# Patient Record
Sex: Female | Born: 1975
Health system: Southern US, Community
[De-identification: ages and names within clinical notes are randomized; demographics above are authoritative.]

## PROBLEM LIST (undated history)

## (undated) ENCOUNTER — Ambulatory Visit (HOSPITAL_COMMUNITY): Disposition: A | Discharge status: Other Acute Inpatient Hospital | Payer: Medicaid Other

## (undated) ENCOUNTER — Inpatient Hospital Stay (HOSPITAL_COMMUNITY): Payer: BC Managed Care – PPO

## (undated) DIAGNOSIS — M25519 Pain in unspecified shoulder: Secondary | ICD-10-CM

## (undated) DIAGNOSIS — R259 Unspecified abnormal involuntary movements: Secondary | ICD-10-CM

## (undated) DIAGNOSIS — E119 Type 2 diabetes mellitus without complications: Secondary | ICD-10-CM

## (undated) DIAGNOSIS — M2550 Pain in unspecified joint: Secondary | ICD-10-CM

## (undated) DIAGNOSIS — E785 Hyperlipidemia, unspecified: Secondary | ICD-10-CM

## (undated) DIAGNOSIS — M25569 Pain in unspecified knee: Secondary | ICD-10-CM

## (undated) DIAGNOSIS — G894 Chronic pain syndrome: Secondary | ICD-10-CM

## (undated) DIAGNOSIS — M25579 Pain in unspecified ankle and joints of unspecified foot: Secondary | ICD-10-CM

## (undated) DIAGNOSIS — K5289 Other specified noninfective gastroenteritis and colitis: Secondary | ICD-10-CM

## (undated) DIAGNOSIS — IMO0001 Reserved for inherently not codable concepts without codable children: Secondary | ICD-10-CM

## (undated) DIAGNOSIS — IMO0002 Reserved for concepts with insufficient information to code with codable children: Secondary | ICD-10-CM

## (undated) DIAGNOSIS — R5383 Other fatigue: Secondary | ICD-10-CM

## (undated) DIAGNOSIS — R000 Tachycardia, unspecified: Secondary | ICD-10-CM

## (undated) DIAGNOSIS — G43009 Migraine without aura, not intractable, without status migrainosus: Secondary | ICD-10-CM

## (undated) DIAGNOSIS — M545 Low back pain: Secondary | ICD-10-CM

## (undated) DIAGNOSIS — J309 Allergic rhinitis, unspecified: Secondary | ICD-10-CM

## (undated) DIAGNOSIS — K219 Gastro-esophageal reflux disease without esophagitis: Secondary | ICD-10-CM

## (undated) DIAGNOSIS — R0602 Shortness of breath: Secondary | ICD-10-CM

## (undated) DIAGNOSIS — R51 Headache: Secondary | ICD-10-CM

## (undated) DIAGNOSIS — H9209 Otalgia, unspecified ear: Secondary | ICD-10-CM

## (undated) DIAGNOSIS — M25469 Effusion, unspecified knee: Secondary | ICD-10-CM

## (undated) DIAGNOSIS — D509 Iron deficiency anemia, unspecified: Secondary | ICD-10-CM

## (undated) DIAGNOSIS — F909 Attention-deficit hyperactivity disorder, unspecified type: Secondary | ICD-10-CM

## (undated) DIAGNOSIS — M5412 Radiculopathy, cervical region: Secondary | ICD-10-CM

## (undated) DIAGNOSIS — M542 Cervicalgia: Secondary | ICD-10-CM

## (undated) DIAGNOSIS — R5381 Other malaise: Secondary | ICD-10-CM

## (undated) DIAGNOSIS — R030 Elevated blood-pressure reading, without diagnosis of hypertension: Secondary | ICD-10-CM

## (undated) DIAGNOSIS — R609 Edema, unspecified: Secondary | ICD-10-CM

## (undated) DIAGNOSIS — J392 Other diseases of pharynx: Secondary | ICD-10-CM

## (undated) DIAGNOSIS — J019 Acute sinusitis, unspecified: Secondary | ICD-10-CM

## (undated) DIAGNOSIS — E041 Nontoxic single thyroid nodule: Secondary | ICD-10-CM

## (undated) DIAGNOSIS — T783XXA Angioneurotic edema, initial encounter: Secondary | ICD-10-CM

## (undated) DIAGNOSIS — F319 Bipolar disorder, unspecified: Secondary | ICD-10-CM

## (undated) DIAGNOSIS — R42 Dizziness and giddiness: Secondary | ICD-10-CM

## (undated) HISTORY — DX: Reserved for inherently not codable concepts without codable children: IMO0001

## (undated) HISTORY — DX: Shortness of breath: R06.02

## (undated) HISTORY — DX: Low back pain: M54.5

## (undated) HISTORY — DX: Angioneurotic edema, initial encounter: T78.3XXA

## (undated) HISTORY — PX: OTHER SURGICAL HISTORY: SHX169

## (undated) HISTORY — DX: Other diseases of pharynx: J39.2

## (undated) HISTORY — DX: Other fatigue: R53.83

## (undated) HISTORY — DX: Bipolar disorder, unspecified: F31.9

## (undated) HISTORY — DX: Other specified noninfective gastroenteritis and colitis: K52.89

## (undated) HISTORY — DX: Migraine without aura, not intractable, without status migrainosus: G43.009

## (undated) HISTORY — DX: Cervicalgia: M54.2

## (undated) HISTORY — DX: Pain in unspecified knee: M25.569

## (undated) HISTORY — DX: Otalgia, unspecified ear: H92.09

## (undated) HISTORY — DX: Unspecified abnormal involuntary movements: R25.9

## (undated) HISTORY — DX: Pain in unspecified ankle and joints of unspecified foot: M25.579

## (undated) HISTORY — DX: Acute sinusitis, unspecified: J01.90

## (undated) HISTORY — DX: Allergic rhinitis, unspecified: J30.9

## (undated) HISTORY — DX: Gastro-esophageal reflux disease without esophagitis: K21.9

## (undated) HISTORY — DX: Chronic pain syndrome: G89.4

## (undated) HISTORY — DX: Other malaise: R53.81

## (undated) HISTORY — DX: Elevated blood-pressure reading, without diagnosis of hypertension: R03.0

## (undated) HISTORY — PX: VAGINAL HYSTERECTOMY: SUR661

## (undated) HISTORY — DX: Iron deficiency anemia, unspecified: D50.9

## (undated) HISTORY — DX: Tachycardia, unspecified: R00.0

## (undated) HISTORY — DX: Headache: R51

## (undated) HISTORY — DX: Reserved for concepts with insufficient information to code with codable children: IMO0002

## (undated) HISTORY — DX: Pain in unspecified shoulder: M25.519

## (undated) HISTORY — DX: Pain in unspecified joint: M25.50

## (undated) HISTORY — DX: Edema, unspecified: R60.9

## (undated) HISTORY — DX: Radiculopathy, cervical region: M54.12

## (undated) HISTORY — DX: Nontoxic single thyroid nodule: E04.1

## (undated) HISTORY — DX: Effusion, unspecified knee: M25.469

## (undated) HISTORY — DX: Type 2 diabetes mellitus without complications: E11.9

## (undated) HISTORY — DX: Hyperlipidemia, unspecified: E78.5

---

## 1998-01-07 ENCOUNTER — Inpatient Hospital Stay (HOSPITAL_COMMUNITY): Admission: AD | Admit: 1998-01-07 | Discharge: 1998-01-07 | Payer: Self-pay | Admitting: Obstetrics and Gynecology

## 1998-01-10 ENCOUNTER — Inpatient Hospital Stay (HOSPITAL_COMMUNITY): Admission: AD | Admit: 1998-01-10 | Discharge: 1998-01-10 | Payer: Self-pay | Admitting: Obstetrics & Gynecology

## 1998-02-15 ENCOUNTER — Inpatient Hospital Stay (HOSPITAL_COMMUNITY): Admission: AD | Admit: 1998-02-15 | Discharge: 1998-02-15 | Payer: Self-pay | Admitting: Obstetrics & Gynecology

## 1998-02-20 ENCOUNTER — Inpatient Hospital Stay (HOSPITAL_COMMUNITY): Admission: AD | Admit: 1998-02-20 | Discharge: 1998-02-23 | Payer: Self-pay | Admitting: Obstetrics & Gynecology

## 1998-02-24 ENCOUNTER — Inpatient Hospital Stay (HOSPITAL_COMMUNITY): Admission: AD | Admit: 1998-02-24 | Discharge: 1998-02-24 | Payer: Self-pay | Admitting: Obstetrics and Gynecology

## 1998-03-13 ENCOUNTER — Inpatient Hospital Stay (HOSPITAL_COMMUNITY): Admission: AD | Admit: 1998-03-13 | Discharge: 1998-03-13 | Payer: Self-pay | Admitting: Obstetrics and Gynecology

## 1998-03-15 ENCOUNTER — Inpatient Hospital Stay (HOSPITAL_COMMUNITY): Admission: AD | Admit: 1998-03-15 | Discharge: 1998-03-21 | Payer: Self-pay | Admitting: Obstetrics & Gynecology

## 1998-03-24 ENCOUNTER — Other Ambulatory Visit: Admission: RE | Admit: 1998-03-24 | Discharge: 1998-03-24 | Payer: Self-pay | Admitting: Obstetrics & Gynecology

## 1998-05-15 ENCOUNTER — Inpatient Hospital Stay (HOSPITAL_COMMUNITY): Admission: AD | Admit: 1998-05-15 | Discharge: 1998-05-15 | Payer: Self-pay | Admitting: *Deleted

## 1998-06-02 ENCOUNTER — Inpatient Hospital Stay (HOSPITAL_COMMUNITY): Admission: AD | Admit: 1998-06-02 | Discharge: 1998-06-02 | Payer: Self-pay | Admitting: Obstetrics

## 1998-07-16 ENCOUNTER — Emergency Department (HOSPITAL_COMMUNITY): Admission: EM | Admit: 1998-07-16 | Discharge: 1998-07-16 | Payer: Self-pay | Admitting: Emergency Medicine

## 1998-10-12 ENCOUNTER — Inpatient Hospital Stay (HOSPITAL_COMMUNITY): Admission: AD | Admit: 1998-10-12 | Discharge: 1998-10-12 | Payer: Self-pay | Admitting: Obstetrics and Gynecology

## 1998-10-29 ENCOUNTER — Other Ambulatory Visit: Admission: RE | Admit: 1998-10-29 | Discharge: 1998-10-29 | Payer: Self-pay | Admitting: *Deleted

## 1999-11-26 ENCOUNTER — Emergency Department (HOSPITAL_COMMUNITY): Admission: EM | Admit: 1999-11-26 | Discharge: 1999-11-26 | Payer: Self-pay | Admitting: Emergency Medicine

## 1999-12-04 ENCOUNTER — Inpatient Hospital Stay (HOSPITAL_COMMUNITY): Admission: AD | Admit: 1999-12-04 | Discharge: 1999-12-04 | Payer: Self-pay | Admitting: Obstetrics

## 2000-02-21 ENCOUNTER — Emergency Department (HOSPITAL_COMMUNITY): Admission: EM | Admit: 2000-02-21 | Discharge: 2000-02-21 | Payer: Self-pay | Admitting: Emergency Medicine

## 2000-02-27 ENCOUNTER — Other Ambulatory Visit: Admission: RE | Admit: 2000-02-27 | Discharge: 2000-02-27 | Payer: Self-pay | Admitting: Obstetrics

## 2000-05-11 ENCOUNTER — Encounter (INDEPENDENT_AMBULATORY_CARE_PROVIDER_SITE_OTHER): Payer: Self-pay | Admitting: Specialist

## 2000-05-11 ENCOUNTER — Encounter: Payer: Self-pay | Admitting: Orthopedic Surgery

## 2000-05-11 ENCOUNTER — Observation Stay (HOSPITAL_COMMUNITY): Admission: RE | Admit: 2000-05-11 | Discharge: 2000-05-12 | Payer: Self-pay | Admitting: Orthopedic Surgery

## 2000-06-05 ENCOUNTER — Emergency Department (HOSPITAL_COMMUNITY): Admission: EM | Admit: 2000-06-05 | Discharge: 2000-06-05 | Payer: Self-pay | Admitting: Emergency Medicine

## 2001-02-27 ENCOUNTER — Emergency Department (HOSPITAL_COMMUNITY): Admission: EM | Admit: 2001-02-27 | Discharge: 2001-02-27 | Payer: Self-pay | Admitting: Emergency Medicine

## 2001-03-22 ENCOUNTER — Other Ambulatory Visit: Admission: RE | Admit: 2001-03-22 | Discharge: 2001-03-22 | Payer: Self-pay | Admitting: Obstetrics and Gynecology

## 2001-04-23 ENCOUNTER — Encounter (INDEPENDENT_AMBULATORY_CARE_PROVIDER_SITE_OTHER): Payer: Self-pay | Admitting: Specialist

## 2001-04-23 ENCOUNTER — Other Ambulatory Visit: Admission: RE | Admit: 2001-04-23 | Discharge: 2001-04-23 | Payer: Self-pay | Admitting: Obstetrics and Gynecology

## 2001-05-19 ENCOUNTER — Emergency Department (HOSPITAL_COMMUNITY): Admission: EM | Admit: 2001-05-19 | Discharge: 2001-05-19 | Payer: Self-pay | Admitting: Emergency Medicine

## 2001-08-22 ENCOUNTER — Encounter: Payer: Self-pay | Admitting: Obstetrics and Gynecology

## 2001-08-22 ENCOUNTER — Ambulatory Visit (HOSPITAL_COMMUNITY): Admission: RE | Admit: 2001-08-22 | Discharge: 2001-08-22 | Payer: Self-pay | Admitting: Obstetrics and Gynecology

## 2001-10-21 ENCOUNTER — Inpatient Hospital Stay (HOSPITAL_COMMUNITY): Admission: AD | Admit: 2001-10-21 | Discharge: 2001-10-21 | Payer: Self-pay | Admitting: Obstetrics and Gynecology

## 2001-10-24 ENCOUNTER — Other Ambulatory Visit: Admission: RE | Admit: 2001-10-24 | Discharge: 2001-10-24 | Payer: Self-pay | Admitting: Obstetrics and Gynecology

## 2001-11-06 ENCOUNTER — Inpatient Hospital Stay (HOSPITAL_COMMUNITY): Admission: AD | Admit: 2001-11-06 | Discharge: 2001-11-06 | Payer: Self-pay | Admitting: Obstetrics and Gynecology

## 2001-11-21 ENCOUNTER — Encounter (HOSPITAL_COMMUNITY): Admission: RE | Admit: 2001-11-21 | Discharge: 2001-12-03 | Payer: Self-pay | Admitting: Obstetrics and Gynecology

## 2001-11-25 ENCOUNTER — Encounter (INDEPENDENT_AMBULATORY_CARE_PROVIDER_SITE_OTHER): Payer: Self-pay | Admitting: Specialist

## 2001-11-25 ENCOUNTER — Inpatient Hospital Stay (HOSPITAL_COMMUNITY): Admission: AD | Admit: 2001-11-25 | Discharge: 2001-12-02 | Payer: Self-pay | Admitting: Obstetrics and Gynecology

## 2001-11-26 ENCOUNTER — Encounter: Payer: Self-pay | Admitting: Obstetrics and Gynecology

## 2001-12-03 ENCOUNTER — Encounter: Admission: RE | Admit: 2001-12-03 | Discharge: 2002-01-02 | Payer: Self-pay | Admitting: Obstetrics and Gynecology

## 2002-01-14 ENCOUNTER — Other Ambulatory Visit: Admission: RE | Admit: 2002-01-14 | Discharge: 2002-01-14 | Payer: Self-pay | Admitting: Obstetrics and Gynecology

## 2002-05-01 ENCOUNTER — Emergency Department (HOSPITAL_COMMUNITY): Admission: EM | Admit: 2002-05-01 | Discharge: 2002-05-01 | Payer: Self-pay | Admitting: Emergency Medicine

## 2002-07-17 ENCOUNTER — Inpatient Hospital Stay (HOSPITAL_COMMUNITY): Admission: AD | Admit: 2002-07-17 | Discharge: 2002-07-17 | Payer: Self-pay | Admitting: *Deleted

## 2002-07-17 ENCOUNTER — Encounter: Payer: Self-pay | Admitting: Obstetrics and Gynecology

## 2002-09-26 ENCOUNTER — Emergency Department (HOSPITAL_COMMUNITY): Admission: EM | Admit: 2002-09-26 | Discharge: 2002-09-26 | Payer: Self-pay | Admitting: Emergency Medicine

## 2002-10-20 ENCOUNTER — Encounter: Payer: Self-pay | Admitting: Obstetrics and Gynecology

## 2002-10-20 ENCOUNTER — Ambulatory Visit (HOSPITAL_COMMUNITY): Admission: RE | Admit: 2002-10-20 | Discharge: 2002-10-20 | Payer: Self-pay | Admitting: Obstetrics and Gynecology

## 2003-02-13 ENCOUNTER — Inpatient Hospital Stay (HOSPITAL_COMMUNITY): Admission: AD | Admit: 2003-02-13 | Discharge: 2003-02-13 | Payer: Self-pay | Admitting: Obstetrics and Gynecology

## 2003-02-17 ENCOUNTER — Inpatient Hospital Stay (HOSPITAL_COMMUNITY): Admission: AD | Admit: 2003-02-17 | Discharge: 2003-02-17 | Payer: Self-pay | Admitting: Obstetrics and Gynecology

## 2003-02-23 ENCOUNTER — Inpatient Hospital Stay (HOSPITAL_COMMUNITY): Admission: AD | Admit: 2003-02-23 | Discharge: 2003-02-23 | Payer: Self-pay | Admitting: Obstetrics and Gynecology

## 2003-03-04 ENCOUNTER — Inpatient Hospital Stay (HOSPITAL_COMMUNITY): Admission: AD | Admit: 2003-03-04 | Discharge: 2003-03-07 | Payer: Self-pay | Admitting: Obstetrics and Gynecology

## 2003-06-26 ENCOUNTER — Other Ambulatory Visit: Admission: RE | Admit: 2003-06-26 | Discharge: 2003-06-26 | Payer: Self-pay | Admitting: Obstetrics and Gynecology

## 2003-09-04 ENCOUNTER — Emergency Department (HOSPITAL_COMMUNITY): Admission: EM | Admit: 2003-09-04 | Discharge: 2003-09-04 | Payer: Self-pay | Admitting: Emergency Medicine

## 2003-10-28 ENCOUNTER — Encounter: Admission: RE | Admit: 2003-10-28 | Discharge: 2004-01-26 | Payer: Self-pay | Admitting: Internal Medicine

## 2003-11-19 ENCOUNTER — Emergency Department (HOSPITAL_COMMUNITY): Admission: EM | Admit: 2003-11-19 | Discharge: 2003-11-19 | Payer: Self-pay | Admitting: Emergency Medicine

## 2004-01-22 ENCOUNTER — Encounter: Admission: RE | Admit: 2004-01-22 | Discharge: 2004-04-21 | Payer: Self-pay | Admitting: Internal Medicine

## 2004-02-12 ENCOUNTER — Emergency Department (HOSPITAL_COMMUNITY): Admission: EM | Admit: 2004-02-12 | Discharge: 2004-02-12 | Payer: Self-pay | Admitting: Emergency Medicine

## 2004-03-17 ENCOUNTER — Observation Stay (HOSPITAL_COMMUNITY): Admission: RE | Admit: 2004-03-17 | Discharge: 2004-03-18 | Payer: Self-pay | Admitting: Obstetrics and Gynecology

## 2004-03-17 ENCOUNTER — Encounter (INDEPENDENT_AMBULATORY_CARE_PROVIDER_SITE_OTHER): Payer: Self-pay | Admitting: *Deleted

## 2004-06-17 ENCOUNTER — Encounter: Admission: RE | Admit: 2004-06-17 | Discharge: 2004-09-15 | Payer: Self-pay | Admitting: Orthopedic Surgery

## 2004-06-28 ENCOUNTER — Emergency Department (HOSPITAL_COMMUNITY): Admission: EM | Admit: 2004-06-28 | Discharge: 2004-06-28 | Payer: Self-pay | Admitting: Emergency Medicine

## 2004-08-19 ENCOUNTER — Encounter: Admission: RE | Admit: 2004-08-19 | Discharge: 2004-08-19 | Payer: Self-pay | Admitting: Rheumatology

## 2005-05-13 ENCOUNTER — Inpatient Hospital Stay (HOSPITAL_COMMUNITY): Admission: AD | Admit: 2005-05-13 | Discharge: 2005-05-14 | Payer: Self-pay | Admitting: Obstetrics and Gynecology

## 2005-05-29 ENCOUNTER — Ambulatory Visit (HOSPITAL_COMMUNITY): Payer: Self-pay | Admitting: Psychiatry

## 2005-06-14 ENCOUNTER — Ambulatory Visit (HOSPITAL_COMMUNITY): Payer: Self-pay | Admitting: Psychiatry

## 2005-07-24 ENCOUNTER — Emergency Department (HOSPITAL_COMMUNITY): Admission: EM | Admit: 2005-07-24 | Discharge: 2005-07-24 | Payer: Self-pay | Admitting: Emergency Medicine

## 2005-08-07 ENCOUNTER — Ambulatory Visit: Payer: Self-pay | Admitting: Internal Medicine

## 2005-08-14 ENCOUNTER — Ambulatory Visit (HOSPITAL_COMMUNITY): Payer: Self-pay | Admitting: Psychiatry

## 2005-09-01 ENCOUNTER — Ambulatory Visit: Payer: Self-pay

## 2005-09-02 ENCOUNTER — Emergency Department (HOSPITAL_COMMUNITY): Admission: EM | Admit: 2005-09-02 | Discharge: 2005-09-02 | Payer: Self-pay | Admitting: Emergency Medicine

## 2005-09-08 ENCOUNTER — Ambulatory Visit: Payer: Self-pay | Admitting: Internal Medicine

## 2005-09-18 ENCOUNTER — Ambulatory Visit: Payer: Self-pay | Admitting: Internal Medicine

## 2005-09-29 ENCOUNTER — Ambulatory Visit (HOSPITAL_COMMUNITY): Payer: Self-pay | Admitting: Psychiatry

## 2005-10-30 ENCOUNTER — Ambulatory Visit (HOSPITAL_COMMUNITY): Payer: Self-pay | Admitting: Psychiatry

## 2005-12-13 ENCOUNTER — Ambulatory Visit: Payer: Self-pay | Admitting: Internal Medicine

## 2005-12-18 ENCOUNTER — Ambulatory Visit (HOSPITAL_COMMUNITY): Payer: Self-pay | Admitting: Psychiatry

## 2005-12-21 ENCOUNTER — Ambulatory Visit: Payer: Self-pay | Admitting: Pulmonary Disease

## 2005-12-25 ENCOUNTER — Ambulatory Visit: Payer: Self-pay | Admitting: Internal Medicine

## 2006-01-04 ENCOUNTER — Ambulatory Visit: Payer: Self-pay | Admitting: Pulmonary Disease

## 2006-01-10 ENCOUNTER — Ambulatory Visit (HOSPITAL_COMMUNITY): Payer: Self-pay | Admitting: Psychiatry

## 2006-01-17 ENCOUNTER — Ambulatory Visit (HOSPITAL_COMMUNITY): Payer: Self-pay | Admitting: Licensed Clinical Social Worker

## 2006-01-18 ENCOUNTER — Ambulatory Visit (HOSPITAL_BASED_OUTPATIENT_CLINIC_OR_DEPARTMENT_OTHER): Admission: RE | Admit: 2006-01-18 | Discharge: 2006-01-18 | Payer: Self-pay | Admitting: Pulmonary Disease

## 2006-01-23 ENCOUNTER — Ambulatory Visit: Payer: Self-pay | Admitting: Internal Medicine

## 2006-01-24 ENCOUNTER — Ambulatory Visit: Payer: Self-pay | Admitting: Internal Medicine

## 2006-01-30 ENCOUNTER — Ambulatory Visit: Payer: Self-pay | Admitting: Pulmonary Disease

## 2006-02-05 ENCOUNTER — Ambulatory Visit: Payer: Self-pay | Admitting: *Deleted

## 2006-02-05 ENCOUNTER — Ambulatory Visit: Payer: Self-pay | Admitting: Pulmonary Disease

## 2006-02-16 ENCOUNTER — Ambulatory Visit (HOSPITAL_COMMUNITY): Payer: Self-pay | Admitting: Psychiatry

## 2006-03-30 ENCOUNTER — Ambulatory Visit (HOSPITAL_COMMUNITY): Payer: Self-pay | Admitting: Psychiatry

## 2006-11-28 ENCOUNTER — Ambulatory Visit: Payer: Self-pay | Admitting: Internal Medicine

## 2006-12-03 ENCOUNTER — Encounter: Admission: RE | Admit: 2006-12-03 | Discharge: 2006-12-03 | Payer: Self-pay | Admitting: Internal Medicine

## 2006-12-07 ENCOUNTER — Ambulatory Visit: Payer: Self-pay | Admitting: Internal Medicine

## 2006-12-07 ENCOUNTER — Ambulatory Visit (HOSPITAL_COMMUNITY): Payer: Self-pay | Admitting: Psychiatry

## 2006-12-09 ENCOUNTER — Encounter: Admission: RE | Admit: 2006-12-09 | Discharge: 2006-12-09 | Payer: Self-pay | Admitting: Obstetrics & Gynecology

## 2006-12-14 ENCOUNTER — Ambulatory Visit: Payer: Self-pay | Admitting: Internal Medicine

## 2006-12-17 ENCOUNTER — Encounter: Payer: Self-pay | Admitting: Internal Medicine

## 2006-12-17 LAB — CONVERTED CEMR LAB
ALT: 22 units/L (ref 0–40)
AST: 23 units/L (ref 0–37)
Albumin: 3.8 g/dL (ref 3.5–5.2)
Alkaline Phosphatase: 79 units/L (ref 39–117)
Anti Nuclear Antibody(ANA): NEGATIVE
BUN: 10 mg/dL (ref 6–23)
Basophils Absolute: 0 10*3/uL (ref 0.0–0.1)
Basophils Relative: 0.8 % (ref 0.0–1.0)
Bilirubin, Direct: 0.1 mg/dL (ref 0.0–0.3)
CO2: 23 meq/L (ref 19–32)
Calcium: 9 mg/dL (ref 8.4–10.5)
Chloride: 109 meq/L (ref 96–112)
Cholesterol: 241 mg/dL (ref 0–200)
Creatinine, Ser: 0.6 mg/dL (ref 0.4–1.2)
Direct LDL: 181.5 mg/dL
Eosinophils Absolute: 0.1 10*3/uL (ref 0.0–0.6)
Eosinophils Relative: 1.6 % (ref 0.0–5.0)
Folate: 9.1 ng/mL
GFR calc Af Amer: 149 mL/min
GFR calc non Af Amer: 123 mL/min
Glucose, Bld: 169 mg/dL — ABNORMAL HIGH (ref 70–99)
HCT: 37 % (ref 36.0–46.0)
HDL: 45.1 mg/dL (ref >39.0)
Hemoglobin: 12 g/dL (ref 12.0–15.0)
Hgb A1c MFr Bld: 8.6 % — ABNORMAL HIGH (ref 4.6–6.0)
Lymphocytes Relative: 33.8 % (ref 12.0–46.0)
MCHC: 32.6 g/dL (ref 30.0–36.0)
MCV: 83.2 fL (ref 78.0–100.0)
Monocytes Absolute: 0.4 10*3/uL (ref 0.2–0.7)
Monocytes Relative: 8.7 % (ref 3.0–11.0)
Neutro Abs: 2.8 10*3/uL (ref 1.4–7.7)
Neutrophils Relative %: 55.1 % (ref 43.0–77.0)
Platelets: 266 10*3/uL (ref 150–400)
Potassium: 3.9 meq/L (ref 3.5–5.1)
RBC: 4.44 M/uL (ref 3.87–5.11)
RDW: 13.3 % (ref 11.5–14.6)
Rhuematoid fact SerPl-aCnc: 34.3 intl units/mL — ABNORMAL HIGH (ref 0.0–20.0)
Sed Rate: 18 mm/hr (ref 0–25)
Sodium: 140 meq/L (ref 135–145)
TSH: 1.43 microintl units/mL (ref 0.35–5.50)
Total Bilirubin: 0.8 mg/dL (ref 0.3–1.2)
Total CHOL/HDL Ratio: 5.3
Total Protein: 7.4 g/dL (ref 6.0–8.3)
Triglycerides: 120 mg/dL (ref 0–149)
VLDL: 24 mg/dL (ref 0–40)
Vitamin B-12: 477 pg/mL (ref 211–911)
WBC: 5 10*3/uL (ref 4.5–10.5)

## 2006-12-21 ENCOUNTER — Ambulatory Visit: Payer: Self-pay | Admitting: Internal Medicine

## 2006-12-23 ENCOUNTER — Emergency Department (HOSPITAL_COMMUNITY): Admission: EM | Admit: 2006-12-23 | Discharge: 2006-12-23 | Payer: Self-pay | Admitting: Family Medicine

## 2007-01-04 ENCOUNTER — Ambulatory Visit: Payer: Self-pay | Admitting: Internal Medicine

## 2007-01-11 ENCOUNTER — Ambulatory Visit (HOSPITAL_COMMUNITY): Payer: Self-pay | Admitting: Psychiatry

## 2007-02-04 ENCOUNTER — Ambulatory Visit: Payer: Self-pay | Admitting: Internal Medicine

## 2007-02-15 ENCOUNTER — Encounter: Admission: RE | Admit: 2007-02-15 | Discharge: 2007-02-15 | Payer: Self-pay | Admitting: Internal Medicine

## 2007-02-15 ENCOUNTER — Ambulatory Visit: Payer: Self-pay | Admitting: Internal Medicine

## 2007-02-21 HISTORY — PX: OTHER SURGICAL HISTORY: SHX169

## 2007-02-25 ENCOUNTER — Ambulatory Visit: Payer: Self-pay | Admitting: Internal Medicine

## 2007-02-28 ENCOUNTER — Encounter (INDEPENDENT_AMBULATORY_CARE_PROVIDER_SITE_OTHER): Payer: Self-pay | Admitting: Specialist

## 2007-02-28 ENCOUNTER — Other Ambulatory Visit: Admission: RE | Admit: 2007-02-28 | Discharge: 2007-02-28 | Payer: Self-pay | Admitting: Interventional Radiology

## 2007-02-28 ENCOUNTER — Encounter: Admission: RE | Admit: 2007-02-28 | Discharge: 2007-02-28 | Payer: Self-pay | Admitting: Internal Medicine

## 2007-03-04 ENCOUNTER — Emergency Department (HOSPITAL_COMMUNITY): Admission: EM | Admit: 2007-03-04 | Discharge: 2007-03-04 | Payer: Self-pay | Admitting: Emergency Medicine

## 2007-04-23 ENCOUNTER — Encounter
Admission: RE | Admit: 2007-04-23 | Discharge: 2007-07-22 | Payer: Self-pay | Admitting: Physical Medicine & Rehabilitation

## 2007-04-24 ENCOUNTER — Ambulatory Visit (HOSPITAL_COMMUNITY): Payer: Self-pay | Admitting: Psychiatry

## 2007-05-16 ENCOUNTER — Ambulatory Visit: Payer: Self-pay | Admitting: Endocrinology

## 2007-06-03 ENCOUNTER — Ambulatory Visit (HOSPITAL_COMMUNITY): Admission: RE | Admit: 2007-06-03 | Discharge: 2007-06-03 | Payer: Self-pay | Admitting: Endocrinology

## 2007-06-05 ENCOUNTER — Encounter: Payer: Self-pay | Admitting: *Deleted

## 2007-06-05 DIAGNOSIS — E119 Type 2 diabetes mellitus without complications: Secondary | ICD-10-CM

## 2007-06-05 HISTORY — DX: Type 2 diabetes mellitus without complications: E11.9

## 2007-06-18 ENCOUNTER — Ambulatory Visit: Payer: Self-pay | Admitting: Physical Medicine & Rehabilitation

## 2007-07-19 ENCOUNTER — Ambulatory Visit: Payer: Self-pay | Admitting: Internal Medicine

## 2007-07-24 ENCOUNTER — Emergency Department (HOSPITAL_COMMUNITY): Admission: EM | Admit: 2007-07-24 | Discharge: 2007-07-24 | Payer: Self-pay | Admitting: Emergency Medicine

## 2007-07-26 ENCOUNTER — Encounter: Admission: RE | Admit: 2007-07-26 | Discharge: 2007-07-26 | Payer: Self-pay | Admitting: Internal Medicine

## 2007-07-26 ENCOUNTER — Emergency Department (HOSPITAL_COMMUNITY): Admission: EM | Admit: 2007-07-26 | Discharge: 2007-07-26 | Payer: Self-pay | Admitting: Family Medicine

## 2007-08-15 ENCOUNTER — Encounter: Payer: Self-pay | Admitting: Internal Medicine

## 2007-08-16 ENCOUNTER — Encounter: Payer: Self-pay | Admitting: Internal Medicine

## 2007-09-13 ENCOUNTER — Ambulatory Visit: Payer: Self-pay | Admitting: Internal Medicine

## 2007-09-13 DIAGNOSIS — E785 Hyperlipidemia, unspecified: Secondary | ICD-10-CM

## 2007-09-13 DIAGNOSIS — F319 Bipolar disorder, unspecified: Secondary | ICD-10-CM

## 2007-09-13 DIAGNOSIS — R51 Headache: Secondary | ICD-10-CM | POA: Insufficient documentation

## 2007-09-13 DIAGNOSIS — E1169 Type 2 diabetes mellitus with other specified complication: Secondary | ICD-10-CM | POA: Insufficient documentation

## 2007-09-13 DIAGNOSIS — R519 Headache, unspecified: Secondary | ICD-10-CM | POA: Insufficient documentation

## 2007-09-13 HISTORY — DX: Headache: R51

## 2007-09-13 HISTORY — DX: Bipolar disorder, unspecified: F31.9

## 2007-09-13 HISTORY — DX: Hyperlipidemia, unspecified: E78.5

## 2007-09-23 ENCOUNTER — Ambulatory Visit: Payer: Self-pay | Admitting: Internal Medicine

## 2007-09-24 ENCOUNTER — Ambulatory Visit: Payer: Self-pay | Admitting: Physical Medicine & Rehabilitation

## 2007-09-24 ENCOUNTER — Encounter
Admission: RE | Admit: 2007-09-24 | Discharge: 2007-09-24 | Payer: Self-pay | Admitting: Physical Medicine & Rehabilitation

## 2007-09-25 ENCOUNTER — Telehealth: Payer: Self-pay | Admitting: Internal Medicine

## 2007-10-08 ENCOUNTER — Emergency Department (HOSPITAL_COMMUNITY): Admission: EM | Admit: 2007-10-08 | Discharge: 2007-10-08 | Payer: Self-pay | Admitting: Emergency Medicine

## 2007-10-09 ENCOUNTER — Telehealth: Payer: Self-pay | Admitting: Internal Medicine

## 2007-10-15 ENCOUNTER — Ambulatory Visit: Payer: Self-pay | Admitting: Internal Medicine

## 2007-10-15 DIAGNOSIS — M545 Low back pain, unspecified: Secondary | ICD-10-CM | POA: Insufficient documentation

## 2007-10-15 DIAGNOSIS — M542 Cervicalgia: Secondary | ICD-10-CM

## 2007-10-15 DIAGNOSIS — M79609 Pain in unspecified limb: Secondary | ICD-10-CM | POA: Insufficient documentation

## 2007-10-15 HISTORY — DX: Cervicalgia: M54.2

## 2007-10-15 HISTORY — DX: Low back pain, unspecified: M54.50

## 2007-11-22 ENCOUNTER — Encounter: Payer: Self-pay | Admitting: Internal Medicine

## 2007-11-24 ENCOUNTER — Encounter: Admission: RE | Admit: 2007-11-24 | Discharge: 2007-11-24 | Payer: Self-pay | Admitting: Orthopedic Surgery

## 2007-12-05 ENCOUNTER — Ambulatory Visit: Payer: Self-pay | Admitting: Internal Medicine

## 2007-12-05 DIAGNOSIS — R0602 Shortness of breath: Secondary | ICD-10-CM

## 2007-12-05 DIAGNOSIS — R000 Tachycardia, unspecified: Secondary | ICD-10-CM | POA: Insufficient documentation

## 2007-12-05 DIAGNOSIS — H9209 Otalgia, unspecified ear: Secondary | ICD-10-CM | POA: Insufficient documentation

## 2007-12-05 DIAGNOSIS — R5383 Other fatigue: Secondary | ICD-10-CM | POA: Insufficient documentation

## 2007-12-05 DIAGNOSIS — R5381 Other malaise: Secondary | ICD-10-CM

## 2007-12-05 HISTORY — DX: Tachycardia, unspecified: R00.0

## 2007-12-05 HISTORY — DX: Other fatigue: R53.83

## 2007-12-05 HISTORY — DX: Otalgia, unspecified ear: H92.09

## 2007-12-05 HISTORY — DX: Shortness of breath: R06.02

## 2007-12-05 HISTORY — DX: Other malaise: R53.81

## 2007-12-06 ENCOUNTER — Emergency Department (HOSPITAL_COMMUNITY): Admission: EM | Admit: 2007-12-06 | Discharge: 2007-12-06 | Payer: Self-pay | Admitting: Family Medicine

## 2007-12-11 ENCOUNTER — Emergency Department (HOSPITAL_COMMUNITY): Admission: EM | Admit: 2007-12-11 | Discharge: 2007-12-11 | Payer: Self-pay | Admitting: Emergency Medicine

## 2007-12-12 ENCOUNTER — Encounter: Payer: Self-pay | Admitting: Internal Medicine

## 2007-12-18 ENCOUNTER — Ambulatory Visit: Payer: Self-pay | Admitting: Cardiology

## 2007-12-18 ENCOUNTER — Encounter: Payer: Self-pay | Admitting: Internal Medicine

## 2007-12-18 ENCOUNTER — Ambulatory Visit: Payer: Self-pay

## 2008-01-02 ENCOUNTER — Ambulatory Visit: Payer: Self-pay | Admitting: Internal Medicine

## 2008-01-02 DIAGNOSIS — R259 Unspecified abnormal involuntary movements: Secondary | ICD-10-CM

## 2008-01-02 HISTORY — DX: Unspecified abnormal involuntary movements: R25.9

## 2008-01-03 ENCOUNTER — Ambulatory Visit: Payer: Self-pay | Admitting: Physical Medicine & Rehabilitation

## 2008-01-03 ENCOUNTER — Encounter
Admission: RE | Admit: 2008-01-03 | Discharge: 2008-01-06 | Payer: Self-pay | Admitting: Physical Medicine & Rehabilitation

## 2008-01-09 ENCOUNTER — Encounter: Admission: RE | Admit: 2008-01-09 | Discharge: 2008-01-09 | Payer: Self-pay | Admitting: Orthopedic Surgery

## 2008-01-12 ENCOUNTER — Encounter: Admission: RE | Admit: 2008-01-12 | Discharge: 2008-01-12 | Payer: Self-pay | Admitting: Orthopedic Surgery

## 2008-01-13 ENCOUNTER — Encounter: Payer: Self-pay | Admitting: Pulmonary Disease

## 2008-01-16 ENCOUNTER — Encounter: Admission: RE | Admit: 2008-01-16 | Discharge: 2008-01-16 | Payer: Self-pay | Admitting: Internal Medicine

## 2008-01-17 ENCOUNTER — Encounter: Payer: Self-pay | Admitting: Internal Medicine

## 2008-01-30 ENCOUNTER — Encounter: Payer: Self-pay | Admitting: Internal Medicine

## 2008-02-03 ENCOUNTER — Ambulatory Visit (HOSPITAL_COMMUNITY): Payer: Self-pay | Admitting: Psychiatry

## 2008-02-18 ENCOUNTER — Ambulatory Visit: Payer: Self-pay | Admitting: Cardiology

## 2008-02-27 ENCOUNTER — Encounter: Payer: Self-pay | Admitting: Internal Medicine

## 2008-03-12 ENCOUNTER — Encounter: Admission: RE | Admit: 2008-03-12 | Discharge: 2008-03-13 | Payer: Self-pay | Admitting: Orthopedic Surgery

## 2008-05-09 ENCOUNTER — Emergency Department (HOSPITAL_COMMUNITY): Admission: EM | Admit: 2008-05-09 | Discharge: 2008-05-09 | Payer: Self-pay | Admitting: Emergency Medicine

## 2008-06-11 ENCOUNTER — Emergency Department (HOSPITAL_COMMUNITY): Admission: EM | Admit: 2008-06-11 | Discharge: 2008-06-11 | Payer: Self-pay | Admitting: Emergency Medicine

## 2008-06-19 ENCOUNTER — Encounter
Admission: RE | Admit: 2008-06-19 | Discharge: 2008-06-19 | Payer: Self-pay | Admitting: Physical Medicine & Rehabilitation

## 2008-06-26 ENCOUNTER — Ambulatory Visit: Payer: Self-pay | Admitting: Internal Medicine

## 2008-06-26 DIAGNOSIS — IMO0001 Reserved for inherently not codable concepts without codable children: Secondary | ICD-10-CM

## 2008-06-26 DIAGNOSIS — K219 Gastro-esophageal reflux disease without esophagitis: Secondary | ICD-10-CM | POA: Insufficient documentation

## 2008-06-26 DIAGNOSIS — G43009 Migraine without aura, not intractable, without status migrainosus: Secondary | ICD-10-CM

## 2008-06-26 DIAGNOSIS — IMO0002 Reserved for concepts with insufficient information to code with codable children: Secondary | ICD-10-CM | POA: Insufficient documentation

## 2008-06-26 DIAGNOSIS — D509 Iron deficiency anemia, unspecified: Secondary | ICD-10-CM

## 2008-06-26 DIAGNOSIS — J309 Allergic rhinitis, unspecified: Secondary | ICD-10-CM

## 2008-06-26 DIAGNOSIS — M5412 Radiculopathy, cervical region: Secondary | ICD-10-CM | POA: Insufficient documentation

## 2008-06-26 DIAGNOSIS — M25579 Pain in unspecified ankle and joints of unspecified foot: Secondary | ICD-10-CM

## 2008-06-26 DIAGNOSIS — M797 Fibromyalgia: Secondary | ICD-10-CM

## 2008-06-26 HISTORY — DX: Reserved for inherently not codable concepts without codable children: IMO0001

## 2008-06-26 HISTORY — DX: Iron deficiency anemia, unspecified: D50.9

## 2008-06-26 HISTORY — DX: Pain in unspecified ankle and joints of unspecified foot: M25.579

## 2008-06-26 HISTORY — DX: Fibromyalgia: M79.7

## 2008-06-26 HISTORY — DX: Radiculopathy, cervical region: M54.12

## 2008-06-26 HISTORY — DX: Reserved for concepts with insufficient information to code with codable children: IMO0002

## 2008-06-26 HISTORY — DX: Migraine without aura, not intractable, without status migrainosus: G43.009

## 2008-06-26 HISTORY — DX: Gastro-esophageal reflux disease without esophagitis: K21.9

## 2008-06-26 HISTORY — DX: Allergic rhinitis, unspecified: J30.9

## 2008-07-01 ENCOUNTER — Emergency Department (HOSPITAL_COMMUNITY): Admission: EM | Admit: 2008-07-01 | Discharge: 2008-07-01 | Payer: Self-pay | Admitting: Emergency Medicine

## 2008-07-03 ENCOUNTER — Ambulatory Visit: Payer: Self-pay | Admitting: Internal Medicine

## 2008-07-03 DIAGNOSIS — J392 Other diseases of pharynx: Secondary | ICD-10-CM

## 2008-07-03 DIAGNOSIS — M25519 Pain in unspecified shoulder: Secondary | ICD-10-CM

## 2008-07-03 HISTORY — DX: Other diseases of pharynx: J39.2

## 2008-07-03 HISTORY — DX: Pain in unspecified shoulder: M25.519

## 2008-07-05 ENCOUNTER — Encounter: Admission: RE | Admit: 2008-07-05 | Discharge: 2008-07-05 | Payer: Self-pay | Admitting: Internal Medicine

## 2008-11-20 ENCOUNTER — Ambulatory Visit: Payer: Self-pay | Admitting: Endocrinology

## 2008-11-20 DIAGNOSIS — E041 Nontoxic single thyroid nodule: Secondary | ICD-10-CM

## 2008-11-20 HISTORY — DX: Nontoxic single thyroid nodule: E04.1

## 2008-11-20 LAB — CONVERTED CEMR LAB: TSH: 1.41 microintl units/mL (ref 0.35–5.50)

## 2008-11-23 ENCOUNTER — Telehealth (INDEPENDENT_AMBULATORY_CARE_PROVIDER_SITE_OTHER): Payer: Self-pay | Admitting: *Deleted

## 2008-11-23 DIAGNOSIS — R1310 Dysphagia, unspecified: Secondary | ICD-10-CM | POA: Insufficient documentation

## 2008-11-25 ENCOUNTER — Encounter: Admission: RE | Admit: 2008-11-25 | Discharge: 2008-11-25 | Payer: Self-pay | Admitting: Endocrinology

## 2008-12-01 ENCOUNTER — Ambulatory Visit (HOSPITAL_COMMUNITY): Admission: RE | Admit: 2008-12-01 | Discharge: 2008-12-01 | Payer: Self-pay | Admitting: Endocrinology

## 2008-12-09 ENCOUNTER — Encounter: Payer: Self-pay | Admitting: Internal Medicine

## 2008-12-15 ENCOUNTER — Ambulatory Visit: Payer: Self-pay | Admitting: Internal Medicine

## 2008-12-15 DIAGNOSIS — K5289 Other specified noninfective gastroenteritis and colitis: Secondary | ICD-10-CM | POA: Insufficient documentation

## 2008-12-15 HISTORY — DX: Other specified noninfective gastroenteritis and colitis: K52.89

## 2008-12-16 ENCOUNTER — Telehealth: Payer: Self-pay | Admitting: Internal Medicine

## 2008-12-16 ENCOUNTER — Telehealth (INDEPENDENT_AMBULATORY_CARE_PROVIDER_SITE_OTHER): Payer: Self-pay | Admitting: *Deleted

## 2009-01-06 ENCOUNTER — Ambulatory Visit: Payer: Self-pay | Admitting: Internal Medicine

## 2009-01-06 ENCOUNTER — Ambulatory Visit: Payer: Self-pay | Admitting: Cardiology

## 2009-01-06 DIAGNOSIS — R609 Edema, unspecified: Secondary | ICD-10-CM | POA: Insufficient documentation

## 2009-01-06 HISTORY — DX: Edema, unspecified: R60.9

## 2009-01-08 LAB — CONVERTED CEMR LAB: Hgb A1c MFr Bld: 10 % — ABNORMAL HIGH (ref 4.6–6.5)

## 2009-04-01 ENCOUNTER — Emergency Department (HOSPITAL_COMMUNITY): Admission: EM | Admit: 2009-04-01 | Discharge: 2009-04-01 | Payer: Self-pay | Admitting: Emergency Medicine

## 2009-04-14 ENCOUNTER — Emergency Department (HOSPITAL_COMMUNITY): Admission: EM | Admit: 2009-04-14 | Discharge: 2009-04-14 | Payer: Self-pay | Admitting: Emergency Medicine

## 2009-04-14 ENCOUNTER — Telehealth (INDEPENDENT_AMBULATORY_CARE_PROVIDER_SITE_OTHER): Payer: Self-pay | Admitting: *Deleted

## 2009-04-15 ENCOUNTER — Encounter: Admission: RE | Admit: 2009-04-15 | Discharge: 2009-04-15 | Payer: Self-pay | Admitting: Orthopedic Surgery

## 2009-05-10 ENCOUNTER — Ambulatory Visit: Payer: Self-pay | Admitting: Internal Medicine

## 2009-05-10 DIAGNOSIS — T783XXA Angioneurotic edema, initial encounter: Secondary | ICD-10-CM

## 2009-05-10 HISTORY — DX: Angioneurotic edema, initial encounter: T78.3XXA

## 2009-07-29 ENCOUNTER — Ambulatory Visit: Payer: Self-pay | Admitting: Internal Medicine

## 2009-07-29 DIAGNOSIS — M25469 Effusion, unspecified knee: Secondary | ICD-10-CM | POA: Insufficient documentation

## 2009-07-29 DIAGNOSIS — R030 Elevated blood-pressure reading, without diagnosis of hypertension: Secondary | ICD-10-CM | POA: Insufficient documentation

## 2009-07-29 DIAGNOSIS — J019 Acute sinusitis, unspecified: Secondary | ICD-10-CM | POA: Insufficient documentation

## 2009-07-29 HISTORY — DX: Acute sinusitis, unspecified: J01.90

## 2009-07-29 HISTORY — DX: Elevated blood-pressure reading, without diagnosis of hypertension: R03.0

## 2009-07-29 HISTORY — DX: Effusion, unspecified knee: M25.469

## 2009-07-30 ENCOUNTER — Encounter: Payer: Self-pay | Admitting: Internal Medicine

## 2009-11-22 ENCOUNTER — Ambulatory Visit: Payer: Self-pay | Admitting: Internal Medicine

## 2009-11-22 DIAGNOSIS — M549 Dorsalgia, unspecified: Secondary | ICD-10-CM | POA: Insufficient documentation

## 2010-02-05 ENCOUNTER — Inpatient Hospital Stay (HOSPITAL_COMMUNITY): Admission: AD | Admit: 2010-02-05 | Discharge: 2010-02-05 | Payer: Self-pay | Admitting: Obstetrics and Gynecology

## 2010-05-24 ENCOUNTER — Ambulatory Visit: Payer: Self-pay | Admitting: Internal Medicine

## 2010-07-06 ENCOUNTER — Ambulatory Visit: Payer: Self-pay | Admitting: Internal Medicine

## 2010-08-01 ENCOUNTER — Encounter: Payer: Self-pay | Admitting: Internal Medicine

## 2010-08-29 ENCOUNTER — Inpatient Hospital Stay (HOSPITAL_COMMUNITY): Admission: AD | Admit: 2010-08-29 | Discharge: 2010-08-29 | Payer: Self-pay | Admitting: Obstetrics & Gynecology

## 2010-11-13 ENCOUNTER — Encounter: Payer: Self-pay | Admitting: Endocrinology

## 2010-11-14 ENCOUNTER — Ambulatory Visit: Admit: 2010-11-14 | Payer: Self-pay | Admitting: Internal Medicine

## 2010-11-20 LAB — CONVERTED CEMR LAB
ALT: 36 units/L — ABNORMAL HIGH (ref 0–35)
ALT: 41 units/L — ABNORMAL HIGH (ref 0–35)
AST: 22 units/L (ref 0–37)
AST: 27 units/L (ref 0–37)
Albumin: 4.1 g/dL (ref 3.5–5.2)
Albumin: 4.1 g/dL (ref 3.5–5.2)
Alkaline Phosphatase: 100 units/L (ref 39–117)
Alkaline Phosphatase: 95 units/L (ref 39–117)
BUN: 11 mg/dL (ref 6–23)
BUN: 8 mg/dL (ref 6–23)
Basophils Absolute: 0 10*3/uL (ref 0.0–0.1)
Basophils Absolute: 0 10*3/uL (ref 0.0–0.1)
Basophils Relative: 0.4 % (ref 0.0–3.0)
Basophils Relative: 0.5 % (ref 0.0–1.0)
Bilirubin Urine: NEGATIVE
Bilirubin, Direct: 0.1 mg/dL (ref 0.0–0.3)
Bilirubin, Direct: 0.3 mg/dL (ref 0.0–0.3)
CO2: 26 meq/L (ref 19–32)
CO2: 31 meq/L (ref 19–32)
Calcium: 9.3 mg/dL (ref 8.4–10.5)
Calcium: 9.9 mg/dL (ref 8.4–10.5)
Chloride: 102 meq/L (ref 96–112)
Chloride: 103 meq/L (ref 96–112)
Cholesterol: 264 mg/dL — ABNORMAL HIGH (ref 0–200)
Creatinine, Ser: 0.5 mg/dL (ref 0.4–1.2)
Creatinine, Ser: 0.7 mg/dL (ref 0.4–1.2)
Direct LDL: 185.2 mg/dL
Eosinophils Absolute: 0.1 10*3/uL (ref 0.0–0.7)
Eosinophils Absolute: 0.2 10*3/uL (ref 0.0–0.6)
Eosinophils Relative: 2.4 % (ref 0.0–5.0)
Eosinophils Relative: 3.1 % (ref 0.0–5.0)
GFR calc Af Amer: 124 mL/min
GFR calc non Af Amer: 102 mL/min
GFR calc non Af Amer: 180.94 mL/min (ref >60)
Glucose, Bld: 185 mg/dL — ABNORMAL HIGH (ref 70–99)
Glucose, Bld: 203 mg/dL — ABNORMAL HIGH (ref 70–99)
HCT: 38 % (ref 36.0–46.0)
HCT: 42.4 % (ref 36.0–46.0)
HDL: 53.9 mg/dL (ref >39.00)
Hemoglobin, Urine: NEGATIVE
Hemoglobin: 12.8 g/dL (ref 12.0–15.0)
Hemoglobin: 13.6 g/dL (ref 12.0–15.0)
Ketones, ur: NEGATIVE mg/dL
Lymphocytes Relative: 25.4 % (ref 12.0–46.0)
Lymphocytes Relative: 29.6 % (ref 12.0–46.0)
Lymphs Abs: 1.8 10*3/uL (ref 0.7–4.0)
MCHC: 32.2 g/dL (ref 30.0–36.0)
MCHC: 33.7 g/dL (ref 30.0–36.0)
MCV: 84.5 fL (ref 78.0–100.0)
MCV: 86.2 fL (ref 78.0–100.0)
Monocytes Absolute: 0.4 10*3/uL (ref 0.2–0.7)
Monocytes Absolute: 0.6 10*3/uL (ref 0.1–1.0)
Monocytes Relative: 10.3 % (ref 3.0–12.0)
Monocytes Relative: 5.3 % (ref 3.0–11.0)
Neutro Abs: 3.6 10*3/uL (ref 1.4–7.7)
Neutro Abs: 5 10*3/uL (ref 1.4–7.7)
Neutrophils Relative %: 57.3 % (ref 43.0–77.0)
Neutrophils Relative %: 65.7 % (ref 43.0–77.0)
Nitrite: NEGATIVE
Platelets: 292 10*3/uL (ref 150.0–400.0)
Platelets: 324 10*3/uL (ref 150–400)
Potassium: 3.7 meq/L (ref 3.5–5.1)
Potassium: 4.4 meq/L (ref 3.5–5.1)
RBC: 4.5 M/uL (ref 3.87–5.11)
RBC: 4.92 M/uL (ref 3.87–5.11)
RDW: 12.6 % (ref 11.5–14.6)
RDW: 12.8 % (ref 11.5–14.6)
Sodium: 136 meq/L (ref 135–145)
Sodium: 140 meq/L (ref 135–145)
Specific Gravity, Urine: 1.025 (ref 1.000–1.030)
TSH: 0.58 microintl units/mL (ref 0.35–5.50)
TSH: 1.04 microintl units/mL (ref 0.35–5.50)
Total Bilirubin: 0.8 mg/dL (ref 0.3–1.2)
Total Bilirubin: 1 mg/dL (ref 0.3–1.2)
Total CHOL/HDL Ratio: 5
Total Protein, Urine: 30 mg/dL
Total Protein: 7.9 g/dL (ref 6.0–8.3)
Total Protein: 8 g/dL (ref 6.0–8.3)
Triglycerides: 150 mg/dL — ABNORMAL HIGH (ref 0.0–149.0)
Urine Glucose: NEGATIVE mg/dL
Urobilinogen, UA: 0.2 (ref 0.0–1.0)
VLDL: 30 mg/dL (ref 0.0–40.0)
WBC: 6.1 10*3/uL (ref 4.5–10.5)
WBC: 7.5 10*3/uL (ref 4.5–10.5)
pH: 6.5 (ref 5.0–8.0)

## 2010-11-21 ENCOUNTER — Ambulatory Visit
Admission: RE | Admit: 2010-11-21 | Discharge: 2010-11-21 | Payer: Self-pay | Source: Home / Self Care | Attending: Internal Medicine | Admitting: Internal Medicine

## 2010-11-21 DIAGNOSIS — M25569 Pain in unspecified knee: Secondary | ICD-10-CM | POA: Insufficient documentation

## 2010-11-21 DIAGNOSIS — M2550 Pain in unspecified joint: Secondary | ICD-10-CM

## 2010-11-21 HISTORY — DX: Pain in unspecified joint: M25.50

## 2010-11-21 HISTORY — DX: Pain in unspecified knee: M25.569

## 2010-11-22 NOTE — Consult Note (Signed)
Summary: [facility]  [facility]   Imported By: Maryln Gottron 03/03/2008 15:37:58  _____________________________________________________________________  External Attachment:    Type:   Image     Comment:   External Document

## 2010-11-22 NOTE — Assessment & Plan Note (Signed)
Summary: DR Sammuel Cooper PT/NO SLOT-SINUS PROBLEM--STC   Vital Signs:  Patient profile:   35 year old female Height:      65 inches (165.10 cm) Weight:      230.0 pounds (104.55 kg) O2 Sat:      98 % on Room air Temp:     97.9 degrees F (36.61 degrees C) oral Pulse rate:   92 / minute BP sitting:   124 / 92  (left arm) Cuff size:   large  Vitals Entered By: Orlan Leavens RMA (July 06, 2010 1:18 PM)  O2 Flow:  Room air CC: SINUS PROBLEM, URI symptoms Is Patient Diabetic? No Pain Assessment Patient in pain? no        Primary Care Provider:  Corwin Levins MD  CC:  SINUS PROBLEM and URI symptoms.  History of Present Illness:  URI Symptoms      This is a 35 year old woman who presents with URI symptoms.  The symptoms began 2 months ago.  The severity is described as moderate.  The patient reports nasal congestion, purulent nasal discharge, and dry cough, but denies sore throat, earache, and sick contacts.  Associated symptoms include low-grade fever (<100.5 degrees).  The patient denies stiff neck, dyspnea, wheezing, rash, vomiting, diarrhea, and use of an antipyretic.  The patient also reports itchy throat and headache.  The patient denies itchy watery eyes, sneezing, seasonal symptoms, response to antihistamine, muscle aches, and severe fatigue.  Risk factors for Strep sinusitis include poor response to decongestant.  The patient denies the following risk factors for Strep sinusitis: unilateral facial pain, unilateral nasal discharge, and tender adenopathy.    Current Medications (verified): 1)  Glucotrol Xl 10 Mg Xr24h-Tab (Glipizide) .Marland Kitchen.. 1 By Mouth Two Times A Day 2)  Cetirizine Hcl 10 Mg Tabs (Cetirizine Hcl) .Marland Kitchen.. 1 By Mouth Once Daily 3)  Cymbalta 60 Mg Cpep (Duloxetine Hcl) .Marland Kitchen.. 1 By Mouth Once Daily 4)  Vyvanse 70 Mg Caps (Lisdexamfetamine Dimesylate) .Marland Kitchen.. 1 By Mouth Once Daily 5)  Hydrochlorothiazide 25 Mg Tabs (Hydrochlorothiazide) .... 1/2 By Mouth Once Daily As Needed  Swelling 6)  Simvastatin 80 Mg Tabs (Simvastatin) .Marland Kitchen.. 1 By Mouth Once Daily 7)  Actoplus Met 15-500 Mg Tabs (Pioglitazone Hcl-Metformin Hcl) .... 2 By Mouth in The Am 8)  Meloxicam 15 Mg Tabs (Meloxicam) .Marland Kitchen.. 1po Once Daily As Needed 9)  Accu-Chek Aviva  Strp (Glucose Blood) .... Use Asd Two Times A Day 10)  Lancets  Misc (Lancets) .... Use Asd Two Times A Day 11)  Flexeril 5 Mg Tabs (Cyclobenzaprine Hcl) .Marland Kitchen.. 1 By Mouth Three Times A Day As Needed For Muscle Spasm 12)  Prednisone (Pak) 5 Mg Tabs (Prednisone) .... Take As Directed  Allergies (verified): 1)  ! Phenergan 2)  ! * Mri Contrast 3)  ! Sulfa  Past History:  Past Medical History: Diabetes mellitus, type II Hyperlipidemia Bipolar D/O Chronic low back pain/lumbar DDD cervical DDD Thyroid nodule Fibromyalgia migraine Allergic rhinitis chronic headaches   hx of pneumonia 1999 Anemia-iron deficiency GERD  Review of Systems  The patient denies chest pain, syncope, and peripheral edema.    Physical Exam  General:  alert and overweight-appearing.  , mild ill  Eyes:  vision grossly intact, pupils equal, and pupils round.   Ears:  bilat tm's mild red/hazy, sinus tender bilat Mouth:  pharyngeal erythema and fair dentition.   Lungs:  normal respiratory effort and normal breath sounds.   Heart:  normal rate and regular  rhythm.     Impression & Recommendations:  Problem # 1:  SINUSITIS- ACUTE-NOS (ICD-461.9)  use 10d augmentin and daily nasal steroid for inflammation control given recurrent symptoms  The following medications were removed from the medication list:    Cephalexin 500 Mg Caps (Cephalexin) .Marland Kitchen... 1 by mouth three times a day Her updated medication list for this problem includes:    Augmentin 875-125 Mg Tabs (Amoxicillin-pot clavulanate) .Marland Kitchen... 1 by mouth two times a day x 10d    Veramyst 27.5 Mcg/spray Susp (Fluticasone furoate) .Marland Kitchen... 1 spray each nostril  every morning  Instructed on treatment. Call if  symptoms persist or worsen.   Orders: Prescription Created Electronically [phone])  Complete Medication List: 1)  Glucotrol Xl 10 Mg Xr24h-tab (Glipizide) .Marland Kitchen.. 1 by mouth two times a day 2)  Cetirizine Hcl 10 Mg Tabs (Cetirizine hcl) .Marland Kitchen.. 1 by mouth once daily 3)  Cymbalta 60 Mg Cpep (Duloxetine hcl) .Marland Kitchen.. 1 by mouth once daily 4)  Vyvanse 70 Mg Caps (Lisdexamfetamine dimesylate) .Marland Kitchen.. 1 by mouth once daily 5)  Hydrochlorothiazide 25 Mg Tabs (Hydrochlorothiazide) .... 1/2 by mouth once daily as needed swelling 6)  Simvastatin 80 Mg Tabs (Simvastatin) .Marland Kitchen.. 1 by mouth once daily 7)  Actoplus Met 15-500 Mg Tabs (Pioglitazone hcl-metformin hcl) .... 2 by mouth in the am 8)  Meloxicam 15 Mg Tabs (Meloxicam) .Marland Kitchen.. 1po once daily as needed 9)  Accu-chek Aviva Strp (Glucose blood) .... Use asd two times a day 10)  Lancets Misc (Lancets) .... Use asd two times a day 11)  Flexeril 5 Mg Tabs (Cyclobenzaprine hcl) .Marland Kitchen.. 1 by mouth three times a day as needed for muscle spasm 12)  Prednisone (pak) 5 Mg Tabs (Prednisone) .... Take as directed 13)  Augmentin 875-125 Mg Tabs (Amoxicillin-pot clavulanate) .Marland Kitchen.. 1 by mouth two times a day x 10d 14)  Veramyst 27.5 Mcg/spray Susp (Fluticasone furoate) .Marland Kitchen.. 1 spray each nostril  every morning  Patient Instructions: 1)  it was good to see you today. 2)  antibiotics and steroid nasal spray for your sinus symptoms as discussed - your prescriptions have been electronically submitted to your pharmacy. Please take as directed. Contact our office if you believe you're having problems with the medication(s).  3)  Get plenty of rest, drink lots of clear liquids, and use Tylenol or Ibuprofen for fever and comfort. Return in 7-10 days if you're not better:sooner if you're feeling worse. Prescriptions: VERAMYST 27.5 MCG/SPRAY SUSP (FLUTICASONE FUROATE) 1 spray each nostril  every morning  #1 x 2   Entered and Authorized by:   Newt Lukes MD   Signed by:   Newt Lukes MD on 07/06/2010   Method used:   Electronically to        [facility] [address] (retail)       [phone]       [place], [place]  [address]       Ph: [phone]       Fax: [phone]   RxID:   [id number] AUGMENTIN 875-125 MG TABS (AMOXICILLIN-POT CLAVULANATE) 1 by mouth two times a day x 10d  #20 x 0   Entered and Authorized by:   Newt Lukes MD   Signed by:   Newt Lukes MD on 07/06/2010   Method used:   Electronically to        [facility] [address] (retail)       [phone]       [place], [place]  [address]       Ph: [phone]       Fax: [phone]   RxID:   [id number]

## 2010-11-22 NOTE — Progress Notes (Signed)
Summary: ear pain CT results  Medications Added CEFTIN 500 MG TABS (CEFUROXIME AXETIL) 1 bid       21  22  23  24  25   Phone Note Call from Patient Call back at [facility] Phone [phone]   Caller: Patient Summary of Call: Pt c/o ear pain. No pressure but pain to touch. Has had recent sinus congestion & drainage that you are aware of. She says that she feels like she has had a fever w/chills. Her drainage has become yellow-green. She is also requesting CT results Initial call taken by: Lamar Sprinkles,  September 25, 2007 4:55 PM  Follow-up for Phone Call        call pt - no definite sinusitis but mucosal thickening.  I would rec abx for 10 days.  Call in Ceftin 500mg  by mouth two times a day #20.  No refills. Follow-up by: Dondra Spry DO,  September 25, 2007 6:12 PM  Additional Follow-up for Phone Call Additional follow up Details #1::        Pt informed  Additional Follow-up by: Lamar Sprinkles,  September 25, 2007 6:17 PM    New/Updated Medications: CEFTIN 500 MG TABS (CEFUROXIME AXETIL) 1 bid   Prescriptions: CEFTIN 500 MG TABS (CEFUROXIME AXETIL) 1 bid  #20 x 0   Entered by:   Lamar Sprinkles   Authorized by:   Dondra Spry DO   Signed by:   Lamar Sprinkles on 09/25/2007   Method used:   Electronically sent to ...       [facility] [address]       [address]       [place], [place]  [address]       Ph: [phone]       Fax: [phone]   RxID:   [id number]
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
## 2010-11-22 NOTE — Consult Note (Signed)
[facility]   Imported By: Esmeralda Links D'jimraou 02/12/2008 12:55:12  _____________________________________________________________________  External Attachment:    Type:   Image     Comment:   External Document

## 2010-11-22 NOTE — Progress Notes (Signed)
Summary: possible sinus infection  Phone Note Call from Patient Call back at Home Phone [phone]   Caller: [phone] Call For: Corwin Levins MD Summary of Call: per Daphene Calamity call c/o headache in face area pressure around nose face cheek could this be a sinus infection should she be seen again? Initial call taken by: Shelbie Proctor,  December 16, 2008 9:24 AM  Follow-up for Phone Call        ok for zpack x 1- done escript Follow-up by: Corwin Levins MD,  December 16, 2008 9:44 AM  Additional Follow-up for Phone Call Additional follow up Details #1::        called pt to inform pt made aware Additional Follow-up by: Shelbie Proctor,  December 16, 2008 10:39 AM    New/Updated Medications: AZITHROMYCIN 250 MG TABS (AZITHROMYCIN) 2po qd for 1 day, then 1po qd for 4days, then stop   Prescriptions: AZITHROMYCIN 250 MG TABS (AZITHROMYCIN) 2po qd for 1 day, then 1po qd for 4days, then stop  #6 x 1   Entered and Authorized by:   Corwin Levins MD   Signed by:   Corwin Levins MD on 12/16/2008   Method used:   Electronically to        [facility] [address] (retail)       [phone]       [place], [place]  [address]       Ph: [phone]       Fax: [phone]   RxID:   [id number]

## 2010-11-22 NOTE — Assessment & Plan Note (Signed)
Summary: WEAKNESS ARND THE HEART AREA/PER PT/$50/PN   Vital Signs:  Patient profile:   35 year old female Height:      66 inches Weight:      238.38 pounds BMI:     38.61 O2 Sat:      97 % Temp:     96.5 degrees F oral Pulse rate:   120 / minute BP sitting:   136 / 72  (right arm) Cuff size:   large  Vitals Entered By: Windell Norfolk (January 06, 2009 1:33 PM) CC: wekaness feeling around heart and SOB   CC:  wekaness feeling around heart and SOB.  History of Present Illness: here with what pt states is a recurrent problem for her - this is hte first episode this year for her; has been little bit more energy today but 4 days ago had onset illness (missed school) with severe fatigue (like she just got done running a race), with exhaustion, sob, doe , hard to speak and walk even short distance even 15 ft at level ground; no CP, does not think she had wheezing or chest tightness, but so tired she has to lay down; has been feeling warm but no fever on the thermometer; yesterday did have some lightheaded as well after she bent forward to look at something; no palpitations; no cough, no falls or loss of conscoiusness; ; also denies orthopnea, pnd or worsening LE edema; whole episode assoc iwth anxiety although not sure if primary or secondary to the symptoms; hard to get to sleep when this happens; not really under more stress lately; no significant depressive symptoms; has been gaining some wt - not sure how much (chart she has actually lost 3 lbs overall since one year ago); has ongoing chronic pain to the left leg and ankle with some burning yesterday but overall not too much worse than usual; also had an episode of muscle spasm to the left lower back that is gone today; has seen 2 cardiologist for this in the past , the last being dr hochrein with [facility] - neg exam overall per pt; had EKG, echo, and stress test, heart monitor all neg per pt; has hx of preeclampsia with an episode of heavin ess to the  chest carrying her newborn after baby born 1999  Problems Prior to Update: 1)  Peripheral Edema  (ICD-782.3) 2)  Dyspnea  (ICD-786.05) 3)  Preventive Health Care  (ICD-V70.0) 4)  Gastroenteritis, Acute  (ICD-558.9) 5)  Dysphagia Unspecified  (ICD-787.20) 6)  Thyroid Nodule, Right  (ICD-241.0) 7)  Shoulder Pain, Bilateral  (ICD-719.41) 8)  Ankle Pain, Left  (ICD-719.47) 9)  Other Disease of Pharynx or Nasopharynx  (ICD-478.29) 10)  Gerd  (ICD-530.81) 11)  Anemia-iron Deficiency  (ICD-280.9) 12)  Allergic Rhinitis  (ICD-477.9) 13)  Common Migraine  (ICD-346.10) 14)  Ankle Pain, Left  (ICD-719.47) 15)  Lumbar Radiculopathy, Left  (ICD-724.4) 16)  Cervical Radiculopathy, Left  (ICD-723.4) 17)  Fibromyalgia  (ICD-729.1) 18)  Tremor  (ICD-781.0) 19)  Ear Pain, Right  (ICD-388.70) 20)  Tachycardia  (ICD-785.0) 21)  Fatigue  (ICD-780.79) 22)  Dyspnea  (ICD-786.05) 23)  Back Pain, Lumbar  (ICD-724.2) 24)  Neck Pain  (ICD-723.1) 25)  Leg Pain, Left  (ICD-729.5) 26)  Hyperlipidemia  (ICD-272.4) 27)  Bipolar Disorder Unspecified  (ICD-296.80) 28)  Headache  (ICD-784.0) 29)  Diabetes Mellitus, Type II  (ICD-250.00)  Medications Prior to Update: 1)  Topamax 100 Mg  Tabs (Topiramate) .... Take One Tab By Mouth  Mouth Two Times A Day 2)  Glucotrol Xl 10 Mg Xr24h-Tab (Glipizide) .Marland Kitchen.. 1 By Mouth Two Times A Day 3)  Cetirizine Hcl 10 Mg Tabs (Cetirizine Hcl) .Marland Kitchen.. 1 By Mouth Once Daily 4)  Cymbalta 60 Mg Cpep (Duloxetine Hcl) .Marland Kitchen.. 1 By Mouth Once Daily 5)  Vyvanse 50 Mg Caps (Lisdexamfetamine Dimesylate) .Marland Kitchen.. 1 By Mouth Daily 6)  Azithromycin 250 Mg Tabs (Azithromycin) .... 2po Qd For 1 Day, Then 1po Qd For 4days, Then Stop  Current Medications (verified): 1)  Glucotrol Xl 10 Mg Xr24h-Tab (Glipizide) .Marland Kitchen.. 1 By Mouth Two Times A Day 2)  Cetirizine Hcl 10 Mg Tabs (Cetirizine Hcl) .Marland Kitchen.. 1 By Mouth Once Daily 3)  Cymbalta 60 Mg Cpep (Duloxetine Hcl) .Marland Kitchen.. 1 By Mouth Once Daily 4)  Vyvanse 50 Mg  Caps (Lisdexamfetamine Dimesylate) .Marland Kitchen.. 1 By Mouth Daily 5)  Hydrochlorothiazide 25 Mg Tabs (Hydrochlorothiazide) .... 1/2 By Mouth Once Daily As Needed Swelling  Allergies (verified): 1)  ! Phenergan 2)  ! 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    Back Surgery - lumbar 2001    s/p c-section x 3     (06/26/2008)  Social History:    Married with 3 children    Never Smoked    Alcohol use-no    homemaker     (06/26/2008)  Risk Factors:    Alcohol Use: N/A    >5 drinks/d w/in last 3 months: N/A    Caffeine Use: N/A    Diet: N/A    Exercise: N/A  Risk Factors:    Smoking Status: never (10/15/2007)    Packs/Day: N/A    Cigars/wk: N/A    Pipe Use/wk: N/A    Cans of tobacco/wk: N/A    Passive Smoke Exposure: N/A  Review of Systems  The patient denies anorexia, fever, weight loss, weight gain, vision loss, decreased hearing, hoarseness, chest pain, syncope, peripheral edema, prolonged cough, headaches, hemoptysis, abdominal pain, melena, hematochezia, severe indigestion/heartburn, hematuria, incontinence, muscle weakness, suspicious skin lesions, transient blindness, difficulty walking, unusual weight change, abnormal bleeding, enlarged lymph nodes, and angioedema.         all otherwise negative   Physical Exam  General:  alert and overweight-appearing.   Head:  Normocephalic and atraumatic without obvious abnormalities. No apparent alopecia or balding. Eyes:  No corneal or conjunctival inflammation noted. EOMI. Perrla. Ears:  External ear exam  shows no significant lesions or deformities.  Otoscopic examination reveals clear canals, tympanic membranes are intact bilaterally without bulging, retraction, inflammation or discharge. Hearing is grossly normal bilaterally. Nose:  External nasal examination shows no deformity or inflammation. Nasal mucosa are pink and moist without lesions or exudates. Mouth:  Oral mucosa and oropharynx without lesions or exudates.  Teeth in good repair. Neck:  supple and no masses.   Lungs:  normal respiratory effort and normal breath sounds.   Heart:  normal rate and regular rhythm.  Abdomen:  soft, non-tender, and normal bowel sounds.   Msk:  normal ROM, no joint tenderness, and no joint swelling.   Extremities:  no edema, no ulcers  Neurologic:  cranial nerves II-XII intact and strength normal in all extremities.     Impression & Recommendations:  Problem # 1:  Preventive Health Care (ICD-V70.0)  Overall doing well, up to date, counseled on routine health concerns for screening and prevention, immunizations up to date or declined, labs ordered  Orders: TLB-BMP (Basic Metabolic Panel-BMET) (80048-METABOL) TLB-CBC Platelet - w/Differential (85025-CBCD) TLB-Hepatic/Liver Function Pnl (80076-HEPATIC) TLB-Lipid Panel (80061-LIPID) TLB-TSH (Thyroid Stimulating Hormone) (84443-TSH) TLB-Udip ONLY (81003-UDIP)  Problem # 2:  DYSPNEA (ICD-786.05)  ? anxiety vs other - will check ct angio chest and labs above, if neg would not pursue further w/u at this point, d/w pt - educated, reassured  Orders: Radiology Referral (Radiology)  Problem # 3:  PERIPHERAL EDEMA (ICD-782.3)  for hctz 25 1/2 once daily as needed   Her updated medication list for this problem includes:    Hydrochlorothiazide 25 Mg Tabs (Hydrochlorothiazide) .Marland Kitchen... 1/2 by mouth once daily as needed swelling  Complete Medication List: 1)  Glucotrol Xl 10 Mg Xr24h-tab (Glipizide) .Marland Kitchen.. 1 by mouth two times a day 2)  Cetirizine Hcl 10 Mg  Tabs (Cetirizine hcl) .Marland Kitchen.. 1 by mouth once daily 3)  Cymbalta 60 Mg Cpep (Duloxetine hcl) .Marland Kitchen.. 1 by mouth once daily 4)  Vyvanse 50 Mg Caps (Lisdexamfetamine dimesylate) .Marland Kitchen.. 1 by mouth daily 5)  Hydrochlorothiazide 25 Mg Tabs (Hydrochlorothiazide) .... 1/2 by mouth once daily as needed swelling  Patient Instructions: 1)  Please go to the Lab in the basement for your blood tests today 2)  Continue all medications that you may have been taking previously  3)  your EKG was normal 4)  You will be contacted about the referral(s) to: CT scan of the chest 5)  Please schedule a follow-up appointment in 1 year or sooner if needed Prescriptions: HYDROCHLOROTHIAZIDE 25 MG TABS (HYDROCHLOROTHIAZIDE) 1/2 by mouth once daily as needed swelling  #100 x 3   Entered and Authorized by:   Corwin Levins MD   Signed by:   Corwin Levins MD on 01/06/2009   Method used:   Print then Give to Patient   RxID:   [id number]

## 2010-11-22 NOTE — Miscellaneous (Signed)
Summary: Orders Update   Clinical Lists Changes  Orders: Added new Test order of T-Foot Left Min 3 Views [phone]) - Signed

## 2010-11-22 NOTE — Assessment & Plan Note (Signed)
Summary: REQ F/U APPT/THYROID NODULE/$50/CD   Vital Signs:  Patient Profile:   35 Years Old Female Height:     66 inches Weight:      242.6 pounds O2 Sat:      97 % O2 treatment:    Room Air Temp:     97.8 degrees F oral Pulse rate:   97 / minute BP sitting:   100 / 76  (left arm) Cuff size:   large  Pt. in pain?   no  Vitals Entered By: Orlan Leavens (November 20, 2008 1:40 PM)                  Chief Complaint:  FOLLOW-UP.  History of Present Illness: was last seen here 2008 for a right thyroid nodule.  she does not notice the nodule, but she has slight pain at the anterior neck    Prior Medications Reviewed Using: Patient Recall  Prior Medication List:  TOPAMAX 100 MG  TABS (TOPIRAMATE) TAke one tab by mouth mouth two times a day GLIMEPIRIDE 4 MG TABS (GLIMEPIRIDE) 1 by mouth two times a day INDERAL LA 80 MG  CP24 (PROPRANOLOL HCL) one by mouth qd MECLIZINE HCL 25 MG  TABS (MECLIZINE HCL) Take 1 tablet by mouth two times a day as needed VALIUM 2 MG  TABS (DIAZEPAM) one by mouth two times a day prn CETIRIZINE HCL 10 MG TABS (CETIRIZINE HCL) 1 by mouth once daily CYMBALTA 60 MG CPEP (DULOXETINE HCL) 1 by mouth once daily   Updated Prior Medication List: TOPAMAX 100 MG  TABS (TOPIRAMATE) TAke one tab by mouth mouth two times a day GLIMEPIRIDE 4 MG TABS (GLIMEPIRIDE) 1 by mouth two times a day INDERAL LA 80 MG  CP24 (PROPRANOLOL HCL) one by mouth qd MECLIZINE HCL 25 MG  TABS (MECLIZINE HCL) Take 1 tablet by mouth two times a day as needed VALIUM 2 MG  TABS (DIAZEPAM) one by mouth two times a day prn CETIRIZINE HCL 10 MG TABS (CETIRIZINE HCL) 1 by mouth once daily CYMBALTA 60 MG CPEP (DULOXETINE HCL) 1 by mouth once daily  Current Allergies (reviewed today): ! PHENERGAN ! * MRI CONTRAST  Past Medical History:    Reviewed history from 06/26/2008 and no changes required:       Diabetes mellitus, type II       Hyperlipidemia       Bipolar D/O       Chronic low back  pain/lumbar DDD       cervical DDD       Thyroid nodule       Fibromyalgia       migraine       Allergic rhinitis       chronic headaches       hx of pneumonia 1999       Anemia-iron deficiency       GERD   Family History:    Grandfather with emphysema    Grandmother with coronary artery disease.    mother with thyroid disease, allergies/asthma, DM    father with HTN, DM, elevated cholesterol    aunt with breast cancer and pancreatic cancer    uncle with lung and prostate cancer    mother has uncertain type of thyroid condition.    Review of Systems       still has slight difficulty swallowing (was noted in 2008)   Physical Exam  General:     obese.   Neck:  i cannot appreciate the nodule Additional Exam:     outside test results are reviewed:  02/15/07: thyroid US:   Solitary approximately 1 cm solid nodule in the medial aspect of the  lower pole  02/28/07: thyroid nodule cytology: THYROID, RIGHT, NEEDLE ASPIRATION, THIN PREP, SMEARS AND CELL   BLOCK: FINDINGS CONSISTENT WITH NON-NEOPLASTIC GOITER  12/05/07: FastTSH                   0.58 uIU/mL      today: FastTSH                   1.41 uIU/mL                Impression & Recommendations:  Problem # 1:  THYROID NODULE, RIGHT (ICD-241.0) very unlikely to be malignant   Patient Instructions: 1)  [name] 2)  if no significant change, only needs annual exam of thyroid and tsh 3)  ret here prn
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
## 2010-11-22 NOTE — Assessment & Plan Note (Signed)
Summary: FU ON BS AND BP/ $50 /NWS   Vital Signs:  Patient Profile:   35 Years Old Female Height:     66 inches Weight:      239 pounds Temp:     97.3 degrees F oral Pulse rate:   96 / minute BP sitting:   142 / 100  (right arm) Cuff size:   large  Vitals Entered By: Windell Norfolk (December 15, 2008 3:57 PM)                 Chief Complaint:  bp and blood sugar high over the weekend.  History of Present Illness: here with acute onset x 4 days headache, n/v (no blood), crampy abd pains and one day of watery diarrhea, fever but no shaking chills; at the same time has had some mild increased BS and BP with cbg's in the 160; denies polys or low sugars, good compliance with meds, overall wt stable     Updated Prior Medication List: TOPAMAX 100 MG  TABS (TOPIRAMATE) TAke one tab by mouth mouth two times a day GLUCOTROL XL 10 MG XR24H-TAB (GLIPIZIDE) 1 by mouth two times a day CETIRIZINE HCL 10 MG TABS (CETIRIZINE HCL) 1 by mouth once daily CYMBALTA 60 MG CPEP (DULOXETINE HCL) 1 by mouth once daily VYVANSE 50 MG CAPS (LISDEXAMFETAMINE DIMESYLATE) 1 by mouth daily  Current Allergies (reviewed today): ! PHENERGAN ! 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       Back Surgery - lumbar 2001       s/p c-section x 3   Social History:    Reviewed history from 06/26/2008 and no changes required:       Married with 3 children        Never Smoked       Alcohol use-no       homemaker    Review of Systems       all otherwise negative except it seems even before she became ill that the glimeparide just does not seem to be effective at reducing blood sugars whether she takes it or not   Physical Exam  General:     alert and overweight-appearing., mild ill  Head:     Normocephalic and atraumatic without obvious abnormalities. No apparent alopecia or balding. Eyes:     No corneal or conjunctival inflammation noted. EOMI. Perrla. Ears:     bilat tm's red, sinus nontender Nose:     nasal dischargemucosal pallor and mucosal erythema.   Mouth:     pharyngeal erythema and fair dentition.  Neck:     supple and no masses.   Lungs:     normal respiratory effort and normal breath sounds.   Heart:     normal rate and regular rhythm.   Abdomen:     soft. , mild diffuse tender, soft, normal bowel sounds, no distention, and no masses.   Extremities:     no edema, no ulcers     Impression & Recommendations:  Problem # 1:  GASTROENTERITIS, ACUTE (ICD-558.9)  The following medications were removed from the medication list:    Meclizine Hcl 25 Mg Tabs (Meclizine hcl) .Marland Kitchen... Take 1 tablet by mouth two times a day as needed d/w pt, likely viral, tx with phenergan as needed , drink more fluids, followup any worsening s/s, does not appear to be dehydrated at this pt  Problem # 2:  DIABETES MELLITUS, TYPE II (ICD-250.00)  Her updated medication list for this problem includes:    Glucotrol Xl 10 Mg Xr24h-tab (Glipizide) .Marland Kitchen... 1 by mouth two times a day ok to change from glimeparide  - if not working, may need to address further  Complete Medication List: 1)  Topamax 100 Mg Tabs (Topiramate) .... Take one tab by mouth mouth two times a day 2)  Glucotrol Xl 10 Mg Xr24h-tab (Glipizide) .Marland Kitchen.. 1 by mouth two times a day 3)  Cetirizine Hcl 10 Mg Tabs (Cetirizine hcl) .Marland Kitchen.. 1 by mouth once daily 4)  Cymbalta 60 Mg Cpep  (Duloxetine hcl) .Marland Kitchen.. 1 by mouth once daily 5)  Vyvanse 50 Mg Caps (Lisdexamfetamine dimesylate) .Marland Kitchen.. 1 by mouth daily   Patient Instructions: 1)  Continue all medications that you may have been taking previously , except stop the glimeparide  2)  start the glucotrol as prescribed instead 3)  drink more fluids as you can 4)  Please schedule a follow-up appointment as needed.   Prescriptions: GLUCOTROL XL 10 MG XR24H-TAB (GLIPIZIDE) 1 by mouth two times a day  #60 x 11   Entered and Authorized by:   Corwin Levins MD   Signed by:   Corwin Levins MD on 12/15/2008   Method used:   Print then Give to Patient   RxID:   [id number]

## 2010-11-22 NOTE — Assessment & Plan Note (Signed)
Summary: problems w/tongue/$50/cd   Vital Signs:  Patient Profile:   35 Years Old Female Height:     66 inches Weight:      239.4 pounds Temp:     97.0 degrees F oral Pulse rate:   93 / minute BP sitting:   130 / 294  (left arm) Cuff size:   regular  Vitals Entered By: Payton Spark CMA (July 03, 2008 10:30 AM)                 Chief Complaint:  problems with tongue.  History of Present Illness: here with frequent nasal allergy symptoms with clear congestions, and also wtih recurrant gagging it seems like something is inthe back of he throat, or the tongue "keeps rolling back," and she is frustrated, no GERD symptoms or cough; also woith ongoing left ankle sweling and pain clearly not on the right - was suggested she might have gout, but the swelling seems to be only to the left ant and lat area; walks with cane, plans to see podiatry regarding feet pain and possible orthotocis; also not very active - now with bilat shoulders with inability to abduct > 90 degrees except wqith severe pain, no trauma or bilat UE weakness, no neck pains    Updated Prior Medication List: TOPAMAX 100 MG  TABS (TOPIRAMATE) TAke one tab by mouth mouth two times a day GLIMEPIRIDE 4 MG TABS (GLIMEPIRIDE) 1 by mouth two times a day INDERAL LA 80 MG  CP24 (PROPRANOLOL HCL) one by mouth qd MECLIZINE HCL 25 MG  TABS (MECLIZINE HCL) Take 1 tablet by mouth two times a day as needed VALIUM 2 MG  TABS (DIAZEPAM) one by mouth two times a day prn CETIRIZINE HCL 10 MG TABS (CETIRIZINE HCL) 1 by mouth once daily CYMBALTA 60 MG CPEP (DULOXETINE HCL) 1 by mouth once daily  Current Allergies (reviewed today): ! PHENERGAN ! 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       Back Surgery - lumbar 2001       s/p c-section x 3   Social History:    Reviewed history from 06/26/2008 and no changes required:       Married with 3 children       Never Smoked       Alcohol use-no       homemaker    Review of Systems       all otherwise negative    Physical Exam  General:     alert and overweight-appearing.   Head:     Normocephalic and  atraumatic without obvious abnormalities. No apparent alopecia or balding. Eyes:     No corneal or conjunctival inflammation noted. EOMI. Perrla. Ears:     External ear exam shows no significant lesions or deformities.  Otoscopic examination reveals clear canals, tympanic membranes are intact bilaterally without bulging, retraction, inflammation or discharge. Hearing is grossly normal bilaterally. Nose:     nasal dischargemucosal pallor and mucosal erythema.   Mouth:     pharyngeal erythema and fair dentition.   Neck:     No deformities, masses, or tenderness noted. Lungs:     Normal respiratory effort, chest expands symmetrically. Lungs are clear to auscultation, no crackles or wheezes. Heart:     Normal rate and regular rhythm. S1 and S2 normal without gallop, murmur, click, rub or other extra sounds. Msk:     bilat shoulders NT but has marked reduced ROM to abduct 90 degress only bilat.; left ankle with tender and swelling to left anterolat aspect with some pain to dorsiflexion as well Extremities:     no edema, no ulcers     Impression & Recommendations:  Problem # 1:  ALLERGIC RHINITIS (ICD-477.9) add the zyrtec as needed  Her updated medication list for this problem includes:     Cetirizine Hcl 10 Mg Tabs (Cetirizine hcl) .Marland Kitchen... 1 by mouth once daily   Problem # 2:  OTHER DISEASE OF PHARYNX OR NASOPHARYNX (ICD-478.29) with freq gagging - I suspect related to above, consider ent eval  Problem # 3:  ANKLE PAIN, LEFT (ICD-719.47) ? tenosynovitis - to f/u with rheum with her next appt in a few wks per pt  Problem # 4:  SHOULDER PAIN, BILATERAL (ICD-719.41)  The following medications were removed from the medication list:    Endocet 10-650 Mg Tabs (Oxycodone-acetaminophen) ..... One by mouth every  6 hours as needed    Nabumetone 750 Mg Tabs (Nabumetone) .Marland Kitchen... Take 1 tablet by mouth two times a day  with food suspect frozen shoulder - she plans to f/.u with ortho at her next appt in a few wks  Complete Medication List: 1)  Topamax 100 Mg Tabs (Topiramate) .... Take one tab by mouth mouth two times a day 2)  Glimepiride 4 Mg Tabs (Glimepiride) .Marland Kitchen.. 1 by mouth two times a day 3)  Inderal La 80 Mg Cp24 (Propranolol hcl) .... One by mouth qd 4)  Meclizine Hcl 25 Mg Tabs (Meclizine hcl) .... Take 1 tablet by mouth two times a day as needed 5)  Valium 2 Mg Tabs (Diazepam) .... One by mouth two times a day prn 6)  Cetirizine Hcl 10 Mg Tabs (Cetirizine hcl) .Marland Kitchen.. 1 by mouth once daily 7)  Cymbalta 60 Mg Cpep (Duloxetine hcl) .Marland Kitchen.. 1 by mouth once daily   Patient Instructions: 1)  Please take all new medications as prescribed 2)  Continue all medications that you may have been taking previously 3)  Please schedule a follow-up appointment as needed.   Prescriptions: CETIRIZINE HCL 10 MG TABS (CETIRIZINE HCL) 1 by mouth once daily  #30 x 11   Entered and Authorized by:   Corwin Levins MD   Signed by:   Corwin Levins MD on 07/03/2008   Method used:   Print then Give to Patient   RxID:   [id number]  ]

## 2010-11-22 NOTE — Letter (Signed)
Summary: [facility]  [facility]   Imported By: Lennie Odor 08/08/2010 15:47:06  _____________________________________________________________________  External Attachment:    Type:   Image     Comment:   External Document

## 2010-11-22 NOTE — Letter (Signed)
[facility]   Imported By: Esmeralda Links D'jimraou 09/02/2007 15:57:02  _____________________________________________________________________  External Attachment:    Type:   Image     Comment:   External Document

## 2010-11-22 NOTE — Assessment & Plan Note (Signed)
Summary: SINUS INF?/CD   Vital Signs:  Patient profile:   35 year old female Height:      65 inches Weight:      238 pounds BMI:     39.75 O2 Sat:      98 % on Room air Temp:     97 degrees F oral Pulse rate:   85 / minute BP sitting:   120 / 84  (left arm) Cuff size:   large  Vitals Entered ByZella Ball Ewing (November 22, 2009 3:34 PM)  O2 Flow:  Room air  CC: sinus pressure/RE   CC:  sinus pressure/RE.  History of Present Illness: here for wellness, incidently with 2 days mild to mod facial pain, pressure, fever and greenish d/c, with slight ST, but Pt denies CP, sob, doe, wheezing, orthopnea, pnd, worsening LE edema, palps, dizziness or syncope Pt denies new neuro symptoms such as headache, facial or extremity weakness but has had left lower back pain , mild ot mod with some radiation to the left thigh but not below the knee, and no LE weakness or numbness and no bowel or bladder change, no falls., worse to get up from chair, started after bending and twisting to lift..  Pt denies polydipsia, polyuria, or low sugar symptoms such as shakiness improved with eating.  Overall good compliance with meds, trying to follow low chol, DM diet, wt stable, little excercise however   Problems Prior to Update: 1)  Back Pain  (ICD-724.5) 2)  Sinusitis- Acute-nos  (ICD-461.9) 3)  Joint Effusion, Right Knee  (ICD-719.06) 4)  Cervicalgia  (ICD-723.1) 5)  Elevated Bp Reading Without Dx Hypertension  (ICD-796.2) 6)  Sinusitis- Acute-nos  (ICD-461.9) 7)  Foot Pain, Left  (ICD-729.5) 8)  Angioedema  (ICD-995.1) 9)  Peripheral Edema  (ICD-782.3) 10)  Dyspnea  (ICD-786.05) 11)  Preventive Health Care  (ICD-V70.0) 12)  Gastroenteritis, Acute  (ICD-558.9) 13)  Dysphagia Unspecified  (ICD-787.20) 14)  Thyroid Nodule, Right  (ICD-241.0) 15)  Shoulder Pain, Bilateral  (ICD-719.41) 16)  Ankle Pain, Left  (ICD-719.47) 17)  Other Disease of Pharynx or Nasopharynx  (ICD-478.29) 18)  Gerd   (ICD-530.81) 19)  Anemia-iron Deficiency  (ICD-280.9) 20)  Allergic Rhinitis  (ICD-477.9) 21)  Common Migraine  (ICD-346.10) 22)  Ankle Pain, Left  (ICD-719.47) 23)  Lumbar Radiculopathy, Left  (ICD-724.4) 24)  Cervical Radiculopathy, Left  (ICD-723.4) 25)  Fibromyalgia  (ICD-729.1) 26)  Tremor  (ICD-781.0) 27)  Ear Pain, Right  (ICD-388.70) 28)  Tachycardia  (ICD-785.0) 29)  Fatigue  (ICD-780.79) 30)  Dyspnea  (ICD-786.05) 31)  Back Pain, Lumbar  (ICD-724.2) 32)  Neck Pain  (ICD-723.1) 33)  Leg Pain, Left  (ICD-729.5) 34)  Hyperlipidemia  (ICD-272.4) 35)  Bipolar Disorder Unspecified  (ICD-296.80) 36)  Headache  (ICD-784.0) 37)  Diabetes Mellitus, Type II  (ICD-250.00)  Medications Prior to Update: 1)  Glucotrol Xl 10 Mg Xr24h-Tab (Glipizide) .Marland Kitchen.. 1 By Mouth Two Times A Day 2)  Cetirizine Hcl 10 Mg Tabs (Cetirizine Hcl) .Marland Kitchen.. 1 By Mouth Once Daily 3)  Cymbalta 60 Mg Cpep (Duloxetine Hcl) .Marland Kitchen.. 1 By Mouth Once Daily 4)  Vyvanse 70 Mg Caps (Lisdexamfetamine Dimesylate) .Marland Kitchen.. 1 By Mouth Once Daily 5)  Hydrochlorothiazide 25 Mg Tabs (Hydrochlorothiazide) .... 1/2 By Mouth Once Daily As Needed Swelling 6)  Azithromycin 250 Mg Tabs (Azithromycin) .... 2po Qd For 1 Day, Then 1po Qd For 4days, Then Stop 7)  Simvastatin 80 Mg Tabs (Simvastatin) .Marland Kitchen.. 1 By Mouth Once Daily 8)  Actoplus Met 15-500 Mg Tabs (Pioglitazone Hcl-Metformin Hcl) .... 2 By Mouth in The Am 9)  Meloxicam 15 Mg Tabs (Meloxicam) .Marland Kitchen.. 1po Once Daily As Needed  Current Medications (verified): 1)  Glucotrol Xl 10 Mg Xr24h-Tab (Glipizide) .Marland Kitchen.. 1 By Mouth Two Times A Day 2)  Cetirizine Hcl 10 Mg Tabs (Cetirizine Hcl) .Marland Kitchen.. 1 By Mouth Once Daily 3)  Cymbalta 60 Mg Cpep (Duloxetine Hcl) .Marland Kitchen.. 1 By Mouth Once Daily 4)  Vyvanse 70 Mg Caps (Lisdexamfetamine Dimesylate) .Marland Kitchen.. 1 By Mouth Once Daily 5)  Hydrochlorothiazide 25 Mg Tabs (Hydrochlorothiazide) .... 1/2 By Mouth Once Daily As Needed Swelling 6)  Cephalexin 500 Mg Caps  (Cephalexin) .Marland Kitchen.. 1 By Mouth Three Times A Day 7)  Simvastatin 80 Mg Tabs (Simvastatin) .Marland Kitchen.. 1 By Mouth Once Daily 8)  Actoplus Met 15-500 Mg Tabs (Pioglitazone Hcl-Metformin Hcl) .... 2 By Mouth in The Am 9)  Meloxicam 15 Mg Tabs (Meloxicam) .Marland Kitchen.. 1po Once Daily As Needed 10)  Accu-Chek Aviva  Strp (Glucose Blood) .... Use Asd Two Times A Day 11)  Lancets  Misc (Lancets) .... Use Asd Two Times A Day 12)  Flexeril 5 Mg Tabs (Cyclobenzaprine Hcl) .Marland Kitchen.. 1 By Mouth Three Times A Day As Needed For Muscle Spasm  Allergies (verified): 1)  ! Phenergan 2)  ! * Mri Contrast 3)  ! 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 Back Surgery - lumbar 2001 s/p c-section x 3  Family History: Last updated: 11/20/2008 Grandfather with emphysema Grandmother with coronary artery disease. mother with thyroid disease, allergies/asthma, DM father with HTN, DM, elevated cholesterol aunt with breast cancer and pancreatic cancer uncle with lung and prostate cancer mother has uncertain type of thyroid condition.  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         all otherwise negative per pt -   Physical Exam  General:  alert and overweight-appearing.  , mild ill  Head:  normocephalic and atraumatic.   Eyes:  vision grossly intact, pupils equal, and pupils round.   Ears:  bilat tm's mild red, sinus tender bilat Nose:  nasal dischargemucosal pallor and mucosal erythema.   Mouth:  pharyngeal erythema and fair dentition.   Neck:  supple and no masses.   Lungs:  normal respiratory effort and normal breath sounds.   Heart:  normal rate and regular rhythm.   Abdomen:  soft, non-tender, and normal bowel sounds.   Msk:  no joint tenderness and no joint swelling.  , spine nontender; but there is some left lower paravertebral tender and spasm Extremities:  no edema, no erythema  Neurologic:  cranial nerves II-XII intact, strength normal in all extremities, and sensation intact to light touch.     Impression & Recommendations:  Problem #  1:  Preventive Health Care (ICD-V70.0)  Overall doing well, age appropriate education and counseling updated and referral for appropriate preventive services done unless declined, immunizations up to date or declined, diet counseling done if overweight, urged to quit smoking if smokes , most recent labs reviewed and current ordered if appropriate, ecg reviewed or declined (interpretation per ECG scanned in the EMR if done); information regarding Medicare Prevention requirements given if appropriate   Orders: TLB-BMP (Basic Metabolic Panel-BMET) (80048-METABOL) TLB-CBC Platelet - w/Differential (85025-CBCD) TLB-Hepatic/Liver Function Pnl (80076-HEPATIC) TLB-Lipid Panel (80061-LIPID) TLB-TSH (Thyroid Stimulating Hormone) (84443-TSH) TLB-Udip ONLY (81003-UDIP)  Problem # 2:  DIABETES MELLITUS, TYPE II (ICD-250.00)  Her updated medication list for this  problem includes:    Glucotrol Xl 10 Mg Xr24h-tab (Glipizide) .Marland Kitchen... 1 by mouth two times a day    Actoplus Met 15-500 Mg Tabs (Pioglitazone hcl-metformin hcl) .Marland Kitchen... 2 by mouth in the am  Orders: TLB-A1C / Hgb A1C (Glycohemoglobin) (83036-A1C) TLB-Microalbumin/Creat Ratio, Urine (82043-MALB) stable overall by hx and exam, ok to continue meds/tx as is , Pt to cont DM diet, excercise, wt loss efforts; to check labs today   Problem # 3:  SINUSITIS- ACUTE-NOS (ICD-461.9)  Her updated medication list for this problem includes:    Cephalexin 500 Mg Caps (Cephalexin) .Marland Kitchen... 1 by mouth three times a day treat as above, f/u any worsening signs or symptoms   Problem # 4:  BACK PAIN (ICD-724.5)  Her updated medication list for this problem includes:    Meloxicam 15 Mg Tabs (Meloxicam) .Marland Kitchen... 1po once daily as needed    Flexeril 5 Mg Tabs (Cyclobenzaprine hcl) .Marland Kitchen... 1 by mouth three times a day as needed for muscle spasm left lower lumbar paravertebral area  - c/w strain - treat as above, f/u any worsening signs or symptoms   Complete Medication List: 1)  Glucotrol Xl 10 Mg Xr24h-tab (Glipizide) .Marland Kitchen.. 1 by mouth two times a day 2)  Cetirizine Hcl 10 Mg Tabs (Cetirizine hcl) .Marland Kitchen.. 1 by mouth once daily 3)  Cymbalta 60 Mg Cpep (Duloxetine hcl) .Marland Kitchen.. 1 by mouth once daily 4)  Vyvanse 70 Mg Caps (Lisdexamfetamine dimesylate) .Marland Kitchen.. 1 by mouth once daily 5)  Hydrochlorothiazide 25 Mg Tabs (Hydrochlorothiazide) .... 1/2 by mouth once daily as needed swelling 6)  Cephalexin 500 Mg Caps (Cephalexin) .Marland Kitchen.. 1 by mouth three times a day 7)  Simvastatin 80 Mg Tabs (Simvastatin) .Marland Kitchen.. 1 by mouth once daily 8)  Actoplus Met 15-500 Mg Tabs (Pioglitazone hcl-metformin hcl) .... 2 by mouth in the am 9)  Meloxicam 15 Mg Tabs (Meloxicam) .Marland Kitchen.. 1po once daily as needed 10)  Accu-chek Aviva Strp (Glucose blood) .... Use asd two times a day 11)  Lancets Misc (Lancets) .... Use asd two times a day 12)  Flexeril 5 Mg Tabs  (Cyclobenzaprine hcl) .Marland Kitchen.. 1 by mouth three times a day as needed for muscle spasm  Other Orders: Tdap => 30yrs IM (16109) Pneumococcal Vaccine (60454) Admin 1st Vaccine (09811) Admin of Any Addtl Vaccine (91478)  Patient Instructions: 1)  you had the tetanus shot and pneumonia shots today 2)  you are given the new glucometer and strips today 3)  Please take all new medications as prescribed - the antibiotic, and muscle relaxer 4)  You can also use Mucinex OTC or it's generic for congestion  5)  Please go to the Lab in the basement for your blood and/or urine tests today  6)  Please schedule a follow-up appointment in 6 months with:  7)  BMP prior to visit, ICD-9: 250.02 8)  Lipid Panel prior to visit, ICD-9: 9)  HbgA1C prior to visit, ICD-9: Prescriptions: FLEXERIL 5 MG TABS (CYCLOBENZAPRINE HCL) 1 by mouth three times a day as needed for muscle spasm  #90 x 1   Entered and Authorized by:   Corwin Levins MD   Signed by:   Corwin Levins MD on 11/22/2009   Method used:   Print then Give to Patient   RxID:   [id number] LANCETS  MISC (LANCETS) use asd two times a day  #200 x 11   Entered and Authorized by:   Corwin Levins MD   Signed by:   Corwin Levins MD on 11/22/2009   Method used:   Print then Give to Patient   RxID:   [id number] ACCU-CHEK AVIVA  STRP (GLUCOSE BLOOD) use asd two times a day  #200 x 11   Entered and Authorized by:   Corwin Levins MD   Signed by:   Corwin Levins MD on 11/22/2009   Method used:   Print then Give to Patient   RxID:   [id number] CEPHALEXIN 500 MG CAPS (CEPHALEXIN) 1 by mouth three times a day  #30 x 0   Entered and Authorized by:   Corwin Levins MD   Signed by:   Corwin Levins MD on 11/22/2009   Method used:   Print then Give to Patient   RxID:   [id number]    Immunizations Administered:  Tetanus Vaccine:    Vaccine Type: Tdap    Site: right deltoid    Mfr: GlaxoSmithKline    Dose: 0.5 ml    Route: IM    Given by: Robin  Ewing    Exp. Date: 12/18/2011    Lot #: [id number]    VIS given: 09/10/07 version given November 22, 2009.  Pneumonia Vaccine:    Vaccine Type: Pneumovax    Site: left deltoid    Mfr: Merck    Dose: 0.5 ml    Route: IM    Given by: Robin Ewing    Exp. Date: 11/2010    Lot #: [id number]    VIS given: 05/20/96 version given November 22, 2009.

## 2010-11-22 NOTE — Consult Note (Signed)
Summary: [facility]  [facility]   Imported By: Esmeralda Links D'jimraou 12/25/2007 13:11:11  _____________________________________________________________________  External Attachment:    Type:   Image     Comment:   External Document

## 2010-11-22 NOTE — Progress Notes (Signed)
Summary: barium swallow test  Phone Note Call from Patient Call back at Home Phone [phone]   Caller: [phone] Call For: dr ellsion Summary of Call: per Shirley [name] call want Dr ellsion to order another barium swallow  test  having problems with her throat  still. Initial call taken by: Shelbie Proctor,  November 23, 2008 11:03 AM  Follow-up for Phone Call        ordered Follow-up by: Minus Breeding MD,  November 23, 2008 12:56 PM  Additional Follow-up for Phone Call Additional follow up Details #1::        called pt to inform pt made aware left msg on machine  Additional Follow-up by: Shelbie Proctor,  November 23, 2008 1:22 PM  New Problems: DYSPHAGIA UNSPECIFIED (ICD-787.20)   New Problems: DYSPHAGIA UNSPECIFIED (ICD-787.20)

## 2010-11-22 NOTE — Assessment & Plan Note (Signed)
Summary: cold,congestion/#/cd   Vital Signs:  Patient profile:   35 year old female Height:      66 inches Weight:      234 pounds BMI:     37.91 O2 Sat:      98 % Temp:     96.9 degrees F oral Pulse rate:   88 / minute BP sitting:   168 / 110  (left arm) Cuff size:   regular CC: Cold symptoms/ neck pain   CC:  Cold symptoms/ neck pain.  History of Present Illness: here with sinus pain and pressure with chest congestion with fever and prod cough greenish sputum mild to mod for 3 days;  Pt denies CP, sob, doe, wheezing, orthopnea, pnd, worsening LE edema, palps, dizziness or syncope   Has appt with ortho tomorrow, but also c/o multiple ortho compalints, including left foot pain to the end of the foot with persistnet pain and sweling near the 3-5 toes after strikin the foot on a wall that did not move;  also c/o 4 wks bilat neck, shoulder and arm pain moderate to severe with radiation at different times to the left and right hands assoc with movement of the head and neck to left, right, flexion and extension.;  also with right knee effusion and pain moderate for 2 wks with a click and severe more anterior sharp pains;  also mentions her BP is usually < 140/90 at home and possibly pain today causes incrsaed BP   Problems Prior to Update: 1)  Foot Pain, Left  (ICD-729.5) 2)  Angioedema  (ICD-995.1) 3)  Peripheral Edema  (ICD-782.3) 4)  Dyspnea  (ICD-786.05) 5)  Preventive Health Care  (ICD-V70.0) 6)  Gastroenteritis, Acute  (ICD-558.9) 7)  Dysphagia Unspecified  (ICD-787.20) 8)  Thyroid Nodule, Right  (ICD-241.0) 9)  Shoulder Pain, Bilateral  (ICD-719.41) 10)  Ankle Pain, Left  (ICD-719.47) 11)  Other Disease of Pharynx or Nasopharynx  (ICD-478.29) 12)  Gerd  (ICD-530.81) 13)  Anemia-iron Deficiency  (ICD-280.9) 14)  Allergic Rhinitis  (ICD-477.9) 15)  Common Migraine  (ICD-346.10) 16)  Ankle Pain, Left  (ICD-719.47) 17)  Lumbar Radiculopathy, Left  (ICD-724.4) 18)  Cervical  Radiculopathy, Left  (ICD-723.4) 19)  Fibromyalgia  (ICD-729.1) 20)  Tremor  (ICD-781.0) 21)  Ear Pain, Right  (ICD-388.70) 22)  Tachycardia  (ICD-785.0) 23)  Fatigue  (ICD-780.79) 24)  Dyspnea  (ICD-786.05) 25)  Back Pain, Lumbar  (ICD-724.2) 26)  Neck Pain  (ICD-723.1) 27)  Leg Pain, Left  (ICD-729.5) 28)  Hyperlipidemia  (ICD-272.4) 29)  Bipolar Disorder Unspecified  (ICD-296.80) 30)  Headache  (ICD-784.0) 31)  Diabetes Mellitus, Type II  (ICD-250.00)  Medications Prior to Update: 1)  Glucotrol Xl 10 Mg Xr24h-Tab (Glipizide) .Marland Kitchen.. 1 By Mouth Two Times A Day 2)  Cetirizine Hcl 10 Mg Tabs (Cetirizine Hcl) .Marland Kitchen.. 1 By Mouth Once Daily 3)  Cymbalta 60 Mg Cpep (Duloxetine Hcl) .Marland Kitchen.. 1 By Mouth Once Daily 4)  Vyvanse 70 Mg Caps (Lisdexamfetamine Dimesylate) .Marland Kitchen.. 1 By Mouth Once Daily 5)  Hydrochlorothiazide 25 Mg Tabs (Hydrochlorothiazide) .... 1/2 By Mouth Once Daily As Needed Swelling 6)  Ciprofloxacin Hcl 500 Mg Tabs (Ciprofloxacin Hcl) .Marland Kitchen.. 1 By Mouth Two Times A Day 7)  Simvastatin 80 Mg Tabs (Simvastatin) .Marland Kitchen.. 1 By Mouth Once Daily 8)  Actoplus Met 15-500 Mg Tabs (Pioglitazone Hcl-Metformin Hcl) .... 2 By Mouth in The Am 9)  Meloxicam 15 Mg Tabs (Meloxicam) .Marland Kitchen.. 1po Once Daily As Needed 10)  Prednisone 10 Mg  Tabs (Prednisone) .... 3po Qd For 3days, Then 2po Qd For 3days, Then 1po Qd For 3days, Then Stop 11)  Triamcinolone Acetonide 0.5 % Crea (Triamcinolone Acetonide) .... Use Asd Two Times A Day As Needed  Current Medications (verified): 1)  Glucotrol Xl 10 Mg Xr24h-Tab (Glipizide) .Marland Kitchen.. 1 By Mouth Two Times A Day 2)  Cetirizine Hcl 10 Mg Tabs (Cetirizine Hcl) .Marland Kitchen.. 1 By Mouth Once Daily 3)  Cymbalta 60 Mg Cpep (Duloxetine Hcl) .Marland Kitchen.. 1 By Mouth Once Daily 4)  Vyvanse 70 Mg Caps (Lisdexamfetamine Dimesylate) .Marland Kitchen.. 1 By Mouth Once Daily 5)  Hydrochlorothiazide 25 Mg Tabs (Hydrochlorothiazide) .... 1/2 By Mouth Once Daily As Needed Swelling 6)  Azithromycin 250 Mg Tabs (Azithromycin) ....  2po Qd For 1 Day, Then 1po Qd For 4days, Then Stop 7)  Simvastatin 80 Mg Tabs (Simvastatin) .Marland Kitchen.. 1 By Mouth Once Daily 8)  Actoplus Met 15-500 Mg Tabs (Pioglitazone Hcl-Metformin Hcl) .... 2 By Mouth in The Am 9)  Meloxicam 15 Mg Tabs (Meloxicam) .Marland Kitchen.. 1po Once Daily As Needed  Allergies (verified): 1)  ! Phenergan 2)  ! * Mri Contrast 3)  ! 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   Head:  normocephalic and atraumatic.   Eyes:  vision grossly intact, pupils equal, and pupils round.   Ears:  bilat tm's red, sinus tender bilat Nose:  no external deformity and no nasal discharge.   Mouth:  pharyngeal erythema and fair dentition.   Neck:  supple and no masses.   Lungs:  normal respiratory effort and normal breath sounds.   Heart:  normal rate and regular rhythm.   Msk:  tender bilat paracervical and trapezoid area, right knee with small effusion , FROM and NT;  left foot with trace swelling to 3-5 MTP areas with dorsal tenderness and bruising Extremities:  no edema, no erythema  Neurologic:  cranial nerves II-XII intact, strength normal in all extremities, and sensation intact to light touch.     Impression & Recommendations:  Problem # 1:  SINUSITIS-  ACUTE-NOS (ICD-461.9)  Orders: T-Toe(s) (73660TC)  Her updated medication list for this problem includes:    Azithromycin 250 Mg Tabs (Azithromycin) .Marland Kitchen... 2po qd for 1 day, then 1po qd for 4days, then stop treat as above, f/u any worsening signs or symptoms   Problem # 2:  ELEVATED BP READING WITHOUT DX HYPERTENSION (ICD-796.2)  Her updated medication list for this problem includes:    Hydrochlorothiazide 25 Mg Tabs (Hydrochlorothiazide) .Marland Kitchen... 1/2 by mouth once daily as needed swelling mild elev today, likely situational, ok to follow, continue same treatment   Problem # 3:  CERVICALGIA (ICD-723.1)  Her updated medication list for this problem includes:    Meloxicam 15 Mg Tabs (Meloxicam) .Marland Kitchen... 1po once daily as needed suspect DJD/DDD with possible radiculitis - to f/u ortho tomorrow  Problem # 4:  FOOT PAIN, LEFT (ICD-729.5)  Orders: T-Toe(s) (73660TC) traumatic, check films to r/o fx  Problem # 5:  JOINT EFFUSION, RIGHT KNEE (ICD-719.06) small, ? meniscal dz - also to f/u with ortho as planned tomorrow  Complete Medication List: 1)  Glucotrol Xl 10 Mg Xr24h-tab (Glipizide) .Marland Kitchen.. 1 by mouth two times a day 2)  Cetirizine Hcl 10 Mg Tabs (Cetirizine hcl) .Marland Kitchen.. 1 by mouth once daily 3)  Cymbalta 60 Mg Cpep (Duloxetine hcl) .Marland Kitchen.. 1 by mouth once daily 4)  Vyvanse 70 Mg Caps (Lisdexamfetamine dimesylate) .Marland Kitchen.. 1 by mouth once daily 5)  Hydrochlorothiazide 25 Mg Tabs (Hydrochlorothiazide) .... 1/2 by mouth once daily as needed swelling 6)  Azithromycin 250 Mg Tabs (Azithromycin) .... 2po qd for 1 day, then 1po qd for 4days, then stop 7)  Simvastatin 80 Mg Tabs (Simvastatin) .Marland Kitchen.. 1 by mouth once daily 8)  Actoplus Met 15-500 Mg Tabs (Pioglitazone hcl-metformin hcl) .... 2 by mouth in the am 9)  Meloxicam 15 Mg Tabs (Meloxicam) .Marland Kitchen.. 1po once daily as needed  Patient Instructions: 1)  Please take all new medications as prescribed 2)  Continue all previous medications as before this  visit  3)  Please go to Radiology in the basement level for your X-Ray today  4)  Please schedule a follow-up appointment as recommended at your last visit Prescriptions: AZITHROMYCIN 250 MG TABS (AZITHROMYCIN) 2po qd for 1 day, then 1po qd for 4days, then stop  #6 x 1   Entered and Authorized by:   Corwin Levins MD   Signed by:   Corwin Levins MD on 07/29/2009   Method used:   Print then Give to Patient   RxID:   [id number]
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
## 2010-11-22 NOTE — Progress Notes (Signed)
  Phone Note Call from Patient Call back at [phone]   Caller: Patient Summary of Call: seen yesterday with increased BP and likely respiratory virus Now having head and facial pressure Feels she may have another sinus infection that she is prone to No fever No SOB  P: I asked her to call the office after 8:30AM if she hadn't heard from Dr Jonny Ruiz to discuss whether further Rx is indicated Initial call taken by: Cindee Salt MD,  December 16, 2008 6:46 AM

## 2010-11-29 ENCOUNTER — Encounter: Payer: Self-pay | Admitting: Internal Medicine

## 2010-11-30 NOTE — Assessment & Plan Note (Signed)
Summary: left knee pain/cd   Vital Signs:  Patient profile:   35 year old female Height:      65 inches Weight:      227.38 pounds BMI:     37.97 O2 Sat:      98 % on Room air Temp:     98.8 degrees F oral Pulse rate:   109 / minute BP sitting:   100 / 60  (left arm) Cuff size:   large  Vitals Entered By: Zella Ball Ewing CMA (AAMA) (November 21, 2010 3:02 PM)  O2 Flow:  Room air CC: Left Knee Pain/RE   Primary Care Provider:  Corwin Levins MD  CC:  Left Knee Pain/RE.  History of Present Illness: here to f/u - c/o pain to the left knee, mow moderate , for approx 2 mo,  worse to walk up or down even a slight incline;  pain located usualy antiorly., sharp,  iwth radiation to the patellar tendon area, also some tenderness medially as well; pain and weakness have cuased almost to give away a few times, no falls,. husbnad thinks the knee is puffy and swollen off and on;  No fever,  night sweats, loss of appetite or other constitutional symptoms , in fact has lost some wt and doesnot feel this is a factor.  Walks for excercise, more often in the past few months such as kids to the [place] and around the [place];  sitting down seems to make it better; and seems to have to flex/extend the knee several times as the kneecap seems to get stuck;  denies catch or click but has "popped" several times.  Pt denies CP, worsening sob, doe, wheezing, orthopnea, pnd, worsening LE edema, palps, dizziness or syncope  Pt denies new neuro symptoms such as headache, facial or extremity weakness  Pt denies polydipsia, polyuria, or low sugar symptoms such as shakiness improved with eating.  Overall good compliance with meds, trying to follow low chol, DM diet, wt stable, little excercise however  CBG's in the lower 100's Does have also multijoint pain o/w as well without effusion for several months increased pain and asks fo Rheum referral  Preventive Screening-Counseling & Management      Drug Use:  no.     Problems Prior to Update: 1)  Knee Pain, Left  (ICD-719.46) 2)  Polyarthritis  (ICD-719.49) 3)  Back Pain  (ICD-724.5) 4)  Sinusitis- Acute-nos  (ICD-461.9) 5)  Joint Effusion, Right Knee  (ICD-719.06) 6)  Cervicalgia  (ICD-723.1) 7)  Elevated Bp Reading Without Dx Hypertension  (ICD-796.2) 8)  Sinusitis- Acute-nos  (ICD-461.9) 9)  Foot Pain, Left  (ICD-729.5) 10)  Angioedema  (ICD-995.1) 11)  Peripheral Edema  (ICD-782.3) 12)  Dyspnea  (ICD-786.05) 13)  Preventive Health Care  (ICD-V70.0) 14)  Gastroenteritis, Acute  (ICD-558.9) 15)  Dysphagia Unspecified  (ICD-787.20) 16)  Thyroid Nodule, Right  (ICD-241.0) 17)  Shoulder Pain, Bilateral  (ICD-719.41) 18)  Ankle Pain, Left  (ICD-719.47) 19)  Other Disease of Pharynx or Nasopharynx  (ICD-478.29) 20)  Gerd  (ICD-530.81) 21)  Anemia-iron Deficiency  (ICD-280.9) 22)  Allergic Rhinitis  (ICD-477.9) 23)  Common Migraine  (ICD-346.10) 24)  Ankle Pain, Left  (ICD-719.47) 25)  Lumbar Radiculopathy, Left  (ICD-724.4) 26)  Cervical Radiculopathy, Left  (ICD-723.4) 27)  Fibromyalgia  (ICD-729.1) 28)  Tremor  (ICD-781.0) 29)  Ear Pain, Right  (ICD-388.70) 30)  Tachycardia  (ICD-785.0) 31)  Fatigue  (ICD-780.79) 32)  Dyspnea  (ICD-786.05) 33)  Back Pain, Lumbar  (  ICD-724.2) 34)  Neck Pain  (ICD-723.1) 35)  Leg Pain, Left  (ICD-729.5) 36)  Hyperlipidemia  (ICD-272.4) 37)  Bipolar Disorder Unspecified  (ICD-296.80) 38)  Headache  (ICD-784.0) 39)  Diabetes Mellitus, Type II  (ICD-250.00)  Medications Prior to Update: 1)  Glucotrol Xl 10 Mg Xr24h-Tab (Glipizide) .Marland Kitchen.. 1 By Mouth Two Times A Day 2)  Cetirizine Hcl 10 Mg Tabs (Cetirizine Hcl) .Marland Kitchen.. 1 By Mouth Once Daily 3)  Cymbalta 60 Mg Cpep (Duloxetine Hcl) .Marland Kitchen.. 1 By Mouth Once Daily 4)  Vyvanse 70 Mg Caps (Lisdexamfetamine Dimesylate) .Marland Kitchen.. 1 By Mouth Once Daily 5)  Hydrochlorothiazide 25 Mg Tabs (Hydrochlorothiazide) .... 1/2 By Mouth Once Daily As Needed Swelling 6)  Simvastatin 80  Mg Tabs (Simvastatin) .Marland Kitchen.. 1 By Mouth Once Daily 7)  Actoplus Met 15-500 Mg Tabs (Pioglitazone Hcl-Metformin Hcl) .... 2 By Mouth in The Am 8)  Meloxicam 15 Mg Tabs (Meloxicam) .Marland Kitchen.. 1po Once Daily As Needed 9)  Accu-Chek Aviva  Strp (Glucose Blood) .... Use Asd Two Times A Day 10)  Lancets  Misc (Lancets) .... Use Asd Two Times A Day 11)  Flexeril 5 Mg Tabs (Cyclobenzaprine Hcl) .Marland Kitchen.. 1 By Mouth Three Times A Day As Needed For Muscle Spasm 12)  Prednisone (Pak) 5 Mg Tabs (Prednisone) .... Take As Directed 13)  Veramyst 27.5 Mcg/spray Susp (Fluticasone Furoate) .Marland Kitchen.. 1 Spray Each Nostril  Every Morning  Current Medications (verified): 1)  Glucotrol Xl 10 Mg Xr24h-Tab (Glipizide) .Marland Kitchen.. 1 By Mouth Two Times A Day 2)  Cetirizine Hcl 10 Mg Tabs (Cetirizine Hcl) .Marland Kitchen.. 1 By Mouth Once Daily 3)  Cymbalta 60 Mg Cpep (Duloxetine Hcl) .Marland Kitchen.. 1 By Mouth Once Daily 4)  Vyvanse 70 Mg Caps (Lisdexamfetamine Dimesylate) .Marland Kitchen.. 1 By Mouth Once Daily 5)  Hydrochlorothiazide 25 Mg Tabs (Hydrochlorothiazide) .... 1/2 By Mouth Once Daily As Needed Swelling 6)  Simvastatin 80 Mg Tabs (Simvastatin) .Marland Kitchen.. 1 By Mouth Once Daily 7)  Actoplus Met 15-500 Mg Tabs (Pioglitazone Hcl-Metformin Hcl) .... 2 By Mouth in The Am 8)  Meloxicam 15 Mg Tabs (Meloxicam) .Marland Kitchen.. 1po Once Daily As Needed 9)  Accu-Chek Aviva  Strp (Glucose Blood) .... Use Asd Two Times A Day 10)  Lancets  Misc (Lancets) .... Use Asd Two Times A Day 11)  Flexeril 5 Mg Tabs (Cyclobenzaprine Hcl) .Marland Kitchen.. 1 By Mouth Three Times A Day As Needed For Muscle Spasm 12)  Prednisone (Pak) 5 Mg Tabs (Prednisone) .... Take As Directed 13)  Veramyst 27.5 Mcg/spray Susp (Fluticasone Furoate) .Marland Kitchen.. 1 Spray Each Nostril  Every Morning  Allergies (verified): 1)  ! Phenergan 2)  ! * Mri Contrast 3)  ! 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 Back Surgery - lumbar 2001 s/p c-section x 3  Social History: Last updated: 11/21/2010 Married with 3 children Never Smoked Alcohol use-no homemaker Drug use-no  Risk Factors: Smoking Status: never (10/15/2007)  Social History: Married with 3 children Never Smoked Alcohol use-no homemaker Drug use-no Drug Use:  no  Review of Systems       all otherwise negative per pt -    Physical Exam  General:  alert and overweight-appearing.  Head:  normocephalic and atraumatic.   Eyes:  vision grossly intact, pupils equal, and pupils round.   Ears:  R ear normal and L ear normal.   Nose:  no external deformity and no nasal discharge.   Mouth:  no gingival abnormalities and pharynx pink and moist.   Neck:  supple and no masses.   Lungs:  normal respiratory effort and normal breath sounds.   Heart:  normal rate and regular rhythm.   Msk:  no joint tenderness and no joint swelling but left knee does have mild tenderness over the medial joint line   Extremities:  no edema, no erythema  Neurologic:  strength normal in all extremities and gait normal.     Impression & Recommendations:  Problem # 1:  KNEE PAIN, LEFT (ICD-719.46)  Her updated medication list for this problem includes:    Meloxicam 15 Mg Tabs (Meloxicam) .Marland Kitchen... 1po once daily as needed    Flexeril 5 Mg Tabs (Cyclobenzaprine hcl) .Marland Kitchen... 1 by mouth three times a day as needed for muscle spasm  Orders: Orthopedic Surgeon Referral (Ortho Surgeon) treat as above, f/u any worsening signs or symptoms , to refer ortho for further eval and tx, cant r/o meniscus tear  Problem # 2:  POLYARTHRITIS (ICD-719.49) ? etiology - for referral to  rheum per pt request Orders: Rheumatology Referral (Rheumatology)  Problem # 3:  DIABETES MELLITUS, TYPE II (ICD-250.00)  Her updated medication list for this problem includes:    Glucotrol Xl 10 Mg Xr24h-tab (Glipizide) .Marland Kitchen... 1 by mouth two times a day    Actoplus Met 15-500 Mg Tabs (Pioglitazone hcl-metformin hcl) .Marland Kitchen... 2 by mouth in the am  Orders: TLB-A1C / Hgb A1C (Glycohemoglobin) (83036-A1C) TLB-BMP (Basic Metabolic Panel-BMET) (80048-METABOL) TLB-Lipid Panel (80061-LIPID)  Labs Reviewed: Creat: 0.5 (01/06/2009)    Reviewed HgBA1c results: 10.0 (01/07/2009)  8.6 (12/17/2006) stable overall by hx and exam, ok to continue meds/tx as is   Problem # 4:  HYPERLIPIDEMIA (ICD-272.4)  Her updated medication list for this problem includes:    Simvastatin 80 Mg Tabs (Simvastatin) .Marland Kitchen... 1 by mouth once daily  Labs Reviewed: SGOT: 22 (01/06/2009)   SGPT: 41 (01/06/2009)   HDL:53.90 (01/06/2009), 45.1 (12/17/2006)  LDL:DEL (12/17/2006)  Chol:264 (01/06/2009), 241 (12/17/2006)  Trig:150.0 (01/06/2009), 120 (12/17/2006) stable overall by hx and exam, ok to continue meds/tx as is   Complete Medication List: 1)  Glucotrol Xl 10 Mg Xr24h-tab (Glipizide) .Marland Kitchen.. 1 by mouth two times a day 2)  Cetirizine Hcl 10 Mg Tabs (Cetirizine hcl) .Marland Kitchen.. 1 by mouth once daily 3)  Cymbalta 60 Mg Cpep (Duloxetine hcl) .Marland Kitchen.. 1 by mouth once daily 4)  Vyvanse 70 Mg Caps (Lisdexamfetamine dimesylate) .Marland Kitchen.. 1 by mouth once daily 5)  Hydrochlorothiazide 25 Mg Tabs (Hydrochlorothiazide) .... 1/2 by mouth once daily as needed swelling 6)  Simvastatin 80 Mg Tabs (Simvastatin) .Marland Kitchen.. 1 by mouth once daily 7)  Actoplus Met 15-500 Mg Tabs (Pioglitazone hcl-metformin hcl) .... 2 by mouth in the am 8)  Meloxicam 15 Mg Tabs (Meloxicam) .Marland Kitchen.. 1po once daily as needed 9)  Accu-chek Aviva Strp (Glucose blood) .... Use asd two times a day 10)  Lancets Misc (Lancets) .... Use asd two times a day 11)  Flexeril 5 Mg Tabs  (Cyclobenzaprine hcl) .Marland Kitchen.. 1 by mouth three times a day as needed for muscle spasm 12)  Prednisone (pak) 5 Mg Tabs (Prednisone) .... Take as directed 13)  Veramyst 27.5 Mcg/spray Susp (Fluticasone furoate) .Marland Kitchen.. 1 spray each nostril  every morning  Patient Instructions: 1)  You will be contacted about the referral(s) to: rheumatology - Dr Don Broach, and dr Darrelyn Hillock 2)  Please go to the Lab in the basement for your blood and/or urine tests today 3)  Please call the number on the [facility] Card for results of your testing  4)  Please schedule a follow-up appointment in 6 months for CPX with labs and : 5)  HbgA1C prior to visit, ICD-9: 250.02 6)  Urine Microalbumin prior to visit, ICD-9:   Orders Added: 1)  TLB-A1C / Hgb A1C (Glycohemoglobin) [83036-A1C] 2)  TLB-BMP (Basic Metabolic Panel-BMET) [80048-METABOL] 3)  TLB-Lipid Panel [80061-LIPID] 4)  Orthopedic Surgeon Referral [Ortho Surgeon] 5)  Rheumatology Referral [Rheumatology] 6)  Est. Patient Level IV [16109]

## 2010-12-14 NOTE — Letter (Signed)
Summary: [facility]   Imported By: Sherian Rein 12/08/2010 09:58:22  _____________________________________________________________________  External Attachment:    Type:   Image     Comment:   External Document

## 2010-12-29 NOTE — Consult Note (Signed)
Summary: Alben Deeds MD/[facility]  Alben Deeds MD/[facility]   Imported By: Lester Blue Ball 12/20/2010 10:40:10  _____________________________________________________________________  External Attachment:    Type:   Image     Comment:   External Document

## 2011-01-10 ENCOUNTER — Emergency Department (HOSPITAL_COMMUNITY)
Admission: EM | Admit: 2011-01-10 | Discharge: 2011-01-10 | Disposition: A | Discharge status: Home / Self Care | Payer: 59 | Attending: Emergency Medicine | Admitting: Emergency Medicine

## 2011-01-10 DIAGNOSIS — M436 Torticollis: Secondary | ICD-10-CM | POA: Insufficient documentation

## 2011-01-10 DIAGNOSIS — M5430 Sciatica, unspecified side: Secondary | ICD-10-CM | POA: Insufficient documentation

## 2011-01-10 DIAGNOSIS — M25569 Pain in unspecified knee: Secondary | ICD-10-CM | POA: Insufficient documentation

## 2011-01-10 DIAGNOSIS — M542 Cervicalgia: Secondary | ICD-10-CM | POA: Insufficient documentation

## 2011-01-10 LAB — WET PREP, GENITAL
Clue Cells Wet Prep HPF POC: NONE SEEN
Trich, Wet Prep: NONE SEEN

## 2011-01-30 LAB — CBC
HCT: 40.1 % (ref 36.0–46.0)
Hemoglobin: 13.3 g/dL (ref 12.0–15.0)
MCHC: 33.3 g/dL (ref 30.0–36.0)
MCV: 85.5 fL (ref 78.0–100.0)
Platelets: 308 10*3/uL (ref 150–400)
RBC: 4.69 MIL/uL (ref 3.87–5.11)
RDW: 14.1 % (ref 11.5–15.5)
WBC: 9.7 10*3/uL (ref 4.0–10.5)

## 2011-01-30 LAB — GLUCOSE, CAPILLARY: Glucose-Capillary: 215 mg/dL — ABNORMAL HIGH (ref 70–99)

## 2011-01-30 LAB — URINALYSIS, ROUTINE W REFLEX MICROSCOPIC
Bilirubin Urine: NEGATIVE
Glucose, UA: NEGATIVE mg/dL
Hgb urine dipstick: NEGATIVE
Ketones, ur: NEGATIVE mg/dL
Nitrite: NEGATIVE
Protein, ur: 30 mg/dL — AB
Specific Gravity, Urine: 1.027 (ref 1.005–1.030)
Urobilinogen, UA: 0.2 mg/dL (ref 0.0–1.0)
pH: 6 (ref 5.0–8.0)

## 2011-01-30 LAB — DIFFERENTIAL
Basophils Absolute: 0 10*3/uL (ref 0.0–0.1)
Basophils Relative: 0 % (ref 0–1)
Eosinophils Absolute: 0.1 10*3/uL (ref 0.0–0.7)
Eosinophils Relative: 1 % (ref 0–5)
Lymphocytes Relative: 27 % (ref 12–46)
Lymphs Abs: 2.7 10*3/uL (ref 0.7–4.0)
Monocytes Absolute: 0.9 10*3/uL (ref 0.1–1.0)
Monocytes Relative: 9 % (ref 3–12)
Neutro Abs: 6 10*3/uL (ref 1.7–7.7)
Neutrophils Relative %: 62 % (ref 43–77)

## 2011-01-30 LAB — RAPID URINE DRUG SCREEN, HOSP PERFORMED
Amphetamines: POSITIVE — AB
Barbiturates: NOT DETECTED
Benzodiazepines: POSITIVE — AB
Cocaine: NOT DETECTED
Opiates: NOT DETECTED
Tetrahydrocannabinol: NOT DETECTED

## 2011-01-30 LAB — URINE MICROSCOPIC-ADD ON

## 2011-01-30 LAB — PREGNANCY, URINE: Preg Test, Ur: NEGATIVE

## 2011-01-30 LAB — ETHANOL: Alcohol, Ethyl (B): <5 mg/dL (ref 0–10)

## 2011-03-07 NOTE — Consult Note (Signed)
[facility]                          ENDOCRINOLOGY CONSULTATION   Shirley, [name]                    MRN:          [id number]  DATE:05/16/2007                            DOB:          10/21/1976    REFERRING PHYSICIAN:  Barbette Hair. Artist Pais, DO   REASON FOR REFERRAL:  Thyroid nodule.   HISTORY OF THE PRESENT ILLNESS:  This is a 35 year old woman who has had  several months of pain at the left lateral neck with some associated  slight dysphagia; and, her dysphagia is with solids only.  She was noted  on an MRI to have a questionable nodule.  The question of lymphoma was  also raised, but CTs of the chest and abdomen appear to have excluded  this.   PAST MEDICAL HISTORY:  1. Insomnia.  2. Type 2 diabetes.  3. Bipolar affective disorder.   SOCIAL HISTORY:  The patient is married.  She has three children.  She  does not work outside the home.   FAMILY HISTORY:  The patient's mother had an uncertain type of thyroid  condition.   REVIEW OF SYSTEMS:  The patient does have chronic weight gain.  She  denies shortness of breath.   PHYSICAL EXAMINATION:  VITAL SIGNS:  Blood pressure is 129/87.  Heart  rate is 88.  Temperature is 99.1.  The weight is 241 pounds.  GENERAL APPEARANCE:  In general the patient is obese and in no distress.  SKIN:  The skin is not diaphoretic.  No rash.  HEENT:  Eyes; no proptosis.  No periorbital swelling.  NECK:  The neck is supple.  It is not tender.  There is no swelling.  There is no erythema.  The thyroid is minimally enlarged and I cannot  appreciate any discrete nodules.  CHEST:  The clear to auscultation.  No respiratory distress.  HEART:  Cardiovascular - trace bilateral pretibial edema.  Regular rate  and rhythm.  No murmur.  EXTREMITIES:  Pedal pulses are intact.  LYMPHATICS:  Nodes - there is no lymphadenopathy at the neck nor at the  supraclaviculare area.  NEUROLOGIC EXAMINATION:  Neurologically the  patient is alert and  oriented.  There is no apraxia noted at present. There is no tremor.   LABORATORY STUDIES:  Biopsy of the thyroid nodule of Feb 28, 2007 showed  a non-neoplastic goiter.  TSH on December 18, 2006 was 1.43.  Thyroid  ultrasound on February 15, 2007 showed a right lower pole nodule, 10 x 6 x  9 mm.   IMPRESSION:  1. Thyroid nodule, the tendency of which is usually hereditary; benign      on biopsy.  2. Neck pain not thyroid-related.  3. Chronic weight gain, not thyroid related.  4. Dysphagia of uncertain etiology.   PLAN:  1. We discussed the natural history of nodular disease.  2. I told the patient she should return in a year or sooner if she      feels there is any enlargement of the thyroid.  3. We also discussed the fact that with nodular thyroid disease the  development of hyperthyroidism is a much more common natural      history than is malignancy.  4. I ordered a barium swallow at her request.     Sean A. Everardo All, MD  Electronically Signed    SAE/MedQ  DD: 05/17/2007  DT: 05/19/2007  Job #: [id number]   cc:   Barbette Hair. Artist Pais, DO  Lunette Stands, M.D.

## 2011-03-07 NOTE — Procedures (Signed)
[facility]                              EXERCISE TREADMILL   Shirley [name], Shirley [name]                    MRN:          [id number]  DATE:02/18/2008                            DOB:          07/24/1976    PRIMARY CARE PHYSICIAN:  Barbette Hair. Artist Pais, DO   PROCEDURES:  Exercise treadmill test.   INDICATION:  Evaluate  patient with dyspnea on exertion.   PROCEDURE NOTE:  The patient exercised using standard Bruce protocol.  She was only able to exercise for 6 minutes which completed stage III.  The test was terminated because of dyspnea and fatigue.  She did achieve  a target heart rate.  Peak was 179 which was 95% of predicted.  She  achieved 7.2 METS.  She had appropriate blood pressure response with a  peak of 136/53.  She had no palpitations.  There was no chest pain.  She  had no ischemic ST-T wave changes.  She had a normal heart rate  recovery.   CONCLUSION:  Negative adequate exercise treadmill test with a poor  exercise tolerance.  There are no high-risk features for obstructive  coronary disease.  Of note, I did review her echocardiogram with her  which demonstrates no significant abnormalities.  We did record her  oxygen saturation while walking on the treadmill, and, despite her  dyspnea, she had saturation in the 97-98% range.  I did review the event  monitor she wore, and there were no significant palpitations.  She did  not have a BMP drawn.   PLAN:  No further cardiovascular testing is planned.  The patient will  continue with an exercise regimen.  We discussed this based on the  results of this test.  She will continue with weight loss.  Should she  have progressive dyspnea despite success with this, I would need to  reevaluate probably with a cardiopulmonary stress test.     Rollene Rotunda, MD, [facility]  Electronically Signed    JH/MedQ  DD: 02/18/2008  DT: 02/18/2008  Job #: [id number]   cc:   Barbette Hair. Artist Pais, DO

## 2011-03-07 NOTE — Assessment & Plan Note (Signed)
[facility]                            CARDIOLOGY OFFICE NOTE   Shirley [name], Shirley [name]                    MRN:          [id number]  DATE:12/18/2007                            DOB:          [birth year]    PRIMARY CARE PHYSICIAN:  Barbette Hair. Artist Pais, DO   REASON FOR REFERRAL:  Evaluate patient with dyspnea and palpitations.   HISTORY OF PRESENT ILLNESS:  The patient is a 35 year old African-  American female.  She has had a cardiac evaluation in the past to  include an echocardiogram in 2006 that demonstrated some mildly  increased left ventricular wall thickness but no significant  abnormalities otherwise.   Since December, she has had increasing dyspnea with exertion.  This has  been when she is walking a short distance on level ground.  Sometimes  she can do more without being dyspneic.  She says her weight does  fluctuate may be 5 pounds in a day even on her scale wearing the same  clothing and weighing herself at the same time a day.  She avoids all  salt.  She does not describe PND or orthopnea.  She does not describe  edema or abdominal swelling.  She does not describe substernal chest  pressure, neck or arm discomfort.  She does get occasional sharp  midsternal pain.  This is sporadic.  She does feel her heart racing.  This happens with exertion and at rest.  She will be sitting there and  it will go fast without being irregular.  This may last for 5-10  minutes.  She feels anxious with this.  She tries to calm down and it  may go away.  She has had some lightheadedness with any activity  recently.  She describes some diffuse muscle aches.   PAST MEDICAL HISTORY:  1. Hypertension with pregnancy.  2. Hyperlipidemia.  3. Migraines.  4. Diabetes mellitus since 2008.   PAST SURGICAL HISTORY:  1. Back surgery.  2. C-section x3.  3. D&C.  4. Hysterectomy.   ALLERGIES:  None.   MEDICATIONS:  1. Cyclobenzaprine.  2. Cefuroxime 500 mg b.i.d.  3. Propranolol 80 mg daily.  4. Metformin.  5. Vicodin.  6. NSAID.  7. Lyrica 50 mg b.i.d.  8. Topamax 100 mg b.i.d.  9. Meclizine 25 mg daily.  10.Nabumetone.  11.Fish oil.   SOCIAL HISTORY:  The patient is a homemaker.  She is married.  She has  three children, ages 56, 76 and 4.  She does not smoke cigarettes.  She  does not drink alcohol.   FAMILY HISTORY:  Noncontributory for early coronary artery disease.   REVIEW OF SYSTEMS:  As stated in the HPI and positive for headaches,  dyspnea, palpitations, diffuse muscle pains, fluctuating weights.  Negative for other systems.   PHYSICAL EXAMINATION:  GENERAL:  The patient is in no distress.  VITAL SIGNS:  Blood pressure 129/89, heart rate 92 and regular, weight  243 pounds, body mass index 38.  HEENT:  Eyes are unremarkable.  Pupils equal, round, reactive to light.  Fundi not visualized, oral mucosa unremarkable.  NECK:  No jugular venous distention at 45 degrees, carotid upstroke  brisk and symmetrical.  No bruits, thyromegaly.  LYMPHATICS:  No cervical, axillary, inguinal adenopathy.  LUNGS:  Clear to auscultation bilaterally.  BACK:  No costovertebral.  CHEST:  Unremarkable.  HEART:  PMI not displaced or sustained.  S1-S2 within normal limits.  No  S3, no S4, no clicks, rubs or murmurs.  ABDOMEN:  Morbidly obese, positive bowel sounds.  Normal in frequency  and pitch, no bruits, rebound, guarding.  No midline pulsatile mass.  No  hepatomegaly, no splenomegaly.  SKIN:  No rashes, no nodules.  EXTREMITIES:  2+ pulses throughout, no edema, no cyanosis, no clubbing.  NEUROLOGIC:  Oriented to person, place and time.  Cranial nerves II-XII  grossly intact, motor grossly intact.   ELECTROCARDIOGRAM:  Sinus rhythm, rate 90, axis within normal limits,  intervals within normal limits, no acute ST-T wave changes.   ASSESSMENT/PLAN:  1. Dyspnea.  The patient's predominant complaint seems to be dyspnea.      She did on an echo done  today appear to have some diastolic      dysfunction but normal systolic function.  I will be getting the      final results of this.  I am going to check a BNP level.  This      could be related to deconditioning.  I am going to put her on a      treadmill and see if I can reproduce these symptoms and measure an      oxygen saturation.  She might need cardiopulmonary stress testing.      I do not strongly suspect obstructive coronary disease.  Again, she      says she watches salt and will continue to counsel her about this      and volume management.  2. Obesity.  I think this is a significant issue and may be      contributing greatly to the patient's symptoms.  However, will have      to figure this out by excluding other etiologies.  Regardless, she      needs weight loss, and I will counsel her about this, give her some      specific instructions for exercise and diet.  3. Palpitations.  She will have a 2-week event monitor.  Will look      back at Dr. Olegario Messier records to make sure she had TSH and      electrolytes.  4. Follow up.  I will see her at the time of her treadmill and then      again after her event monitor.     Rollene Rotunda, MD, [facility]  Electronically Signed    JH/MedQ  DD: 12/18/2007  DT: 12/19/2007  Job #: [id number]   cc:   Barbette Hair. Artist Pais, DO
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
## 2011-03-10 NOTE — Discharge Summary (Signed)
[facility]  Patient:    Shirley [name], Shirley [name] Visit Number: [id number] MRN: [id number]          Service Type: OBS Location: 910A 9137 01 Attending Physician:  Malon Kindle Dictated by:   Malachi Pro. Ambrose Mantle, M.D. Admit Date:  11/25/2001 Discharge Date: 12/02/2001                             Discharge Summary  HISTORY OF PRESENT ILLNESS:   A 35 year old, black, married female para 1-0-0-1, gravida 2, [facility] January 14, 2002 by dates with January 06, 2002 by ultrasound admitted with worsening preeclampsia.  Blood group and type O positive, negative antibody, sickle cell negative, RPR nonreactive, rubella immune, hepatitis B surface antigen negative, HIV negative, GC and Chlamydia negative, triple screen normal, 1-hour glucola 120.  The vaginal ultrasound on June 14, 2001 showed a crown-rump length of 3.7 cm, 10 weeks 4 days, [facility] January 06, 2002.  There was a posterior myoma that was 4.4 x 4 cm.  The patient developed preeclampsia and on the day of admission had 3+ proteinuria.  PAST MEDICAL HISTORY:         Her past medical history is outlined in her history and physical exam.  HOSPITAL COURSE:              The patient was admitted and placed at bed rest. A 24-hour urine collection revealed 6048 mg of protein in 24 hours.  On the fourth hospital day, the patient complained of nausea, emesis x 2, recurrent headache and in spite of the fact that her labs were not terribly abnormal we felt with the severe preeclampsia based on the high level of protein and the elevated blood pressure and other symptoms we should proceed with cesarean section for the complete breech presentation.  The patient underwent a low transverse cervical C-section by Dr. Ambrose Mantle with Dr. Senaida Ores assisting under general anesthesia because the patient refused a spinal with delivery of a female infant 4 pounds 2 ounces, Apgars of 8 at one and 8 at five minutes.  DIAGNOSES:                     1. Severe preeclampsia.                               2. Breech presentation.                               3. Growth restricted fetus.                               4. Fibroids.  POSTOPERATIVE COURSE:         The patient did well.  Her blood pressures did not return to normal but returned to an acceptable range.  PIH labs remained relatively normal and on the fourth postop day her staples were removed, strips were applied and she was discharged.  Initial hemoglobin was 11.3, hematocrit 35.1, platelet count 270,000, white count 7700.  Followup hematocrit is 32.7, 30.8, and 26.8.  PIH labs were normal except just before delivery she had an LDH of 266.  Uric acids were always abnormal at 8, 7.3, 7.2, and 7.3.  A 24-hour urine as noted was 6048 mg, creatinine  cleareance was 191 mL/minute, RPR nonreactive.  FINAL DIAGNOSES:              1. Intrauterine pregnancy at 34 plus weeks.                               2. Severe preeclampsia.                               3. Complete breech presentation.                               4. Growth restricted fetus.                               5. Leiomyomata.  OPERATION:                    Low transverse cervical C-section.  FINAL CONDITION:              Improved.  DISCHARGE INSTRUCTIONS:       Instructions include our regular discharge instruction booklet, Percocet 5/325 (20 tablets) one or two every three to four hours as needed for pain and the patient is asked to return to the office in three to four days for followup examination. Dictated by:   Malachi Pro. Ambrose Mantle, M.D. Attending Physician:  Malon Kindle DD:  12/02/01 TD:  12/02/01 Job: [id number]

## 2011-03-10 NOTE — H&P (Signed)
[facility]  Patient:    Shirley [name], Shirley [name] Visit Number: [id number] MRN: [id number]          Service Type: OBS Location: 9400 [id number] 01 Attending Physician:  Malon Kindle Dictated by:   Malachi Pro. Ambrose Mantle, M.D. Admit Date:  11/25/2001                           History and Physical  HISTORY OF PRESENT ILLNESS:   A 35 year old black married female para 1-0-0-1 gravida 2; last period April 09, 2001; [facility] January 14, 2002 by dates but January 06, 2002 by ultrasound; admitted with possible worsening preeclampsia.  Blood group and type were O positive with a negative antibody.  Sickle cell negative.  RPR nonreactive.  Rubella immune.  Hepatitis B surface antigen negative.  HIV negative.  GC and chlamydia negative.  Triple screen normal. One-hour Glucola 120.  Vaginal ultrasound on June 14, 2001 showed crown-rump length 3.7 cm, 10 weeks four days, [facility] January 06, 2002.  A 4 x 4 cm posterior myoma was noted.  Repeat ultrasound on August 22, 2001 showed an average gestational age of [redacted] weeks 0 days, [facility] January 09, 2002.  On November 06, 2001 the patients blood pressure was 150/100; labs were normal.  On November 13, 2001, 24-hour urine protein was 300+ mg in 24 hours.  The blood pressure remained elevated and on the day of admission the patient reported headache, earache, and pain in her right lower wisdom tooth.  She was noted to have 3+ proteinuria in the office.  She came to maternity admission; urine protein was 30 mg%.  All other labs were normal except uric acid was 8.0.  She was admitted to follow the blood pressure and monitor her urinary protein.  PAST MEDICAL HISTORY:  ALLERGIES:                    IV BENADRYL - "out of control."  ILLNESSES:                    Preeclampsia with the first pregnancy.  OPERATIONS:                   In 1999, a D&C for retained placenta.  In 2001, repair of a herniated disk.  FAMILY HISTORY:               Father  with high blood pressure, heart disease, and diabetes.  Mother with diabetes.  Daughter has sickle cell trait.  HABITS:                       Alcohol, tobacco, and drugs:  None.  OBSTETRICAL HISTORY:          In May 1999 the patient delivered a 5 pound 12 ounce female infant vaginally.  She had preeclampsia and she did have a retained placenta that required a D&C while out of town, and required 1 unit of blood transfusion at the time of the [facility].  PHYSICAL EXAMINATION ON ADMISSION:  VITAL SIGNS:                  Blood pressure 150/100, temperature 99, pulse 80, respirations 16.  HEENT:                        Revealed swollen facies.  There seemed to be possibly some inflammation around  the right lower wisdom tooth.  HEART:                        Normal size and sounds, no murmurs.  LUNGS:                        Clear to P&A.  BREASTS:                      Not examined.  ABDOMEN:                      Soft.  Fundal height 34 cm.  Fetal heart tones normal.  Nonstress test was reactive.  PELVIC:                       The cervix was a fingertip, long, presenting part was high.  EXTREMITIES:                  Deep tendon reflexes were trace to 1+.  LABORATORY DATA:              PIH labs were normal except for the 31 mg% protein and the uric acid of 8.0.  ADMITTING IMPRESSIONS:        1. Intrauterine pregnancy at 34 weeks.                               2. Preeclampsia.  PLAN:                         The patient was placed on bedrest and was begun on a 24-hour urine and an ultrasound was ordered for the next day.  On November 26, 2001 her blood pressures were much better at 137-150/82-100, she felt fine, her 24-hour urine was in progress.  On November 27, 2001 the patient complained of a headache off and on.  She was uncomfortable in her right ear and face.  She was afebrile.  Her blood pressures were 140-160/90-100.  The abdomen was gravid and nontender.  The 24-hour urine was in  progress.  AFI on the ultrasound was 12.2.  Estimated fetal weight was approximately 1800 g which was less than the 10th percentile for 36 weeks.  There was an anterior myoma.  Umbilical artery Doppler was abnormal but there was no absent or reversed flow.  Breech presentation.  The 24-hour urine returned with 6+ grams of protein.  Her uric acid was down to 7.3, blood pressures remained essentially unchanged.  Dr. Senaida Ores placed the patient on magnesium sulfate for seizure prophylaxis.  On November 28, 2001 the patient complained of nausea, she vomited twice, she had recurrent headache, and because of the very high protein and growth restriction, in spite of the fact that her liver function tests were normal, we felt that it was best to proceed with cesarean section.  The patient is prepared for C section. Dictated by:   Malachi Pro. Ambrose Mantle, M.D. Attending Physician:  Malon Kindle DD:  11/28/01 TD:  11/28/01 Job: [id number]

## 2011-03-10 NOTE — H&P (Signed)
NAME:  Shirley [name], Shirley [name]                       ACCOUNT NO.:  [id number]   MEDICAL RECORD NO.:  [id number]                   PATIENT TYPE:  AMB   LOCATION:  DAY                                  FACILITY:  [facility]   PHYSICIAN:  Zenaida Niece, M.D.             DATE OF BIRTH:  [birth year]   DATE OF ADMISSION:  03/17/2004  DATE OF DISCHARGE:                                HISTORY & PHYSICAL   CHIEF COMPLAINT:  Persistent abnormal uterine bleeding.   HISTORY OF PRESENT ILLNESS:  This is a 35 year old black female, para 2-1-0-  3, who was seen for an annual exam in September of 2004.  At that time, she  was having regular menses that were fairly heavy and wanted an IUD for both  contraception and to decrease her periods.  On September 04, 2003, she had a  Mirena IUD placed without complications.  However, she continued to have  persistent irregular bleeding and intermittent cramping.  This progressed to  the point where on December 25, 2003, she requested removal of the IUD and this  was performed without complications.  She was then started on Yasmin for  contraception.  She continued to have irregular bleeding.  On Feb 23, 2004,  she had a pelvic ultrasound performed which reveals a slightly enlarged  uterus with 2 intramural leiomyomas measuring up to 3.7 x 2.8 x 2.1 cm.  Ovaries were normal.  With saline infusion, it was difficult to get fluid to  stay in the cavity but the visualization I could get revealed a normal  endometrial cavity.  Due to the fibroids and her persistent bleeding, we  discussed all medical and surgical options and the patient wishes to proceed  with definitive surgical therapy with hysterectomy.   PAST OBSTETRICAL HISTORY:  1. In 1999, vaginal delivery at 39 weeks complicated by preeclampsia.  2. In 2003, low transverse cesarean section at 34 weeks complicated by     preeclampsia.   GYNECOLOGICAL HISTORY:  History of low-grade SIL Pap smear with normal  colposcopy  in 2002 and her most recent Pap smear in September was ASCUS with  negative high-risk HPV.   PAST MEDICAL HISTORY:  Intermittent elevated blood pressure and intermittent  proteinuria.   PAST SURGICAL HISTORY:  1. Cesarean section x1.  2. Back surgery for a bad disk.  3. D&C.   ALLERGIES:  None.   CURRENT MEDICATIONS:  None.   SOCIAL HISTORY:  The patient is married and denies alcohol, tobacco or drug  use.   FAMILY HISTORY:  No GYN or colon cancer.   PHYSICAL EXAM:  GENERAL:  This is a well-developed black female in no acute  distress.  NECK:  Neck is supple without lymphadenopathy.  LUNGS:  Lungs are clear to auscultation.  HEART:  Regular rate and rhythm without murmur.  ABDOMEN:  Abdomen is soft, nontender and nondistended, without palpable  masses.  EXTREMITIES:  Extremities have  no edema, are nontender.  PELVIC:  External genitalia are normal.  Speculum exam reveals a normal  cervix.  On bimanual exam, her uterus is slightly enlarged and mid-planer  and nontender and she has no adnexal masses.   ASSESSMENT:  Persistent abnormal uterine bleeding, despite an intrauterine  device and medical management with an oral contraceptive pill.  All options  have been discussed with the patient and she wishes to proceed with  definitive surgical therapy.  She understands that this will result in  permanent sterility and is comfortable with this.  The risks of surgery  including bleeding, infection and damage to her surrounding organs have been  discussed.   PLAN:  Plan is to admit the patient for a vaginal hysterectomy and  cystoscopy.  There is the chance that we will have to do this by abdominal  hysterectomy but hopefully can do it vaginally.                                               Zenaida Niece, M.D.    TDM/MEDQ  D:  03/16/2004  T:  03/16/2004  Job:  [id number]

## 2011-03-10 NOTE — Op Note (Signed)
NAME:  Shirley [name], Shirley [name]                       ACCOUNT NO.:  [id number]   MEDICAL RECORD NO.:  [id number]                   PATIENT TYPE:  OBV   LOCATION:  2440                                 FACILITY:  [facility]   PHYSICIAN:  Zenaida Niece, M.D.             DATE OF BIRTH:  10/27/1975   DATE OF PROCEDURE:  DATE OF DISCHARGE:                                 OPERATIVE REPORT   PREOPERATIVE DIAGNOSIS:  Abnormal uterine bleeding and uterine myomas.   POSTOPERATIVE DIAGNOSIS:  Abnormal uterine bleeding and uterine myomas.   PROCEDURE:  Transvaginal hysterectomy and cystoscopy.   SURGEON:  Zenaida Niece, M.D.   ASSISTANT:  Huel Cote, M.D.   ANESTHESIA:  General endotracheal tube.   ESTIMATED BLOOD LOSS:  300 mL.   FINDINGS:  Slightly enlarged, irregular uterus with normal tubes and  ovaries.  Normal bladder with cystoscopy with patent ureters.   PROCEDURE IN DETAIL:  The patient was taken to the operating room and placed  in the dorsal supine position.  General anesthesia was induced, and she was  placed in mobile stirrups.  The perineum and vagina were prepped and draped  in the usual sterile fashion and the bladder drained with a red rubber  catheter.  A weighted speculum was inserted into the vagina and Deaver  retractors used anteriorly and laterally.  The cervix was grasped with  Christella Hartigan tenaculums, and the cervicovaginal mucosa was infiltrated with a  dilute solution of Pitressin.  The cervicovaginal mucosa was then incised  circumferentially with electrocautery.  Sharp dissection was then used to  push the bladder off anteriorly.  The anterior peritoneum was easily  identified and entered sharply and a bladder used to retract the bladder  anteriorly.  Bowels were able to be seen through this incision.  The  posterior cul-de-sac was then grasped and entered sharply and a banana  speculum placed into the posterior cul-de-sac.  The uterosacral ligaments  were then  clamped, transected, ligated, and tagged for later use on each  side.  The uterine arteries, cardinal ligaments, and lower broad ligaments  were clamped, transected, and ligated on each side with #1 chromic.  The  uterus was then delivered through the incision, and utero-ovarian pedicles  were clamped and transected on each side, and the uterus was removed.  These  pedicles were then doubly ligated with #1 chromic and tagged for inspection.  Bleeding from each side was initially controlled with electrocautery.  The  uterosacral ligaments were then plicated in the midline with 2-0 silk.  There then occurred to be some persistent bleeding, which was difficult to  identify.  Several sutures were placed in the area where the uterosacral  ligaments were plicated in the midline because this appeared to be where the  bleeding was coming from.  However, this did not stop the bleeding.  The  silk suture was then cut, and these previously placed sutures were cut  for  adequate visualization.  Bleeding was then seen to be coming from the  patient's right side just below the utero-ovarian pedicle.  This was  controlled with a figure-of-eight suture of #1 chromic.  All pedicles then  appeared to be hemostatic.  The previously tagged uterosacral pedicles were  then tied in the midline.  The vagina was then closed with running locking 2-  0 Vicryl with adequate hemostasis.   The patient was given indigo carmine IV, and a 70-degree cystoscope was  inserted.  The bladder was normal with no obvious injury.  Both ureteral  orifices were identified, and indigo carmine was seen to come from each  orifice without difficulty.  The cystoscope was removed, and a Foley  catheter was placed.  The patient was taken down from stirrups.  She was  extubated in the operating room, tolerated the procedure well, and was taken  to the recovery room in stable condition.  Counts were correct x2.  She  received Ancef 1 g  preoperatively, and she had __________ all throughout the  procedure.                                               Zenaida Niece, M.D.    TDM/MEDQ  D:  03/17/2004  T:  03/17/2004  Job:  [id number]

## 2011-03-10 NOTE — Discharge Summary (Signed)
NAME:  Shirley [name], Shirley [name]                       ACCOUNT NO.:  [id number]   MEDICAL RECORD NO.:  [id number]                   PATIENT TYPE:  INP   LOCATION:  9305                                 FACILITY:  [facility]   PHYSICIAN:  Zenaida Niece, M.D.             DATE OF BIRTH:  03/10/1976   DATE OF ADMISSION:  03/04/2003  DATE OF DISCHARGE:  03/07/2003                                 DISCHARGE SUMMARY   ADMISSION DIAGNOSES:  1. Intrauterine pregnancy at 37 weeks.  2. Pregnancy-induced hypertension.  3. Previous cesarean section.   DISCHARGE DIAGNOSES:  1. Intrauterine pregnancy at 37 weeks.  2. Pregnancy-induced hypertension.  3. Previous cesarean section.   PROCEDURE:  On May 13th she had a VBAC.   HISTORY AND PHYSICAL:  This is a 35 year old black female gravida 3, para 1-  1-0-2 with an EGA of 37+ weeks with a due date of May 26th by a 5-week  ultrasound who presents to Maternity Admission with complaint of increasing  edema, pelvic pressure, and elevated blood pressure at home.  Prenatal care  has been complicated by PIH with the blood pressures in 120s to 130s over  90s with reactive nonstress test 2 times a week and periodic normal labs  with a stable elevated uric acid.  On evaluation on the day of admission,  she had new proteinuria on a dipstick and uric acid was further elevated to  7.9 with other labs being normal.  She had no significant PIH symptoms.  She  does have a history of preeclampsia with her last pregnancy with a C-section  performed at 34 weeks for breech.  The patient desires a VBAC.   PRENATAL LABORATORY DATA:  Blood type is O positive with a negative antibody  screen, sickle screen is negative, RPR nonreactive, rubella immune,  hepatitis B surface antigen negative, gonorrhea and Chlamydia negative,  group B strep is negative.   PAST OBSTRETICAL HISTORY:  In 1999 vaginal delivery at 39 weeks  preeclampsia.  In 2003, a low transverse cesarean section at  34 weeks for  preeclampsia.   GYNECOLOGICAL HISTORY:  Low-grade SIL Pap smear with normal colposcopy in  2002.   PAST SURGICAL HISTORY:  Cesarean section, D&C, and repair of a herniated  disk.   PHYSICAL EXAMINATION:  VITAL SIGNS:  She is afebrile with blood pressure in  130s to 150s over 80s to 100.  ABDOMEN:  Gravid and nontender.  Estimated fetal weight 6-1/2 to 7 pounds.  Cervix is 2-3 cm, 50% effaced, minus 3 station and posterior.   HOSPITAL COURSE:  The patient was admitted by Dr. Senaida Ores and started on  Pitocin.  Blood pressures remained stable 130s over 90s with good urine  output.  She progressed in active labor and received an epidural.  She then  progressed to complete, pushed well, and on the morning of May 13th had a  vaginal delivery of a vigorous female  infant with Apgars of 8 and 9 and  weighed 6 pounds 10 ounces over an intact perineum.  Nuchal cord x1 was  reduced.  She has a small periurethral laceration which was repaired with 3-  0 Vicryl.  Placenta spontaneous and intact.  Estimated blood loss 300 mL.  Blood pressures remained stable after delivery and she remained afebrile.  Predelivery hemoglobin 10.9, postdelivery 9.  She bottle fed her baby  without complications and on the morning of postpartum day #2 she was felt  to be stable enough for discharge home.   DISCHARGE INSTRUCTIONS:  Regular diet, pelvic rest, to follow up in six  weeks.   MEDICATIONS:  Demerol 50 mg, dispense #20, one to two p.o. q.4-6 h. p.r.n.  pain as well as over-the-counter Motrin as needed.  She is also given our  discharge pamphlet.                                               Zenaida Niece, M.D.    TDM/MEDQ  D:  03/07/2003  T:  03/07/2003  Job:  [id number]

## 2011-03-10 NOTE — Op Note (Signed)
[facility]  Patient:    Shirley [name], Shirley [name] Visit Number: [id number] MRN: [id number]          Service Type: OBS Location: 9400 [id number] Attending Physician:  Malon Kindle Dictated by:   Malachi Pro. Ambrose Mantle, M.D. Proc. Date: 11/28/01 Admit Date:  11/25/2001                             Operative Report  PREOPERATIVE DIAGNOSES:       1. Intrauterine pregnancy at 34+ weeks.                               2. Severe preeclampsia.                               3. Breech presentation.                               4. Growth restricted fetus.                               5. Fibroids.  POSTOPERATIVE DIAGNOSES:      1. Intrauterine pregnancy at 34+ weeks.                               2. Severe preeclampsia.                               3. Breech presentation.                               4. Growth restricted fetus.                               5. Fibroids.  OPERATION:                    Low transverse cervical C section.  SURGEON:                      Malachi Pro. Ambrose Mantle, M.D.  ASSISTANT:                    Alvino Chapel, M.D.  ANESTHESIA:                   General; the patient refused a spinal anesthetic.  DESCRIPTION OF PROCEDURE:     The patient was taken to the operating room and was placed on the operating room table where the fetal heart rate was confirmed as normal.  The abdomen was prepped with Betadine solution.  The urethra was prepped and a Foley catheter was inserted to straight drain.  The abdomen was then draped as a sterile field and Dr. Harvest Forest and the nurse anesthetist induced general anesthesia.  After general anesthesia was induced, a transverse incision was made and carried in layers through the skin, subcutaneous tissue, and fascia.  The fascia was divided transversely and then separated from the rectus muscle superiorly and inferiorly.  The rectus muscle was  already split in the midline.  The peritoneum was opened  vertically.  The bladder was retracted away and incision was made into the lower uterine segment peritoneum and the bladder was again retracted away.  An incision was then made into the lower uterine segment after I felt that the uterine segment had been developed enough to make a transverse incision.  I extended the incision transversely with the bandage scissors, going outward and upward.  I then found both feet presenting at the incisional opening, delivery both feet, rotated the breech from the posterior to the anterior position, then brought each arm down separately and brought the head out with my finger in the babys mouth.  The nose and pharynx were suctioned with the bulb, the cord was clamped, and the infant was given to the neonatologist who was in attendance. She assigned Apgars to the 4 pound 2 ounce female infant of 8 and 8 at one and five minutes.  A segment of cord blood was obtained and capped in case a pH was necessary.  The remainder of the cord was then used to draw routine cord blood studies.  The placenta was removed.  There were some fragments left in the uterus.  I tried my best with ring forceps and Kellys to remove all the fragments of debris from the inside of the uterus.  The uterus was otherwise normal except for a probably 6 cm leiomyoma that was intramural in the area of the left cornu.  The cervix was dilated with a ring forceps.  Both tubes and ovaries appeared normal.  I closed the uterine incision with two running sutures of 0 Vicryl - locking the first layer, nonlocking on the second layer. I obtained hemostasis, liberally irrigated the pelvis, found that there was no significant bleeding, and closed the abdominal wall using interrupted sutures of 0 Vicryl in the rectus muscle, two running sutures of 0 Vicryl in the rectus fascia, a running 3-0 Vicryl in the subcutaneous tissue, and staples on the skin.  The patient seemed to tolerate the procedure well.   Blood loss was thought to be about 1000 cc, sponge and needle counts were correct, and she was returned to recovery in satisfactory condition. Dictated by:   Malachi Pro. Ambrose Mantle, M.D. Attending Physician:  Malon Kindle DD:  11/28/01 TD:  11/28/01 Job: [id number]

## 2011-03-10 NOTE — Procedures (Signed)
NAME:  Shirley [name], BERCH             ACCOUNT NO.:  [id number]   MEDICAL RECORD NO.:  [id number]          PATIENT TYPE:  OUT   LOCATION:  SLEEP CENTER                 FACILITY:  [facility]   PHYSICIAN:  Coralyn Helling, M.D.      DATE OF BIRTH:  [birth year]   DATE OF STUDY:  01/18/2006                              NOCTURNAL POLYSOMNOGRAM   REFERRING PHYSICIAN:  Coralyn Helling, M.D.   INDICATIONS FOR PROCEDURE:  This is an individual who had complaints of  excessive day time sleepiness, restless sleep and snoring, was referred to  the sleep lab for evaluation of sleep disordered breathing.  Her Epworth  sleep score is 9.   MEDICATIONS:  Abilify, Lunesta, Trazodone, Neurontin and Topamax.   Sleep architecture:  Total recorded time was 437 minutes.  Total sleep time  was 417 minutes, sleep efficiency was 95%.  The patient was observed in all  stages of sleep.  However, there was a relative increase in the percentage  of slow wave sleep at 35% and a reduction in the percent of REM sleep at 5%.  Sleep latency was 11 minutes.  REM latency was 81 minutes.  The patient was  observed in both the supine and nonsupine position.   Respiratory data:  The apnea hypopnea index was 0.3.  The respiratory events  were exclusive obstructive in nature.  Mild to moderate snoring was noted by  the technician.   Oxygen data:  The oxygen saturation nadir was 90%.  The patient spent the  entire test time with an oxygen saturation greater than 90%.   Cardiac data:  EKG showed normal sinus rhythm.   Movement of parasomnia:  The periodic limb movement index was 7.5.   IMPRESSION:  This study does not show evidence for sleep disordered  breathing.  The patient should be counseled with regard to techniques to  improve her sleep hygiene.  Additionally, some of her complaints may be  related to side effects of the several medications that she is on.  If after  intervening with these matters, the patient is still  symptomatic, then  consideration could be given to having the patient undergo a multiple sleep  latency test to further evaluate the degree of her complaints of day time  somnolence.     Coralyn Helling, M.D.  Diplomat, Biomedical engineer of Sleep Medicine  Electronically Signed    VS/MEDQ  D:  02/05/2006 18:58:36  T:  02/06/2006 12:42:36  Job:  [id number]

## 2011-03-21 ENCOUNTER — Telehealth: Payer: Self-pay

## 2011-03-21 DIAGNOSIS — G894 Chronic pain syndrome: Secondary | ICD-10-CM | POA: Insufficient documentation

## 2011-03-21 HISTORY — DX: Chronic pain syndrome: G89.4

## 2011-03-21 NOTE — Telephone Encounter (Signed)
Done per emr 

## 2011-03-21 NOTE — Telephone Encounter (Signed)
Pt called requesting referral to [facility] Dr Randal Buba. Pt has been a pt of this Dr

## 2011-03-22 NOTE — Telephone Encounter (Signed)
Pt advised, will expect a call from [facility] with appt info 

## 2011-04-24 ENCOUNTER — Telehealth: Payer: Self-pay

## 2011-04-24 NOTE — Telephone Encounter (Signed)
Ok - to sharon to update allergy to tdap

## 2011-04-24 NOTE — Telephone Encounter (Signed)
Pt called stating she received a TDAP vaccination at another provider's office and woke up the next morning with a rash, hives, itching, swelling and nervousness. Problem is now completely resolved but pt wanted MD to be aware and update chart accordingly.

## 2011-04-24 NOTE — Telephone Encounter (Signed)
Allergy updated.

## 2011-05-22 ENCOUNTER — Other Ambulatory Visit: Payer: Self-pay | Admitting: Internal Medicine

## 2011-05-22 ENCOUNTER — Other Ambulatory Visit: Payer: Self-pay

## 2011-05-22 DIAGNOSIS — IMO0001 Reserved for inherently not codable concepts without codable children: Secondary | ICD-10-CM

## 2011-05-22 DIAGNOSIS — Z0000 Encounter for general adult medical examination without abnormal findings: Secondary | ICD-10-CM

## 2011-05-23 DIAGNOSIS — M79609 Pain in unspecified limb: Secondary | ICD-10-CM

## 2011-05-23 DIAGNOSIS — R1310 Dysphagia, unspecified: Secondary | ICD-10-CM

## 2011-05-26 ENCOUNTER — Other Ambulatory Visit (INDEPENDENT_AMBULATORY_CARE_PROVIDER_SITE_OTHER): Payer: 59

## 2011-05-26 ENCOUNTER — Other Ambulatory Visit (INDEPENDENT_AMBULATORY_CARE_PROVIDER_SITE_OTHER): Payer: 59 | Admitting: Internal Medicine

## 2011-05-26 DIAGNOSIS — Z0000 Encounter for general adult medical examination without abnormal findings: Secondary | ICD-10-CM

## 2011-05-26 DIAGNOSIS — IMO0001 Reserved for inherently not codable concepts without codable children: Secondary | ICD-10-CM

## 2011-05-26 LAB — LDL CHOLESTEROL, DIRECT: Direct LDL: 165.1 mg/dL

## 2011-05-26 LAB — HEMOGLOBIN A1C: Hgb A1c MFr Bld: 12.2 % — ABNORMAL HIGH (ref 4.6–6.5)

## 2011-05-26 LAB — CBC WITH DIFFERENTIAL/PLATELET
Basophils Absolute: 0 10*3/uL (ref 0.0–0.1)
Basophils Relative: 0.4 % (ref 0.0–3.0)
Eosinophils Absolute: 0.1 10*3/uL (ref 0.0–0.7)
Eosinophils Relative: 1.6 % (ref 0.0–5.0)
HCT: 41.5 % (ref 36.0–46.0)
Hemoglobin: 13.5 g/dL (ref 12.0–15.0)
Lymphocytes Relative: 28.4 % (ref 12.0–46.0)
Lymphs Abs: 2 10*3/uL (ref 0.7–4.0)
MCHC: 32.6 g/dL (ref 30.0–36.0)
MCV: 85.3 fl (ref 78.0–100.0)
Monocytes Absolute: 0.5 10*3/uL (ref 0.1–1.0)
Monocytes Relative: 7.4 % (ref 3.0–12.0)
Neutro Abs: 4.5 10*3/uL (ref 1.4–7.7)
Neutrophils Relative %: 62.2 % (ref 43.0–77.0)
Platelets: 320 10*3/uL (ref 150.0–400.0)
RBC: 4.87 Mil/uL (ref 3.87–5.11)
RDW: 13.9 % (ref 11.5–14.6)
WBC: 7.2 10*3/uL (ref 4.5–10.5)

## 2011-05-26 LAB — BASIC METABOLIC PANEL
BUN: 9 mg/dL (ref 6–23)
CO2: 26 mEq/L (ref 19–32)
Calcium: 9.4 mg/dL (ref 8.4–10.5)
Chloride: 102 mEq/L (ref 96–112)
Creatinine, Ser: 0.7 mg/dL (ref 0.4–1.2)
GFR: 129.58 mL/min (ref >60.00)
Glucose, Bld: 228 mg/dL — ABNORMAL HIGH (ref 70–99)
Potassium: 4.4 mEq/L (ref 3.5–5.1)
Sodium: 136 mEq/L (ref 135–145)

## 2011-05-26 LAB — URINALYSIS, ROUTINE W REFLEX MICROSCOPIC
Bilirubin Urine: NEGATIVE
Hgb urine dipstick: NEGATIVE
Ketones, ur: NEGATIVE
Leukocytes, UA: NEGATIVE
Nitrite: NEGATIVE
Specific Gravity, Urine: >=1.03 (ref 1.000–1.030)
Total Protein, Urine: 30
Urine Glucose: 100
Urobilinogen, UA: 0.2 (ref 0.0–1.0)
pH: 5.5 (ref 5.0–8.0)

## 2011-05-26 LAB — LIPID PANEL
Cholesterol: 244 mg/dL — ABNORMAL HIGH (ref 0–200)
HDL: 52.4 mg/dL (ref >39.00)
Total CHOL/HDL Ratio: 5
Triglycerides: 114 mg/dL (ref 0.0–149.0)
VLDL: 22.8 mg/dL (ref 0.0–40.0)

## 2011-05-26 LAB — HEPATIC FUNCTION PANEL
ALT: 20 U/L (ref 0–35)
AST: 23 U/L (ref 0–37)
Albumin: 4 g/dL (ref 3.5–5.2)
Alkaline Phosphatase: 85 U/L (ref 39–117)
Bilirubin, Direct: 0.1 mg/dL (ref 0.0–0.3)
Total Bilirubin: 0.8 mg/dL (ref 0.3–1.2)
Total Protein: 7.9 g/dL (ref 6.0–8.3)

## 2011-05-26 LAB — TSH: TSH: 1.06 u[IU]/mL (ref 0.35–5.50)

## 2011-05-26 LAB — MICROALBUMIN / CREATININE URINE RATIO
Creatinine,U: 355.8 mg/dL
Microalb Creat Ratio: 4.4 mg/g (ref 0.0–30.0)
Microalb, Ur: 15.8 mg/dL — ABNORMAL HIGH (ref 0.0–1.9)

## 2011-05-28 ENCOUNTER — Encounter: Payer: Self-pay | Admitting: Internal Medicine

## 2011-05-28 DIAGNOSIS — Z0000 Encounter for general adult medical examination without abnormal findings: Secondary | ICD-10-CM | POA: Insufficient documentation

## 2011-05-29 ENCOUNTER — Ambulatory Visit (INDEPENDENT_AMBULATORY_CARE_PROVIDER_SITE_OTHER): Payer: 59 | Admitting: Internal Medicine

## 2011-05-29 ENCOUNTER — Encounter: Payer: Self-pay | Admitting: Internal Medicine

## 2011-05-29 ENCOUNTER — Telehealth: Payer: Self-pay

## 2011-05-29 VITALS — BP 112/82 | HR 93 | Temp 98.0°F | Ht 65.0 in | Wt 227.2 lb

## 2011-05-29 DIAGNOSIS — Z79899 Other long term (current) drug therapy: Secondary | ICD-10-CM | POA: Insufficient documentation

## 2011-05-29 DIAGNOSIS — E119 Type 2 diabetes mellitus without complications: Secondary | ICD-10-CM

## 2011-05-29 DIAGNOSIS — Z0000 Encounter for general adult medical examination without abnormal findings: Secondary | ICD-10-CM

## 2011-05-29 HISTORY — DX: Other long term (current) drug therapy: Z79.899

## 2011-05-29 MED ORDER — SIMVASTATIN 80 MG PO TABS: 80.0000 mg | ORAL_TABLET | Freq: Every day | ORAL | Status: DC

## 2011-05-29 MED ORDER — CYCLOBENZAPRINE HCL 5 MG PO TABS: 5.0000 mg | ORAL_TABLET | Freq: Three times a day (TID) | ORAL | Status: DC | PRN

## 2011-05-29 MED ORDER — HYDROCHLOROTHIAZIDE 25 MG PO TABS: 25.0000 mg | ORAL_TABLET | Freq: Every day | ORAL | Status: DC

## 2011-05-29 MED ORDER — PIOGLITAZONE HCL-METFORMIN HCL 15-500 MG PO TABS: ORAL_TABLET | ORAL | Status: DC

## 2011-05-29 MED ORDER — GLUCOSE BLOOD VI STRP: ORAL_STRIP | Status: DC

## 2011-05-29 MED ORDER — FLUTICASONE FUROATE 27.5 MCG/SPRAY NA SUSP: 1.0000 | Freq: Every day | NASAL | Status: DC

## 2011-05-29 MED ORDER — GLIPIZIDE ER 10 MG PO TB24: 10.0000 mg | ORAL_TABLET | Freq: Two times a day (BID) | ORAL | Status: DC

## 2011-05-29 MED ORDER — LANCETS MISC: Status: DC

## 2011-05-29 NOTE — Assessment & Plan Note (Signed)
Uncontrolled, to restart meds, f/u with labs 6 wks and with next visit

## 2011-05-29 NOTE — Progress Notes (Signed)
Subjective:    Patient ID: Shirley [name], female    DOB: [birth year], 35 y.o.   MRN: [id number]  HPI  Here for wellness and f/u;  Overall doing ok;  Pt denies CP, worsening SOB, DOE, wheezing, orthopnea, PND, worsening LE edema, palpitations, dizziness or syncope.  Pt denies neurological change such as new Headache, facial or extremity weakness.  Pt denies polydipsia, polyuria, or low sugar symptoms. Pt states overall good compliance with treatment and medications, good tolerability, and trying to follow lower cholesterol diet.  Pt denies worsening depressive symptoms, suicidal ideation or panic. No fever, wt loss, night sweats, loss of appetite, or other constitutional symptoms.  Pt states good ability with ADL's, low fall risk, home safety reviewed and adequate, no significant changes in hearing or vision, and occasionally active with exercise.  Does admit to some dietary nocompliacne just in the past month, and expects her lipids and a1c to be elevated.  Did have what sounds like BPV for several days last wk, now resolved.  Peak wt at home 251, now 227 here, some of that more recently.  Ran out of the actosplusmet but had some few pills of metformin. Still see pain management as well.  Has not seen psychiatry recently and has run out of cymbalta and vyvanse Past Medical History  Diagnosis Date  . ALLERGIC RHINITIS 06/26/2008  . ANEMIA-IRON DEFICIENCY 06/26/2008  . 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  . ANKLE PAIN, LEFT 06/26/2008  . BACK PAIN, LUMBAR 10/15/2007  . BIPOLAR DISORDER UNSPECIFIED 09/13/2007  . CERVICAL RADICULOPATHY, LEFT 06/26/2008  . Cervicalgia 10/15/2007  . Chronic pain syndrome 03/21/2011  . COMMON MIGRAINE 06/26/2008  . DIABETES MELLITUS, TYPE II 06/05/2007  . DYSPNEA 12/05/2007  . EAR PAIN, RIGHT 12/05/2007  . ELEVATED BP READING WITHOUT DX HYPERTENSION 07/29/2009  . FATIGUE 12/05/2007  . FIBROMYALGIA 06/26/2008  . GASTROENTERITIS, ACUTE 12/15/2008  . GERD 06/26/2008  . Headache 09/13/2007  .  HYPERLIPIDEMIA 09/13/2007  . JOINT EFFUSION, RIGHT KNEE 07/29/2009  . KNEE PAIN, LEFT 11/21/2010  . LUMBAR RADICULOPATHY, LEFT 06/26/2008  . OTHER DISEASE OF PHARYNX OR NASOPHARYNX 07/03/2008  . PERIPHERAL EDEMA 01/06/2009  . POLYARTHRITIS 11/21/2010  . SHOULDER PAIN, BILATERAL 07/03/2008  . SINUSITIS- ACUTE-NOS 07/29/2009  . TACHYCARDIA 12/05/2007  . THYROID NODULE, RIGHT 11/20/2008  . TREMOR 01/02/2008   Past Surgical History  Procedure Date  . Abdominal hysterectomy     fibroids  . 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 Back surgury     lumbar 2001  . Cesarean section     x 3    reports that she has never smoked. She does not have any smokeless tobacco history on file. She reports that she does not drink alcohol or use illicit drugs. family history includes Asthma in her mother; Cancer in her others; Coronary artery disease in her other; Diabetes in her father and mother; Emphysema in her other; Hyperlipidemia in her father; Hypertension in her father; and Thyroid disease in her mother. Allergies  Allergen Reactions  . Promethazine Hcl   . Sulfonamide Derivatives     REACTION: rash  . Tdap (Adacel) Hives, Itching, Swelling, Anxiety and Rash   Current Outpatient Prescriptions on File Prior to Visit  Medication Sig Dispense Refill  . cyclobenzaprine (FLEXERIL) 5 MG tablet Take 5 mg by mouth 3 (three) times daily as needed. For muscle spasm       . DULoxetine (CYMBALTA) 60 MG capsule Take 60 mg by  mouth daily.        . fluticasone (VERAMYST) 27.5 MCG/SPRAY nasal spray Place 1 spray into the nose daily.        Marland Kitchen glipiZIDE (GLUCOTROL XL) 10 MG 24 hr tablet Take 10 mg by mouth 2 (two) times daily.        Marland Kitchen glucose blood (ACCU-CHEK AVIVA) test strip 1 each by Other route 2 (two) times daily. Use as instructed       . hydrochlorothiazide 25 MG tablet 1/2 by mouth once daily as needed for swelling       . Lancets MISC 2 (two) times daily.        .  cetirizine (ZYRTEC) 10 MG tablet Take 10 mg by mouth daily.        Marland Kitchen lisdexamfetamine (VYVANSE) 70 MG capsule Take 70 mg by mouth daily.        . meloxicam (MOBIC) 15 MG tablet Take 15 mg by mouth daily as needed.        . pioglitazone-metformin (ACTOPLUS MET) 15-500 MG per tablet Take 2 by mouth in the morning       . predniSONE, Pak, (STERAPRED) 5 MG TABS Take as directed       . simvastatin (ZOCOR) 80 MG tablet Take 80 mg by mouth daily.         Review of Systems Review of Systems  Constitutional: Negative for diaphoresis, activity change, appetite change and unexpected weight change.  HENT: Negative for hearing loss, ear pain, facial swelling, mouth sores and neck stiffness.   Eyes: Negative for pain, redness and visual disturbance.  Respiratory: Negative for shortness of breath and wheezing.   Cardiovascular: Negative for chest pain and palpitations.  Gastrointestinal: Negative for diarrhea, blood in stool, abdominal distention and rectal pain.  Genitourinary: Negative for hematuria, flank pain and decreased urine volume.  Musculoskeletal: Negative for myalgias and joint swelling.  Skin: Negative for color change and wound.  Neurological: Negative for syncope and numbness.  Hematological: Negative for adenopathy.  Psychiatric/Behavioral: Negative for hallucinations, self-injury, decreased concentration and agitation.      Objective:   Physical Exam BP 112/82  Pulse 93  Temp(Src) 98 F (36.7 C) (Oral)  Ht 5\' 5"  (1.651 m)  Wt 227 lb 4 oz (103.08 kg)  BMI 37.82 kg/m2  SpO2 96% Physical Exam  VS noted Constitutional: Pt is oriented to person, place, and time. Appears well-developed and well-nourished.  HENT:  Head: Normocephalic and atraumatic.  Right Ear: External ear normal.  Left Ear: External ear normal.  Nose: Nose normal.  Mouth/Throat: Oropharynx is clear and moist.  Eyes: Conjunctivae and EOM are normal. Pupils are equal, round, and reactive to light.  Neck: Normal  range of motion. Neck supple. No JVD present. No tracheal deviation present.  Cardiovascular: Normal rate, regular rhythm, normal heart sounds and intact distal pulses.   Pulmonary/Chest: Effort normal and breath sounds normal.  Abdominal: Soft. Bowel sounds are normal. There is no tenderness.  Musculoskeletal: Normal range of motion. Exhibits no edema.  Lymphadenopathy:  Has no cervical adenopathy.  Neurological: Pt is alert and oriented to person, place, and time. Pt has normal reflexes. No cranial nerve deficit.  Skin: Skin is warm and dry. No rash noted.  Psychiatric:  Has  normal mood and affect. Behavior is normal. 1+ nervous        Assessment & Plan:

## 2011-05-29 NOTE — Telephone Encounter (Signed)
Pharmacy called requesting clarification on HCTZ directions, 1/2 or 1 tab daily? Per Dr Jonny Ruiz 1 po qd. New Rx sent to pharmacy.

## 2011-05-29 NOTE — Assessment & Plan Note (Signed)
Overall doing well, age appropriate education and counseling updated, referrals for preventative services and immunizations addressed, dietary and smoking counseling addressed, most recent labs and ECG reviewed.  I have personally reviewed and have noted: 1) the patient's medical and social history 2) The pt's use of alcohol, tobacco, and illicit drugs 3) The patient's current medications and supplements 4) Functional ability including ADL's, fall risk, home safety risk, hearing and visual impairment 5) Diet and physical activities 6) Evidence for depression or mood disorder 7) The patient's height, weight, and BMI have been recorded in the chart I have made referrals, and provided counseling and education based on review of the above To see GYN as well

## 2011-05-29 NOTE — Patient Instructions (Addendum)
Please remember to followup with your GYN for the yearly pap smear and/or mammogram Please re-start all medications Please return for LAB only in 6 wks;  A1c, lipids, gluc and liver tests (just go to lab) Please call the phone number [phone] (the [employer]Tree System) for results of testing in 2-3 days;  When calling, simply dial the number, and when prompted enter the MRN number above (the Medical Record Number) and the # key, then the message should start. You are given the refills today Please keep your appointments with your specialists as you have planned - pain management, and psychiatry Please return in 6 mo with Lab testing done 3-5 days before

## 2011-11-02 ENCOUNTER — Telehealth: Payer: Self-pay

## 2011-11-02 DIAGNOSIS — R51 Headache: Secondary | ICD-10-CM

## 2011-11-02 NOTE — Telephone Encounter (Signed)
Pt called requesting referral to [facility].

## 2011-11-02 NOTE — Telephone Encounter (Signed)
Done per emr 

## 2011-12-04 ENCOUNTER — Ambulatory Visit: Payer: 59 | Admitting: Internal Medicine

## 2012-01-27 ENCOUNTER — Emergency Department (HOSPITAL_COMMUNITY)
Admission: EM | Admit: 2012-01-27 | Discharge: 2012-01-27 | Disposition: A | Discharge status: Home / Self Care | Payer: 59 | Source: Home / Self Care | Attending: Emergency Medicine | Admitting: Emergency Medicine

## 2012-01-27 ENCOUNTER — Encounter (HOSPITAL_COMMUNITY): Payer: Self-pay | Admitting: *Deleted

## 2012-01-27 DIAGNOSIS — T300 Burn of unspecified body region, unspecified degree: Secondary | ICD-10-CM

## 2012-01-27 MED ORDER — IBUPROFEN 800 MG PO TABS: 800.0000 mg | ORAL_TABLET | Freq: Three times a day (TID) | ORAL | Status: AC
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
## 2012-01-27 NOTE — Discharge Instructions (Signed)
As  discussed use Motrin for the next 24 for 48 hours for pain management. If you've get to develop any blister formations poorly breaking them. At this point in time your burn is superficial and looks and it seems like you're epidermis and not being compromise subtle discomfort should get much better in the next 12 hours. Increase in hygiene by washing her hands frequently during the course of the next 2 days with antibacterial soap
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
## 2012-02-09 ENCOUNTER — Emergency Department (INDEPENDENT_AMBULATORY_CARE_PROVIDER_SITE_OTHER)
Admission: EM | Admit: 2012-02-09 | Discharge: 2012-02-09 | Disposition: A | Discharge status: Home / Self Care | Payer: 59 | Source: Home / Self Care | Attending: Family Medicine | Admitting: Family Medicine

## 2012-02-09 ENCOUNTER — Encounter (HOSPITAL_COMMUNITY): Payer: Self-pay

## 2012-02-09 DIAGNOSIS — T23129A Burn of first degree of unspecified single finger (nail) except thumb, initial encounter: Secondary | ICD-10-CM

## 2012-02-09 NOTE — Discharge Instructions (Signed)
See your doctor if further problems. °
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
## 2012-04-21 ENCOUNTER — Emergency Department (HOSPITAL_COMMUNITY): Payer: 59

## 2012-04-21 ENCOUNTER — Encounter (HOSPITAL_COMMUNITY): Payer: Self-pay | Admitting: *Deleted

## 2012-04-21 ENCOUNTER — Emergency Department (HOSPITAL_COMMUNITY)
Admission: EM | Admit: 2012-04-21 | Discharge: 2012-04-21 | Disposition: A | Discharge status: Home / Self Care | Payer: 59 | Attending: Emergency Medicine | Admitting: Emergency Medicine

## 2012-04-21 DIAGNOSIS — Z90710 Acquired absence of both cervix and uterus: Secondary | ICD-10-CM | POA: Insufficient documentation

## 2012-04-21 DIAGNOSIS — E785 Hyperlipidemia, unspecified: Secondary | ICD-10-CM | POA: Insufficient documentation

## 2012-04-21 DIAGNOSIS — Z8042 Family history of malignant neoplasm of prostate: Secondary | ICD-10-CM | POA: Insufficient documentation

## 2012-04-21 DIAGNOSIS — Z801 Family history of malignant neoplasm of trachea, bronchus and lung: Secondary | ICD-10-CM | POA: Insufficient documentation

## 2012-04-21 DIAGNOSIS — E119 Type 2 diabetes mellitus without complications: Secondary | ICD-10-CM | POA: Insufficient documentation

## 2012-04-21 DIAGNOSIS — R5381 Other malaise: Secondary | ICD-10-CM | POA: Insufficient documentation

## 2012-04-21 DIAGNOSIS — R202 Paresthesia of skin: Secondary | ICD-10-CM

## 2012-04-21 DIAGNOSIS — Z8249 Family history of ischemic heart disease and other diseases of the circulatory system: Secondary | ICD-10-CM | POA: Insufficient documentation

## 2012-04-21 DIAGNOSIS — Z800 Family history of malignant neoplasm of digestive organs: Secondary | ICD-10-CM | POA: Insufficient documentation

## 2012-04-21 DIAGNOSIS — R5383 Other fatigue: Secondary | ICD-10-CM | POA: Insufficient documentation

## 2012-04-21 DIAGNOSIS — R209 Unspecified disturbances of skin sensation: Secondary | ICD-10-CM | POA: Insufficient documentation

## 2012-04-21 DIAGNOSIS — G8929 Other chronic pain: Secondary | ICD-10-CM | POA: Insufficient documentation

## 2012-04-21 DIAGNOSIS — R609 Edema, unspecified: Secondary | ICD-10-CM | POA: Insufficient documentation

## 2012-04-21 DIAGNOSIS — Z888 Allergy status to other drugs, medicaments and biological substances status: Secondary | ICD-10-CM | POA: Insufficient documentation

## 2012-04-21 DIAGNOSIS — R51 Headache: Secondary | ICD-10-CM | POA: Insufficient documentation

## 2012-04-21 DIAGNOSIS — K219 Gastro-esophageal reflux disease without esophagitis: Secondary | ICD-10-CM | POA: Insufficient documentation

## 2012-04-21 DIAGNOSIS — Z825 Family history of asthma and other chronic lower respiratory diseases: Secondary | ICD-10-CM | POA: Insufficient documentation

## 2012-04-21 DIAGNOSIS — Z833 Family history of diabetes mellitus: Secondary | ICD-10-CM | POA: Insufficient documentation

## 2012-04-21 DIAGNOSIS — M5412 Radiculopathy, cervical region: Secondary | ICD-10-CM | POA: Insufficient documentation

## 2012-04-21 DIAGNOSIS — F319 Bipolar disorder, unspecified: Secondary | ICD-10-CM | POA: Insufficient documentation

## 2012-04-21 DIAGNOSIS — Z882 Allergy status to sulfonamides status: Secondary | ICD-10-CM | POA: Insufficient documentation

## 2012-04-21 DIAGNOSIS — IMO0002 Reserved for concepts with insufficient information to code with codable children: Secondary | ICD-10-CM | POA: Insufficient documentation

## 2012-04-21 DIAGNOSIS — Z803 Family history of malignant neoplasm of breast: Secondary | ICD-10-CM | POA: Insufficient documentation

## 2012-04-21 DIAGNOSIS — I10 Essential (primary) hypertension: Secondary | ICD-10-CM | POA: Insufficient documentation

## 2012-04-21 DIAGNOSIS — IMO0001 Reserved for inherently not codable concepts without codable children: Secondary | ICD-10-CM | POA: Insufficient documentation
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
## 2012-04-21 NOTE — Discharge Instructions (Signed)
You have been evaluate for numbness to her legs and your neck pain.  Xray of your neck spine is unremarkable.  Please follow up with your doctor for further care.  Return if your symptoms worsen.   RICE: Routine Care for Injuries The routine care of many injuries includes Rest, Ice, Compression, and Elevation (RICE). HOME CARE INSTRUCTIONS  Rest is needed to allow your body to heal. Routine activities can usually be resumed when comfortable. Injured tendons and bones can take up to 6 weeks to heal. Tendons are the cord-like structures that attach muscle to bone.   Ice following an injury helps keep the swelling down and reduces pain.   Put ice in a plastic bag.   Place a towel between your skin and the bag.   Leave the ice on for 15 to 20 minutes, 3 to 4 times a day. Do this while awake, for the first 24 to 48 hours. After that, continue as directed by your caregiver.   Compression helps keep swelling down. It also gives support and helps with discomfort. If an elastic bandage has been applied, it should be removed and reapplied every 3 to 4 hours. It should not be applied tightly, but firmly enough to keep swelling down. Watch fingers or toes for swelling, bluish discoloration, coldness, numbness, or excessive pain. If any of these problems occur, remove the bandage and reapply loosely. Contact your caregiver if these problems continue.   Elevation helps reduce swelling and decreases pain. With extremities, such as the arms, hands, legs, and feet, the injured area should be placed near or above the level of the heart, if possible.  SEEK IMMEDIATE MEDICAL CARE IF:  You have persistent pain and swelling.   You develop redness, numbness, or unexpected weakness.   Your symptoms are getting worse rather than improving after several days.  These symptoms may indicate that further evaluation or further X-rays are needed. Sometimes, X-rays may not show a small broken bone (fracture) until 1 week  or 10 days later. Make a follow-up appointment with your caregiver. Ask when your X-ray results will be ready. Make sure you get your X-ray results. Document Released: 01/21/2001 Document Revised: 09/28/2011 Document Reviewed: 03/10/2011 [facility] ([facility] Patient Information 2012 [place], [place].
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
## 2012-04-23 NOTE — ED Provider Notes (Signed)
Medical screening examination/treatment/procedure(s) were performed by non-physician practitioner and as supervising physician I was immediately available for consultation/collaboration.  Toy Baker, MD 04/23/12 [phone]

## 2012-07-15 ENCOUNTER — Other Ambulatory Visit: Payer: Self-pay | Admitting: Physician Assistant

## 2012-07-15 DIAGNOSIS — M25561 Pain in right knee: Secondary | ICD-10-CM

## 2012-07-16 ENCOUNTER — Ambulatory Visit
Admission: RE | Admit: 2012-07-16 | Discharge: 2012-07-16 | Disposition: A | Discharge status: Home / Self Care | Payer: 59 | Source: Ambulatory Visit | Attending: Physician Assistant | Admitting: Physician Assistant

## 2012-07-16 DIAGNOSIS — M25561 Pain in right knee: Secondary | ICD-10-CM

## 2012-10-21 ENCOUNTER — Other Ambulatory Visit: Payer: Self-pay | Admitting: Physical Medicine and Rehabilitation

## 2012-10-21 ENCOUNTER — Ambulatory Visit
Admission: RE | Admit: 2012-10-21 | Discharge: 2012-10-21 | Disposition: A | Discharge status: Home / Self Care | Payer: 59 | Source: Ambulatory Visit | Attending: Physical Medicine and Rehabilitation | Admitting: Physical Medicine and Rehabilitation

## 2012-10-21 DIAGNOSIS — R52 Pain, unspecified: Secondary | ICD-10-CM

## 2012-12-09 ENCOUNTER — Other Ambulatory Visit: Payer: Self-pay | Admitting: Physician Assistant

## 2012-12-09 ENCOUNTER — Ambulatory Visit
Admission: RE | Admit: 2012-12-09 | Discharge: 2012-12-09 | Disposition: A | Discharge status: Home / Self Care | Payer: 59 | Source: Ambulatory Visit | Attending: Physician Assistant | Admitting: Physician Assistant

## 2012-12-09 DIAGNOSIS — R52 Pain, unspecified: Secondary | ICD-10-CM

## 2013-01-10 ENCOUNTER — Other Ambulatory Visit: Payer: 59

## 2013-01-10 ENCOUNTER — Other Ambulatory Visit: Payer: Self-pay | Admitting: Pediatrics

## 2013-01-10 DIAGNOSIS — M25552 Pain in left hip: Secondary | ICD-10-CM

## 2013-01-10 DIAGNOSIS — M25551 Pain in right hip: Secondary | ICD-10-CM

## 2013-05-21 ENCOUNTER — Encounter (HOSPITAL_BASED_OUTPATIENT_CLINIC_OR_DEPARTMENT_OTHER): Payer: Self-pay | Admitting: Family Medicine

## 2013-05-21 ENCOUNTER — Emergency Department (HOSPITAL_BASED_OUTPATIENT_CLINIC_OR_DEPARTMENT_OTHER): Payer: 59

## 2013-05-21 ENCOUNTER — Emergency Department (HOSPITAL_BASED_OUTPATIENT_CLINIC_OR_DEPARTMENT_OTHER)
Admission: EM | Admit: 2013-05-21 | Discharge: 2013-05-21 | Disposition: A | Discharge status: Home / Self Care | Payer: 59 | Attending: Emergency Medicine | Admitting: Emergency Medicine

## 2013-05-21 DIAGNOSIS — Z8709 Personal history of other diseases of the respiratory system: Secondary | ICD-10-CM | POA: Insufficient documentation

## 2013-05-21 DIAGNOSIS — E119 Type 2 diabetes mellitus without complications: Secondary | ICD-10-CM | POA: Insufficient documentation

## 2013-05-21 DIAGNOSIS — Z862 Personal history of diseases of the blood and blood-forming organs and certain disorders involving the immune mechanism: Secondary | ICD-10-CM | POA: Insufficient documentation

## 2013-05-21 DIAGNOSIS — Z8739 Personal history of other diseases of the musculoskeletal system and connective tissue: Secondary | ICD-10-CM | POA: Insufficient documentation

## 2013-05-21 DIAGNOSIS — G894 Chronic pain syndrome: Secondary | ICD-10-CM | POA: Insufficient documentation

## 2013-05-21 DIAGNOSIS — R0602 Shortness of breath: Secondary | ICD-10-CM | POA: Insufficient documentation

## 2013-05-21 DIAGNOSIS — Z8719 Personal history of other diseases of the digestive system: Secondary | ICD-10-CM | POA: Insufficient documentation

## 2013-05-21 DIAGNOSIS — Z79899 Other long term (current) drug therapy: Secondary | ICD-10-CM | POA: Insufficient documentation

## 2013-05-21 DIAGNOSIS — R079 Chest pain, unspecified: Secondary | ICD-10-CM

## 2013-05-21 DIAGNOSIS — Z8679 Personal history of other diseases of the circulatory system: Secondary | ICD-10-CM | POA: Insufficient documentation

## 2013-05-21 DIAGNOSIS — Z8639 Personal history of other endocrine, nutritional and metabolic disease: Secondary | ICD-10-CM | POA: Insufficient documentation

## 2013-05-21 LAB — COMPREHENSIVE METABOLIC PANEL
ALT: 15 U/L (ref 0–35)
AST: 15 U/L (ref 0–37)
Albumin: 3.9 g/dL (ref 3.5–5.2)
Alkaline Phosphatase: 88 U/L (ref 39–117)
BUN: 13 mg/dL (ref 6–23)
CO2: 24 mEq/L (ref 19–32)
Calcium: 9.8 mg/dL (ref 8.4–10.5)
Chloride: 101 mEq/L (ref 96–112)
Creatinine, Ser: 0.7 mg/dL (ref 0.50–1.10)
GFR calc Af Amer: >90 mL/min (ref >90)
GFR calc non Af Amer: >90 mL/min (ref >90)
Glucose, Bld: 162 mg/dL — ABNORMAL HIGH (ref 70–99)
Potassium: 3.7 mEq/L (ref 3.5–5.1)
Sodium: 137 mEq/L (ref 135–145)
Total Bilirubin: 0.3 mg/dL (ref 0.3–1.2)
Total Protein: 7.6 g/dL (ref 6.0–8.3)

## 2013-05-21 LAB — D-DIMER, QUANTITATIVE: D-Dimer, Quant: 0.42 ug/mL-FEU (ref 0.00–0.48)

## 2013-05-21 LAB — CBC WITH DIFFERENTIAL/PLATELET
Basophils Absolute: 0 10*3/uL (ref 0.0–0.1)
Basophils Relative: 0 % (ref 0–1)
Eosinophils Absolute: 0.1 10*3/uL (ref 0.0–0.7)
Eosinophils Relative: 1 % (ref 0–5)
HCT: 38.1 % (ref 36.0–46.0)
Hemoglobin: 12.8 g/dL (ref 12.0–15.0)
Lymphocytes Relative: 31 % (ref 12–46)
Lymphs Abs: 2.1 10*3/uL (ref 0.7–4.0)
MCH: 27.9 pg (ref 26.0–34.0)
MCHC: 33.6 g/dL (ref 30.0–36.0)
MCV: 83.2 fL (ref 78.0–100.0)
Monocytes Absolute: 0.6 10*3/uL (ref 0.1–1.0)
Monocytes Relative: 9 % (ref 3–12)
Neutro Abs: 4 10*3/uL (ref 1.7–7.7)
Neutrophils Relative %: 59 % (ref 43–77)
Platelets: 325 10*3/uL (ref 150–400)
RBC: 4.58 MIL/uL (ref 3.87–5.11)
RDW: 14.3 % (ref 11.5–15.5)
WBC: 6.9 10*3/uL (ref 4.0–10.5)

## 2013-05-21 LAB — TROPONIN I
Troponin I: <0.3 ng/mL (ref <0.30)
Troponin I: <0.3 ng/mL (ref <0.30)

## 2013-05-21 MED ORDER — HYDROCODONE-ACETAMINOPHEN 5-325 MG PO TABS
2.0000 | ORAL_TABLET | Freq: Once | ORAL | Status: AC
  Administered 2013-05-21: 2 via ORAL
  Filled 2013-05-21: qty 2

## 2013-05-21 MED ORDER — SODIUM CHLORIDE 0.9 % IV SOLN
Freq: Once | INTRAVENOUS | Status: AC
  Administered 2013-05-21: 19:00:00 via INTRAVENOUS
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
## 2013-05-21 NOTE — ED Provider Notes (Signed)
CSN: [id number]     Arrival date & time 05/21/13  1724 History     First MD Initiated Contact with Patient 05/21/13 1745     Chief Complaint  Patient presents with  . Chest Pain   (Consider location/radiation/quality/duration/timing/severity/associated sxs/prior Treatment) Patient is a 37 y.o. female presenting with chest pain. The history is provided by the patient. No language interpreter was used.  Chest Pain Pain location:  R chest Pain quality: aching and pressure   Pain radiates to:  Does not radiate Pain radiates to the back: no   Pain severity:  Mild Onset quality:  Gradual Timing:  Constant Progression:  Worsening Chronicity:  New Relieved by:  Nothing Worsened by:  Nothing tried Associated symptoms: shortness of breath   Associated symptoms: no fever    Pt reports chest pain began last night during an argument.  Pt reports pain resolved but returned today.   Past Medical History  Diagnosis Date  . ALLERGIC RHINITIS 06/26/2008  . ANEMIA-IRON DEFICIENCY 06/26/2008  . 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  . ANKLE PAIN, LEFT 06/26/2008  . BACK PAIN, LUMBAR 10/15/2007  . BIPOLAR DISORDER UNSPECIFIED 09/13/2007  . CERVICAL RADICULOPATHY, LEFT 06/26/2008  . Cervicalgia 10/15/2007  . Chronic pain syndrome 03/21/2011  . COMMON MIGRAINE 06/26/2008  . DIABETES MELLITUS, TYPE II 06/05/2007  . DYSPNEA 12/05/2007  . EAR PAIN, RIGHT 12/05/2007  . ELEVATED BP READING WITHOUT DX HYPERTENSION 07/29/2009  . FATIGUE 12/05/2007  . FIBROMYALGIA 06/26/2008  . GASTROENTERITIS, ACUTE 12/15/2008  . GERD 06/26/2008  . Headache(784.0) 09/13/2007  . HYPERLIPIDEMIA 09/13/2007  . JOINT EFFUSION, RIGHT KNEE 07/29/2009  . KNEE PAIN, LEFT 11/21/2010  . LUMBAR RADICULOPATHY, LEFT 06/26/2008  . OTHER DISEASE OF PHARYNX OR NASOPHARYNX 07/03/2008  . PERIPHERAL EDEMA 01/06/2009  . POLYARTHRITIS 11/21/2010  . SHOULDER PAIN, BILATERAL 07/03/2008  . SINUSITIS- ACUTE-NOS 07/29/2009  . TACHYCARDIA 12/05/2007  . THYROID NODULE, RIGHT  11/20/2008  . TREMOR 01/02/2008   Past Surgical History  Procedure Laterality Date  . Abdominal hysterectomy      fibroids  . Thyroid fine needle aspiration  May 2008    showed non neoplastic goiter  . Back surgury      lumbar 2001  . Cesarean section      x 3   Family History  Problem Relation Age of Onset  . Thyroid disease Mother   . Diabetes Mother   . Asthma Mother   . Hypertension Father   . Hyperlipidemia Father   . Diabetes Father   . Emphysema Other   . Coronary artery disease Other   . Cancer Other     pancreatic and breast cancer  . Cancer Other     lung and prostate cancer   History  Substance Use Topics  . Smoking status: Never Smoker   . Smokeless tobacco: Not on file  . Alcohol Use: No   OB History   Grav Para Term Preterm Abortions TAB SAB Ect Mult Living                 Review of Systems  Constitutional: Negative for fever.  Respiratory: Positive for shortness of breath.   Cardiovascular: Positive for chest pain.  All other systems reviewed and are negative.    Allergies  Contrast media; Promethazine hcl; Sulfonamide derivatives; and Tdap  Home Medications   Current Outpatient Rx  Name  Route  Sig  Dispense  Refill  . amphetamine-dextroamphetamine (ADDERALL) 20 MG tablet   Oral   Take 20 mg by  mouth 4 (four) times daily - after meals and at bedtime.         . DULoxetine (CYMBALTA) 60 MG capsule   Oral   Take 60 mg by mouth daily.           Marland Kitchen glucose blood (ACCU-CHEK AVIVA) test strip      Use as instructed   200 each   11   . Lancets MISC      Use as directed twice daily   200 each   11    BP 126/75  Pulse 103  Temp(Src) 97.8 F (36.6 C) (Oral)  Resp 18  Ht 5\' 5"  (1.651 m)  Wt 194 lb (87.998 kg)  BMI 32.28 kg/m2  SpO2 100% Physical Exam  Nursing note and vitals reviewed. Constitutional: She appears well-developed.  Cardiovascular: Normal rate and normal heart sounds.   Pulmonary/Chest: Effort normal.   Abdominal: Soft.  Musculoskeletal: Normal range of motion.  Neurological: She is alert.  Skin: Skin is warm.  Psychiatric: She has a normal mood and affect.    ED Course   Procedures (including critical care time)  Labs Reviewed  COMPREHENSIVE METABOLIC PANEL - Abnormal; Notable for the following:    Glucose, Bld 162 (*)    All other components within normal limits  CBC WITH DIFFERENTIAL  TROPONIN I  D-DIMER, QUANTITATIVE   Dg Chest 2 View  05/21/2013   *RADIOLOGY REPORT*  Clinical Data: Chest pain right-sided for 1 day with shortness of breath.  CHEST - 2 VIEW  Comparison: 04/14/2009  Findings: Lungs are adequately inflated without focal consolidation or effusion.  The cardiomediastinal silhouette and remainder of the exam is unchanged.  IMPRESSION: No acute cardiopulmonary disease.   Original Report Authenticated By: Elberta Fortis, M.D.   1. Nonspecific chest pain     Date: 05/21/2013  Rate: 103  Rhythm: sinus tachycardia  QRS Axis: normal  Intervals: normal  ST/T Wave abnormalities: normal  Conduction Disutrbances:none  Narrative Interpretation:   Old EKG Reviewed: unchanged  MDM  See your Physician for recheck in 2-3 days.   Return if any problems.   Lonia Skinner [place], PA-C 05/21/13 2156

## 2013-05-22 NOTE — ED Provider Notes (Signed)
Medical screening examination/treatment/procedure(s) were performed by non-physician practitioner and as supervising physician I was immediately available for consultation/collaboration.   Gwyneth Sprout, MD 05/22/13 [phone]

## 2013-07-09 ENCOUNTER — Encounter: Payer: Self-pay | Admitting: Pulmonary Disease

## 2013-09-26 ENCOUNTER — Encounter (HOSPITAL_COMMUNITY): Payer: Self-pay | Admitting: *Deleted

## 2013-09-26 ENCOUNTER — Inpatient Hospital Stay (HOSPITAL_COMMUNITY)
Admission: AD | Admit: 2013-09-26 | Discharge: 2013-09-26 | Disposition: A | Discharge status: Home / Self Care | Payer: 59 | Source: Ambulatory Visit | Attending: Family Medicine | Admitting: Family Medicine

## 2013-09-26 DIAGNOSIS — N898 Other specified noninflammatory disorders of vagina: Secondary | ICD-10-CM | POA: Insufficient documentation

## 2013-09-26 DIAGNOSIS — B373 Candidiasis of vulva and vagina: Secondary | ICD-10-CM

## 2013-09-26 DIAGNOSIS — B3731 Acute candidiasis of vulva and vagina: Secondary | ICD-10-CM | POA: Insufficient documentation

## 2013-09-26 LAB — URINALYSIS, ROUTINE W REFLEX MICROSCOPIC
Bilirubin Urine: NEGATIVE
Glucose, UA: >1000 mg/dL — AB
Hgb urine dipstick: NEGATIVE
Ketones, ur: NEGATIVE mg/dL
Leukocytes, UA: NEGATIVE
Nitrite: NEGATIVE
Protein, ur: NEGATIVE mg/dL
Specific Gravity, Urine: 1.02 (ref 1.005–1.030)
Urobilinogen, UA: 0.2 mg/dL (ref 0.0–1.0)
pH: 6 (ref 5.0–8.0)

## 2013-09-26 LAB — URINE MICROSCOPIC-ADD ON

## 2013-09-26 LAB — WET PREP, GENITAL
Clue Cells Wet Prep HPF POC: NONE SEEN
Trich, Wet Prep: NONE SEEN
Yeast Wet Prep HPF POC: NONE SEEN

## 2013-09-26 MED ORDER — FLUCONAZOLE 150 MG PO TABS
150.0000 mg | ORAL_TABLET | Freq: Every day | ORAL | Status: DC
  Administered 2013-09-26: 150 mg via ORAL
  Filled 2013-09-26: qty 1

## 2013-09-26 MED ORDER — FLUCONAZOLE 150 MG PO TABS: ORAL_TABLET | ORAL | Status: DC

## 2013-09-26 NOTE — Discharge Instructions (Signed)
Candidal Vulvovaginitis  Candidal vulvovaginitis is an infection of the vagina and vulva. The vulva is the skin around the opening of the vagina. This may cause itching and discomfort in and around the vagina.   HOME CARE  · Only take medicine as told by your doctor.  · Do not have sex (intercourse) until the infection is healed or as told by your doctor.  · Practice safe sex.  · Tell your sex partner about your infection.  · Do not douche or use tampons.  · Wear cotton underwear. Do not wear tight pants or panty hose.  · Eat yogurt. This may help treat and prevent yeast infections.  GET HELP RIGHT AWAY IF:   · You have a fever.  · Your problems get worse during treatment or do not get better in 3 days.  · You have discomfort, irritation, or itching in your vagina or vulva area.  · You have pain after sex.  · You start to get belly (abdominal) pain.  MAKE SURE YOU:  · Understand these instructions.  · Will watch your condition.  · Will get help right away if you are not doing well or get worse.  Document Released: 01/05/2009 Document Revised: 01/01/2012 Document Reviewed: 01/05/2009  [employer]® Patient Information ©2014 [employer].

## 2013-09-26 NOTE — MAU Provider Note (Signed)
Chart reviewed and agree with management and plan.  

## 2013-09-26 NOTE — MAU Note (Signed)
Patient states she has had a vaginal irritation and itch for over one month. Has self treated with OTC medication that will work for a while then comes back. Last night started to have a thick cottage cheese like discharge with vaginal tingling. No pain.

## 2013-09-26 NOTE — MAU Provider Note (Signed)
History     CSN: [id number]  Arrival date and time: 09/26/13 1026   First Provider Initiated Contact with Patient 09/26/13 1140      Chief Complaint  Patient presents with  . Vaginal Discharge   HPI [name] is a 37 y.o. [phone] who presents to MAU today with complaint of vaginal discharge x 1 month. The patient states that she treated herself with OTC Monistat and symptoms improved somewhat, but have now returned. She states that she is having a thick, white, clumpy discharge, vaginal itching and irritation and also noted vaginal odor today. She denies vaginal bleeding, abdominal pain or fever. Patient states that she was a patient of [facility] but now has an outstanding bill with them and cannot return until that has been paid.   OB History   Grav Para Term Preterm Abortions TAB SAB Ect Mult Living   3 3 2 1      3       Past Medical History  Diagnosis Date  . ALLERGIC RHINITIS 06/26/2008  . ANEMIA-IRON DEFICIENCY 06/26/2008  . 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  . ANKLE PAIN, LEFT 06/26/2008  . BACK PAIN, LUMBAR 10/15/2007  . BIPOLAR DISORDER UNSPECIFIED 09/13/2007  . CERVICAL RADICULOPATHY, LEFT 06/26/2008  . Cervicalgia 10/15/2007  . Chronic pain syndrome 03/21/2011  . COMMON MIGRAINE 06/26/2008  . DIABETES MELLITUS, TYPE II 06/05/2007  . DYSPNEA 12/05/2007  . EAR PAIN, RIGHT 12/05/2007  . ELEVATED BP READING WITHOUT DX HYPERTENSION 07/29/2009  . FATIGUE 12/05/2007  . FIBROMYALGIA 06/26/2008  . GASTROENTERITIS, ACUTE 12/15/2008  . GERD 06/26/2008  . Headache(784.0) 09/13/2007  . HYPERLIPIDEMIA 09/13/2007  . JOINT EFFUSION, RIGHT KNEE 07/29/2009  . KNEE PAIN, LEFT 11/21/2010  . LUMBAR RADICULOPATHY, LEFT 06/26/2008  . OTHER DISEASE OF PHARYNX OR NASOPHARYNX 07/03/2008  . PERIPHERAL EDEMA 01/06/2009  . POLYARTHRITIS 11/21/2010  . SHOULDER PAIN, BILATERAL 07/03/2008  . SINUSITIS- ACUTE-NOS 07/29/2009  . TACHYCARDIA 12/05/2007  . THYROID NODULE, RIGHT 11/20/2008  . TREMOR 01/02/2008     Past Surgical History  Procedure Laterality Date  . Abdominal hysterectomy      fibroids  . Thyroid fine needle aspiration  May 2008    showed non neoplastic goiter  . Back surgury      lumbar 2001  . Cesarean section      x 3    Family History  Problem Relation Age of Onset  . Thyroid disease Mother   . Diabetes Mother   . Asthma Mother   . Hypertension Father   . Hyperlipidemia Father   . Diabetes Father   . Emphysema Other   . Coronary artery disease Other   . Cancer Other     pancreatic and breast cancer  . Cancer Other     lung and prostate cancer    History  Substance Use Topics  . Smoking status: Never Smoker   . Smokeless tobacco: Not on file  . Alcohol Use: No    Allergies:  Allergies  Allergen Reactions  . Sulfa Antibiotics Anaphylaxis  . Contrast Media [Iodinated Diagnostic Agents] Itching    Mri, severe heaving ~ can take benadryl without reaction  . Promethazine Hcl     Iv dose with benadryl causes severe aggrivation  . Tdap [Diphth-Acell Pertussis-Tetanus] Hives, Itching, Swelling, Anxiety and Rash    No prescriptions prior to admission    Review of Systems  Constitutional: Negative for fever.  Gastrointestinal: Negative for abdominal pain.  Genitourinary: Negative for dysuria, urgency and frequency.       +  vaginal discharge Neg - vaginal bleeding   Physical Exam   Blood pressure 110/72, pulse 112, temperature 98 F (36.7 C), temperature source Oral, resp. rate 16, height 5\' 4"  (1.626 m), weight 200 lb 6.4 oz (90.901 kg), SpO2 100.00%.  Physical Exam  Constitutional: She is oriented to person, place, and time. She appears well-developed and well-nourished. No distress.  HENT:  Head: Normocephalic and atraumatic.  Cardiovascular: Normal rate.   Respiratory: Effort normal.  Genitourinary: Uterus is not enlarged and not tender. Cervix exhibits no motion tenderness, no discharge and no friability. Right adnexum displays no mass and  no tenderness. Left adnexum displays no mass and no tenderness. No bleeding around the vagina. Vaginal discharge (moderate amount of thick, white discharge noted) found.  Neurological: She is alert and oriented to person, place, and time.  Skin: Skin is warm and dry. No erythema.  Psychiatric: She has a normal mood and affect.   Results for orders placed during the hospital encounter of 09/26/13 (from the past 24 hour(s))  URINALYSIS, ROUTINE W REFLEX MICROSCOPIC     Status: Abnormal   Collection Time    09/26/13 11:11 AM      Result Value Range   Color, Urine YELLOW  YELLOW   APPearance CLEAR  CLEAR   Specific Gravity, Urine 1.020  1.005 - 1.030   pH 6.0  5.0 - 8.0   Glucose, UA >1000 (*) NEGATIVE mg/dL   Hgb urine dipstick NEGATIVE  NEGATIVE   Bilirubin Urine NEGATIVE  NEGATIVE   Ketones, ur NEGATIVE  NEGATIVE mg/dL   Protein, ur NEGATIVE  NEGATIVE mg/dL   Urobilinogen, UA 0.2  0.0 - 1.0 mg/dL   Nitrite NEGATIVE  NEGATIVE   Leukocytes, UA NEGATIVE  NEGATIVE  URINE MICROSCOPIC-ADD ON     Status: Abnormal   Collection Time    09/26/13 11:11 AM      Result Value Range   Squamous Epithelial / LPF FEW (*) RARE   WBC, UA 0-2  <3 WBC/hpf  WET PREP, GENITAL     Status: Abnormal   Collection Time    09/26/13 11:55 AM      Result Value Range   Yeast Wet Prep HPF POC NONE SEEN  NONE SEEN   Trich, Wet Prep NONE SEEN  NONE SEEN   Clue Cells Wet Prep HPF POC NONE SEEN  NONE SEEN   WBC, Wet Prep HPF POC FEW (*) NONE SEEN     MAU Course  Procedures None  MDM Wet prep, GC/Chlamydia today  Assessment and Plan  A: Yeast vulvovaginitis, clinical  P: Discharge home Patient given 150 mg Diflucan in MAU today Rx for Diflucan 150 mg sent to pharmacy to be taken in 3 days Patient advised to follow-up with provider of choice PRN Patient may return to MAU as needed or if her condition were to change or worsen  Freddi Starr, PA-C  09/26/2013, 2:38 PM

## 2013-09-27 LAB — GC/CHLAMYDIA PROBE AMP
CT Probe RNA: NEGATIVE
GC Probe RNA: NEGATIVE

## 2013-10-25 ENCOUNTER — Encounter (HOSPITAL_BASED_OUTPATIENT_CLINIC_OR_DEPARTMENT_OTHER): Payer: Self-pay | Admitting: Emergency Medicine

## 2013-10-25 ENCOUNTER — Emergency Department (HOSPITAL_BASED_OUTPATIENT_CLINIC_OR_DEPARTMENT_OTHER)
Admission: EM | Admit: 2013-10-25 | Discharge: 2013-10-25 | Discharge status: Against Medical Advice | Payer: 59 | Attending: Emergency Medicine | Admitting: Emergency Medicine

## 2013-10-25 DIAGNOSIS — S46909A Unspecified injury of unspecified muscle, fascia and tendon at shoulder and upper arm level, unspecified arm, initial encounter: Secondary | ICD-10-CM | POA: Insufficient documentation

## 2013-10-25 DIAGNOSIS — S4980XA Other specified injuries of shoulder and upper arm, unspecified arm, initial encounter: Secondary | ICD-10-CM | POA: Insufficient documentation

## 2013-10-25 DIAGNOSIS — Y939 Activity, unspecified: Secondary | ICD-10-CM | POA: Insufficient documentation

## 2013-10-25 DIAGNOSIS — E119 Type 2 diabetes mellitus without complications: Secondary | ICD-10-CM | POA: Insufficient documentation

## 2013-10-25 DIAGNOSIS — W010XXA Fall on same level from slipping, tripping and stumbling without subsequent striking against object, initial encounter: Secondary | ICD-10-CM | POA: Insufficient documentation

## 2013-10-25 DIAGNOSIS — IMO0002 Reserved for concepts with insufficient information to code with codable children: Secondary | ICD-10-CM | POA: Insufficient documentation

## 2013-10-25 DIAGNOSIS — Y92009 Unspecified place in unspecified non-institutional (private) residence as the place of occurrence of the external cause: Secondary | ICD-10-CM | POA: Insufficient documentation

## 2013-10-25 NOTE — ED Notes (Signed)
Pt c/o slipping and falling on carpeted steps inside her home last night. Pt denies loc, c/o pain all over but specifically left shoulder and right low back. Pt ambulatory and drove self to ED.  

## 2013-10-25 NOTE — ED Notes (Signed)
Pt told registration that she would come back later.

## 2013-10-26 ENCOUNTER — Encounter (HOSPITAL_BASED_OUTPATIENT_CLINIC_OR_DEPARTMENT_OTHER): Payer: Self-pay | Admitting: Emergency Medicine

## 2013-10-26 ENCOUNTER — Emergency Department (HOSPITAL_BASED_OUTPATIENT_CLINIC_OR_DEPARTMENT_OTHER): Payer: BC Managed Care – PPO

## 2013-10-26 ENCOUNTER — Emergency Department (HOSPITAL_BASED_OUTPATIENT_CLINIC_OR_DEPARTMENT_OTHER)
Admission: EM | Admit: 2013-10-26 | Discharge: 2013-10-26 | Disposition: A | Discharge status: Home / Self Care | Payer: BC Managed Care – PPO | Attending: Emergency Medicine | Admitting: Emergency Medicine

## 2013-10-26 DIAGNOSIS — S4980XA Other specified injuries of shoulder and upper arm, unspecified arm, initial encounter: Secondary | ICD-10-CM | POA: Insufficient documentation

## 2013-10-26 DIAGNOSIS — Z79899 Other long term (current) drug therapy: Secondary | ICD-10-CM | POA: Insufficient documentation

## 2013-10-26 DIAGNOSIS — S46909A Unspecified injury of unspecified muscle, fascia and tendon at shoulder and upper arm level, unspecified arm, initial encounter: Secondary | ICD-10-CM | POA: Insufficient documentation

## 2013-10-26 DIAGNOSIS — Z8669 Personal history of other diseases of the nervous system and sense organs: Secondary | ICD-10-CM | POA: Insufficient documentation

## 2013-10-26 DIAGNOSIS — W19XXXA Unspecified fall, initial encounter: Secondary | ICD-10-CM

## 2013-10-26 DIAGNOSIS — S79929A Unspecified injury of unspecified thigh, initial encounter: Secondary | ICD-10-CM | POA: Insufficient documentation

## 2013-10-26 DIAGNOSIS — E119 Type 2 diabetes mellitus without complications: Secondary | ICD-10-CM | POA: Insufficient documentation

## 2013-10-26 DIAGNOSIS — Y92009 Unspecified place in unspecified non-institutional (private) residence as the place of occurrence of the external cause: Secondary | ICD-10-CM | POA: Insufficient documentation

## 2013-10-26 DIAGNOSIS — S79919A Unspecified injury of unspecified hip, initial encounter: Secondary | ICD-10-CM | POA: Insufficient documentation

## 2013-10-26 DIAGNOSIS — Z862 Personal history of diseases of the blood and blood-forming organs and certain disorders involving the immune mechanism: Secondary | ICD-10-CM | POA: Insufficient documentation

## 2013-10-26 DIAGNOSIS — F909 Attention-deficit hyperactivity disorder, unspecified type: Secondary | ICD-10-CM | POA: Insufficient documentation

## 2013-10-26 DIAGNOSIS — Z8709 Personal history of other diseases of the respiratory system: Secondary | ICD-10-CM | POA: Insufficient documentation

## 2013-10-26 DIAGNOSIS — Z8659 Personal history of other mental and behavioral disorders: Secondary | ICD-10-CM | POA: Insufficient documentation

## 2013-10-26 DIAGNOSIS — W010XXA Fall on same level from slipping, tripping and stumbling without subsequent striking against object, initial encounter: Secondary | ICD-10-CM | POA: Insufficient documentation

## 2013-10-26 DIAGNOSIS — Y9389 Activity, other specified: Secondary | ICD-10-CM | POA: Insufficient documentation

## 2013-10-26 DIAGNOSIS — Z8719 Personal history of other diseases of the digestive system: Secondary | ICD-10-CM | POA: Insufficient documentation

## 2013-10-26 HISTORY — DX: Attention-deficit hyperactivity disorder, unspecified type: F90.9

## 2013-10-26 MED ORDER — TRAMADOL HCL 50 MG PO TABS: 50.0000 mg | ORAL_TABLET | Freq: Four times a day (QID) | ORAL | Status: DC | PRN

## 2013-10-26 NOTE — ED Notes (Signed)
Pt c/o slipping and falling on carpeted steps inside her home last night. Pt denies loc, c/o pain all over but specifically left shoulder and right low back. Pt ambulatory and drove self to ED.

## 2013-10-26 NOTE — ED Notes (Signed)
Ice pack given to patient prior to discharge.

## 2013-10-26 NOTE — ED Provider Notes (Signed)
CSN: [id number]     Arrival date & time 10/26/13  1221 History  This chart was scribed for Carmin Muskrat, MD by Ludger Nutting, ED Scribe. This patient was seen in room MH11/MH11 and the patient's care was started 3:21 PM.    Chief Complaint  Patient presents with  . Shoulder Pain    The history is provided by the patient. No language interpreter was used.    HPI Comments: Shirley [name] is a 38 y.o. female who presents to the Emergency Department complaining of constant left shoulder and left hip pain that began 2 days ago after a fall. She reports slipping and falling down on carpeted stairs in her home. She states the impact was to the left side of her body. She denies head injury or LOC. Pt states the left shoulder pain is worse with holding heavy objects and turning the steering wheel. She states the left hip pain is worse with laying on that side. She has taken OTC pain medication with minimal relief. She denies difficulty ambulating, loss of bowel or bladder function, neck pain, chest pain, confusion, disorientation, nausea, vomiting.   Past Medical History  Diagnosis Date  . ALLERGIC RHINITIS 06/26/2008  . ANEMIA-IRON DEFICIENCY 06/26/2008  . 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  . ANKLE PAIN, LEFT 06/26/2008  . BACK PAIN, LUMBAR 10/15/2007  . BIPOLAR DISORDER UNSPECIFIED 09/13/2007  . CERVICAL RADICULOPATHY, LEFT 06/26/2008  . Cervicalgia 10/15/2007  . Chronic pain syndrome 03/21/2011  . COMMON MIGRAINE 06/26/2008  . DIABETES MELLITUS, TYPE II 06/05/2007  . DYSPNEA 12/05/2007  . EAR PAIN, RIGHT 12/05/2007  . ELEVATED BP READING WITHOUT DX HYPERTENSION 07/29/2009  . FATIGUE 12/05/2007  . FIBROMYALGIA 06/26/2008  . GASTROENTERITIS, ACUTE 12/15/2008  . GERD 06/26/2008  . Headache(784.0) 09/13/2007  . HYPERLIPIDEMIA 09/13/2007  . JOINT EFFUSION, RIGHT KNEE 07/29/2009  . KNEE PAIN, LEFT 11/21/2010  . LUMBAR RADICULOPATHY, LEFT 06/26/2008  . OTHER DISEASE OF PHARYNX OR NASOPHARYNX 07/03/2008  . PERIPHERAL EDEMA  01/06/2009  . POLYARTHRITIS 11/21/2010  . SHOULDER PAIN, BILATERAL 07/03/2008  . SINUSITIS- ACUTE-NOS 07/29/2009  . TACHYCARDIA 12/05/2007  . THYROID NODULE, RIGHT 11/20/2008  . TREMOR 01/02/2008  . ADHD (attention deficit hyperactivity disorder)    Past Surgical History  Procedure Laterality Date  . Abdominal hysterectomy      fibroids  . Thyroid fine needle aspiration  May 2008    showed non neoplastic goiter  . Back surgury      lumbar 2001  . Cesarean section      x 3   Family History  Problem Relation Age of Onset  . Thyroid disease Mother   . Diabetes Mother   . Asthma Mother   . Hypertension Father   . Hyperlipidemia Father   . Diabetes Father   . Emphysema Other   . Coronary artery disease Other   . Cancer Other     pancreatic and breast cancer  . Cancer Other     lung and prostate cancer   History  Substance Use Topics  . Smoking status: Never Smoker   . Smokeless tobacco: Not on file  . Alcohol Use: No   OB History   Grav Para Term Preterm Abortions TAB SAB Ect Mult Living   3 3 2 1      3      Review of Systems  Constitutional:       Per HPI, otherwise negative  HENT:       Per HPI, otherwise negative  Respiratory:  Per HPI, otherwise negative  Cardiovascular: Negative for chest pain.       Per HPI, otherwise negative  Gastrointestinal: Negative for nausea and vomiting.  Endocrine:       Negative aside from HPI  Genitourinary:       Neg aside from HPI   Musculoskeletal: Positive for arthralgias ( left shoulder and left hip pain). Negative for gait problem and neck pain.       Per HPI, otherwise negative  Skin: Negative.   Neurological: Negative for syncope.  Psychiatric/Behavioral: Negative for confusion.    Allergies  Sulfa antibiotics; Contrast media; Promethazine hcl; and Tdap  Home Medications   Current Outpatient Rx  Name  Route  Sig  Dispense  Refill  . glucose blood (ACCU-CHEK AVIVA) test strip      Use as instructed   200  each   11   . Lancets MISC      Use as directed twice daily   200 each   11   . lisdexamfetamine (VYVANSE) 70 MG capsule   Oral   Take 70 mg by mouth daily.          BP 122/85  Pulse 94  Temp(Src) 98.1 F (36.7 C) (Oral)  Resp 16  Ht 5\' 5"  (1.651 m)  Wt 200 lb (90.719 kg)  BMI 33.28 kg/m2  SpO2 98% Physical Exam  Nursing note and vitals reviewed. Constitutional: She is oriented to person, place, and time. She appears well-developed and well-nourished. No distress.  HENT:  Head: Normocephalic and atraumatic.  Eyes: Conjunctivae and EOM are normal.  Neck: Normal range of motion.  No neck tenderness  Cardiovascular: Normal rate, regular rhythm and normal heart sounds.   Pulmonary/Chest: Effort normal and breath sounds normal. No stridor. No respiratory distress.  Abdominal: She exhibits no distension.  Musculoskeletal: Normal range of motion. She exhibits tenderness. She exhibits no edema.  Left hand: Good grip strength. No snuffbox tenderness. Tenderness to the distal radius area.  Left shoulder: No gross deformity but there is bony tenderness to left shoulder. Left scapular tenderness noted. ROM normal.   Tenderness to palpation of the lumbar spine.   Left hip is tender to palpation. Right SI joint is tender to palpation.   Neurological: She is alert and oriented to person, place, and time. No cranial nerve deficit.  Skin: Skin is warm and dry.  Psychiatric: She has a normal mood and affect.    ED Course  Procedures (including critical care time)  DIAGNOSTIC STUDIES: Oxygen Saturation is 98% on RA, normal by my interpretation.    COORDINATION OF CARE: 3:26 PM Discussed treatment plan with pt at bedside and pt agreed to plan.   Labs Review Labs Reviewed - No data to display Imaging Review Dg Shoulder Left  10/26/2013   CLINICAL DATA:  Fall and left shoulder injury.  EXAM: LEFT SHOULDER - 2+ VIEW  COMPARISON:  None.  FINDINGS: Left shoulder is located without  acute fracture. Left AC joint is grossly intact. Visualized left ribs are intact. No evidence for a large left pneumothorax.  IMPRESSION: No acute bone abnormality to the left shoulder.   Electronically Signed   By: Markus Daft M.D.   On: 10/26/2013 12:59    EKG Interpretation   None      5:00 PM Patient improved, aware of all results. MDM  No diagnosis found.  I personally performed the services described in this documentation, which was scribed in my presence. The recorded information has  been reviewed and is accurate.   Patient presents after a fall with pain in multiple areas.  Patient is neurovascularly intact, has no x-ray evidence of fracture, and is ambulatory and in no distress.  Patient was discharged with analgesics, home regimen for pain control, primary care followup as needed    Carmin Muskrat, MD 10/26/13 1700

## 2013-10-26 NOTE — Discharge Instructions (Signed)
As discussed, it is normal to feel pain for several days following a fall.  Please use ibuprofen, 600 mg, 3 times daily for the next 3 days. Please be provided tramadol in addition to the ibuprofen for pain management. Ice packs, use up to 4 times daily will be beneficial as well.  Return for concerning changes in your condition.

## 2013-11-07 ENCOUNTER — Ambulatory Visit (INDEPENDENT_AMBULATORY_CARE_PROVIDER_SITE_OTHER): Payer: BC Managed Care – PPO | Admitting: Internal Medicine

## 2013-11-07 ENCOUNTER — Other Ambulatory Visit (INDEPENDENT_AMBULATORY_CARE_PROVIDER_SITE_OTHER): Payer: BC Managed Care – PPO

## 2013-11-07 ENCOUNTER — Ambulatory Visit (INDEPENDENT_AMBULATORY_CARE_PROVIDER_SITE_OTHER)
Admission: RE | Admit: 2013-11-07 | Discharge: 2013-11-07 | Disposition: A | Discharge status: Home / Self Care | Payer: BC Managed Care – PPO | Source: Ambulatory Visit | Attending: Internal Medicine | Admitting: Internal Medicine

## 2013-11-07 ENCOUNTER — Encounter: Payer: Self-pay | Admitting: Internal Medicine

## 2013-11-07 VITALS — BP 122/86 | HR 95 | Temp 98.2°F | Ht 65.0 in | Wt 198.5 lb

## 2013-11-07 DIAGNOSIS — IMO0001 Reserved for inherently not codable concepts without codable children: Secondary | ICD-10-CM

## 2013-11-07 DIAGNOSIS — R109 Unspecified abdominal pain: Secondary | ICD-10-CM

## 2013-11-07 DIAGNOSIS — M25512 Pain in left shoulder: Secondary | ICD-10-CM

## 2013-11-07 DIAGNOSIS — M25519 Pain in unspecified shoulder: Secondary | ICD-10-CM

## 2013-11-07 DIAGNOSIS — M7542 Impingement syndrome of left shoulder: Secondary | ICD-10-CM | POA: Insufficient documentation

## 2013-11-07 HISTORY — DX: Impingement syndrome of left shoulder: M75.42

## 2013-11-07 LAB — URINALYSIS, ROUTINE W REFLEX MICROSCOPIC
Bilirubin Urine: NEGATIVE
Hgb urine dipstick: NEGATIVE
Ketones, ur: NEGATIVE
Leukocytes, UA: NEGATIVE
Nitrite: NEGATIVE
RBC / HPF: NONE SEEN (ref 0–?)
Specific Gravity, Urine: 1.015 (ref 1.000–1.030)
Total Protein, Urine: NEGATIVE
Urine Glucose: >=1000 — AB
Urobilinogen, UA: 0.2 (ref 0.0–1.0)
WBC, UA: NONE SEEN (ref 0–?)
pH: 5.5 (ref 5.0–8.0)

## 2013-11-07 MED ORDER — NAPROXEN 500 MG PO TABS: 500.0000 mg | ORAL_TABLET | Freq: Two times a day (BID) | ORAL | Status: DC

## 2013-11-07 NOTE — Progress Notes (Signed)
Pre-visit discussion using our clinic review tool. No additional management support is needed unless otherwise documented below in the visit note.  

## 2013-11-07 NOTE — Patient Instructions (Signed)
Please take all new medication as prescribed - the antiinflammatory  Please go to the LAB in the Basement (turn left off the elevator) for the tests to be done today - just the urine test today  Please go to the XRAY Department in the Basement (go straight as you get off the elevator) for the x-ray testing  You will be contacted by phone if any changes need to be made immediately.  Otherwise, you will receive a letter about your results with an explanation, but please check with MyChart first.  Please call if not improved in 3-5 days (or worse after you go back to work) for an appt with Dr Tamala Julian for the left shoulder

## 2013-11-07 NOTE — Progress Notes (Signed)
Subjective:    Patient ID: Shirley [name], female    DOB: 01/10/1976, 38 y.o.   MRN: [id number]  HPI  Here after being seen for recent fall to left side primarily pt states today to left shoulder and left flank; mult films neg for acute in ER, still c/o left flank pain, no hematuria and Denies urinary symptoms such as dysuria, frequency, urgency, flank pain, hematuria or n/v, fever, chills.  Pt denies chest pain, increased sob or doe, wheezing, orthopnea, PND, increased LE swelling, palpitations, dizziness or syncope. Pt denies new neurological symptoms such as new headache, or facial or extremity weakness or numbness   Pt denies polydipsia, polyuria. Past Medical History  Diagnosis Date  . ALLERGIC RHINITIS 06/26/2008  . ANEMIA-IRON DEFICIENCY 06/26/2008  . 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  . ANKLE PAIN, LEFT 06/26/2008  . BACK PAIN, LUMBAR 10/15/2007  . BIPOLAR DISORDER UNSPECIFIED 09/13/2007  . CERVICAL RADICULOPATHY, LEFT 06/26/2008  . Cervicalgia 10/15/2007  . Chronic pain syndrome 03/21/2011  . COMMON MIGRAINE 06/26/2008  . DIABETES MELLITUS, TYPE II 06/05/2007  . DYSPNEA 12/05/2007  . EAR PAIN, RIGHT 12/05/2007  . ELEVATED BP READING WITHOUT DX HYPERTENSION 07/29/2009  . FATIGUE 12/05/2007  . FIBROMYALGIA 06/26/2008  . GASTROENTERITIS, ACUTE 12/15/2008  . GERD 06/26/2008  . Headache(784.0) 09/13/2007  . HYPERLIPIDEMIA 09/13/2007  . JOINT EFFUSION, RIGHT KNEE 07/29/2009  . KNEE PAIN, LEFT 11/21/2010  . LUMBAR RADICULOPATHY, LEFT 06/26/2008  . OTHER DISEASE OF PHARYNX OR NASOPHARYNX 07/03/2008  . PERIPHERAL EDEMA 01/06/2009  . POLYARTHRITIS 11/21/2010  . SHOULDER PAIN, BILATERAL 07/03/2008  . SINUSITIS- ACUTE-NOS 07/29/2009  . TACHYCARDIA 12/05/2007  . THYROID NODULE, RIGHT 11/20/2008  . TREMOR 01/02/2008  . ADHD (attention deficit hyperactivity disorder)    Past Surgical History  Procedure Laterality Date  . Abdominal hysterectomy      fibroids  . Thyroid fine needle aspiration  May 2008    showed non  neoplastic goiter  . Back surgury      lumbar 2001  . Cesarean section      x 3    reports that she has never smoked. She does not have any smokeless tobacco history on file. She reports that she does not drink alcohol or use illicit drugs. family history includes Asthma in her mother; Cancer in her other and other; Coronary artery disease in her other; Diabetes in her father and mother; Emphysema in her other; Hyperlipidemia in her father; Hypertension in her father; Thyroid disease in her mother. Allergies  Allergen Reactions  . Sulfa Antibiotics Anaphylaxis  . Contrast Media [Iodinated Diagnostic Agents] Itching    Mri, severe heaving ~ can take benadryl without reaction  . Promethazine Hcl     Iv dose with benadryl causes severe aggrivation  . Tdap [Diphth-Acell Pertussis-Tetanus] Hives, Itching, Swelling, Anxiety and Rash   Current Outpatient Prescriptions on File Prior to Visit  Medication Sig Dispense Refill  . glucose blood (ACCU-CHEK AVIVA) test strip Use as instructed  200 each  11  . Lancets MISC Use as directed twice daily  200 each  11  . lisdexamfetamine (VYVANSE) 70 MG capsule Take 70 mg by mouth daily.      . traMADol (ULTRAM) 50 MG tablet Take 1 tablet (50 mg total) by mouth every 6 (six) hours as needed.  15 tablet  0   No current facility-administered medications on file prior to visit.   Review of Systems  Constitutional: Negative for unexpected weight change, or unusual diaphoresis  HENT: Negative for  tinnitus.   Eyes: Negative for photophobia and visual disturbance.  Respiratory: Negative for choking and stridor.   Gastrointestinal: Negative for vomiting and blood in stool.  Genitourinary: Negative for hematuria and decreased urine volume.  Musculoskeletal: Negative for acute joint swelling Skin: Negative for color change and wound.  Neurological: Negative for tremors and numbness other than noted  Psychiatric/Behavioral: Negative for decreased concentration  or  hyperactivity.       Objective:   Physical Exam BP 122/86  Pulse 95  Temp(Src) 98.2 F (36.8 C) (Oral)  Ht 5\' 5"  (1.651 m)  Wt 198 lb 8 oz (90.039 kg)  BMI 33.03 kg/m2  SpO2 98% VS noted,  Constitutional: Pt appears well-developed and well-nourished.  HENT: Head: NCAT.  Right Ear: External ear normal.  Left Ear: External ear normal.  Eyes: Conjunctivae and EOM are normal. Pupils are equal, round, and reactive to light.  Neck: Normal range of motion. Neck supple.  Cardiovascular: Normal rate and regular rhythm.   Pulmonary/Chest: Effort normal and breath sounds normal.  Abd:  Soft, NT, non-distended, + BS, has mild left flank tender Neurological: Pt is alert. Not confused , motor 5/5 Skin: Skin is warm. No erythema.  Left shoulder NT, FROMk no swelling No worsening trigger point tenderness Psychiatric: Pt behavior is normal. Thought content normal.     Assessment & Plan:

## 2013-11-08 NOTE — Assessment & Plan Note (Signed)
Ow stable,  to f/u any worsening symptoms or concerns ?

## 2013-11-08 NOTE — Assessment & Plan Note (Signed)
I suspect MSk, but to check UA, rib films to ro other

## 2013-11-08 NOTE — Assessment & Plan Note (Signed)
Neg recent film, for pain control, to see Dr Smith/sport med for persistent pain or decreasing ROM

## 2013-11-15 ENCOUNTER — Encounter (HOSPITAL_BASED_OUTPATIENT_CLINIC_OR_DEPARTMENT_OTHER): Payer: Self-pay | Admitting: Emergency Medicine

## 2013-11-15 ENCOUNTER — Emergency Department (HOSPITAL_BASED_OUTPATIENT_CLINIC_OR_DEPARTMENT_OTHER): Payer: BC Managed Care – PPO

## 2013-11-15 ENCOUNTER — Emergency Department (HOSPITAL_BASED_OUTPATIENT_CLINIC_OR_DEPARTMENT_OTHER)
Admission: EM | Admit: 2013-11-15 | Discharge: 2013-11-15 | Disposition: A | Discharge status: Home / Self Care | Payer: BC Managed Care – PPO | Attending: Emergency Medicine | Admitting: Emergency Medicine

## 2013-11-15 DIAGNOSIS — Z79899 Other long term (current) drug therapy: Secondary | ICD-10-CM | POA: Insufficient documentation

## 2013-11-15 DIAGNOSIS — W19XXXA Unspecified fall, initial encounter: Secondary | ICD-10-CM

## 2013-11-15 DIAGNOSIS — G894 Chronic pain syndrome: Secondary | ICD-10-CM | POA: Insufficient documentation

## 2013-11-15 DIAGNOSIS — R51 Headache: Secondary | ICD-10-CM

## 2013-11-15 DIAGNOSIS — Z8719 Personal history of other diseases of the digestive system: Secondary | ICD-10-CM | POA: Insufficient documentation

## 2013-11-15 DIAGNOSIS — S0990XA Unspecified injury of head, initial encounter: Secondary | ICD-10-CM | POA: Insufficient documentation

## 2013-11-15 DIAGNOSIS — E119 Type 2 diabetes mellitus without complications: Secondary | ICD-10-CM | POA: Insufficient documentation

## 2013-11-15 DIAGNOSIS — S199XXA Unspecified injury of neck, initial encounter: Secondary | ICD-10-CM

## 2013-11-15 DIAGNOSIS — Y929 Unspecified place or not applicable: Secondary | ICD-10-CM | POA: Insufficient documentation

## 2013-11-15 DIAGNOSIS — M542 Cervicalgia: Secondary | ICD-10-CM

## 2013-11-15 DIAGNOSIS — S0993XA Unspecified injury of face, initial encounter: Secondary | ICD-10-CM | POA: Insufficient documentation

## 2013-11-15 DIAGNOSIS — IMO0001 Reserved for inherently not codable concepts without codable children: Secondary | ICD-10-CM | POA: Insufficient documentation

## 2013-11-15 DIAGNOSIS — Z862 Personal history of diseases of the blood and blood-forming organs and certain disorders involving the immune mechanism: Secondary | ICD-10-CM | POA: Insufficient documentation

## 2013-11-15 DIAGNOSIS — F909 Attention-deficit hyperactivity disorder, unspecified type: Secondary | ICD-10-CM | POA: Insufficient documentation

## 2013-11-15 DIAGNOSIS — W010XXA Fall on same level from slipping, tripping and stumbling without subsequent striking against object, initial encounter: Secondary | ICD-10-CM | POA: Insufficient documentation

## 2013-11-15 DIAGNOSIS — Z8709 Personal history of other diseases of the respiratory system: Secondary | ICD-10-CM | POA: Insufficient documentation

## 2013-11-15 DIAGNOSIS — Z8679 Personal history of other diseases of the circulatory system: Secondary | ICD-10-CM | POA: Insufficient documentation

## 2013-11-15 DIAGNOSIS — R519 Headache, unspecified: Secondary | ICD-10-CM

## 2013-11-15 DIAGNOSIS — W108XXA Fall (on) (from) other stairs and steps, initial encounter: Secondary | ICD-10-CM | POA: Insufficient documentation

## 2013-11-15 DIAGNOSIS — Y939 Activity, unspecified: Secondary | ICD-10-CM | POA: Insufficient documentation

## 2013-11-15 DIAGNOSIS — R110 Nausea: Secondary | ICD-10-CM | POA: Insufficient documentation

## 2013-11-15 DIAGNOSIS — IMO0002 Reserved for concepts with insufficient information to code with codable children: Secondary | ICD-10-CM | POA: Insufficient documentation

## 2013-11-15 MED ORDER — NAPROXEN 250 MG PO TABS
500.0000 mg | ORAL_TABLET | Freq: Once | ORAL | Status: AC
  Administered 2013-11-15: 500 mg via ORAL
  Filled 2013-11-15: qty 2

## 2013-11-15 MED ORDER — NAPROXEN 500 MG PO TABS: 500.0000 mg | ORAL_TABLET | Freq: Two times a day (BID) | ORAL | Status: DC

## 2013-11-15 NOTE — ED Notes (Signed)
Report continued intermittent nausea, headaches from a fall on January 2nd, 2015.  This episode started this am.  Denies blurred vision, balance problems at this time.

## 2013-11-15 NOTE — ED Provider Notes (Signed)
CSN: [id number]     Arrival date & time 11/15/13  1746 History  This chart was scribed for Mervin Kung, MD by Eugenia Mcalpine, ED Scribe. This patient was seen in room MH10/MH10 and the patient's care was started at 7:01 PM.   Chief Complaint  Patient presents with  . Headache   (Consider location/radiation/quality/duration/timing/severity/associated sxs/prior Treatment) Patient is a 38 y.o. female presenting with headaches. The history is provided by the patient. No language interpreter was used.  Headache Associated symptoms: back pain, nausea and neck pain   Associated symptoms: no abdominal pain, no cough, no diarrhea, no dizziness, no fever, no sore throat and no vomiting    HPI Comments: Shirley [name] is a 38 y.o. female with a hx of migraines who presents to the Emergency Department complaining of intermittent nausea, and 6/10 sharp headaches after a fall on January 2nd. She tripped and fell down a flight of stairs hurting her left side; she denies LOC during the fall. Pt states that her right sided HA started around 4 am this morning that she thought was a sinus infection that radiates down the right side of her neck.this pain does not remind her of the migraines that she used to have; the migraines were temporal.This episode began this morning. She denies vision changes, or trouble with her balance. She denies nasuea, vomitting, fever, or effect to her nilateral arms  Cathlean Cower, MD  Past Medical History  Diagnosis Date  . ALLERGIC RHINITIS 06/26/2008  . ANEMIA-IRON DEFICIENCY 06/26/2008  . 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  . ANKLE PAIN, LEFT 06/26/2008  . BACK PAIN, LUMBAR 10/15/2007  . BIPOLAR DISORDER UNSPECIFIED 09/13/2007  . CERVICAL RADICULOPATHY, LEFT 06/26/2008  . Cervicalgia 10/15/2007  . Chronic pain syndrome 03/21/2011  . COMMON MIGRAINE 06/26/2008  . DIABETES MELLITUS, TYPE II 06/05/2007  . DYSPNEA 12/05/2007  . EAR PAIN, RIGHT 12/05/2007  . ELEVATED BP READING WITHOUT DX  HYPERTENSION 07/29/2009  . FATIGUE 12/05/2007  . FIBROMYALGIA 06/26/2008  . GASTROENTERITIS, ACUTE 12/15/2008  . GERD 06/26/2008  . Headache(784.0) 09/13/2007  . HYPERLIPIDEMIA 09/13/2007  . JOINT EFFUSION, RIGHT KNEE 07/29/2009  . KNEE PAIN, LEFT 11/21/2010  . LUMBAR RADICULOPATHY, LEFT 06/26/2008  . OTHER DISEASE OF PHARYNX OR NASOPHARYNX 07/03/2008  . PERIPHERAL EDEMA 01/06/2009  . POLYARTHRITIS 11/21/2010  . SHOULDER PAIN, BILATERAL 07/03/2008  . SINUSITIS- ACUTE-NOS 07/29/2009  . TACHYCARDIA 12/05/2007  . THYROID NODULE, RIGHT 11/20/2008  . TREMOR 01/02/2008  . ADHD (attention deficit hyperactivity disorder)    Past Surgical History  Procedure Laterality Date  . Abdominal hysterectomy      fibroids  . Thyroid fine needle aspiration  May 2008    showed non neoplastic goiter  . Back surgury      lumbar 2001  . Cesarean section      x 3   Family History  Problem Relation Age of Onset  . Thyroid disease Mother   . Diabetes Mother   . Asthma Mother   . Hypertension Father   . Hyperlipidemia Father   . Diabetes Father   . Emphysema Other   . Coronary artery disease Other   . Cancer Other     pancreatic and breast cancer  . Cancer Other     lung and prostate cancer   History  Substance Use Topics  . Smoking status: Never Smoker   . Smokeless tobacco: Not on file  . Alcohol Use: No   OB History   Grav Para Term Preterm Abortions TAB SAB  Ect Mult Living   3 3 2 1      3      Review of Systems  Constitutional: Negative for fever and chills.  HENT: Negative for rhinorrhea and sore throat.   Eyes: Negative for visual disturbance.  Respiratory: Negative for cough and shortness of breath.   Cardiovascular: Negative for chest pain and leg swelling.  Gastrointestinal: Positive for nausea. Negative for vomiting, abdominal pain and diarrhea.  Genitourinary: Negative for dysuria.  Musculoskeletal: Positive for back pain and neck pain.  Skin: Negative for rash.  Neurological: Positive  for headaches. Negative for dizziness.  Psychiatric/Behavioral: Negative for confusion.    Allergies  Sulfa antibiotics; Contrast media; Promethazine hcl; and Tdap  Home Medications   Current Outpatient Rx  Name  Route  Sig  Dispense  Refill  . glucose blood (ACCU-CHEK AVIVA) test strip      Use as instructed   200 each   11   . Lancets MISC      Use as directed twice daily   200 each   11   . lisdexamfetamine (VYVANSE) 70 MG capsule   Oral   Take 70 mg by mouth daily.         . naproxen (NAPROSYN) 500 MG tablet   Oral   Take 1 tablet (500 mg total) by mouth 2 (two) times daily with a meal. As needed for pain   60 tablet   2   . naproxen (NAPROSYN) 500 MG tablet   Oral   Take 1 tablet (500 mg total) by mouth 2 (two) times daily.   14 tablet   0   . traMADol (ULTRAM) 50 MG tablet   Oral   Take 1 tablet (50 mg total) by mouth every 6 (six) hours as needed.   15 tablet   0    BP 124/79  Pulse 111  Temp(Src) 98.2 F (36.8 C) (Oral)  Resp 18  Ht 5\' 5"  (1.651 m)  Wt 198 lb (89.812 kg)  BMI 32.95 kg/m2  SpO2 100% Physical Exam  Nursing note and vitals reviewed. Constitutional: She is oriented to person, place, and time. She appears well-developed and well-nourished. No distress.  HENT:  Head: Normocephalic and atraumatic.  Eyes: EOM are normal. No scleral icterus.  Neck: Neck supple. No tracheal deviation present.  Cardiovascular: Normal rate and regular rhythm.   Pulses:      Radial pulses are 2+ on the left side.  Cap. Refill <3's  Pulmonary/Chest: Effort normal and breath sounds normal. No respiratory distress.  Abdominal: Soft. Bowel sounds are normal. There is no tenderness.  Musculoskeletal: Normal range of motion. She exhibits tenderness. She exhibits no edema.  tender to left para cervical muscles  Lymphadenopathy:    She has no cervical adenopathy.  Neurological: She is alert and oriented to person, place, and time. No cranial nerve deficit.  She exhibits normal muscle tone. Coordination normal.  Skin: Skin is warm and dry. No rash noted.  Psychiatric: She has a normal mood and affect. Her behavior is normal.    ED Course  Procedures (including critical care time)  DIAGNOSTIC STUDIES: Oxygen Saturation is 100% on RA, normal by my interpretation.    COORDINATION OF CARE: 7:07 PM- Pt advised of plan for treatment CT scan of her head and pt agrees.    Labs Review Labs Reviewed - No data to display Imaging Review Ct Head Wo Contrast  11/15/2013   CLINICAL DATA:  Headache  EXAM: CT HEAD  WITHOUT CONTRAST  CT CERVICAL SPINE WITHOUT CONTRAST  TECHNIQUE: Multidetector CT imaging of the head and cervical spine was performed following the standard protocol without intravenous contrast. Multiplanar CT image reconstructions of the cervical spine were also generated.  COMPARISON:  04/14/2009  FINDINGS: CT HEAD FINDINGS  No acute cortical infarct, hemorrhage, or mass lesion ispresent. Ventricles are of normal size. No significant extra-axial fluid collection is present. The paranasal sinuses andmastoid air cells are clear. The osseous skull is intact.  CT CERVICAL SPINE FINDINGS  There is normal alignment of the cervical spine. Disk spaces are normal and there is no significant disk degeneration. No spondylosis is identified and there is no spinal or foraminal stenosis. There is no prevertebral soft tissue thickening.  No fracture is identified in the cervical spine. No mass lesion is present.  IMPRESSION: 1. No acute intracranial abnormalities. 2. No evidence for cervical spine fracture or dislocation.   Electronically Signed   By: Kerby Moors M.D.   On: 11/15/2013 20:19   Ct Cervical Spine Wo Contrast  11/15/2013   CLINICAL DATA:  Headache  EXAM: CT HEAD WITHOUT CONTRAST  CT CERVICAL SPINE WITHOUT CONTRAST  TECHNIQUE: Multidetector CT imaging of the head and cervical spine was performed following the standard protocol without intravenous  contrast. Multiplanar CT image reconstructions of the cervical spine were also generated.  COMPARISON:  04/14/2009  FINDINGS: CT HEAD FINDINGS  No acute cortical infarct, hemorrhage, or mass lesion ispresent. Ventricles are of normal size. No significant extra-axial fluid collection is present. The paranasal sinuses andmastoid air cells are clear. The osseous skull is intact.  CT CERVICAL SPINE FINDINGS  There is normal alignment of the cervical spine. Disk spaces are normal and there is no significant disk degeneration. No spondylosis is identified and there is no spinal or foraminal stenosis. There is no prevertebral soft tissue thickening.  No fracture is identified in the cervical spine. No mass lesion is present.  IMPRESSION: 1. No acute intracranial abnormalities. 2. No evidence for cervical spine fracture or dislocation.   Electronically Signed   By: Kerby Moors M.D.   On: 11/15/2013 20:19    EKG Interpretation   None       MDM   1. Fall   2. Headache   3. Neck pain    Patient with a fall on January 2 since that time has had an occasional headache and neck pain. Was concerned about an injury from the fall. Patient also had injury to the shoulder which is improving. Head CT and neck CT negative. Examination negative for any significant findings. No symptoms currently.   I personally performed the services described in this documentation, which was scribed in my presence. The recorded information has been reviewed and is accurate.     Mervin Kung, MD 11/15/13 2120

## 2013-11-15 NOTE — Discharge Instructions (Signed)
Workup to include head CT and CT of the neck without any significant findings. Followup with your Dr. if symptoms persist. Return for any new or worse symptoms. Recommend taking Naprosyn as needed when the pain develops or for the next several days.

## 2013-12-25 ENCOUNTER — Ambulatory Visit: Payer: BC Managed Care – PPO | Admitting: Internal Medicine

## 2013-12-26 ENCOUNTER — Ambulatory Visit: Payer: BC Managed Care – PPO | Admitting: Internal Medicine

## 2013-12-26 ENCOUNTER — Ambulatory Visit: Payer: BC Managed Care – PPO | Admitting: Physician Assistant

## 2013-12-30 ENCOUNTER — Ambulatory Visit: Payer: BC Managed Care – PPO | Admitting: Internal Medicine

## 2013-12-31 ENCOUNTER — Ambulatory Visit: Payer: BC Managed Care – PPO | Admitting: Internal Medicine

## 2014-04-12 ENCOUNTER — Encounter (HOSPITAL_COMMUNITY): Payer: Self-pay | Admitting: Emergency Medicine

## 2014-04-12 ENCOUNTER — Emergency Department (HOSPITAL_COMMUNITY)
Admission: EM | Admit: 2014-04-12 | Discharge: 2014-04-12 | Disposition: A | Discharge status: Home / Self Care | Payer: BC Managed Care – PPO | Attending: Emergency Medicine | Admitting: Emergency Medicine

## 2014-04-12 DIAGNOSIS — Z8719 Personal history of other diseases of the digestive system: Secondary | ICD-10-CM | POA: Insufficient documentation

## 2014-04-12 DIAGNOSIS — Z862 Personal history of diseases of the blood and blood-forming organs and certain disorders involving the immune mechanism: Secondary | ICD-10-CM | POA: Insufficient documentation

## 2014-04-12 DIAGNOSIS — Z8709 Personal history of other diseases of the respiratory system: Secondary | ICD-10-CM | POA: Insufficient documentation

## 2014-04-12 DIAGNOSIS — R000 Tachycardia, unspecified: Secondary | ICD-10-CM | POA: Insufficient documentation

## 2014-04-12 DIAGNOSIS — F909 Attention-deficit hyperactivity disorder, unspecified type: Secondary | ICD-10-CM | POA: Insufficient documentation

## 2014-04-12 DIAGNOSIS — Z79899 Other long term (current) drug therapy: Secondary | ICD-10-CM | POA: Insufficient documentation

## 2014-04-12 DIAGNOSIS — F419 Anxiety disorder, unspecified: Secondary | ICD-10-CM

## 2014-04-12 DIAGNOSIS — E119 Type 2 diabetes mellitus without complications: Secondary | ICD-10-CM | POA: Insufficient documentation

## 2014-04-12 DIAGNOSIS — Z8679 Personal history of other diseases of the circulatory system: Secondary | ICD-10-CM | POA: Insufficient documentation

## 2014-04-12 DIAGNOSIS — R0789 Other chest pain: Secondary | ICD-10-CM | POA: Insufficient documentation

## 2014-04-12 DIAGNOSIS — F411 Generalized anxiety disorder: Secondary | ICD-10-CM | POA: Insufficient documentation

## 2014-04-12 DIAGNOSIS — Z8739 Personal history of other diseases of the musculoskeletal system and connective tissue: Secondary | ICD-10-CM | POA: Insufficient documentation

## 2014-04-12 DIAGNOSIS — Z791 Long term (current) use of non-steroidal anti-inflammatories (NSAID): Secondary | ICD-10-CM | POA: Insufficient documentation

## 2014-04-12 MED ORDER — LORAZEPAM 1 MG PO TABS: 1.0000 mg | ORAL_TABLET | Freq: Three times a day (TID) | ORAL | Status: DC | PRN

## 2014-04-12 MED ORDER — LORAZEPAM 1 MG PO TABS
1.0000 mg | ORAL_TABLET | Freq: Once | ORAL | Status: AC
  Administered 2014-04-12: 1 mg via ORAL
  Filled 2014-04-12: qty 1
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
## 2014-04-12 NOTE — Discharge Instructions (Signed)

## 2014-04-12 NOTE — ED Notes (Signed)
Per EMS: pt c/o chest pain and anxiety.
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
## 2014-04-23 ENCOUNTER — Telehealth: Payer: Self-pay | Admitting: *Deleted

## 2014-04-23 DIAGNOSIS — E119 Type 2 diabetes mellitus without complications: Secondary | ICD-10-CM

## 2014-04-23 NOTE — Telephone Encounter (Signed)
Left message on machine for patient to schedule an appointment. A1c, bmet, lipid ordered.

## 2014-05-20 ENCOUNTER — Encounter (HOSPITAL_COMMUNITY): Payer: Self-pay | Admitting: General Practice

## 2014-05-20 ENCOUNTER — Inpatient Hospital Stay (HOSPITAL_COMMUNITY)
Admission: AD | Admit: 2014-05-20 | Discharge: 2014-05-20 | Disposition: A | Discharge status: Home / Self Care | Payer: BC Managed Care – PPO | Source: Ambulatory Visit | Attending: Obstetrics & Gynecology | Admitting: Obstetrics & Gynecology

## 2014-05-20 DIAGNOSIS — B373 Candidiasis of vulva and vagina: Secondary | ICD-10-CM | POA: Insufficient documentation

## 2014-05-20 DIAGNOSIS — B3731 Acute candidiasis of vulva and vagina: Secondary | ICD-10-CM | POA: Insufficient documentation

## 2014-05-20 DIAGNOSIS — B372 Candidiasis of skin and nail: Secondary | ICD-10-CM

## 2014-05-20 LAB — WET PREP, GENITAL
Clue Cells Wet Prep HPF POC: NONE SEEN
Trich, Wet Prep: NONE SEEN
Yeast Wet Prep HPF POC: NONE SEEN

## 2014-05-20 MED ORDER — FLUCONAZOLE 150 MG PO TABS
150.0000 mg | ORAL_TABLET | Freq: Once | ORAL | Status: AC
  Administered 2014-05-20: 150 mg via ORAL
  Filled 2014-05-20: qty 1

## 2014-05-20 MED ORDER — TRIAMCINOLONE ACETONIDE 0.1 % EX CREA: 1.0000 "application " | TOPICAL_CREAM | Freq: Two times a day (BID) | CUTANEOUS | Status: DC

## 2014-05-20 MED ORDER — NYSTATIN 100000 UNIT/GM EX CREA: TOPICAL_CREAM | CUTANEOUS | Status: DC

## 2014-05-20 NOTE — MAU Provider Note (Signed)
Attestation of Attending Supervision of Advanced Practitioner (PA/CNM/NP): Evaluation and management procedures were performed by the Advanced Practitioner under my supervision and collaboration.  I have reviewed the Advanced Practitioner's note and chart, and I agree with the management and plan.  Marx Doig, MD, FACOG Attending Obstetrician & Gynecologist Faculty Practice, [facility]   

## 2014-05-20 NOTE — MAU Note (Signed)
yseat infection from [place].  Discharge, inflamed. Hurts when pees

## 2014-05-20 NOTE — Discharge Instructions (Signed)
Cutaneous Candidiasis Cutaneous candidiasis is a condition in which there is an overgrowth of yeast (candida) on the skin. Yeast normally live on the skin, but in small enough numbers not to cause any symptoms. In certain cases, increased growth of the yeast may cause an actual yeast infection. This kind of infection usually occurs in areas of the skin that are constantly warm and moist, such as the armpits or the groin. Yeast is the most common cause of diaper rash in babies and in people who cannot control their bowel movements (incontinence). CAUSES  The fungus that most often causes cutaneous candidiasis is Candida albicans. Conditions that can increase the risk of getting a yeast infection of the skin include:  Obesity.  Pregnancy.  Diabetes.  Taking antibiotic medicine.  Taking birth control pills.  Taking steroid medicines.  Thyroid disease.  An iron or zinc deficiency.  Problems with the immune system. SYMPTOMS   Red, swollen area of the skin.  Bumps on the skin.  Itchiness. DIAGNOSIS  The diagnosis of cutaneous candidiasis is usually based on its appearance. Light scrapings of the skin may also be taken and viewed under a microscope to identify the presence of yeast. TREATMENT  Antifungal creams may be applied to the infected skin. In severe cases, oral medicines may be needed.  HOME CARE INSTRUCTIONS   Keep your skin clean and dry.  Maintain a healthy weight.  If you have diabetes, keep your blood sugar under control. SEEK IMMEDIATE MEDICAL CARE IF:  Your rash continues to spread despite treatment.  You have a fever, chills, or abdominal pain. Document Released: 06/27/2011 Document Revised: 01/01/2012 Document Reviewed: 06/27/2011 ExitCare Patient Information 2015 [employer]. This information is not intended to replace advice given to you by your health care provider. Make sure you discuss any questions you have with your health care provider.  

## 2014-05-20 NOTE — MAU Provider Note (Signed)
History     CSN: [id number]  Arrival date and time: 05/20/14 1012   First Provider Initiated Contact with Patient 05/20/14 1150      Chief Complaint  Patient presents with  . Vaginal Discharge   HPI [name] is a 38 y.o. [phone] who presents to MAU today with complaint of vaginal discharge x 1 week. She states that it is thin, white, itchy and irritative. She feels that she might have a rash, but is unsure. She has had chronic vaginal dryness since her hysterectomy. She denies UTI symptoms or fever. The patient states that she was a type II diabetic who is now controlled by diet and monitored by her PCP.   OB History   Grav Para Term Preterm Abortions TAB SAB Ect Mult Living   3 3 2 1      3       Past Medical History  Diagnosis Date  . ALLERGIC RHINITIS 06/26/2008  . ANEMIA-IRON DEFICIENCY 06/26/2008  . 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  . ANKLE PAIN, LEFT 06/26/2008  . BACK PAIN, LUMBAR 10/15/2007  . BIPOLAR DISORDER UNSPECIFIED 09/13/2007  . CERVICAL RADICULOPATHY, LEFT 06/26/2008  . Cervicalgia 10/15/2007  . Chronic pain syndrome 03/21/2011  . COMMON MIGRAINE 06/26/2008  . DIABETES MELLITUS, TYPE II 06/05/2007  . DYSPNEA 12/05/2007  . EAR PAIN, RIGHT 12/05/2007  . ELEVATED BP READING WITHOUT DX HYPERTENSION 07/29/2009  . FATIGUE 12/05/2007  . FIBROMYALGIA 06/26/2008  . GASTROENTERITIS, ACUTE 12/15/2008  . GERD 06/26/2008  . Headache(784.0) 09/13/2007  . HYPERLIPIDEMIA 09/13/2007  . JOINT EFFUSION, RIGHT KNEE 07/29/2009  . KNEE PAIN, LEFT 11/21/2010  . LUMBAR RADICULOPATHY, LEFT 06/26/2008  . OTHER DISEASE OF PHARYNX OR NASOPHARYNX 07/03/2008  . PERIPHERAL EDEMA 01/06/2009  . POLYARTHRITIS 11/21/2010  . SHOULDER PAIN, BILATERAL 07/03/2008  . SINUSITIS- ACUTE-NOS 07/29/2009  . TACHYCARDIA 12/05/2007  . THYROID NODULE, RIGHT 11/20/2008  . TREMOR 01/02/2008  . ADHD (attention deficit hyperactivity disorder)     Past Surgical History  Procedure Laterality Date  . Abdominal hysterectomy       fibroids  . Thyroid fine needle aspiration  May 2008    showed non neoplastic goiter  . Back surgury      lumbar 2001  . Cesarean section      x 3    Family History  Problem Relation Age of Onset  . Thyroid disease Mother   . Diabetes Mother   . Asthma Mother   . Hypertension Father   . Hyperlipidemia Father   . Diabetes Father   . Emphysema Other   . Coronary artery disease Other   . Cancer Other     pancreatic and breast cancer  . Cancer Other     lung and prostate cancer    History  Substance Use Topics  . Smoking status: Never Smoker   . Smokeless tobacco: Not on file  . Alcohol Use: No    Allergies:  Allergies  Allergen Reactions  . Sulfa Antibiotics Anaphylaxis  . Contrast Media [Iodinated Diagnostic Agents] Itching    Mri, severe heaving ~ can take benadryl without reaction  . Promethazine Hcl     Iv dose with benadryl causes severe aggrivation  . Tdap [Diphth-Acell Pertussis-Tetanus] Hives, Itching, Swelling, Anxiety and Rash    No prescriptions prior to admission    Review of Systems  Constitutional: Negative for fever and malaise/fatigue.  Gastrointestinal: Negative for abdominal pain.  Genitourinary:       + vaginal discharge Neg - vaginal bleeding  Physical Exam   Blood pressure 126/92, pulse 97, temperature 98.4 F (36.9 C), temperature source Oral, resp. rate 16, weight 206 lb 6.4 oz (93.622 kg), SpO2 99.00%.  Physical Exam  Constitutional: She is oriented to person, place, and time. She appears well-developed and well-nourished. No distress.  HENT:  Head: Normocephalic.  Cardiovascular: Normal rate.   Respiratory: Effort normal.  Neurological: She is alert and oriented to person, place, and time.  Skin: Skin is warm and dry. No erythema.  Psychiatric: She has a normal mood and affect.   Results for orders placed during the hospital encounter of 05/20/14 (from the past 24 hour(s))  WET PREP, GENITAL     Status: Abnormal    Collection Time    05/20/14 12:06 PM      Result Value Ref Range   Yeast Wet Prep HPF POC NONE SEEN  NONE SEEN   Trich, Wet Prep NONE SEEN  NONE SEEN   Clue Cells Wet Prep HPF POC NONE SEEN  NONE SEEN   WBC, Wet Prep HPF POC FEW (*) NONE SEEN    MAU Course  Procedures None  MDM Wet prep and GC/Chlamydia today  Assessment and Plan  A: Cutaneous yeast of the vulva  P: Discharge home Rx for Nystatin and Triamcinolone given to patient for external use only Contact information for [facility] given. Patient advised to call to establish GYN care Patient may return to MAU as needed or if her condition were to change or worsen   Farris Has, PA-C  05/20/2014, 3:50 PM

## 2014-05-21 LAB — GC/CHLAMYDIA PROBE AMP
CT Probe RNA: NEGATIVE
GC Probe RNA: NEGATIVE

## 2014-06-02 ENCOUNTER — Telehealth: Payer: Self-pay | Admitting: Internal Medicine

## 2014-06-02 NOTE — Telephone Encounter (Signed)
Left message for patient to call back to schedule cpe.  Last cpe was 06/08/2011

## 2014-07-19 ENCOUNTER — Inpatient Hospital Stay (HOSPITAL_COMMUNITY)
Admission: AD | Admit: 2014-07-19 | Discharge: 2014-07-19 | Disposition: A | Discharge status: Home / Self Care | Payer: BC Managed Care – PPO | Source: Ambulatory Visit | Attending: Obstetrics and Gynecology | Admitting: Obstetrics and Gynecology

## 2014-07-19 ENCOUNTER — Encounter (HOSPITAL_COMMUNITY): Payer: Self-pay

## 2014-07-19 DIAGNOSIS — B3731 Acute candidiasis of vulva and vagina: Secondary | ICD-10-CM | POA: Insufficient documentation

## 2014-07-19 DIAGNOSIS — L293 Anogenital pruritus, unspecified: Secondary | ICD-10-CM | POA: Diagnosis present

## 2014-07-19 DIAGNOSIS — B373 Candidiasis of vulva and vagina: Secondary | ICD-10-CM | POA: Diagnosis not present

## 2014-07-19 DIAGNOSIS — E119 Type 2 diabetes mellitus without complications: Secondary | ICD-10-CM | POA: Insufficient documentation

## 2014-07-19 LAB — WET PREP, GENITAL
Clue Cells Wet Prep HPF POC: NONE SEEN
Trich, Wet Prep: NONE SEEN
Yeast Wet Prep HPF POC: NONE SEEN

## 2014-07-19 LAB — HIV ANTIBODY (ROUTINE TESTING W REFLEX): HIV 1&2 Ab, 4th Generation: NONREACTIVE

## 2014-07-19 LAB — URINALYSIS, ROUTINE W REFLEX MICROSCOPIC
Bilirubin Urine: NEGATIVE
Glucose, UA: >1000 mg/dL — AB
Hgb urine dipstick: NEGATIVE
Ketones, ur: NEGATIVE mg/dL
Leukocytes, UA: NEGATIVE
Nitrite: NEGATIVE
Protein, ur: NEGATIVE mg/dL
Specific Gravity, Urine: 1.015 (ref 1.005–1.030)
Urobilinogen, UA: 0.2 mg/dL (ref 0.0–1.0)
pH: 6 (ref 5.0–8.0)

## 2014-07-19 LAB — URINE MICROSCOPIC-ADD ON

## 2014-07-19 MED ORDER — FLUCONAZOLE 150 MG PO TABS: ORAL_TABLET | ORAL | Status: DC

## 2014-07-19 NOTE — Discharge Instructions (Signed)

## 2014-07-19 NOTE — MAU Note (Signed)
Pt prevents to MAU with complaints of vaginal itching that started about two weeks ago. Reports intercourse three weeks ago with a new partner.

## 2014-07-19 NOTE — MAU Provider Note (Signed)
History     CSN: [id number]  Arrival date and time: 07/19/14 3825   First Provider Initiated Contact with Patient 07/19/14 0930      Chief Complaint  Patient presents with  . Vaginal Itching   HPI [name] is a 38 y.o. [phone] who presents to MAU today with complaint of vaginal discomfort. She states that ~ 3 weeks ago she had sexual intercourse for the first time in > 7 months. She did not use protection. She states that sex was "rough" and now "inside doesn't feel right." She also endorses vaginal itching and swelling. She states occasional pink spotting noted, but denies vaginal discharge. She denies abdominal pain, fever or UTI symptoms.   OB History   Grav Para Term Preterm Abortions TAB SAB Ect Mult Living   3 3 2 1      3       Past Medical History  Diagnosis Date  . ALLERGIC RHINITIS 06/26/2008  . ANEMIA-IRON DEFICIENCY 06/26/2008  . 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  . ANKLE PAIN, LEFT 06/26/2008  . BACK PAIN, LUMBAR 10/15/2007  . BIPOLAR DISORDER UNSPECIFIED 09/13/2007  . CERVICAL RADICULOPATHY, LEFT 06/26/2008  . Cervicalgia 10/15/2007  . Chronic pain syndrome 03/21/2011  . COMMON MIGRAINE 06/26/2008  . DIABETES MELLITUS, TYPE II 06/05/2007  . DYSPNEA 12/05/2007  . EAR PAIN, RIGHT 12/05/2007  . ELEVATED BP READING WITHOUT DX HYPERTENSION 07/29/2009  . FATIGUE 12/05/2007  . FIBROMYALGIA 06/26/2008  . GASTROENTERITIS, ACUTE 12/15/2008  . GERD 06/26/2008  . Headache(784.0) 09/13/2007  . HYPERLIPIDEMIA 09/13/2007  . JOINT EFFUSION, RIGHT KNEE 07/29/2009  . KNEE PAIN, LEFT 11/21/2010  . LUMBAR RADICULOPATHY, LEFT 06/26/2008  . OTHER DISEASE OF PHARYNX OR NASOPHARYNX 07/03/2008  . PERIPHERAL EDEMA 01/06/2009  . POLYARTHRITIS 11/21/2010  . SHOULDER PAIN, BILATERAL 07/03/2008  . SINUSITIS- ACUTE-NOS 07/29/2009  . TACHYCARDIA 12/05/2007  . THYROID NODULE, RIGHT 11/20/2008  . TREMOR 01/02/2008  . ADHD (attention deficit hyperactivity disorder)     Past Surgical History  Procedure  Laterality Date  . Abdominal hysterectomy      fibroids  . Thyroid fine needle aspiration  May 2008    showed non neoplastic goiter  . Back surgury      lumbar 2001  . Cesarean section      x 3    Family History  Problem Relation Age of Onset  . Thyroid disease Mother   . Diabetes Mother   . Asthma Mother   . Hypertension Father   . Hyperlipidemia Father   . Diabetes Father   . Emphysema Other   . Coronary artery disease Other   . Cancer Other     pancreatic and breast cancer  . Cancer Other     lung and prostate cancer    History  Substance Use Topics  . Smoking status: Never Smoker   . Smokeless tobacco: Never Used  . Alcohol Use: No    Allergies:  Allergies  Allergen Reactions  . Sulfa Antibiotics Anaphylaxis  . Contrast Media [Iodinated Diagnostic Agents] Itching    Mri, severe heaving ~ can take benadryl without reaction  . Promethazine Hcl     Iv dose with benadryl causes severe aggrivation  . Tdap [Diphth-Acell Pertussis-Tetanus] Hives, Itching, Swelling, Anxiety and Rash    No prescriptions prior to admission    Review of Systems  Constitutional: Negative for fever and malaise/fatigue.  Gastrointestinal: Negative for abdominal pain.  Genitourinary: Negative for dysuria, urgency and frequency.       Neg -  vaginal discharge + vaginal bleeding   Physical Exam   Blood pressure 142/96, pulse 96, temperature 97.8 F (36.6 C), temperature source Oral, resp. rate 16, height 5\' 3"  (1.6 m), weight 201 lb (91.173 kg).  Physical Exam  Constitutional: She is oriented to person, place, and time. She appears well-developed and well-nourished. No distress.  HENT:  Head: Normocephalic.  Cardiovascular: Normal rate.   Respiratory: Effort normal.  Genitourinary: There is rash (mild erythema and edema bilaterally) on the right labia. There is no tenderness or lesion on the right labia. There is rash on the left labia. There is no tenderness or lesion on the left  labia. No bleeding around the vagina. Vaginal discharge (copious amount of thick, clumpy, white discharge noted) found.  No tenderness to palpation on bimanual exam. Uterus, surgically absent  Neurological: She is alert and oriented to person, place, and time.  Skin: Skin is warm and dry. No erythema.  Psychiatric: She has a normal mood and affect.   Results for orders placed during the hospital encounter of 07/19/14 (from the past 24 hour(s))  URINALYSIS, ROUTINE W REFLEX MICROSCOPIC     Status: Abnormal   Collection Time    07/19/14  9:10 AM      Result Value Ref Range   Color, Urine YELLOW  YELLOW   APPearance CLEAR  CLEAR   Specific Gravity, Urine 1.015  1.005 - 1.030   pH 6.0  5.0 - 8.0   Glucose, UA >1000 (*) NEGATIVE mg/dL   Hgb urine dipstick NEGATIVE  NEGATIVE   Bilirubin Urine NEGATIVE  NEGATIVE   Ketones, ur NEGATIVE  NEGATIVE mg/dL   Protein, ur NEGATIVE  NEGATIVE mg/dL   Urobilinogen, UA 0.2  0.0 - 1.0 mg/dL   Nitrite NEGATIVE  NEGATIVE   Leukocytes, UA NEGATIVE  NEGATIVE  URINE MICROSCOPIC-ADD ON     Status: Abnormal   Collection Time    07/19/14  9:10 AM      Result Value Ref Range   Squamous Epithelial / LPF FEW (*) RARE   WBC, UA 0-2  <3 WBC/hpf   Urine-Other MUCOUS PRESENT    WET PREP, GENITAL     Status: Abnormal   Collection Time    07/19/14  9:40 AM      Result Value Ref Range   Yeast Wet Prep HPF POC NONE SEEN  NONE SEEN   Trich, Wet Prep NONE SEEN  NONE SEEN   Clue Cells Wet Prep HPF POC NONE SEEN  NONE SEEN   WBC, Wet Prep HPF POC FEW (*) NONE SEEN    MAU Course  Procedures None  MDM Wet prep, GC/Chlamydia and HIV today  Assessment and Plan  A: DM II - diet controlled Yeast vulvovaginitis, clinical  P: Discharge home Rx for Diflucan given to patient Patient advised to continue use of topical creams given at previous visit Patient encouraged to establish care with [facility] for routine GYN care Patient encouraged to follow-up with PCP for DM  management Patient may return to MAU as needed or if her condition were to change or worsen   Luvenia Redden, PA-C  07/19/2014, 10:19 AM

## 2014-07-19 NOTE — MAU Note (Signed)
New partner, did not use any protection. Feels like something is inside vagina.

## 2014-07-19 NOTE — MAU Provider Note (Signed)
Attestation of Attending Supervision of Advanced Practitioner: Evaluation and management procedures were performed by the PA/NP/CNM/OB Fellow under my supervision/collaboration. Chart reviewed and agree with management and plan.  Chanel Mckesson V 07/19/2014 4:46 PM

## 2014-07-20 LAB — GC/CHLAMYDIA PROBE AMP
CT Probe RNA: NEGATIVE
GC Probe RNA: NEGATIVE

## 2014-08-24 ENCOUNTER — Encounter (HOSPITAL_COMMUNITY): Payer: Self-pay

## 2014-09-21 ENCOUNTER — Emergency Department (HOSPITAL_BASED_OUTPATIENT_CLINIC_OR_DEPARTMENT_OTHER)
Admission: EM | Admit: 2014-09-21 | Discharge: 2014-09-21 | Disposition: A | Discharge status: Home / Self Care | Payer: BC Managed Care – PPO | Attending: Emergency Medicine | Admitting: Emergency Medicine

## 2014-09-21 DIAGNOSIS — Z8739 Personal history of other diseases of the musculoskeletal system and connective tissue: Secondary | ICD-10-CM | POA: Insufficient documentation

## 2014-09-21 DIAGNOSIS — Z79899 Other long term (current) drug therapy: Secondary | ICD-10-CM | POA: Insufficient documentation

## 2014-09-21 DIAGNOSIS — H9203 Otalgia, bilateral: Secondary | ICD-10-CM | POA: Diagnosis present

## 2014-09-21 DIAGNOSIS — E119 Type 2 diabetes mellitus without complications: Secondary | ICD-10-CM | POA: Insufficient documentation

## 2014-09-21 DIAGNOSIS — Z8659 Personal history of other mental and behavioral disorders: Secondary | ICD-10-CM | POA: Diagnosis not present

## 2014-09-21 DIAGNOSIS — Z8719 Personal history of other diseases of the digestive system: Secondary | ICD-10-CM | POA: Diagnosis not present

## 2014-09-21 DIAGNOSIS — H6122 Impacted cerumen, left ear: Secondary | ICD-10-CM | POA: Insufficient documentation

## 2014-09-21 DIAGNOSIS — G894 Chronic pain syndrome: Secondary | ICD-10-CM | POA: Insufficient documentation

## 2014-09-21 DIAGNOSIS — J020 Streptococcal pharyngitis: Secondary | ICD-10-CM | POA: Insufficient documentation

## 2014-09-21 DIAGNOSIS — M791 Myalgia: Secondary | ICD-10-CM | POA: Diagnosis not present

## 2014-09-21 DIAGNOSIS — Z862 Personal history of diseases of the blood and blood-forming organs and certain disorders involving the immune mechanism: Secondary | ICD-10-CM | POA: Insufficient documentation

## 2014-09-21 LAB — RAPID STREP SCREEN (MED CTR MEBANE ONLY): Streptococcus, Group A Screen (Direct): POSITIVE — AB

## 2014-09-21 MED ORDER — PENICILLIN G BENZATHINE 1200000 UNIT/2ML IM SUSP
1.2000 10*6.[IU] | Freq: Once | INTRAMUSCULAR | Status: AC
  Administered 2014-09-21: 1.2 10*6.[IU] via INTRAMUSCULAR

## 2014-09-21 MED ORDER — PENICILLIN G BENZATHINE 1200000 UNIT/2ML IM SUSP
INTRAMUSCULAR | Status: AC
  Filled 2014-09-21: qty 2

## 2014-09-21 NOTE — ED Notes (Signed)
Pt reports generalized body aches and (R) ear pain x 1 day.

## 2014-09-21 NOTE — Discharge Instructions (Signed)
Cerumen Impaction °A cerumen impaction is when the wax in your ear forms a plug. This plug usually causes reduced hearing. Sometimes it also causes an earache or dizziness. Removing a cerumen impaction can be difficult and painful. The wax sticks to the ear canal. The canal is sensitive and bleeds easily. If you try to remove a heavy wax buildup with a cotton tipped swab, you may push it in further. °Irrigation with water, suction, and small ear curettes may be used to clear out the wax. If the impaction is fixed to the skin in the ear canal, ear drops may be needed for a few days to loosen the wax. People who build up a lot of wax frequently can use ear wax removal products available in your local drugstore. °SEEK MEDICAL CARE IF:  °You develop an earache, increased hearing loss, or marked dizziness. °Document Released: 11/16/2004 Document Revised: 01/01/2012 Document Reviewed: 01/06/2010 °ExitCare® Patient Information ©2015 [employer]. This information is not intended to replace advice given to you by your health care provider. Make sure you discuss any questions you have with your health care provider. ° °

## 2014-09-21 NOTE — ED Notes (Signed)
NP at bedside.

## 2014-09-21 NOTE — ED Provider Notes (Addendum)
CSN: [id number]     Arrival date & time 09/21/14  1234 History   First MD Initiated Contact with Patient 09/21/14 1249     Chief Complaint  Patient presents with  . Otalgia     (Consider location/radiation/quality/duration/timing/severity/associated sxs/prior Treatment) HPI Comments: Pt comes in with complaints of bilateral ear pain and myalgias times 1 days. Possible low grade temperature. Denies cough and congestion. Hasn't taken anything for the symptoms. She states that the left one feels closed. No previous history of ear problems  The history is provided by the patient. No language interpreter was used.    Past Medical History  Diagnosis Date  . ALLERGIC RHINITIS 06/26/2008  . ANEMIA-IRON DEFICIENCY 06/26/2008  . 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  . ANKLE PAIN, LEFT 06/26/2008  . BACK PAIN, LUMBAR 10/15/2007  . BIPOLAR DISORDER UNSPECIFIED 09/13/2007  . CERVICAL RADICULOPATHY, LEFT 06/26/2008  . Cervicalgia 10/15/2007  . Chronic pain syndrome 03/21/2011  . COMMON MIGRAINE 06/26/2008  . DIABETES MELLITUS, TYPE II 06/05/2007  . DYSPNEA 12/05/2007  . EAR PAIN, RIGHT 12/05/2007  . ELEVATED BP READING WITHOUT DX HYPERTENSION 07/29/2009  . FATIGUE 12/05/2007  . FIBROMYALGIA 06/26/2008  . GASTROENTERITIS, ACUTE 12/15/2008  . GERD 06/26/2008  . Headache(784.0) 09/13/2007  . HYPERLIPIDEMIA 09/13/2007  . JOINT EFFUSION, RIGHT KNEE 07/29/2009  . KNEE PAIN, LEFT 11/21/2010  . LUMBAR RADICULOPATHY, LEFT 06/26/2008  . OTHER DISEASE OF PHARYNX OR NASOPHARYNX 07/03/2008  . PERIPHERAL EDEMA 01/06/2009  . POLYARTHRITIS 11/21/2010  . SHOULDER PAIN, BILATERAL 07/03/2008  . SINUSITIS- ACUTE-NOS 07/29/2009  . TACHYCARDIA 12/05/2007  . THYROID NODULE, RIGHT 11/20/2008  . TREMOR 01/02/2008  . ADHD (attention deficit hyperactivity disorder)    Past Surgical History  Procedure Laterality Date  . Abdominal hysterectomy      fibroids  . Thyroid fine needle aspiration  May 2008    showed non neoplastic goiter  . Back surgury       lumbar 2001  . Cesarean section      x 3   Family History  Problem Relation Age of Onset  . Thyroid disease Mother   . Diabetes Mother   . Asthma Mother   . Hypertension Father   . Hyperlipidemia Father   . Diabetes Father   . Emphysema Other   . Coronary artery disease Other   . Cancer Other     pancreatic and breast cancer  . Cancer Other     lung and prostate cancer   History  Substance Use Topics  . Smoking status: Never Smoker   . Smokeless tobacco: Never Used  . Alcohol Use: No   OB History    Gravida Para Term Preterm AB TAB SAB Ectopic Multiple Living   3 3 2 1      3      Review of Systems  All other systems reviewed and are negative.     Allergies  Sulfa antibiotics; Contrast media; Promethazine hcl; and Tdap  Home Medications   Prior to Admission medications   Medication Sig Start Date End Date Taking? Authorizing Provider  fluconazole (DIFLUCAN) 150 MG tablet Take 1 tab (150 mg) on day 1,4,7 07/19/14   Luvenia Redden, PA-C   BP 122/76 mmHg  Pulse 104  Temp(Src) 99.7 F (37.6 C) (Oral)  Resp 18  Ht 5\' 5"  (1.651 m)  Wt 210 lb (95.255 kg)  BMI 34.95 kg/m2 Physical Exam  Constitutional: She is oriented to person, place, and time. She appears well-developed and well-nourished.  HENT:  Right Ear:  External ear normal.  Nose: Rhinorrhea present.  Mouth/Throat: Posterior oropharyngeal erythema present.  Left cerumen impaction  Eyes: Conjunctivae are normal. Pupils are equal, round, and reactive to light.  Cardiovascular: Normal rate and regular rhythm.   Pulmonary/Chest: Effort normal and breath sounds normal.  Abdominal: Bowel sounds are normal.  Musculoskeletal: Normal range of motion.  Neurological: She is alert and oriented to person, place, and time.  Skin: Skin is warm and dry.  Psychiatric: She has a normal mood and affect.  Nursing note and vitals reviewed.   ED Course  Procedures (including critical care time) Labs Review Labs  Reviewed - No data to display  Imaging Review No results found.   EKG Interpretation None      MDM   Final diagnoses:  Cerumen impaction, left   Left tm is clear after nurse irrigated the area. No infection noted. Discussed likely viral in nature. Discussed symptomatic relief with pt  Glendell Docker, NP 09/21/14 [place], MD 09/21/14 [place], NP 09/21/14 [place], MD 09/21/14 1432

## 2014-10-15 ENCOUNTER — Encounter: Payer: BC Managed Care – PPO | Admitting: Internal Medicine

## 2015-01-28 ENCOUNTER — Ambulatory Visit: Payer: BC Managed Care – PPO | Admitting: Internal Medicine

## 2015-02-17 ENCOUNTER — Encounter (HOSPITAL_BASED_OUTPATIENT_CLINIC_OR_DEPARTMENT_OTHER): Payer: Self-pay | Admitting: *Deleted

## 2015-02-17 ENCOUNTER — Emergency Department (HOSPITAL_BASED_OUTPATIENT_CLINIC_OR_DEPARTMENT_OTHER)
Admission: EM | Admit: 2015-02-17 | Discharge: 2015-02-17 | Disposition: A | Discharge status: Home / Self Care | Payer: BLUE CROSS/BLUE SHIELD | Attending: Emergency Medicine | Admitting: Emergency Medicine

## 2015-02-17 DIAGNOSIS — Z862 Personal history of diseases of the blood and blood-forming organs and certain disorders involving the immune mechanism: Secondary | ICD-10-CM | POA: Insufficient documentation

## 2015-02-17 DIAGNOSIS — R42 Dizziness and giddiness: Secondary | ICD-10-CM | POA: Diagnosis not present

## 2015-02-17 DIAGNOSIS — G894 Chronic pain syndrome: Secondary | ICD-10-CM | POA: Insufficient documentation

## 2015-02-17 DIAGNOSIS — Z8739 Personal history of other diseases of the musculoskeletal system and connective tissue: Secondary | ICD-10-CM | POA: Insufficient documentation

## 2015-02-17 DIAGNOSIS — Z8719 Personal history of other diseases of the digestive system: Secondary | ICD-10-CM | POA: Insufficient documentation

## 2015-02-17 DIAGNOSIS — H748X9 Other specified disorders of middle ear and mastoid, unspecified ear: Secondary | ICD-10-CM | POA: Insufficient documentation

## 2015-02-17 DIAGNOSIS — R110 Nausea: Secondary | ICD-10-CM | POA: Insufficient documentation

## 2015-02-17 DIAGNOSIS — Z8709 Personal history of other diseases of the respiratory system: Secondary | ICD-10-CM | POA: Diagnosis not present

## 2015-02-17 DIAGNOSIS — Z8659 Personal history of other mental and behavioral disorders: Secondary | ICD-10-CM | POA: Diagnosis not present

## 2015-02-17 DIAGNOSIS — R51 Headache: Secondary | ICD-10-CM | POA: Insufficient documentation

## 2015-02-17 DIAGNOSIS — E119 Type 2 diabetes mellitus without complications: Secondary | ICD-10-CM | POA: Diagnosis not present

## 2015-02-17 DIAGNOSIS — H8110 Benign paroxysmal vertigo, unspecified ear: Secondary | ICD-10-CM

## 2015-02-17 HISTORY — DX: Dizziness and giddiness: R42

## 2015-02-17 MED ORDER — MECLIZINE HCL 25 MG PO TABS: 25.0000 mg | ORAL_TABLET | Freq: Four times a day (QID) | ORAL | Status: DC | PRN

## 2015-02-17 NOTE — ED Notes (Signed)
Pt c/o dizziness x 5 days

## 2015-02-17 NOTE — ED Provider Notes (Signed)
CSN: [id number]     Arrival date & time 02/17/15  1839 History   This chart was scribed for Shirley Dessert, MD by Shirley [name], ED Scribe. This patient was seen in room MH02/MH02 and the patient's care was started 6:58 PM.     Chief Complaint  Patient presents with  . Dizziness    The history is provided by the patient. No language interpreter was used.    HPI Comments:  Shirley [name] is a 39 y.o. female with a history of positional vertigo who presents to the Emergency Department complaining of intermittent room spinning dizziness that started 5 days ago. She reports associated mild nausea and mild HA. She notes the dizziness is exacerbated by certain movements and her episodes of dizziness are worse in the AM. She denies fever, and recent cold symptoms. No numbness or weakness.  She has taken dramamine with improvement of her symptoms.   Past Medical History  Diagnosis Date  . ALLERGIC RHINITIS 06/26/2008  . ANEMIA-IRON DEFICIENCY 06/26/2008  . 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  . ANKLE PAIN, LEFT 06/26/2008  . BACK PAIN, LUMBAR 10/15/2007  . BIPOLAR DISORDER UNSPECIFIED 09/13/2007  . CERVICAL RADICULOPATHY, LEFT 06/26/2008  . Cervicalgia 10/15/2007  . Chronic pain syndrome 03/21/2011  . COMMON MIGRAINE 06/26/2008  . DIABETES MELLITUS, TYPE II 06/05/2007  . DYSPNEA 12/05/2007  . EAR PAIN, RIGHT 12/05/2007  . ELEVATED BP READING WITHOUT DX HYPERTENSION 07/29/2009  . FATIGUE 12/05/2007  . FIBROMYALGIA 06/26/2008  . GASTROENTERITIS, ACUTE 12/15/2008  . GERD 06/26/2008  . Headache(784.0) 09/13/2007  . HYPERLIPIDEMIA 09/13/2007  . JOINT EFFUSION, RIGHT KNEE 07/29/2009  . KNEE PAIN, LEFT 11/21/2010  . LUMBAR RADICULOPATHY, LEFT 06/26/2008  . OTHER DISEASE OF PHARYNX OR NASOPHARYNX 07/03/2008  . PERIPHERAL EDEMA 01/06/2009  . POLYARTHRITIS 11/21/2010  . SHOULDER PAIN, BILATERAL 07/03/2008  . SINUSITIS- ACUTE-NOS 07/29/2009  . TACHYCARDIA 12/05/2007  . THYROID NODULE, RIGHT 11/20/2008  . TREMOR 01/02/2008  . ADHD  (attention deficit hyperactivity disorder)   . Vertigo    Past Surgical History  Procedure Laterality Date  . Abdominal hysterectomy      fibroids  . Thyroid fine needle aspiration  May 2008    showed non neoplastic goiter  . Back surgury      lumbar 2001  . Cesarean section      x 3   Family History  Problem Relation Age of Onset  . Thyroid disease Mother   . Diabetes Mother   . Asthma Mother   . Hypertension Father   . Hyperlipidemia Father   . Diabetes Father   . Emphysema Other   . Coronary artery disease Other   . Cancer Other     pancreatic and breast cancer  . Cancer Other     lung and prostate cancer   History  Substance Use Topics  . Smoking status: Never Smoker   . Smokeless tobacco: Never Used  . Alcohol Use: No   OB History    Gravida Para Term Preterm AB TAB SAB Ectopic Multiple Living   3 3 2 1      3      Review of Systems  Constitutional: Negative for fever.  HENT: Negative for congestion.   Respiratory: Negative for cough.   Gastrointestinal: Positive for nausea. Negative for vomiting.  Neurological: Positive for dizziness and headaches.  All other systems reviewed and are negative.     Allergies  Sulfa antibiotics; Contrast media; Promethazine hcl; and Tdap  Home Medications   Prior to  Admission medications   Not on File   BP 139/88 mmHg  Pulse 93  Temp(Src) 98.8 F (37.1 C)  Resp 16  Wt 210 lb (95.255 kg)  SpO2 97% Physical Exam  Constitutional: She is oriented to person, place, and time. She appears well-developed and well-nourished.  HENT:  Head: Normocephalic and atraumatic.  Right Ear: A middle ear effusion is present.  Nose: Nose normal.  Mouth/Throat: No oropharyngeal exudate, posterior oropharyngeal edema or posterior oropharyngeal erythema.  Eyes: Conjunctivae are normal. Right eye exhibits no nystagmus. Left eye exhibits no nystagmus.  No nystagmus   Cardiovascular: Normal rate.   Pulmonary/Chest: Effort normal.   Abdominal: She exhibits no distension.  Neurological: She is alert and oriented to person, place, and time. She has normal strength. No cranial nerve deficit or sensory deficit. Coordination and gait normal.  5/5 strength Normal heel to shin Reproducible dizziness with head movement  Skin: Skin is warm and dry.  Psychiatric: She has a normal mood and affect.  Nursing note and vitals reviewed.   ED Course  Procedures   DIAGNOSTIC STUDIES:  Oxygen Saturation is 97% on RA, normal by my interpretation.    COORDINATION OF CARE:  7:08 PM Will order meclizine. Discussed treatment plan with pt at bedside and pt agreed to plan.  Labs Review Labs Reviewed - No data to display  Imaging Review No results found.   EKG Interpretation None      MDM   Final diagnoses:  BPV (benign positional vertigo), unspecified laterality    Pt with sx most consistent with peripheral vertigo.  No systemic or infectious sx.  Normal neuro exam without weakness, ataxia or cerebellar findings on exam.  Normal vision.  Sx are reproducible with movement of the head and attempting to walk.  No hx of Stroke and low likelihood.  No risk factors and normal VS. Will treat for peripheral vertigo    I personally performed the services described in this documentation, which was scribed in my presence.  The recorded information has been reviewed and considered.     Shirley Dessert, MD 02/17/15 [phone]

## 2015-02-17 NOTE — Discharge Instructions (Signed)
Benign Positional Vertigo °Vertigo means you feel like you or your surroundings are moving when they are not. Benign positional vertigo is the most common form of vertigo. Benign means that the cause of your condition is not serious. Benign positional vertigo is more common in older adults. °CAUSES  °Benign positional vertigo is the result of an upset in the labyrinth system. This is an area in the middle ear that helps control your balance. This may be caused by a viral infection, head injury, or repetitive motion. However, often no specific cause is found. °SYMPTOMS  °Symptoms of benign positional vertigo occur when you move your head or eyes in different directions. Some of the symptoms may include: °1. Loss of balance and falls. °2. Vomiting. °3. Blurred vision. °4. Dizziness. °5. Nausea. °6. Involuntary eye movements (nystagmus). °DIAGNOSIS  °Benign positional vertigo is usually diagnosed by physical exam. If the specific cause of your benign positional vertigo is unknown, your caregiver may perform imaging tests, such as magnetic resonance imaging (MRI) or computed tomography (CT). °TREATMENT  °Your caregiver may recommend movements or procedures to correct the benign positional vertigo. Medicines such as meclizine, benzodiazepines, and medicines for nausea may be used to treat your symptoms. In rare cases, if your symptoms are caused by certain conditions that affect the inner ear, you may need surgery. °HOME CARE INSTRUCTIONS  °· Follow your caregiver's instructions. °· Move slowly. Do not make sudden body or head movements. °· Avoid driving. °· Avoid operating heavy machinery. °· Avoid performing any tasks that would be dangerous to you or others during a vertigo episode. °· Drink enough fluids to keep your urine clear or pale yellow. °SEEK IMMEDIATE MEDICAL CARE IF:  °· You develop problems with walking, weakness, numbness, or using your arms, hands, or legs. °· You have difficulty speaking. °· You develop  severe headaches. °· Your nausea or vomiting continues or gets worse. °· You develop visual changes. °· Your family or friends notice any behavioral changes. °· Your condition gets worse. °· You have a fever. °· You develop a stiff neck or sensitivity to light. °MAKE SURE YOU:  °· Understand these instructions. °· Will watch your condition. °· Will get help right away if you are not doing well or get worse. °Document Released: 07/17/2006 Document Revised: 01/01/2012 Document Reviewed: 06/29/2011 °ExitCare® Patient Information ©2015 [employer]. This information is not intended to replace advice given to you by your health care provider. Make sure you discuss any questions you have with your health care provider. ° °Epley Maneuver Self-Care °WHAT IS THE EPLEY MANEUVER? °The Epley maneuver is an exercise you can do to relieve symptoms of benign paroxysmal positional vertigo (BPPV). This condition is often just referred to as vertigo. BPPV is caused by the movement of tiny crystals (canaliths) inside your inner ear. The accumulation and movement of canaliths in your inner ear causes a sudden spinning sensation (vertigo) when you move your head to certain positions. Vertigo usually lasts about 30 seconds. BPPV usually occurs in just one ear. If you get vertigo when you lie on your left side, you probably have BPPV in your left ear. Your health care provider can tell you which ear is involved.  °BPPV may be caused by a head injury. Many people older than 50 get BPPV for unknown reasons. If you have been diagnosed with BPPV, your health care provider may teach you how to do this maneuver. BPPV is not life threatening (benign) and usually goes away in time.  °  WHEN SHOULD I PERFORM THE EPLEY MANEUVER? °You can do this maneuver at home whenever you have symptoms of vertigo. You may do the Epley maneuver up to 3 times a day until your symptoms of vertigo go away. °HOW SHOULD I DO THE EPLEY MANEUVER? °7. Sit on the edge of a  bed or table with your back straight. Your legs should be extended or hanging over the edge of the bed or table.   °8. Turn your head halfway toward the affected ear.   °9. Lie backward quickly with your head turned until you are lying flat on your back. You may want to position a pillow under your shoulders.   °10. Hold this position for 30 seconds. You may experience an attack of vertigo. This is normal. Hold this position until the vertigo stops. °11. Then turn your head to the opposite direction until your unaffected ear is facing the floor.   °12. Hold this position for 30 seconds. You may experience an attack of vertigo. This is normal. Hold this position until the vertigo stops. °13. Now turn your whole body to the same side as your head. Hold for another 30 seconds.   °14. You can then sit back up. °ARE THERE RISKS TO THIS MANEUVER? °In some cases, you may have other symptoms (such as changes in your vision, weakness, or numbness). If you have these symptoms, stop doing the maneuver and call your health care provider. Even if doing these maneuvers relieves your vertigo, you may still have dizziness. Dizziness is the sensation of light-headedness but without the sensation of movement. Even though the Epley maneuver may relieve your vertigo, it is possible that your symptoms will return within 5 years. °WHAT SHOULD I DO AFTER THIS MANEUVER? °After doing the Epley maneuver, you can return to your normal activities. Ask your doctor if there is anything you should do at home to prevent vertigo. This may include: °· Sleeping with two or more pillows to keep your head elevated. °· Not sleeping on the side of your affected ear. °· Getting up slowly from bed. °· Avoiding sudden movements during the day. °· Avoiding extreme head movement, like looking up or bending over. °· Wearing a cervical collar to prevent sudden head movements. °WHAT SHOULD I DO IF MY SYMPTOMS GET WORSE? °Call your health care provider if your  vertigo gets worse. Call your provider right way if you have other symptoms, including:  °· Nausea. °· Vomiting. °· Headache. °· Weakness. °· Numbness. °· Vision changes. °Document Released: 10/14/2013 Document Reviewed: 10/14/2013 °ExitCare® Patient Information ©2015 [employer]. This information is not intended to replace advice given to you by your health care provider. Make sure you discuss any questions you have with your health care provider. ° °

## 2015-03-07 ENCOUNTER — Encounter (HOSPITAL_COMMUNITY): Payer: Self-pay | Admitting: *Deleted

## 2015-03-07 ENCOUNTER — Emergency Department (INDEPENDENT_AMBULATORY_CARE_PROVIDER_SITE_OTHER)
Admission: EM | Admit: 2015-03-07 | Discharge: 2015-03-07 | Disposition: A | Discharge status: Home / Self Care | Payer: BLUE CROSS/BLUE SHIELD | Source: Home / Self Care | Attending: Family Medicine | Admitting: Family Medicine

## 2015-03-07 DIAGNOSIS — H8110 Benign paroxysmal vertigo, unspecified ear: Secondary | ICD-10-CM

## 2015-03-07 MED ORDER — IPRATROPIUM BROMIDE 0.06 % NA SOLN: 2.0000 | Freq: Four times a day (QID) | NASAL | Status: DC

## 2015-03-07 MED ORDER — NICOTINE 14 MG/24HR TD PT24: 14.0000 mg | MEDICATED_PATCH | Freq: Every day | TRANSDERMAL | Status: DC

## 2015-03-07 MED ORDER — TRIAMCINOLONE ACETONIDE 40 MG/ML IJ SUSP
INTRAMUSCULAR | Status: AC
Start: 2015-03-07 — End: 2015-03-07
  Filled 2015-03-07: qty 1

## 2015-03-07 MED ORDER — METHYLPREDNISOLONE ACETATE 40 MG/ML IJ SUSP
80.0000 mg | Freq: Once | INTRAMUSCULAR | Status: AC
  Administered 2015-03-07: 80 mg via INTRAMUSCULAR

## 2015-03-07 MED ORDER — TRIAMCINOLONE ACETONIDE 40 MG/ML IJ SUSP
40.0000 mg | Freq: Once | INTRAMUSCULAR | Status: AC
  Administered 2015-03-07: 40 mg via INTRAMUSCULAR

## 2015-03-07 MED ORDER — METHYLPREDNISOLONE ACETATE 80 MG/ML IJ SUSP
INTRAMUSCULAR | Status: AC
  Filled 2015-03-07: qty 1

## 2015-03-07 MED ORDER — SCOPOLAMINE 1 MG/3DAYS TD PT72: 1.0000 | MEDICATED_PATCH | TRANSDERMAL | Status: DC

## 2015-03-07 NOTE — ED Provider Notes (Signed)
CSN: [id number]     Arrival date & time 03/07/15  0905 History   None    Chief Complaint  Patient presents with  . Dizziness   (Consider location/radiation/quality/duration/timing/severity/associated sxs/prior Treatment) Patient is a 39 y.o. female presenting with dizziness. The history is provided by the patient.  Dizziness Quality:  Imbalance Severity:  Mild Onset quality:  Gradual Progression:  Improving Chronicity:  Recurrent Context: bending over and head movement   Context comment:  Seen 4/27 for same , has improved but not resolved, much less severe than prev. sl nausea. ok when  sits still. Relieved by:  Being still and medication Associated symptoms: nausea   Associated symptoms: no palpitations, no tinnitus, no vomiting and no weakness   Risk factors: hx of vertigo     Past Medical History  Diagnosis Date  . ALLERGIC RHINITIS 06/26/2008  . ANEMIA-IRON DEFICIENCY 06/26/2008  . 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  . ANKLE PAIN, LEFT 06/26/2008  . BACK PAIN, LUMBAR 10/15/2007  . BIPOLAR DISORDER UNSPECIFIED 09/13/2007  . CERVICAL RADICULOPATHY, LEFT 06/26/2008  . Cervicalgia 10/15/2007  . Chronic pain syndrome 03/21/2011  . COMMON MIGRAINE 06/26/2008  . DIABETES MELLITUS, TYPE II 06/05/2007  . DYSPNEA 12/05/2007  . EAR PAIN, RIGHT 12/05/2007  . ELEVATED BP READING WITHOUT DX HYPERTENSION 07/29/2009  . FATIGUE 12/05/2007  . FIBROMYALGIA 06/26/2008  . GASTROENTERITIS, ACUTE 12/15/2008  . GERD 06/26/2008  . Headache(784.0) 09/13/2007  . HYPERLIPIDEMIA 09/13/2007  . JOINT EFFUSION, RIGHT KNEE 07/29/2009  . KNEE PAIN, LEFT 11/21/2010  . LUMBAR RADICULOPATHY, LEFT 06/26/2008  . OTHER DISEASE OF PHARYNX OR NASOPHARYNX 07/03/2008  . PERIPHERAL EDEMA 01/06/2009  . POLYARTHRITIS 11/21/2010  . SHOULDER PAIN, BILATERAL 07/03/2008  . SINUSITIS- ACUTE-NOS 07/29/2009  . TACHYCARDIA 12/05/2007  . THYROID NODULE, RIGHT 11/20/2008  . TREMOR 01/02/2008  . ADHD (attention deficit hyperactivity disorder)   . Vertigo     Past Surgical History  Procedure Laterality Date  . Abdominal hysterectomy      fibroids  . Thyroid fine needle aspiration  May 2008    showed non neoplastic goiter  . Back surgury      lumbar 2001  . Cesarean section      x 3   Family History  Problem Relation Age of Onset  . Thyroid disease Mother   . Diabetes Mother   . Asthma Mother   . Hypertension Father   . Hyperlipidemia Father   . Diabetes Father   . Emphysema Other   . Coronary artery disease Other   . Cancer Other     pancreatic and breast cancer  . Cancer Other     lung and prostate cancer   History  Substance Use Topics  . Smoking status: Never Smoker   . Smokeless tobacco: Never Used  . Alcohol Use: No   OB History    Gravida Para Term Preterm AB TAB SAB Ectopic Multiple Living   3 3 2 1      3      Review of Systems  Constitutional: Negative.   HENT: Negative.  Negative for tinnitus.   Cardiovascular: Negative for palpitations.  Gastrointestinal: Positive for nausea. Negative for vomiting.  Neurological: Positive for dizziness and light-headedness. Negative for weakness.    Allergies  Sulfa antibiotics; Contrast media; Promethazine hcl; and Tdap  Home Medications   Prior to Admission medications   Medication Sig Start Date End Date Taking? Authorizing Provider  ipratropium (ATROVENT) 0.06 % nasal spray Place 2 sprays into both nostrils 4 (four) times  daily. 03/07/15   Billy Fischer, MD  meclizine (ANTIVERT) 25 MG tablet Take 1 tablet (25 mg total) by mouth 4 (four) times daily as needed for dizziness. 02/17/15   [name], MD  nicotine ([facility] NICOTINE TRANSDERMAL SYS) 14 mg/24hr patch Place 1 patch (14 mg total) onto the skin daily. Change every 4 days. 03/07/15   Billy Fischer, MD   BP 120/83 mmHg  Pulse 103  Temp(Src) 98.1 F (36.7 C) (Oral)  Resp 16  SpO2 98% Physical Exam  Constitutional: She is oriented to person, place, and time. She appears well-developed and well-nourished. No  distress.  HENT:  Head: Normocephalic.  Right Ear: External ear normal.  Left Ear: External ear normal.  Mouth/Throat: Oropharynx is clear and moist.  Eyes: Conjunctivae and EOM are normal. Pupils are equal, round, and reactive to light.  Neck: Normal range of motion. Neck supple.  Cardiovascular: Normal rate, regular rhythm and normal heart sounds.   Pulmonary/Chest: Effort normal and breath sounds normal.  Lymphadenopathy:    She has no cervical adenopathy.  Neurological: She is alert and oriented to person, place, and time. No cranial nerve deficit. Coordination normal.  Skin: Skin is warm and dry.  Nursing note and vitals reviewed.   ED Course  Procedures (including critical care time) Labs Review Labs Reviewed - No data to display  Imaging Review No results found.   MDM   1. BPV (benign positional vertigo), unspecified laterality       Billy Fischer, MD 03/07/15 1013

## 2015-03-07 NOTE — Discharge Instructions (Signed)
Drink plenty of water, use medicine as prescribed, see your doctor if further problems.

## 2015-03-07 NOTE — ED Notes (Signed)
Pt    Reports  Symptoms  Of  Vertigo     And  Dizzy         With     Symptoms          X  3  Week      [facility]            Was  rx  Meclizine   And told  To  Use  A  Decongestant        she  Reports  Got  Better at  Intervals    And   Continues      To      Have  Symptoms

## 2015-04-19 ENCOUNTER — Other Ambulatory Visit: Payer: Self-pay

## 2015-05-05 ENCOUNTER — Ambulatory Visit: Payer: BLUE CROSS/BLUE SHIELD | Admitting: Family

## 2015-08-23 ENCOUNTER — Encounter (HOSPITAL_BASED_OUTPATIENT_CLINIC_OR_DEPARTMENT_OTHER): Payer: Self-pay

## 2015-08-23 ENCOUNTER — Emergency Department (HOSPITAL_BASED_OUTPATIENT_CLINIC_OR_DEPARTMENT_OTHER)
Admission: EM | Admit: 2015-08-23 | Discharge: 2015-08-23 | Disposition: A | Discharge status: Home / Self Care | Payer: BLUE CROSS/BLUE SHIELD | Attending: Emergency Medicine | Admitting: Emergency Medicine

## 2015-08-23 DIAGNOSIS — G894 Chronic pain syndrome: Secondary | ICD-10-CM | POA: Diagnosis not present

## 2015-08-23 DIAGNOSIS — Z8719 Personal history of other diseases of the digestive system: Secondary | ICD-10-CM | POA: Diagnosis not present

## 2015-08-23 DIAGNOSIS — R000 Tachycardia, unspecified: Secondary | ICD-10-CM | POA: Insufficient documentation

## 2015-08-23 DIAGNOSIS — Z8659 Personal history of other mental and behavioral disorders: Secondary | ICD-10-CM | POA: Diagnosis not present

## 2015-08-23 DIAGNOSIS — Z862 Personal history of diseases of the blood and blood-forming organs and certain disorders involving the immune mechanism: Secondary | ICD-10-CM | POA: Diagnosis not present

## 2015-08-23 DIAGNOSIS — Z8679 Personal history of other diseases of the circulatory system: Secondary | ICD-10-CM | POA: Insufficient documentation

## 2015-08-23 DIAGNOSIS — E119 Type 2 diabetes mellitus without complications: Secondary | ICD-10-CM | POA: Insufficient documentation

## 2015-08-23 DIAGNOSIS — Z8709 Personal history of other diseases of the respiratory system: Secondary | ICD-10-CM | POA: Diagnosis not present

## 2015-08-23 DIAGNOSIS — Z8739 Personal history of other diseases of the musculoskeletal system and connective tissue: Secondary | ICD-10-CM | POA: Diagnosis not present

## 2015-08-23 DIAGNOSIS — R350 Frequency of micturition: Secondary | ICD-10-CM | POA: Diagnosis present

## 2015-08-23 DIAGNOSIS — N390 Urinary tract infection, site not specified: Secondary | ICD-10-CM | POA: Diagnosis not present

## 2015-08-23 LAB — URINALYSIS, ROUTINE W REFLEX MICROSCOPIC
Bilirubin Urine: NEGATIVE
Glucose, UA: >1000 mg/dL — AB
Ketones, ur: 15 mg/dL — AB
Nitrite: POSITIVE — AB
Protein, ur: 100 mg/dL — AB
Specific Gravity, Urine: 1.02 (ref 1.005–1.030)
Urobilinogen, UA: 0.2 mg/dL (ref 0.0–1.0)
pH: 5.5 (ref 5.0–8.0)

## 2015-08-23 LAB — URINE MICROSCOPIC-ADD ON

## 2015-08-23 MED ORDER — CEPHALEXIN 250 MG PO CAPS
500.0000 mg | ORAL_CAPSULE | Freq: Once | ORAL | Status: AC
  Administered 2015-08-23: 500 mg via ORAL
  Filled 2015-08-23: qty 2

## 2015-08-23 MED ORDER — PROMETHAZINE HCL 25 MG PO TABS: 25.0000 mg | ORAL_TABLET | Freq: Four times a day (QID) | ORAL | Status: DC | PRN

## 2015-08-23 MED ORDER — CEPHALEXIN 500 MG PO CAPS: 500.0000 mg | ORAL_CAPSULE | Freq: Two times a day (BID) | ORAL | Status: DC

## 2015-08-23 NOTE — ED Notes (Signed)
Pt reports unable to [place] kit for ED WR

## 2015-08-23 NOTE — Discharge Instructions (Signed)

## 2015-08-23 NOTE — ED Provider Notes (Signed)
CSN: [id number]     Arrival date & time 08/23/15  1404 History  By signing my name below, I, Eustaquio Maize, attest that this documentation has been prepared under the direction and in the presence of Quintella Reichert, MD. Electronically Signed: Eustaquio Maize, ED Scribe. 08/23/2015. 3:11 PM.  Chief Complaint  Patient presents with  . Urinary Frequency   The history is provided by the patient. No language interpreter was used.     HPI Comments: Shirley [name] is a 39 y.o. female with hx DM who presents to the Emergency Department complaining of urinary frequency and dysuria x 1 week. Pt states she began having periumbilical pain today as well. Denies fever, chills, nausea, vomiting, diarrhea, or any other associated symptoms. Pt does not take any medicine for her diabetes. She was told by her PCP to just regulate it with diet. Pt states she does not usually check her glucose level and has been eating sugars more than usual lately. No risk of pregnancy (partial hysterectomy)   Past Medical History  Diagnosis Date  . ALLERGIC RHINITIS 06/26/2008  . ANEMIA-IRON DEFICIENCY 06/26/2008  . 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  . ANKLE PAIN, LEFT 06/26/2008  . BACK PAIN, LUMBAR 10/15/2007  . BIPOLAR DISORDER UNSPECIFIED 09/13/2007  . CERVICAL RADICULOPATHY, LEFT 06/26/2008  . Cervicalgia 10/15/2007  . Chronic pain syndrome 03/21/2011  . COMMON MIGRAINE 06/26/2008  . DIABETES MELLITUS, TYPE II 06/05/2007  . DYSPNEA 12/05/2007  . EAR PAIN, RIGHT 12/05/2007  . ELEVATED BP READING WITHOUT DX HYPERTENSION 07/29/2009  . FATIGUE 12/05/2007  . FIBROMYALGIA 06/26/2008  . GASTROENTERITIS, ACUTE 12/15/2008  . GERD 06/26/2008  . Headache(784.0) 09/13/2007  . HYPERLIPIDEMIA 09/13/2007  . JOINT EFFUSION, RIGHT KNEE 07/29/2009  . KNEE PAIN, LEFT 11/21/2010  . LUMBAR RADICULOPATHY, LEFT 06/26/2008  . OTHER DISEASE OF PHARYNX OR NASOPHARYNX 07/03/2008  . PERIPHERAL EDEMA 01/06/2009  . POLYARTHRITIS 11/21/2010  . SHOULDER PAIN, BILATERAL  07/03/2008  . SINUSITIS- ACUTE-NOS 07/29/2009  . TACHYCARDIA 12/05/2007  . THYROID NODULE, RIGHT 11/20/2008  . TREMOR 01/02/2008  . ADHD (attention deficit hyperactivity disorder)   . Vertigo    Past Surgical History  Procedure Laterality Date  . Abdominal hysterectomy      fibroids  . Thyroid fine needle aspiration  May 2008    showed non neoplastic goiter  . Back surgury      lumbar 2001  . Cesarean section      x 3   Family History  Problem Relation Age of Onset  . Thyroid disease Mother   . Diabetes Mother   . Asthma Mother   . Hypertension Father   . Hyperlipidemia Father   . Diabetes Father   . Emphysema Other   . Coronary artery disease Other   . Cancer Other     pancreatic and breast cancer  . Cancer Other     lung and prostate cancer   Social History  Substance Use Topics  . Smoking status: Never Smoker   . Smokeless tobacco: Never Used  . Alcohol Use: No   OB History    Gravida Para Term Preterm AB TAB SAB Ectopic Multiple Living   3 3 2 1      3      Review of Systems  Constitutional: Negative for fever and chills.  Gastrointestinal: Positive for abdominal pain. Negative for nausea, vomiting and diarrhea.  Genitourinary: Positive for dysuria and frequency.  All other systems reviewed and are negative.  Allergies  Sulfa antibiotics; Contrast media; Promethazine hcl;  and Tdap  Home Medications   Prior to Admission medications   Not on File   Triage Vitals: BP 132/82 mmHg  Pulse 118  Temp(Src) 99.3 F (37.4 C) (Oral)  Resp 18  Ht 5\' 5"  (1.651 m)  Wt 198 lb (89.812 kg)  BMI 32.95 kg/m2  SpO2 100%   Physical Exam  Constitutional: She is oriented to person, place, and time. She appears well-developed and well-nourished.  HENT:  Head: Normocephalic and atraumatic.  Cardiovascular: Regular rhythm.   No murmur heard. Tachycardic  Pulmonary/Chest: Effort normal and breath sounds normal. No respiratory distress.  Abdominal: Soft. There is no  tenderness. There is no rebound and no guarding.  Musculoskeletal: She exhibits no edema or tenderness.  Neurological: She is alert and oriented to person, place, and time.  Skin: Skin is warm and dry.  Psychiatric: She has a normal mood and affect. Her behavior is normal.  Nursing note and vitals reviewed.   ED Course  Procedures (including critical care time)  DIAGNOSTIC STUDIES: Oxygen Saturation is 100% on RA, normal by my interpretation.    COORDINATION OF CARE: 3:09 PM-Discussed treatment plan which includes Rx antibiotics with pt at bedside and pt agreed to plan.   Labs Review Labs Reviewed  URINALYSIS, ROUTINE W REFLEX MICROSCOPIC (NOT AT [facility]) - Abnormal; Notable for the following:    APPearance TURBID (*)    Glucose, UA >1000 (*)    Hgb urine dipstick LARGE (*)    Ketones, ur 15 (*)    Protein, ur 100 (*)    Nitrite POSITIVE (*)    Leukocytes, UA LARGE (*)    All other components within normal limits  URINE MICROSCOPIC-ADD ON - Abnormal; Notable for the following:    Squamous Epithelial / LPF FEW (*)    Bacteria, UA MANY (*)    All other components within normal limits    Imaging Review No results found. I have personally reviewed and evaluated these lab results as part of my medical decision-making.   EKG Interpretation None      MDM   Final diagnoses:  Acute UTI   Patient with history of diabetes not currently on medications here for dysuria for the last week. She is tachycardic with greater than 1000 glucose in her urine. Discussed with patient recommendation of checking labs to see how her sugar is, and if it is greatly elevated recommends checking electrolytes. Patient refuses fingerstick blood sugar as well as labs. Discussed with patient her tachycardia and she says this is her baseline, particularly if she is not feeling well. Discussed the risks of not performing tests and she understands the risks and prefers treatment with antibiotics with close  return precautions.  I personally performed the services described in this documentation, which was scribed in my presence. The recorded information has been reviewed and is accurate.      Quintella Reichert, MD 08/23/15 1524

## 2015-08-23 NOTE — ED Notes (Signed)
C/o freq urination and "spasms" after voiding x 1 week

## 2015-08-25 LAB — URINE CULTURE: Culture: >=100000

## 2015-08-26 ENCOUNTER — Telehealth (HOSPITAL_BASED_OUTPATIENT_CLINIC_OR_DEPARTMENT_OTHER): Payer: Self-pay | Admitting: Emergency Medicine

## 2015-08-26 NOTE — Telephone Encounter (Signed)
Post ED Visit - Positive Culture Follow-up  Culture report reviewed by antimicrobial stewardship pharmacist:  []  Elenor Quinones, Pharm.D. []  Heide Guile, Pharm.D., BCPS []  Parks Neptune, Pharm.D. []  Alycia Rossetti, Pharm.D., BCPS []  [place], Pharm.D., BCPS, AAHIVP []  Legrand Como, Pharm.D., BCPS, AAHIVP []  Cassie Clinkscale, Pharm.D. [x]  Stephens November, Pharm.D.  Positive urine culture E. coli Treated with cephalexin, organism sensitive to the same and no further patient follow-up is required at this time.  Hazle Nordmann 08/26/2015, 11:01 AM

## 2015-09-28 ENCOUNTER — Encounter (HOSPITAL_COMMUNITY): Payer: Self-pay

## 2015-09-28 ENCOUNTER — Inpatient Hospital Stay (HOSPITAL_COMMUNITY)
Admission: AD | Admit: 2015-09-28 | Discharge: 2015-09-28 | Disposition: A | Discharge status: Home / Self Care | Payer: BLUE CROSS/BLUE SHIELD | Source: Ambulatory Visit | Attending: Family Medicine | Admitting: Family Medicine

## 2015-09-28 DIAGNOSIS — N930 Postcoital and contact bleeding: Secondary | ICD-10-CM

## 2015-09-28 DIAGNOSIS — R102 Pelvic and perineal pain: Secondary | ICD-10-CM | POA: Diagnosis present

## 2015-09-28 DIAGNOSIS — B373 Candidiasis of vulva and vagina: Secondary | ICD-10-CM | POA: Diagnosis not present

## 2015-09-28 DIAGNOSIS — B3731 Acute candidiasis of vulva and vagina: Secondary | ICD-10-CM

## 2015-09-28 LAB — URINALYSIS, ROUTINE W REFLEX MICROSCOPIC
Bilirubin Urine: NEGATIVE
Glucose, UA: >1000 mg/dL — AB
Hgb urine dipstick: NEGATIVE
Ketones, ur: NEGATIVE mg/dL
Leukocytes, UA: NEGATIVE
Nitrite: NEGATIVE
Protein, ur: NEGATIVE mg/dL
Specific Gravity, Urine: >1.03 — ABNORMAL HIGH (ref 1.005–1.030)
pH: 5.5 (ref 5.0–8.0)

## 2015-09-28 LAB — URINE MICROSCOPIC-ADD ON

## 2015-09-28 LAB — WET PREP, GENITAL
Clue Cells Wet Prep HPF POC: NONE SEEN
Sperm: NONE SEEN
Trich, Wet Prep: NONE SEEN
WBC, Wet Prep HPF POC: NONE SEEN
Yeast Wet Prep HPF POC: NONE SEEN

## 2015-09-28 MED ORDER — NYSTATIN-TRIAMCINOLONE 100000-0.1 UNIT/GM-% EX CREA: TOPICAL_CREAM | CUTANEOUS | Status: DC

## 2015-09-28 MED ORDER — FLUCONAZOLE 150 MG PO TABS
ORAL_TABLET | ORAL | Status: DC
Start: 2015-09-28 — End: 2015-11-02

## 2015-09-28 NOTE — Discharge Instructions (Signed)

## 2015-09-28 NOTE — MAU Note (Signed)
Pt had intercourse this weekend, was using a toy, felt a sharp edge, started bleeding.  Attempted intercourse last night, was unable to because of pain.  Not bleeding or vaginal pain today.  Pt thinks she may have a yeast infection, white discharge.

## 2015-09-28 NOTE — MAU Provider Note (Signed)
Chief Complaint: Vaginal Pain   None     SUBJECTIVE HPI: Shirley [name] is a 39 y.o. [phone] who presents to maternity admissions reporting symptoms of yeast infection including white vaginal discharge and itching x 4 days and episode 3 days ago of vaginal bleeding x 2-3 hours after use of a sex toy.  Bleeding stopped without intervention. Then the patient attempted intercourse again yesterday without bleeding but with vaginal pain.  She has not tried anything for bleeding or pain.  She came in to be evaluated.   She denies vaginal bleeding, vaginal itching/burning, urinary symptoms, h/a, dizziness, n/v, or fever/chills.     HPI  Past Medical History  Diagnosis Date  . ALLERGIC RHINITIS 06/26/2008  . ANEMIA-IRON DEFICIENCY 06/26/2008  . 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  . ANKLE PAIN, LEFT 06/26/2008  . BACK PAIN, LUMBAR 10/15/2007  . BIPOLAR DISORDER UNSPECIFIED 09/13/2007  . CERVICAL RADICULOPATHY, LEFT 06/26/2008  . Cervicalgia 10/15/2007  . Chronic pain syndrome 03/21/2011  . COMMON MIGRAINE 06/26/2008  . DIABETES MELLITUS, TYPE II 06/05/2007  . DYSPNEA 12/05/2007  . EAR PAIN, RIGHT 12/05/2007  . ELEVATED BP READING WITHOUT DX HYPERTENSION 07/29/2009  . FATIGUE 12/05/2007  . FIBROMYALGIA 06/26/2008  . GASTROENTERITIS, ACUTE 12/15/2008  . GERD 06/26/2008  . Headache(784.0) 09/13/2007  . HYPERLIPIDEMIA 09/13/2007  . JOINT EFFUSION, RIGHT KNEE 07/29/2009  . KNEE PAIN, LEFT 11/21/2010  . LUMBAR RADICULOPATHY, LEFT 06/26/2008  . OTHER DISEASE OF PHARYNX OR NASOPHARYNX 07/03/2008  . PERIPHERAL EDEMA 01/06/2009  . POLYARTHRITIS 11/21/2010  . SHOULDER PAIN, BILATERAL 07/03/2008  . SINUSITIS- ACUTE-NOS 07/29/2009  . TACHYCARDIA 12/05/2007  . THYROID NODULE, RIGHT 11/20/2008  . TREMOR 01/02/2008  . ADHD (attention deficit hyperactivity disorder)   . Vertigo    Past Surgical History  Procedure Laterality Date  . Abdominal hysterectomy      fibroids  . Thyroid fine needle aspiration  May 2008    showed non  neoplastic goiter  . Back surgury      lumbar 2001  . Cesarean section      x 3   Social History   Social History  . Marital Status: Married    Spouse Name: N/A  . Number of Children: 3  . Years of Education: N/A   Occupational History  .     Social History Main Topics  . Smoking status: Never Smoker   . Smokeless tobacco: Never Used  . Alcohol Use: No  . Drug Use: No  . Sexual Activity: Yes    Birth Control/ Protection: Surgical   Other Topics Concern  . Not on file   Social History Narrative   No current facility-administered medications on file prior to encounter.   No current outpatient prescriptions on file prior to encounter.   Allergies  Allergen Reactions  . Sulfa Antibiotics Anaphylaxis  . Contrast Media [Iodinated Diagnostic Agents] Itching    Mri, severe heaving ~ can take benadryl without reaction  . Promethazine Hcl     Iv dose with benadryl causes severe aggrivation  . Tdap [Diphth-Acell Pertussis-Tetanus] Hives, Itching, Swelling, Anxiety and Rash    ROS:  Review of Systems  Constitutional: Negative for fever, chills and fatigue.  HENT: Negative for sinus pressure.   Eyes: Negative for photophobia.  Respiratory: Negative for shortness of breath.   Cardiovascular: Negative for chest pain.  Gastrointestinal: Negative for nausea, vomiting, diarrhea and constipation.  Genitourinary: Negative for dysuria, frequency, flank pain, vaginal bleeding, vaginal discharge, difficulty urinating, vaginal pain and pelvic pain.  Musculoskeletal: Negative for neck pain.  Neurological: Negative for dizziness, weakness and headaches.  Psychiatric/Behavioral: Negative.     I have reviewed patient's Past Medical Hx, Surgical Hx, Family Hx, Social Hx, medications and allergies.   Physical Exam   Patient Vitals for the past 24 hrs:  BP Temp Temp src Pulse Resp  09/28/15 1620 140/89 mmHg - - 114 -  09/28/15 1335 145/87 mmHg 97.6 F (36.4 C) Oral 108 16    Constitutional: Well-developed, well-nourished female in no acute distress.  Cardiovascular: normal rate Respiratory: normal effort GI: Abd soft, non-tender. Pos BS x 4 MS: Extremities nontender, no edema, normal ROM Neurologic: Alert and oriented x 4.  GU: Neg CVAT.  PELVIC EXAM: Cervix surgically absent, vaginal cuff without lesion, no evidence of laceration,significant , scant white creamy discharge, vaginal walls and external genitalia normal Bimanual exam: Cervix 0/long/high, firm, anterior, neg CMT, uterus nontender, nonenlarged, adnexa without tenderness, enlargement, or mass  LAB RESULTS Results for orders placed or performed during the hospital encounter of 09/28/15 (from the past 24 hour(s))  Urinalysis, Routine w reflex microscopic (not at [facility])     Status: Abnormal   Collection Time: 09/28/15  1:40 PM  Result Value Ref Range   Color, Urine YELLOW YELLOW   APPearance CLEAR CLEAR   Specific Gravity, Urine >1.030 (H) 1.005 - 1.030   pH 5.5 5.0 - 8.0   Glucose, UA >1000 (A) NEGATIVE mg/dL   Hgb urine dipstick NEGATIVE NEGATIVE   Bilirubin Urine NEGATIVE NEGATIVE   Ketones, ur NEGATIVE NEGATIVE mg/dL   Protein, ur NEGATIVE NEGATIVE mg/dL   Nitrite NEGATIVE NEGATIVE   Leukocytes, UA NEGATIVE NEGATIVE  Urine microscopic-add on     Status: Abnormal   Collection Time: 09/28/15  1:40 PM  Result Value Ref Range   Squamous Epithelial / LPF 0-5 (A) NONE SEEN   WBC, UA 0-5 0 - 5 WBC/hpf   RBC / HPF 0-5 0 - 5 RBC/hpf   Bacteria, UA FEW (A) NONE SEEN   Urine-Other YEAST PRESENT   Wet prep, genital     Status: None   Collection Time: 09/28/15  3:55 PM  Result Value Ref Range   Yeast Wet Prep HPF POC NONE SEEN NONE SEEN   Trich, Wet Prep NONE SEEN NONE SEEN   Clue Cells Wet Prep HPF POC NONE SEEN NONE SEEN   WBC, Wet Prep HPF POC NONE SEEN NONE SEEN   Sperm NONE SEEN        IMAGING No results found.  MAU Management/MDM: Ordered labs and reviewed results.  No evidence  of laceration on exam, but large amount of discharge consistent with candida.  Plan to D/C home with Rx for Diflucan and Mycolog.  Pelvic rest x 1 week.  Pt stable at time of discharge.  ASSESSMENT 1. Vaginal candidiasis   2. Postcoital bleeding     PLAN Discharge home Diflucan 150 mg PO x 2 doses 3 days apart Mycolog topical PRN Pelvic rest x 1 week F/U in [place] as needed    Medication List    TAKE these medications        fluconazole 150 MG tablet  Commonly known as:  DIFLUCAN  Take 1 dose now and 1 dose in 3 days.     nystatin-triamcinolone cream  Commonly known as:  MYCOLOG II  Apply to affected area daily     PROBIOTIC PO  Take 1 tablet by mouth daily.       Follow-up Information  Follow up with [facility].   Specialty:  Obstetrics and Gynecology   Why:  Or Gyn provider of your choice, As needed   Contact information:   [place] [phone]      Please follow up.   Why:  Return to MAU as needed for emergencies      Fatima Blank Certified Nurse-Midwife 09/28/2015  8:31 PM

## 2015-09-29 LAB — GC/CHLAMYDIA PROBE AMP (~~LOC~~) NOT AT ARMC
Chlamydia: NEGATIVE
Neisseria Gonorrhea: NEGATIVE

## 2015-11-02 ENCOUNTER — Emergency Department (HOSPITAL_BASED_OUTPATIENT_CLINIC_OR_DEPARTMENT_OTHER)
Admission: EM | Admit: 2015-11-02 | Discharge: 2015-11-02 | Disposition: A | Discharge status: Home / Self Care | Payer: BLUE CROSS/BLUE SHIELD | Attending: Emergency Medicine | Admitting: Emergency Medicine

## 2015-11-02 ENCOUNTER — Encounter (HOSPITAL_BASED_OUTPATIENT_CLINIC_OR_DEPARTMENT_OTHER): Payer: Self-pay

## 2015-11-02 ENCOUNTER — Emergency Department (HOSPITAL_BASED_OUTPATIENT_CLINIC_OR_DEPARTMENT_OTHER): Payer: BLUE CROSS/BLUE SHIELD

## 2015-11-02 DIAGNOSIS — Z8719 Personal history of other diseases of the digestive system: Secondary | ICD-10-CM | POA: Diagnosis not present

## 2015-11-02 DIAGNOSIS — Z8659 Personal history of other mental and behavioral disorders: Secondary | ICD-10-CM | POA: Diagnosis not present

## 2015-11-02 DIAGNOSIS — Z79899 Other long term (current) drug therapy: Secondary | ICD-10-CM | POA: Insufficient documentation

## 2015-11-02 DIAGNOSIS — Z8679 Personal history of other diseases of the circulatory system: Secondary | ICD-10-CM | POA: Insufficient documentation

## 2015-11-02 DIAGNOSIS — Z862 Personal history of diseases of the blood and blood-forming organs and certain disorders involving the immune mechanism: Secondary | ICD-10-CM | POA: Diagnosis not present

## 2015-11-02 DIAGNOSIS — Z8739 Personal history of other diseases of the musculoskeletal system and connective tissue: Secondary | ICD-10-CM | POA: Diagnosis not present

## 2015-11-02 DIAGNOSIS — Z8669 Personal history of other diseases of the nervous system and sense organs: Secondary | ICD-10-CM | POA: Insufficient documentation

## 2015-11-02 DIAGNOSIS — E119 Type 2 diabetes mellitus without complications: Secondary | ICD-10-CM | POA: Insufficient documentation

## 2015-11-02 DIAGNOSIS — R000 Tachycardia, unspecified: Secondary | ICD-10-CM | POA: Diagnosis not present

## 2015-11-02 LAB — CBC WITH DIFFERENTIAL/PLATELET
Basophils Absolute: 0 10*3/uL (ref 0.0–0.1)
Basophils Relative: 0 %
Eosinophils Absolute: 0 10*3/uL (ref 0.0–0.7)
Eosinophils Relative: 0 %
HCT: 40.8 % (ref 36.0–46.0)
Hemoglobin: 13.2 g/dL (ref 12.0–15.0)
Lymphocytes Relative: 44 %
Lymphs Abs: 3.6 10*3/uL (ref 0.7–4.0)
MCH: 27 pg (ref 26.0–34.0)
MCHC: 32.4 g/dL (ref 30.0–36.0)
MCV: 83.6 fL (ref 78.0–100.0)
Monocytes Absolute: 0.6 10*3/uL (ref 0.1–1.0)
Monocytes Relative: 7 %
Neutro Abs: 3.9 10*3/uL (ref 1.7–7.7)
Neutrophils Relative %: 49 %
Platelets: 397 10*3/uL (ref 150–400)
RBC: 4.88 MIL/uL (ref 3.87–5.11)
RDW: 13.9 % (ref 11.5–15.5)
WBC: 8.1 10*3/uL (ref 4.0–10.5)

## 2015-11-02 LAB — BASIC METABOLIC PANEL
Anion gap: 6 (ref 5–15)
BUN: 9 mg/dL (ref 6–20)
CO2: 26 mmol/L (ref 22–32)
Calcium: 9.3 mg/dL (ref 8.9–10.3)
Chloride: 104 mmol/L (ref 101–111)
Creatinine, Ser: 0.53 mg/dL (ref 0.44–1.00)
GFR calc Af Amer: >60 mL/min (ref >60)
GFR calc non Af Amer: >60 mL/min (ref >60)
Glucose, Bld: 198 mg/dL — ABNORMAL HIGH (ref 65–99)
Potassium: 4.1 mmol/L (ref 3.5–5.1)
Sodium: 136 mmol/L (ref 135–145)

## 2015-11-02 LAB — TROPONIN I: Troponin I: <0.03 ng/mL (ref <0.031)

## 2015-11-02 LAB — TSH: TSH: 0.66 u[IU]/mL (ref 0.350–4.500)

## 2015-11-02 NOTE — ED Notes (Addendum)
C/o elevated HR since 2pm-denies CP-reports hx of same 08/2015 with 5-6 episodes-states she has noticed episodes with caffeine-states she had coffee and mt dew soda today-[place] ED x 1-no PCP f/u

## 2015-11-02 NOTE — ED Provider Notes (Signed)
CSN: [id number]     Arrival date & time 11/02/15  1525 History   First MD Initiated Contact with Patient 11/02/15 1547     Chief Complaint  Patient presents with  . Tachycardia     The history is provided by the patient. No language interpreter was used.   Shirley [name] is a 40 y.o. female who presents to the Emergency Department complaining of tachycardia.  She has a hx/o intermittent elevated heart rate for the last month.  Episodes occur with stress and activity.  She reports that they occur more often with caffeine use - today she had an iced coffee, [facility] and Dr. Malachi Bonds.  She feels like her heart rate is elevated.  Denies chest pain, SOB, abdominal pain, dysuria, leg swelling. Sxs are moderate, waxing and waning, worsening.   Past Medical History  Diagnosis Date  . ALLERGIC RHINITIS 06/26/2008  . ANEMIA-IRON DEFICIENCY 06/26/2008  . 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  . ANKLE PAIN, LEFT 06/26/2008  . BACK PAIN, LUMBAR 10/15/2007  . BIPOLAR DISORDER UNSPECIFIED 09/13/2007  . CERVICAL RADICULOPATHY, LEFT 06/26/2008  . Cervicalgia 10/15/2007  . Chronic pain syndrome 03/21/2011  . COMMON MIGRAINE 06/26/2008  . DIABETES MELLITUS, TYPE II 06/05/2007  . DYSPNEA 12/05/2007  . EAR PAIN, RIGHT 12/05/2007  . ELEVATED BP READING WITHOUT DX HYPERTENSION 07/29/2009  . FATIGUE 12/05/2007  . FIBROMYALGIA 06/26/2008  . GASTROENTERITIS, ACUTE 12/15/2008  . GERD 06/26/2008  . Headache(784.0) 09/13/2007  . HYPERLIPIDEMIA 09/13/2007  . JOINT EFFUSION, RIGHT KNEE 07/29/2009  . KNEE PAIN, LEFT 11/21/2010  . LUMBAR RADICULOPATHY, LEFT 06/26/2008  . OTHER DISEASE OF PHARYNX OR NASOPHARYNX 07/03/2008  . PERIPHERAL EDEMA 01/06/2009  . POLYARTHRITIS 11/21/2010  . SHOULDER PAIN, BILATERAL 07/03/2008  . SINUSITIS- ACUTE-NOS 07/29/2009  . TACHYCARDIA 12/05/2007  . THYROID NODULE, RIGHT 11/20/2008  . TREMOR 01/02/2008  . ADHD (attention deficit hyperactivity disorder)   . Vertigo    Past Surgical History  Procedure Laterality  Date  . Abdominal hysterectomy      fibroids  . Thyroid fine needle aspiration  May 2008    showed non neoplastic goiter  . Back surgury      lumbar 2001  . Cesarean section      x 3   Family History  Problem Relation Age of Onset  . Thyroid disease Mother   . Diabetes Mother   . Asthma Mother   . Hypertension Father   . Hyperlipidemia Father   . Diabetes Father   . Emphysema Other   . Coronary artery disease Other   . Cancer Other     pancreatic and breast cancer  . Cancer Other     lung and prostate cancer   Social History  Substance Use Topics  . Smoking status: Never Smoker   . Smokeless tobacco: Never Used  . Alcohol Use: No   OB History    Gravida Para Term Preterm AB TAB SAB Ectopic Multiple Living   3 3 2 1      3      Review of Systems  All other systems reviewed and are negative.     Allergies  Sulfa antibiotics; Contrast media; Promethazine hcl; and Tdap  Home Medications   Prior to Admission medications   Medication Sig Start Date End Date Taking? Authorizing Provider  Probiotic Product (PROBIOTIC PO) Take 1 tablet by mouth daily.    Historical Provider, MD   BP 119/78 mmHg  Pulse 100  Temp(Src) 98.2 F (36.8 C) (Oral)  Resp  24  Ht 5\' 5"  (1.651 m)  Wt 196 lb (88.905 kg)  BMI 32.62 kg/m2  SpO2 98% Physical Exam  Constitutional: She is oriented to person, place, and time. She appears well-developed and well-nourished.  HENT:  Head: Normocephalic and atraumatic.  Cardiovascular: Regular rhythm.   No murmur heard. tachycardic  Pulmonary/Chest: Effort normal and breath sounds normal. No respiratory distress.  Abdominal: Soft. There is no tenderness. There is no rebound and no guarding.  Musculoskeletal: She exhibits no edema or tenderness.  Neurological: She is alert and oriented to person, place, and time.  Skin: Skin is warm and dry.  Psychiatric: She has a normal mood and affect. Her behavior is normal.  Nursing note and vitals  reviewed.   ED Course  Procedures (including critical care time) Labs Review Labs Reviewed  BASIC METABOLIC PANEL - Abnormal; Notable for the following:    Glucose, Bld 198 (*)    All other components within normal limits  CBC WITH DIFFERENTIAL/PLATELET  TROPONIN I  TSH    Imaging Review Dg Chest 2 View  11/02/2015  CLINICAL DATA:  Tachycardia. EXAM: CHEST  2 VIEW COMPARISON:  11/07/2013 . FINDINGS: Mediastinum and hilar structures normal. Lungs are clear. Heart size normal. No pleural effusion or pneumothorax. No acute bony abnormality identified. IMPRESSION: No acute cardiopulmonary disease. Electronically Signed   By: Marcello Moores  Register   On: 11/02/2015 16:34   I have personally reviewed and evaluated these images and lab results as part of my medical decision-making.   EKG Interpretation   Date/Time:  Tuesday November 02 2015 15:37:05 EST Ventricular Rate:  106 PR Interval:  152 QRS Duration: 94 QT Interval:  350 QTC Calculation: 464 R Axis:   95 Text Interpretation:  Sinus tachycardia Rightward axis Borderline ECG  Confirmed by Hazle Coca [phone]) on 11/02/2015 3:51:27 PM      MDM   Final diagnoses:  Sinus tachycardia [facility])    Patient here for evaluation of palpitations, she is tachycardic in the emergency department. Presentation is not consistent with ACS, CHF, PE, pneumonia. Discussed with patient cardiology follow-up, avoiding all stimulant medications. Also discussed that her glucose is elevated and this will need to be followed up by her family doctor. Thyroid functions were sent, these will not be reported out during her ED stay discussed this also needs to be followed up by her family doctor.    Quintella Reichert, MD 11/03/15 [phone]

## 2015-11-02 NOTE — Discharge Instructions (Signed)
Avoid caffeine and sugary foods. Your blood sugar was elevated today. The results of your thyroid tests will not be available today. You will need follow-up with your family doctor to get these results. Get rechecked immediately if you develops any chest pain, difficulty breathing, new concerning symptoms.   Nonspecific Tachycardia Tachycardia is a faster than normal heartbeat (more than 100 beats per minute). In adults, the heart normally beats between 60 and 100 times a minute. A fast heartbeat may be a normal response to exercise or stress. It does not necessarily mean that something is wrong. However, sometimes when your heart beats too fast it may not be able to pump enough blood to the rest of your body. This can result in chest pain, shortness of breath, dizziness, and even fainting. Nonspecific tachycardia means that the specific cause or pattern of your tachycardia is unknown. CAUSES  Tachycardia may be harmless or it may be due to a more serious underlying cause. Possible causes of tachycardia include:  Exercise or exertion.  Fever.  Pain or injury.  Infection.  Loss of body fluids (dehydration).  Overactive thyroid.  Lack of red blood cells (anemia).  Anxiety and stress.  Alcohol.  Caffeine.  Tobacco products.  Diet pills.  Illegal drugs.  Heart disease. SYMPTOMS  Rapid or irregular heartbeat (palpitations).  Suddenly feeling your heart beating (cardiac awareness).  Dizziness.  Tiredness (fatigue).  Shortness of breath.  Chest pain.  Nausea.  Fainting. DIAGNOSIS  Your caregiver will perform a physical exam and take your medical history. In some cases, a heart specialist (cardiologist) may be consulted. Your caregiver may also order:  Blood tests.  Electrocardiography. This test records the electrical activity of your heart.  A heart monitoring test. TREATMENT  Treatment will depend on the likely cause of your tachycardia. The goal is to treat  the underlying cause of your tachycardia. Treatment methods may include:  Replacement of fluids or blood through an intravenous (IV) tube for moderate to severe dehydration or anemia.  New medicines or changes in your current medicines.  Diet and lifestyle changes.  Treatment for certain infections.  Stress relief or relaxation methods. HOME CARE INSTRUCTIONS   Rest.  Drink enough fluids to keep your urine clear or pale yellow.  Do not smoke.  Avoid:  Caffeine.  Tobacco.  Alcohol.  Chocolate.  Stimulants such as over-the-counter diet pills or pills that help you stay awake.  Situations that cause anxiety or stress.  Illegal drugs such as marijuana, phencyclidine (PCP), and cocaine.  Only take medicine as directed by your caregiver.  Keep all follow-up appointments as directed by your caregiver. SEEK IMMEDIATE MEDICAL CARE IF:   You have pain in your chest, upper arms, jaw, or neck.  You become weak, dizzy, or feel faint.  You have palpitations that will not go away.  You vomit, have diarrhea, or pass blood in your stool.  Your skin is cool, pale, and wet.  You have a fever that will not go away with rest, fluids, and medicine. MAKE SURE YOU:   Understand these instructions.  Will watch your condition.  Will get help right away if you are not doing well or get worse.   This information is not intended to replace advice given to you by your health care provider. Make sure you discuss any questions you have with your health care provider.   Document Released: 11/16/2004 Document Revised: 01/01/2012 Document Reviewed: 04/23/2015 Elsevier Interactive Patient Education [employer].

## 2015-12-01 ENCOUNTER — Encounter: Payer: Self-pay | Admitting: Internal Medicine

## 2015-12-01 ENCOUNTER — Ambulatory Visit (INDEPENDENT_AMBULATORY_CARE_PROVIDER_SITE_OTHER): Payer: BLUE CROSS/BLUE SHIELD | Admitting: Internal Medicine

## 2015-12-01 VITALS — BP 130/72 | HR 98 | Temp 98.6°F | Resp 20 | Wt 205.0 lb

## 2015-12-01 DIAGNOSIS — E119 Type 2 diabetes mellitus without complications: Secondary | ICD-10-CM | POA: Diagnosis not present

## 2015-12-01 DIAGNOSIS — E785 Hyperlipidemia, unspecified: Secondary | ICD-10-CM | POA: Diagnosis not present

## 2015-12-01 DIAGNOSIS — M545 Low back pain, unspecified: Secondary | ICD-10-CM

## 2015-12-01 DIAGNOSIS — M79604 Pain in right leg: Secondary | ICD-10-CM

## 2015-12-01 HISTORY — DX: Pain in right leg: M79.604

## 2015-12-01 HISTORY — DX: Low back pain, unspecified: M54.50

## 2015-12-01 MED ORDER — CYCLOBENZAPRINE HCL 5 MG PO TABS: 5.0000 mg | ORAL_TABLET | Freq: Three times a day (TID) | ORAL | Status: DC | PRN

## 2015-12-01 MED ORDER — MELOXICAM 15 MG PO TABS: 15.0000 mg | ORAL_TABLET | Freq: Every day | ORAL | Status: DC | PRN

## 2015-12-01 MED ORDER — HYDROCODONE-ACETAMINOPHEN 5-325 MG PO TABS: 1.0000 | ORAL_TABLET | Freq: Four times a day (QID) | ORAL | Status: DC | PRN

## 2015-12-01 NOTE — Progress Notes (Signed)
Subjective:    Patient ID: Shirley [name], female    DOB: 03-19-1976, 40 y.o.   MRN: [id number]  HPI Here to f/u; overall doing ok,  Pt denies chest pain, increasing sob or doe, wheezing, orthopnea, PND, increased LE swelling, palpitations, dizziness or syncope.  Pt denies new neurological symptoms such as new headache, or facial or extremity weakness or numbness.  Pt denies polydipsia, polyuria, or low sugar episode.   Pt denies new neurological symptoms such as new headache, or facial or extremity weakness or numbness.   Pt states overall good compliance with meds, mostly trying to follow appropriate diet  Pt continues to have 1 wk recurring intermittent sharp right > left LBP without change in severity, bowel or bladder change, fever, wt loss,  worsening LE pain/numbness/weakness, gait change or falls. Pain worse to stand up or bend.  Better to sit or lie down.  Past Medical History  Diagnosis Date  . ALLERGIC RHINITIS 06/26/2008  . ANEMIA-IRON DEFICIENCY 06/26/2008  . 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  . ANKLE PAIN, LEFT 06/26/2008  . BACK PAIN, LUMBAR 10/15/2007  . BIPOLAR DISORDER UNSPECIFIED 09/13/2007  . CERVICAL RADICULOPATHY, LEFT 06/26/2008  . Cervicalgia 10/15/2007  . Chronic pain syndrome 03/21/2011  . COMMON MIGRAINE 06/26/2008  . DIABETES MELLITUS, TYPE II 06/05/2007  . DYSPNEA 12/05/2007  . EAR PAIN, RIGHT 12/05/2007  . ELEVATED BP READING WITHOUT DX HYPERTENSION 07/29/2009  . FATIGUE 12/05/2007  . FIBROMYALGIA 06/26/2008  . GASTROENTERITIS, ACUTE 12/15/2008  . GERD 06/26/2008  . Headache(784.0) 09/13/2007  . HYPERLIPIDEMIA 09/13/2007  . JOINT EFFUSION, RIGHT KNEE 07/29/2009  . KNEE PAIN, LEFT 11/21/2010  . LUMBAR RADICULOPATHY, LEFT 06/26/2008  . OTHER DISEASE OF PHARYNX OR NASOPHARYNX 07/03/2008  . PERIPHERAL EDEMA 01/06/2009  . POLYARTHRITIS 11/21/2010  . SHOULDER PAIN, BILATERAL 07/03/2008  . SINUSITIS- ACUTE-NOS 07/29/2009  . TACHYCARDIA 12/05/2007  . THYROID NODULE, RIGHT 11/20/2008  . TREMOR  01/02/2008  . ADHD (attention deficit hyperactivity disorder)   . Vertigo    Past Surgical History  Procedure Laterality Date  . Abdominal hysterectomy      fibroids  . Thyroid fine needle aspiration  May 2008    showed non neoplastic goiter  . Back surgury      lumbar 2001  . Cesarean section      x 3    reports that she has never smoked. She has never used smokeless tobacco. She reports that she does not drink alcohol or use illicit drugs. family history includes Asthma in her mother; Cancer in her other and other; Coronary artery disease in her other; Diabetes in her father and mother; Emphysema in her other; Hyperlipidemia in her father; Hypertension in her father; Thyroid disease in her mother. Allergies  Allergen Reactions  . Sulfa Antibiotics Anaphylaxis  . Contrast Media [Iodinated Diagnostic Agents] Itching    Mri, severe heaving ~ can take benadryl without reaction  . Promethazine Hcl     Iv dose with benadryl causes severe aggrivation  . Tdap [Diphth-Acell Pertussis-Tetanus] Hives, Itching, Swelling, Anxiety and Rash   Current Outpatient Prescriptions on File Prior to Visit  Medication Sig Dispense Refill  . Probiotic Product (PROBIOTIC PO) Take 1 tablet by mouth daily.     No current facility-administered medications on file prior to visit.   Review of Systems  Constitutional: Negative for unusual diaphoresis or night sweats HENT: Negative for ringing in ear or discharge Eyes: Negative for double vision or worsening visual disturbance.  Respiratory: Negative for choking and stridor.  Gastrointestinal: Negative for vomiting or other signifcant bowel change Genitourinary: Negative for hematuria or change in urine volume.  Musculoskeletal: Negative for other MSK pain or swelling Skin: Negative for color change and worsening wound.  Neurological: Negative for tremors and numbness other than noted  Psychiatric/Behavioral: Negative for decreased concentration or  agitation other than above       Objective:   Physical Exam BP 130/72 mmHg  Pulse 98  Temp(Src) 98.6 F (37 C) (Oral)  Resp 20  Wt 205 lb (92.987 kg)  SpO2 97% VS noted,  Constitutional: Pt appears in no significant distress HENT: Head: NCAT.  Right Ear: External ear normal.  Left Ear: External ear normal.  Eyes: . Pupils are equal, round, and reactive to light. Conjunctivae and EOM are normal Neck: Normal range of motion. Neck supple.  Cardiovascular: Normal rate and regular rhythm.   Pulmonary/Chest: Effort normal and breath sounds without rales or wheezing.  Abd:  Soft, NT, ND, + BS Spine: nontender midline, but has bilat lumbar paravertebral spasm/tenderness Neurological: Pt is alert. Not confused , motor 5/5 intact, gait/dtr intact Skin: Skin is warm. No rash, no LE edema Psychiatric: Pt behavior is normal. No agitation.     Assessment & Plan:

## 2015-12-01 NOTE — Progress Notes (Signed)
Pre visit review using our clinic review tool, if applicable. No additional management support is needed unless otherwise documented below in the visit note. 

## 2015-12-01 NOTE — Patient Instructions (Addendum)
Please take all new medication as prescribed - the pain medication, anti-inflammatory, and muscle relaxer as needed  Please continue all other medications as before, and refills have been done if requested.  Please have the pharmacy call with any other refills you may need.  Please keep your appointments with your specialists as you may have planned

## 2015-12-02 ENCOUNTER — Telehealth: Payer: Self-pay

## 2015-12-02 ENCOUNTER — Ambulatory Visit: Payer: BLUE CROSS/BLUE SHIELD | Admitting: Internal Medicine

## 2015-12-02 NOTE — Telephone Encounter (Signed)
[place] for not to say no lifiting over 20 lbs for 2 wks

## 2015-12-02 NOTE — Telephone Encounter (Signed)
Patient is needing a note for work so that she can be on light duty. Please advise

## 2015-12-05 NOTE — Assessment & Plan Note (Signed)
Mild to mod, c/w msk spasm, no neuro changes, for pain control, muscle relaxer prn,  to f/u any worsening symptoms or concerns or worsening neuro changes

## 2015-12-05 NOTE — Assessment & Plan Note (Signed)
stable overall by history and exam, recent data reviewed with pt, and pt to continue medical treatment as before,  to f/u any worsening symptoms or concerns Lab Results  Component Value Date   HGBA1C 12.2* 05/26/2011  pt declines f/u lab today,  to f/u any worsening symptoms or concerns

## 2015-12-05 NOTE — Assessment & Plan Note (Signed)
Control unclear, declines lab or statin trial at this time, goal LDL < 70

## 2015-12-20 ENCOUNTER — Encounter: Payer: Self-pay | Admitting: Physician Assistant

## 2015-12-20 ENCOUNTER — Ambulatory Visit (HOSPITAL_BASED_OUTPATIENT_CLINIC_OR_DEPARTMENT_OTHER)
Admission: RE | Admit: 2015-12-20 | Discharge: 2015-12-20 | Disposition: A | Discharge status: Home / Self Care | Payer: BLUE CROSS/BLUE SHIELD | Source: Ambulatory Visit | Attending: Physician Assistant | Admitting: Physician Assistant

## 2015-12-20 ENCOUNTER — Ambulatory Visit (INDEPENDENT_AMBULATORY_CARE_PROVIDER_SITE_OTHER): Payer: BLUE CROSS/BLUE SHIELD | Admitting: Physician Assistant

## 2015-12-20 VITALS — BP 132/91 | HR 97 | Temp 98.0°F | Ht 65.0 in | Wt 209.2 lb

## 2015-12-20 DIAGNOSIS — S92531A Displaced fracture of distal phalanx of right lesser toe(s), initial encounter for closed fracture: Secondary | ICD-10-CM | POA: Diagnosis not present

## 2015-12-20 DIAGNOSIS — S99921A Unspecified injury of right foot, initial encounter: Secondary | ICD-10-CM

## 2015-12-20 DIAGNOSIS — Y9301 Activity, walking, marching and hiking: Secondary | ICD-10-CM | POA: Diagnosis not present

## 2015-12-20 DIAGNOSIS — M546 Pain in thoracic spine: Secondary | ICD-10-CM | POA: Diagnosis not present

## 2015-12-20 DIAGNOSIS — S99929A Unspecified injury of unspecified foot, initial encounter: Secondary | ICD-10-CM | POA: Insufficient documentation

## 2015-12-20 HISTORY — DX: Pain in thoracic spine: M54.6

## 2015-12-20 MED ORDER — HYDROCODONE-ACETAMINOPHEN 10-325 MG PO TABS: 1.0000 | ORAL_TABLET | Freq: Three times a day (TID) | ORAL | Status: DC | PRN

## 2015-12-20 MED ORDER — CYCLOBENZAPRINE HCL 10 MG PO TABS: 10.0000 mg | ORAL_TABLET | Freq: Every day | ORAL | Status: DC

## 2015-12-20 NOTE — Assessment & Plan Note (Signed)
Swelling, bruising and significant pain with palpation over MTP and DIP joints of 5th phalanx of R foot. Will check x-ray to assess for fracture. Buddy taping reviewed. Pain medications as directed.

## 2015-12-20 NOTE — Progress Notes (Signed)
Patient presents to clinic today c/o upper back pain. Endorses heavy lifting at work this past week (Thursday). Was lifting a heavy rack overhead at work to prevent it from falling. Patient endorses feeling a pulling sensation in her back followed by tingling in upper extremities which has resolved. Endorses pain is constant and described as pulling and achy in nature, worse with bending over. Pain is ranked at a 5/10. Has taken Norco at night only for relief in pain as she cannot take while working. Is helping some.   Patient also endorses pain in R foot since this AM after hitting her foot into her son's leg. Endorses hearing a popping sound and pain that followed. Endorses significant pain in 5th phalanx of R foot since that time with swelling. Denies bruising.   Past Medical History  Diagnosis Date  . ALLERGIC RHINITIS 06/26/2008  . ANEMIA-IRON DEFICIENCY 06/26/2008  . 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  . ANKLE PAIN, LEFT 06/26/2008  . BACK PAIN, LUMBAR 10/15/2007  . BIPOLAR DISORDER UNSPECIFIED 09/13/2007  . CERVICAL RADICULOPATHY, LEFT 06/26/2008  . Cervicalgia 10/15/2007  . Chronic pain syndrome 03/21/2011  . COMMON MIGRAINE 06/26/2008  . DIABETES MELLITUS, TYPE II 06/05/2007  . DYSPNEA 12/05/2007  . EAR PAIN, RIGHT 12/05/2007  . ELEVATED BP READING WITHOUT DX HYPERTENSION 07/29/2009  . FATIGUE 12/05/2007  . FIBROMYALGIA 06/26/2008  . GASTROENTERITIS, ACUTE 12/15/2008  . GERD 06/26/2008  . Headache(784.0) 09/13/2007  . HYPERLIPIDEMIA 09/13/2007  . JOINT EFFUSION, RIGHT KNEE 07/29/2009  . KNEE PAIN, LEFT 11/21/2010  . LUMBAR RADICULOPATHY, LEFT 06/26/2008  . OTHER DISEASE OF PHARYNX OR NASOPHARYNX 07/03/2008  . PERIPHERAL EDEMA 01/06/2009  . POLYARTHRITIS 11/21/2010  . SHOULDER PAIN, BILATERAL 07/03/2008  . SINUSITIS- ACUTE-NOS 07/29/2009  . TACHYCARDIA 12/05/2007  . THYROID NODULE, RIGHT 11/20/2008  . TREMOR 01/02/2008  . ADHD (attention deficit hyperactivity disorder)   . Vertigo     Current Outpatient  Prescriptions on File Prior to Visit  Medication Sig Dispense Refill  . meloxicam (MOBIC) 15 MG tablet Take 1 tablet (15 mg total) by mouth daily as needed for pain. 30 tablet 0  . Probiotic Product (PROBIOTIC PO) Take 1 tablet by mouth daily.     No current facility-administered medications on file prior to visit.    Allergies  Allergen Reactions  . Sulfa Antibiotics Anaphylaxis  . Contrast Media [Iodinated Diagnostic Agents] Itching    Mri, severe heaving ~ can take benadryl without reaction  . Promethazine Hcl     Iv dose with benadryl causes severe aggrivation  . Tdap [Diphth-Acell Pertussis-Tetanus] Hives, Itching, Swelling, Anxiety and Rash    Family History  Problem Relation Age of Onset  . Thyroid disease Mother   . Diabetes Mother   . Asthma Mother   . Hypertension Father   . Hyperlipidemia Father   . Diabetes Father   . Emphysema Other   . Coronary artery disease Other   . Cancer Other     pancreatic and breast cancer  . Cancer Other     lung and prostate cancer    Social History   Social History  . Marital Status: Married    Spouse Name: N/A  . Number of Children: 3  . Years of Education: N/A   Occupational History  .     Social History Main Topics  . Smoking status: Never Smoker   . Smokeless tobacco: Never Used  . Alcohol Use: No  . Drug Use: No  . Sexual Activity: Yes  Birth Control/ Protection: Surgical   Other Topics Concern  . None   Social History Narrative    Review of Systems - See HPI.  All other ROS are negative.  BP 132/91 mmHg  Pulse 97  Temp(Src) 98 F (36.7 C) (Oral)  Ht '5\' 5"'  (1.651 m)  Wt 209 lb 3.2 oz (94.892 kg)  BMI 34.81 kg/m2  SpO2 100%  Physical Exam  Constitutional: She is oriented to person, place, and time and well-developed, well-nourished, and in no distress.  HENT:  Head: Normocephalic and atraumatic.  Cardiovascular: Normal rate, regular rhythm, normal heart sounds and intact distal pulses.     Pulmonary/Chest: Effort normal.  Musculoskeletal:       Cervical back: She exhibits tenderness and spasm.       Thoracic back: She exhibits tenderness and spasm.       Feet:  Neurological: She is alert and oriented to person, place, and time.  Skin: Skin is warm and dry. No rash noted.  Psychiatric: Affect normal.  Vitals reviewed.   Recent Results (from the past 2160 hour(s))  GC/Chlamydia probe amp ([place])not at [facility]     Status: None   Collection Time: 09/28/15 12:00 AM  Result Value Ref Range   Chlamydia Negative     Comment: Normal Reference Range - Negative   Neisseria gonorrhea Negative     Comment: Normal Reference Range - Negative  Urinalysis, Routine w reflex microscopic (not at [facility])     Status: Abnormal   Collection Time: 09/28/15  1:40 PM  Result Value Ref Range   Color, Urine YELLOW YELLOW   APPearance CLEAR CLEAR   Specific Gravity, Urine >1.030 (H) 1.005 - 1.030   pH 5.5 5.0 - 8.0   Glucose, UA >1000 (A) NEGATIVE mg/dL   Hgb urine dipstick NEGATIVE NEGATIVE   Bilirubin Urine NEGATIVE NEGATIVE   Ketones, ur NEGATIVE NEGATIVE mg/dL   Protein, ur NEGATIVE NEGATIVE mg/dL   Nitrite NEGATIVE NEGATIVE   Leukocytes, UA NEGATIVE NEGATIVE  Urine microscopic-add on     Status: Abnormal   Collection Time: 09/28/15  1:40 PM  Result Value Ref Range   Squamous Epithelial / LPF 0-5 (A) NONE SEEN   WBC, UA 0-5 0 - 5 WBC/hpf   RBC / HPF 0-5 0 - 5 RBC/hpf   Bacteria, UA FEW (A) NONE SEEN   Urine-Other YEAST PRESENT   Wet prep, genital     Status: None   Collection Time: 09/28/15  3:55 PM  Result Value Ref Range   Yeast Wet Prep HPF POC NONE SEEN NONE SEEN   Trich, Wet Prep NONE SEEN NONE SEEN   Clue Cells Wet Prep HPF POC NONE SEEN NONE SEEN   WBC, Wet Prep HPF POC NONE SEEN NONE SEEN    Comment: BACTERIA- TOO NUMEROUS TO COUNT   Sperm NONE SEEN   Basic metabolic panel     Status: Abnormal   Collection Time: 11/02/15  4:10 PM  Result Value Ref Range   Sodium  136 135 - 145 mmol/L   Potassium 4.1 3.5 - 5.1 mmol/L   Chloride 104 101 - 111 mmol/L   CO2 26 22 - 32 mmol/L   Glucose, Bld 198 (H) 65 - 99 mg/dL   BUN 9 6 - 20 mg/dL   Creatinine, Ser 0.53 0.44 - 1.00 mg/dL   Calcium 9.3 8.9 - 10.3 mg/dL   GFR calc non Af Amer >60 >60 mL/min   GFR calc Af Amer >60 >60  mL/min    Comment: (NOTE) The eGFR has been calculated using the CKD EPI equation. This calculation has not been validated in all clinical situations. eGFR's persistently <60 mL/min signify possible Chronic Kidney Disease.    Anion gap 6 5 - 15  CBC with Differential     Status: None   Collection Time: 11/02/15  4:10 PM  Result Value Ref Range   WBC 8.1 4.0 - 10.5 K/uL   RBC 4.88 3.87 - 5.11 MIL/uL   Hemoglobin 13.2 12.0 - 15.0 g/dL   HCT 40.8 36.0 - 46.0 %   MCV 83.6 78.0 - 100.0 fL   MCH 27.0 26.0 - 34.0 pg   MCHC 32.4 30.0 - 36.0 g/dL   RDW 13.9 11.5 - 15.5 %   Platelets 397 150 - 400 K/uL   Neutrophils Relative % 49 %   Lymphocytes Relative 44 %   Monocytes Relative 7 %   Eosinophils Relative 0 %   Basophils Relative 0 %   Neutro Abs 3.9 1.7 - 7.7 K/uL   Lymphs Abs 3.6 0.7 - 4.0 K/uL   Monocytes Absolute 0.6 0.1 - 1.0 K/uL   Eosinophils Absolute 0.0 0.0 - 0.7 K/uL   Basophils Absolute 0.0 0.0 - 0.1 K/uL   WBC Morphology ATYPICAL LYMPHOCYTES    Smear Review LARGE PLATELETS PRESENT   Troponin I     Status: None   Collection Time: 11/02/15  4:10 PM  Result Value Ref Range   Troponin I <0.03 <0.031 ng/mL    Comment:        NO INDICATION OF MYOCARDIAL INJURY.   TSH     Status: None   Collection Time: 11/02/15  4:10 PM  Result Value Ref Range   TSH 0.660 0.350 - 4.500 uIU/mL    Comment: Performed at [facility]    Assessment/Plan: Foot injury Swelling, bruising and significant pain with palpation over MTP and DIP joints of 5th phalanx of R foot. Will check x-ray to assess for fracture. Buddy taping reviewed. Pain medications as directed.  Bilateral  thoracic back pain Muscular etiology. Rx Flexeril 10 mg PO QHS. Rx Hydrocodone 10-325 as directed. Supportive measures reviewed. Patient taken out of work for the rest of the week. Follow-up with PCP if symptoms are not resolving.

## 2015-12-20 NOTE — Patient Instructions (Signed)
Please go downstairs for x-ray. I will call with your results. Please take Flexeril at bedtime.  Use Hydrocodone (new dose) as directed for pain. Heating pad for 10 minutes -- 2 x per day. Avoid heavy lifting or overexertion.  For your toe, please go downstairs for x-ray. I will call with your results. Pain medications as directed. Tape the 4th and 5th toes together to help with pain.

## 2015-12-20 NOTE — Assessment & Plan Note (Signed)
Muscular etiology. Rx Flexeril 10 mg PO QHS. Rx Hydrocodone 10-325 as directed. Supportive measures reviewed. Patient taken out of work for the rest of the week. Follow-up with PCP if symptoms are not resolving.

## 2015-12-20 NOTE — Progress Notes (Signed)
Pre visit review using our clinic review tool, if applicable. No additional management support is needed unless otherwise documented below in the visit note. 

## 2015-12-27 ENCOUNTER — Telehealth: Payer: Self-pay | Admitting: Internal Medicine

## 2015-12-27 DIAGNOSIS — S92919A Unspecified fracture of unspecified toe(s), initial encounter for closed fracture: Secondary | ICD-10-CM

## 2015-12-27 NOTE — Telephone Encounter (Signed)
Pt says that she was seen by Einar Pheasant and was told that he would put in a referral because she injured her toe. Pt would like to be advised further.    CB: [phone]

## 2015-12-27 NOTE — Telephone Encounter (Signed)
Patient is scheduled to see Dr French Ana today @ 2:45, pt aware

## 2015-12-27 NOTE — Telephone Encounter (Signed)
Please call patient and schedule her with Raliegh Ip - today please. I have placed referral.  Appointment was to be set up by nurse when she called patient on 12/20/15. Note mentions referral but on review the nurse did not set this up. I was under the impression everything was complete.

## 2016-07-06 ENCOUNTER — Encounter (HOSPITAL_COMMUNITY): Payer: Self-pay | Admitting: *Deleted

## 2016-07-06 ENCOUNTER — Inpatient Hospital Stay (HOSPITAL_COMMUNITY)
Admission: AD | Admit: 2016-07-06 | Discharge: 2016-07-06 | Disposition: A | Discharge status: Home / Self Care | Payer: BLUE CROSS/BLUE SHIELD | Source: Ambulatory Visit | Attending: Obstetrics & Gynecology | Admitting: Obstetrics & Gynecology

## 2016-07-06 DIAGNOSIS — N760 Acute vaginitis: Secondary | ICD-10-CM

## 2016-07-06 DIAGNOSIS — B9689 Other specified bacterial agents as the cause of diseases classified elsewhere: Secondary | ICD-10-CM

## 2016-07-06 DIAGNOSIS — Z22322 Carrier or suspected carrier of Methicillin resistant Staphylococcus aureus: Secondary | ICD-10-CM

## 2016-07-06 DIAGNOSIS — B373 Candidiasis of vulva and vagina: Secondary | ICD-10-CM

## 2016-07-06 DIAGNOSIS — A499 Bacterial infection, unspecified: Secondary | ICD-10-CM

## 2016-07-06 DIAGNOSIS — B3731 Acute candidiasis of vulva and vagina: Secondary | ICD-10-CM

## 2016-07-06 DIAGNOSIS — N390 Urinary tract infection, site not specified: Secondary | ICD-10-CM | POA: Insufficient documentation

## 2016-07-06 DIAGNOSIS — N3000 Acute cystitis without hematuria: Secondary | ICD-10-CM

## 2016-07-06 HISTORY — DX: Carrier or suspected carrier of methicillin resistant Staphylococcus aureus: Z22.322

## 2016-07-06 LAB — WET PREP, GENITAL
Trich, Wet Prep: NONE SEEN
WBC, Wet Prep HPF POC: NONE SEEN
Yeast Wet Prep HPF POC: NONE SEEN

## 2016-07-06 LAB — URINALYSIS, ROUTINE W REFLEX MICROSCOPIC
Bilirubin Urine: NEGATIVE
Glucose, UA: 500 mg/dL — AB
Hgb urine dipstick: NEGATIVE
Ketones, ur: NEGATIVE mg/dL
Leukocytes, UA: NEGATIVE
Nitrite: POSITIVE — AB
Protein, ur: NEGATIVE mg/dL
Specific Gravity, Urine: >1.03 — ABNORMAL HIGH (ref 1.005–1.030)
pH: 6 (ref 5.0–8.0)

## 2016-07-06 LAB — URINE MICROSCOPIC-ADD ON

## 2016-07-06 MED ORDER — FLUCONAZOLE 150 MG PO TABS: 150.0000 mg | ORAL_TABLET | Freq: Once | ORAL | 0 refills | Status: AC

## 2016-07-06 MED ORDER — CEPHALEXIN 500 MG PO CAPS: 500.0000 mg | ORAL_CAPSULE | Freq: Two times a day (BID) | ORAL | 0 refills | Status: DC

## 2016-07-06 MED ORDER — METRONIDAZOLE 500 MG PO TABS: 500.0000 mg | ORAL_TABLET | Freq: Two times a day (BID) | ORAL | 0 refills | Status: DC

## 2016-07-06 NOTE — MAU Note (Signed)
Pt had shaving cream get into her vagina while shaving and it made her burn badly.  She also had some spotting she thinks from her urethra, right after sex.  She has burning still and and intermittent itching.

## 2016-07-06 NOTE — MAU Provider Note (Signed)
History     CSN: [id number]  Arrival date and time: 07/06/16 1245   None     Chief Complaint  Patient presents with  . Vaginal Bleeding   Non-pregnant female c/o vaginal irritation and burning x2 weeks. Prior to onset she used a new "sex toy" and had also shaved the vagina using shaving cream. She denies abnormal vaginal discharge. No new partners. She used yeast cream externally w/o relief although she reports use of coconut externally has helped. She also c/o spotting from urethra since this am, she denies vaginal bleeding. She denies urinary sx.   Past Medical History:  Diagnosis Date  . ADHD (attention deficit hyperactivity disorder)   . ALLERGIC RHINITIS 06/26/2008  . ANEMIA-IRON DEFICIENCY 06/26/2008  . 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  . ANKLE PAIN, LEFT 06/26/2008  . BACK PAIN, LUMBAR 10/15/2007  . BIPOLAR DISORDER UNSPECIFIED 09/13/2007  . CERVICAL RADICULOPATHY, LEFT 06/26/2008  . Cervicalgia 10/15/2007  . Chronic pain syndrome 03/21/2011  . COMMON MIGRAINE 06/26/2008  . DIABETES MELLITUS, TYPE II 06/05/2007  . DYSPNEA 12/05/2007  . EAR PAIN, RIGHT 12/05/2007  . ELEVATED BP READING WITHOUT DX HYPERTENSION 07/29/2009  . FATIGUE 12/05/2007  . FIBROMYALGIA 06/26/2008  . GASTROENTERITIS, ACUTE 12/15/2008  . GERD 06/26/2008  . Headache(784.0) 09/13/2007  . HYPERLIPIDEMIA 09/13/2007  . JOINT EFFUSION, RIGHT KNEE 07/29/2009  . KNEE PAIN, LEFT 11/21/2010  . LUMBAR RADICULOPATHY, LEFT 06/26/2008  . OTHER DISEASE OF PHARYNX OR NASOPHARYNX 07/03/2008  . PERIPHERAL EDEMA 01/06/2009  . POLYARTHRITIS 11/21/2010  . SHOULDER PAIN, BILATERAL 07/03/2008  . SINUSITIS- ACUTE-NOS 07/29/2009  . TACHYCARDIA 12/05/2007  . THYROID NODULE, RIGHT 11/20/2008  . TREMOR 01/02/2008  . Vertigo     Past Surgical History:  Procedure Laterality Date  . ABDOMINAL HYSTERECTOMY     fibroids  . back surgury     lumbar 2001  . CESAREAN SECTION     x 3  . thyroid fine needle aspiration  May 2008   showed non neoplastic goiter     Family History  Problem Relation Age of Onset  . Thyroid disease Mother   . Diabetes Mother   . Asthma Mother   . Hypertension Father   . Hyperlipidemia Father   . Diabetes Father   . Emphysema Other   . Coronary artery disease Other   . Cancer Other     pancreatic and breast cancer  . Cancer Other     lung and prostate cancer    Social History  Substance Use Topics  . Smoking status: Never Smoker  . Smokeless tobacco: Never Used  . Alcohol use No    Allergies:  Allergies  Allergen Reactions  . Sulfa Antibiotics Anaphylaxis  . Contrast Media [Iodinated Diagnostic Agents] Itching    Mri, severe heaving ~ can take benadryl without reaction  . Promethazine Hcl     Iv dose with benadryl causes severe aggrivation  . Tdap [Diphth-Acell Pertussis-Tetanus] Hives, Itching, Swelling, Anxiety and Rash    Prescriptions Prior to Admission  Medication Sig Dispense Refill Last Dose  . cyclobenzaprine (FLEXERIL) 10 MG tablet Take 1 tablet (10 mg total) by mouth at bedtime. 30 tablet 0   . HYDROcodone-acetaminophen (NORCO) 10-325 MG tablet Take 1 tablet by mouth every 8 (eight) hours as needed. 30 tablet 0   . meloxicam (MOBIC) 15 MG tablet Take 1 tablet (15 mg total) by mouth daily as needed for pain. 30 tablet 0 Taking  . Probiotic Product (PROBIOTIC PO) Take 1 tablet by mouth daily.  Taking    Review of Systems  Constitutional: Negative.   Gastrointestinal: Negative.   Genitourinary: Negative.    Physical Exam   Blood pressure 118/88, pulse 110, temperature 97.9 F (36.6 C), temperature source Oral, resp. rate 17.  Physical Exam  Constitutional: She appears well-developed and well-nourished.  HENT:  Head: Normocephalic.  Neck: Normal range of motion.  Cardiovascular: Normal rate.   Respiratory: Effort normal.  Genitourinary:  Genitourinary Comments: External: no lesions Vagina: rugated,  Thick curdy discharge, mild erythema at introitus    Musculoskeletal:  Normal range of motion.  Neurological: She is alert.  Skin: Skin is warm and dry.  Psychiatric: She has a normal mood and affect.   Results for orders placed or performed during the hospital encounter of 07/06/16 (from the past 24 hour(s))  Urinalysis, Routine w reflex microscopic (not at [facility])     Status: Abnormal   Collection Time: 07/06/16  1:10 PM  Result Value Ref Range   Color, Urine YELLOW YELLOW   APPearance CLOUDY (A) CLEAR   Specific Gravity, Urine >1.030 (H) 1.005 - 1.030   pH 6.0 5.0 - 8.0   Glucose, UA 500 (A) NEGATIVE mg/dL   Hgb urine dipstick NEGATIVE NEGATIVE   Bilirubin Urine NEGATIVE NEGATIVE   Ketones, ur NEGATIVE NEGATIVE mg/dL   Protein, ur NEGATIVE NEGATIVE mg/dL   Nitrite POSITIVE (A) NEGATIVE   Leukocytes, UA NEGATIVE NEGATIVE  Urine microscopic-add on     Status: Abnormal   Collection Time: 07/06/16  1:10 PM  Result Value Ref Range   Squamous Epithelial / LPF 0-5 (A) NONE SEEN   WBC, UA 0-5 0 - 5 WBC/hpf   RBC / HPF 0-5 0 - 5 RBC/hpf   Bacteria, UA MANY (A) NONE SEEN   Urine-Other MUCOUS PRESENT   Wet prep, genital     Status: Abnormal   Collection Time: 07/06/16  2:10 PM  Result Value Ref Range   Yeast Wet Prep HPF POC NONE SEEN NONE SEEN   Trich, Wet Prep NONE SEEN NONE SEEN   Clue Cells Wet Prep HPF POC PRESENT (A) NONE SEEN   WBC, Wet Prep HPF POC NONE SEEN NONE SEEN   Sperm PRESENT     MAU Course  Procedures  MDM Labs ordered and reviewed. No evidence of acute abdominal or pelvic process. Stable for discharge home.  Assessment and Plan   1. Bacterial vaginosis   2. Yeast vaginitis   3.      UTI Discharge home  Tubs soaks with baking soda bid   Medication List    TAKE these medications   ALEVE-D SINUS & COLD 120-220 MG Tb12 Generic drug:  Pseudoephedrine-Naproxen Na Take 1 tablet by mouth as needed.   cephALEXin 500 MG capsule Commonly known as:  KEFLEX Take 1 capsule (500 mg total) by mouth 2 (two) times daily.    fluconazole 150 MG tablet Commonly known as:  DIFLUCAN Take 1 tablet (150 mg total) by mouth once.   meloxicam 15 MG tablet Commonly known as:  MOBIC Take 1 tablet (15 mg total) by mouth daily as needed for pain.   metroNIDAZOLE 500 MG tablet Commonly known as:  FLAGYL Take 1 tablet (500 mg total) by mouth 2 (two) times daily.   PROBIOTIC PO Take 1 tablet by mouth as needed.      Julianne Handler, CNM 07/06/2016, 1:59 PM

## 2016-07-06 NOTE — Discharge Instructions (Signed)
Bacterial Vaginosis Bacterial vaginosis is a vaginal infection that occurs when the normal balance of bacteria in the vagina is disrupted. It results from an overgrowth of certain bacteria. This is the most common vaginal infection in women of childbearing age. Treatment is important to prevent complications, especially in pregnant women, as it can cause a premature delivery. CAUSES  Bacterial vaginosis is caused by an increase in harmful bacteria that are normally present in smaller amounts in the vagina. Several different kinds of bacteria can cause bacterial vaginosis. However, the reason that the condition develops is not fully understood. RISK FACTORS Certain activities or behaviors can put you at an increased risk of developing bacterial vaginosis, including:  Having a new sex partner or multiple sex partners.  Douching.  Using an intrauterine device (IUD) for contraception. Women do not get bacterial vaginosis from toilet seats, bedding, swimming pools, or contact with objects around them. SIGNS AND SYMPTOMS  Some women with bacterial vaginosis have no signs or symptoms. Common symptoms include:  Grey vaginal discharge.  A fishlike odor with discharge, especially after sexual intercourse.  Itching or burning of the vagina and vulva.  Burning or pain with urination. DIAGNOSIS  Your health care provider will take a medical history and examine the vagina for signs of bacterial vaginosis. A sample of vaginal fluid may be taken. Your health care provider will look at this sample under a microscope to check for bacteria and abnormal cells. A vaginal pH test may also be done.  TREATMENT  Bacterial vaginosis may be treated with antibiotic medicines. These may be given in the form of a pill or a vaginal cream. A second round of antibiotics may be prescribed if the condition comes back after treatment. Because bacterial vaginosis increases your risk for sexually transmitted diseases, getting  treated can help reduce your risk for chlamydia, gonorrhea, HIV, and herpes. HOME CARE INSTRUCTIONS   Only take over-the-counter or prescription medicines as directed by your health care provider.  If antibiotic medicine was prescribed, take it as directed. Make sure you finish it even if you start to feel better.  Tell all sexual partners that you have a vaginal infection. They should see their health care provider and be treated if they have problems, such as a mild rash or itching.  During treatment, it is important that you follow these instructions:  Avoid sexual activity or use condoms correctly.  Do not douche.  Avoid alcohol as directed by your health care provider.  Avoid breastfeeding as directed by your health care provider. SEEK MEDICAL CARE IF:   Your symptoms are not improving after 3 days of treatment.  You have increased discharge or pain.  You have a fever. MAKE SURE YOU:   Understand these instructions.  Will watch your condition.  Will get help right away if you are not doing well or get worse. FOR MORE INFORMATION  Centers for Disease Control and Prevention, Division of STD Prevention: www.cdc.gov/std American Sexual Health Association (ASHA): www.ashastd.org    This information is not intended to replace advice given to you by your health care provider. Make sure you discuss any questions you have with your health care provider.   Document Released: 10/09/2005 Document Revised: 10/30/2014 Document Reviewed: 05/21/2013 Elsevier Interactive Patient Education 2016 Elsevier Inc. Monilial Vaginitis Vaginitis in a soreness, swelling and redness (inflammation) of the vagina and vulva. Monilial vaginitis is not a sexually transmitted infection. CAUSES  Yeast vaginitis is caused by yeast (candida) that is normally found in   your vagina. With a yeast infection, the candida has overgrown in number to a point that upsets the chemical balance. SYMPTOMS   White,  thick vaginal discharge.  Swelling, itching, redness and irritation of the vagina and possibly the lips of the vagina (vulva).  Burning or painful urination.  Painful intercourse. DIAGNOSIS  Things that may contribute to monilial vaginitis are:  Postmenopausal and virginal states.  Pregnancy.  Infections.  Being tired, sick or stressed, especially if you had monilial vaginitis in the past.  Diabetes. Good control will help lower the chance.  Birth control pills.  Tight fitting garments.  Using bubble bath, feminine sprays, douches or deodorant tampons.  Taking certain medications that kill germs (antibiotics).  Sporadic recurrence can occur if you become ill. TREATMENT  Your caregiver will give you medication.  There are several kinds of anti monilial vaginal creams and suppositories specific for monilial vaginitis. For recurrent yeast infections, use a suppository or cream in the vagina 2 times a week, or as directed.  Anti-monilial or steroid cream for the itching or irritation of the vulva may also be used. Get your caregiver's permission.  Painting the vagina with methylene blue solution may help if the monilial cream does not work.  Eating yogurt may help prevent monilial vaginitis. HOME CARE INSTRUCTIONS   Finish all medication as prescribed.  Do not have sex until treatment is completed or after your caregiver tells you it is okay.  Take warm sitz baths.  Do not douche.  Do not use tampons, especially scented ones.  Wear cotton underwear.  Avoid tight pants and panty hose.  Tell your sexual partner that you have a yeast infection. They should go to their caregiver if they have symptoms such as mild rash or itching.  Your sexual partner should be treated as well if your infection is difficult to eliminate.  Practice safer sex. Use condoms.  Some vaginal medications cause latex condoms to fail. Vaginal medications that harm condoms are:  Cleocin  cream.  Butoconazole (Femstat).  Terconazole (Terazol) vaginal suppository.  Miconazole (Monistat) (may be purchased over the counter). SEEK MEDICAL CARE IF:   You have a temperature by mouth above 102 F (38.9 C).  The infection is getting worse after 2 days of treatment.  The infection is not getting better after 3 days of treatment.  You develop blisters in or around your vagina.  You develop vaginal bleeding, and it is not your menstrual period.  You have pain when you urinate.  You develop intestinal problems.  You have pain with sexual intercourse.   This information is not intended to replace advice given to you by your health care provider. Make sure you discuss any questions you have with your health care provider.   Document Released: 07/19/2005 Document Revised: 01/01/2012 Document Reviewed: 04/12/2015 Elsevier Interactive Patient Education 2016 Elsevier Inc.  

## 2016-07-07 LAB — GC/CHLAMYDIA PROBE AMP (~~LOC~~) NOT AT ARMC
Chlamydia: NEGATIVE
Neisseria Gonorrhea: NEGATIVE

## 2016-07-08 ENCOUNTER — Encounter: Payer: Self-pay | Admitting: Obstetrics & Gynecology

## 2016-07-08 ENCOUNTER — Other Ambulatory Visit: Payer: Self-pay | Admitting: Obstetrics & Gynecology

## 2016-07-08 DIAGNOSIS — Z22322 Carrier or suspected carrier of Methicillin resistant Staphylococcus aureus: Secondary | ICD-10-CM

## 2016-07-08 LAB — URINE CULTURE: Culture: >=100000 — AB

## 2016-07-08 MED ORDER — DOXYCYCLINE HYCLATE 100 MG PO CAPS: 100.0000 mg | ORAL_CAPSULE | Freq: Two times a day (BID) | ORAL | 0 refills | Status: DC

## 2016-07-08 NOTE — Progress Notes (Signed)
Urine culture is MRSA positive. Doxycycline 100 mg bid x 7 days prescribed as this is sensitive to tetracyclines. Patient was called, there was a generic voicemail message, and message was left telling patient to check her MyChart for recommendations as below.  Osborne Oman, MD

## 2016-08-06 ENCOUNTER — Other Ambulatory Visit: Payer: Self-pay | Admitting: Obstetrics & Gynecology

## 2016-08-06 ENCOUNTER — Other Ambulatory Visit (HOSPITAL_COMMUNITY): Payer: Self-pay | Admitting: Certified Nurse Midwife

## 2016-08-06 DIAGNOSIS — Z22322 Carrier or suspected carrier of Methicillin resistant Staphylococcus aureus: Secondary | ICD-10-CM

## 2016-08-17 ENCOUNTER — Encounter (HOSPITAL_COMMUNITY): Payer: Self-pay

## 2016-08-17 ENCOUNTER — Inpatient Hospital Stay (HOSPITAL_COMMUNITY)
Admission: AD | Admit: 2016-08-17 | Discharge: 2016-08-17 | Disposition: A | Discharge status: Home / Self Care | Payer: BLUE CROSS/BLUE SHIELD | Source: Ambulatory Visit | Attending: Obstetrics and Gynecology | Admitting: Obstetrics and Gynecology

## 2016-08-17 DIAGNOSIS — B373 Candidiasis of vulva and vagina: Secondary | ICD-10-CM | POA: Insufficient documentation

## 2016-08-17 DIAGNOSIS — B3731 Acute candidiasis of vulva and vagina: Secondary | ICD-10-CM

## 2016-08-17 LAB — URINALYSIS, ROUTINE W REFLEX MICROSCOPIC
Bilirubin Urine: NEGATIVE
Glucose, UA: >1000 mg/dL — AB
Hgb urine dipstick: NEGATIVE
Ketones, ur: NEGATIVE mg/dL
Leukocytes, UA: NEGATIVE
Nitrite: NEGATIVE
Protein, ur: NEGATIVE mg/dL
Specific Gravity, Urine: 1.015 (ref 1.005–1.030)
pH: 5.5 (ref 5.0–8.0)

## 2016-08-17 LAB — WET PREP, GENITAL
Clue Cells Wet Prep HPF POC: NONE SEEN
Sperm: NONE SEEN
Trich, Wet Prep: NONE SEEN

## 2016-08-17 LAB — URINE MICROSCOPIC-ADD ON: WBC, UA: NONE SEEN WBC/hpf (ref 0–5)

## 2016-08-17 MED ORDER — FLUCONAZOLE 150 MG PO TABS: 150.0000 mg | ORAL_TABLET | Freq: Once | ORAL | 0 refills | Status: AC

## 2016-08-17 MED ORDER — NYSTATIN-TRIAMCINOLONE 100000-0.1 UNIT/GM-% EX CREA
TOPICAL_CREAM | CUTANEOUS | 0 refills | Status: DC
Start: 2016-08-17 — End: 2016-08-31

## 2016-08-17 NOTE — MAU Note (Signed)
Pt was treated for BV, UTI and yeast a few weeks ago. Pt repots she is having worse vaginal irritation and burning now. Stated she usually getw a yeast infection after antibiotic. SHe is very uncomfortable

## 2016-08-17 NOTE — Discharge Instructions (Signed)

## 2016-08-17 NOTE — MAU Provider Note (Signed)
History     CSN: [id number]  Arrival date and time: 08/17/16 [id number]   First Provider Initiated Contact with Patient 08/17/16 2008      Chief Complaint  Patient presents with  . Vaginal Discharge   Shirley [name] is a 40 y.o. [phone] who presents today with vaginal discharge. She states that she was recently treated for UTI, and she usually gets yeast infection after antibiotics.    Vaginal Discharge  The patient's primary symptoms include vaginal discharge. This is a new problem. The current episode started 1 to 4 weeks ago. The problem occurs constantly. The problem has been gradually worsening. The problem affects both sides. She is not pregnant. Associated symptoms include dysuria. Pertinent negatives include no abdominal pain, chills, constipation, diarrhea, fever, frequency, nausea, urgency or vomiting. The vaginal discharge was thick and white. The vaginal bleeding is spotting. Nothing aggravates the symptoms. Treatments tried: probiotics, baking soda bath, vinegar water. The treatment provided no relief.    Past Medical History:  Diagnosis Date  . ADHD (attention deficit hyperactivity disorder)   . ALLERGIC RHINITIS 06/26/2008  . ANEMIA-IRON DEFICIENCY 06/26/2008  . 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  . ANKLE PAIN, LEFT 06/26/2008  . BACK PAIN, LUMBAR 10/15/2007  . BIPOLAR DISORDER UNSPECIFIED 09/13/2007  . CERVICAL RADICULOPATHY, LEFT 06/26/2008  . Cervicalgia 10/15/2007  . Chronic pain syndrome 03/21/2011  . COMMON MIGRAINE 06/26/2008  . DIABETES MELLITUS, TYPE II 06/05/2007  . DYSPNEA 12/05/2007  . EAR PAIN, RIGHT 12/05/2007  . ELEVATED BP READING WITHOUT DX HYPERTENSION 07/29/2009  . FATIGUE 12/05/2007  . FIBROMYALGIA 06/26/2008  . GASTROENTERITIS, ACUTE 12/15/2008  . GERD 06/26/2008  . Headache(784.0) 09/13/2007  . HYPERLIPIDEMIA 09/13/2007  . JOINT EFFUSION, RIGHT KNEE 07/29/2009  . KNEE PAIN, LEFT 11/21/2010  . LUMBAR RADICULOPATHY, LEFT 06/26/2008  . OTHER DISEASE OF PHARYNX OR NASOPHARYNX  07/03/2008  . PERIPHERAL EDEMA 01/06/2009  . POLYARTHRITIS 11/21/2010  . SHOULDER PAIN, BILATERAL 07/03/2008  . SINUSITIS- ACUTE-NOS 07/29/2009  . TACHYCARDIA 12/05/2007  . THYROID NODULE, RIGHT 11/20/2008  . TREMOR 01/02/2008  . Vertigo     Past Surgical History:  Procedure Laterality Date  . ABDOMINAL HYSTERECTOMY     fibroids  . back surgury     lumbar 2001  . CESAREAN SECTION     x 3  . thyroid fine needle aspiration  May 2008   showed non neoplastic goiter    Family History  Problem Relation Age of Onset  . Thyroid disease Mother   . Diabetes Mother   . Asthma Mother   . Hypertension Father   . Hyperlipidemia Father   . Diabetes Father   . Emphysema Other   . Coronary artery disease Other   . Cancer Other     pancreatic and breast cancer  . Cancer Other     lung and prostate cancer    Social History  Substance Use Topics  . Smoking status: Never Smoker  . Smokeless tobacco: Never Used  . Alcohol use No    Allergies:  Allergies  Allergen Reactions  . Sulfa Antibiotics Anaphylaxis  . Contrast Media [Iodinated Diagnostic Agents] Itching    Mri, severe heaving ~ can take benadryl without reaction  . Promethazine Hcl     Iv dose with benadryl causes severe aggrivation  . Tdap [Diphth-Acell Pertussis-Tetanus] Hives, Itching, Swelling, Anxiety and Rash    Prescriptions Prior to Admission  Medication Sig Dispense Refill Last Dose  . calcium-vitamin D (OSCAL WITH D) 500-200 MG-UNIT tablet Take 1 tablet by  mouth.   08/17/2016 at Unknown time  . doxycycline (VIBRAMYCIN) 100 MG capsule Take 1 capsule (100 mg total) by mouth 2 (two) times daily. 14 capsule 0 Past Week at Unknown time  . metroNIDAZOLE (FLAGYL) 500 MG tablet Take 1 tablet (500 mg total) by mouth 2 (two) times daily. 14 tablet 0 Past Week at Unknown time  . Multiple Vitamins-Minerals (MULTIVITAMIN WITH MINERALS) tablet Take 1 tablet by mouth daily.   08/17/2016 at Unknown time  . Probiotic Product  (PROBIOTIC PO) Take 1 tablet by mouth as needed.    08/17/2016 at Unknown time  . meloxicam (MOBIC) 15 MG tablet Take 1 tablet (15 mg total) by mouth daily as needed for pain. 30 tablet 0 Past Month at Unknown time  . Pseudoephedrine-Naproxen Na (ALEVE-D SINUS & COLD) 120-220 MG TB12 Take 1 tablet by mouth as needed.    Past Month at Unknown time    Review of Systems  Constitutional: Negative for chills and fever.  Gastrointestinal: Negative for abdominal pain, constipation, diarrhea, nausea and vomiting.  Genitourinary: Positive for dysuria and vaginal discharge. Negative for frequency and urgency.   Physical Exam   Blood pressure 131/90, pulse 103, temperature 98.3 F (36.8 C), temperature source Oral, resp. rate 18, height 5\' 5"  (1.651 m), weight 203 lb 1.9 oz (92.1 kg).  Physical Exam  Nursing note and vitals reviewed. Constitutional: She is oriented to person, place, and time. She appears well-developed and well-nourished. No distress.  HENT:  Head: Normocephalic.  Cardiovascular: Normal rate.   Respiratory: Effort normal.  GI: Soft. There is no tenderness. There is no rebound.  Genitourinary:  Genitourinary Comments: Large amount of thick, white adherent discharge at the introitus.   Neurological: She is alert and oriented to person, place, and time.  Skin: Skin is warm and dry.  Psychiatric: She has a normal mood and affect.    MAU Course  Procedures  MDM  Assessment and Plan   1. Yeast infection involving the vagina and surrounding area    DC home Comfort measures reviewed  RX: diflucan as directed, mycolog TID  Return to MAU as needed  Follow-up [facility] .   Contact information: [place] [address] [phone]            Mathis Bud 08/17/2016, 8:10 PM

## 2016-08-18 LAB — GC/CHLAMYDIA PROBE AMP (~~LOC~~) NOT AT ARMC
Chlamydia: NEGATIVE
Neisseria Gonorrhea: NEGATIVE

## 2016-08-31 ENCOUNTER — Encounter (HOSPITAL_BASED_OUTPATIENT_CLINIC_OR_DEPARTMENT_OTHER): Payer: Self-pay

## 2016-08-31 ENCOUNTER — Emergency Department (HOSPITAL_BASED_OUTPATIENT_CLINIC_OR_DEPARTMENT_OTHER)
Admission: EM | Admit: 2016-08-31 | Discharge: 2016-08-31 | Disposition: A | Discharge status: Home / Self Care | Payer: BLUE CROSS/BLUE SHIELD | Attending: Emergency Medicine | Admitting: Emergency Medicine

## 2016-08-31 DIAGNOSIS — F909 Attention-deficit hyperactivity disorder, unspecified type: Secondary | ICD-10-CM | POA: Insufficient documentation

## 2016-08-31 DIAGNOSIS — J0100 Acute maxillary sinusitis, unspecified: Secondary | ICD-10-CM | POA: Insufficient documentation

## 2016-08-31 DIAGNOSIS — E119 Type 2 diabetes mellitus without complications: Secondary | ICD-10-CM | POA: Insufficient documentation

## 2016-08-31 MED ORDER — IBUPROFEN 800 MG PO TABS: 800.0000 mg | ORAL_TABLET | Freq: Three times a day (TID) | ORAL | 0 refills | Status: DC

## 2016-08-31 MED ORDER — FLUTICASONE PROPIONATE 50 MCG/ACT NA SUSP: 2.0000 | Freq: Every day | NASAL | 0 refills | Status: DC

## 2016-08-31 MED ORDER — FLUCONAZOLE 150 MG PO TABS: 150.0000 mg | ORAL_TABLET | Freq: Once | ORAL | 0 refills | Status: AC

## 2016-08-31 MED ORDER — CETIRIZINE HCL 10 MG PO TABS: 10.0000 mg | ORAL_TABLET | Freq: Every day | ORAL | 0 refills | Status: DC

## 2016-08-31 MED ORDER — AMOXICILLIN-POT CLAVULANATE 875-125 MG PO TABS: 1.0000 | ORAL_TABLET | Freq: Two times a day (BID) | ORAL | 0 refills | Status: DC

## 2016-08-31 MED ORDER — IBUPROFEN 800 MG PO TABS
800.0000 mg | ORAL_TABLET | Freq: Once | ORAL | Status: AC
  Administered 2016-08-31: 800 mg via ORAL
  Filled 2016-08-31: qty 1

## 2016-08-31 MED FILL — FLUCONAZOLE 150 MG TABLET: 150 | 1 days supply | Qty: 1 | Fill #0

## 2016-08-31 MED FILL — AMOX-CLAV 875-125 MG TABLET: 875-125 | 7 days supply | Qty: 14 | Fill #0

## 2016-08-31 MED FILL — SM ALLERGY RELIEF 50 MCG SP: 50 MCG | 30 days supply | Qty: 16 | Fill #0

## 2016-08-31 MED FILL — IBUPROFEN 800 MG TABLET: 800 | 7 days supply | Qty: 21 | Fill #0

## 2016-08-31 MED FILL — ALL DAY ALLERGY 10 MG TAB: 10 | 100 days supply | Qty: 100 | Fill #0

## 2016-08-31 NOTE — Discharge Instructions (Signed)
Start taking zyrtec and using flonase daily.  You may take 800 mg Ibuprofen for pain.  If your symptoms have not improved in 2-3 days.  Start taking Augmentin twice daily for possible bacterial cause.  Return to the ED if you experience visual changes, severe headache, pain with eye movement, fever, or any new or concerning symptoms.  If you develop a yeast infection, you may take Diflucan.

## 2016-08-31 NOTE — ED Provider Notes (Signed)
[facility] Provider Note   CSN: [id number] Arrival date & time: 08/31/16  1112     History   Chief Complaint Chief Complaint  Patient presents with  . Nasal Congestion    HPI Shirley [name] is a 40 y.o. female.  HPI Patient presents with gradual onset, constant, unchanging nasal congestion with associated facial pain. No fever, chills, visual disturbances, pain with eye movement, neck stiffness, sore throat, cough. She has been taking sinus equate with some relief. Worse bending over.  Patient asking for Diflucan if prescribed antibiotics because she states she gets yeast infections every time she takes antibiotics.   Past Medical History:  Diagnosis Date  . ADHD (attention deficit hyperactivity disorder)   . ALLERGIC RHINITIS 06/26/2008  . ANEMIA-IRON DEFICIENCY 06/26/2008  . 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  . ANKLE PAIN, LEFT 06/26/2008  . BACK PAIN, LUMBAR 10/15/2007  . BIPOLAR DISORDER UNSPECIFIED 09/13/2007  . CERVICAL RADICULOPATHY, LEFT 06/26/2008  . Cervicalgia 10/15/2007  . Chronic pain syndrome 03/21/2011  . COMMON MIGRAINE 06/26/2008  . DIABETES MELLITUS, TYPE II 06/05/2007  . DYSPNEA 12/05/2007  . EAR PAIN, RIGHT 12/05/2007  . ELEVATED BP READING WITHOUT DX HYPERTENSION 07/29/2009  . FATIGUE 12/05/2007  . FIBROMYALGIA 06/26/2008  . GASTROENTERITIS, ACUTE 12/15/2008  . GERD 06/26/2008  . Headache(784.0) 09/13/2007  . HYPERLIPIDEMIA 09/13/2007  . JOINT EFFUSION, RIGHT KNEE 07/29/2009  . KNEE PAIN, LEFT 11/21/2010  . LUMBAR RADICULOPATHY, LEFT 06/26/2008  . OTHER DISEASE OF PHARYNX OR NASOPHARYNX 07/03/2008  . PERIPHERAL EDEMA 01/06/2009  . POLYARTHRITIS 11/21/2010  . SHOULDER PAIN, BILATERAL 07/03/2008  . SINUSITIS- ACUTE-NOS 07/29/2009  . TACHYCARDIA 12/05/2007  . THYROID NODULE, RIGHT 11/20/2008  . TREMOR 01/02/2008  . Vertigo     Patient Active Problem List   Diagnosis Date Noted  . MRSA (methicillin resistant staph aureus) urine culture positive on 07/06/16 07/06/2016    . Foot injury 12/20/2015  . Bilateral thoracic back pain 12/20/2015  . Lower back pain 12/01/2015  . Left shoulder pain 11/07/2013  . Left flank pain 11/07/2013  . Encounter for long-term (current) use of high-risk medication 05/29/2011  . Preventative health care 05/28/2011  . Chronic pain syndrome 03/21/2011  . KNEE PAIN, LEFT 11/21/2010  . POLYARTHRITIS 11/21/2010  . BACK PAIN 11/22/2009  . JOINT EFFUSION, RIGHT KNEE 07/29/2009  . ELEVATED BP READING WITHOUT DX HYPERTENSION 07/29/2009  . 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  . PERIPHERAL EDEMA 01/06/2009  . GASTROENTERITIS, ACUTE 12/15/2008  . DYSPHAGIA UNSPECIFIED 11/23/2008  . THYROID NODULE, RIGHT 11/20/2008  . SHOULDER PAIN, BILATERAL 07/03/2008  . ANEMIA-IRON DEFICIENCY 06/26/2008  . COMMON MIGRAINE 06/26/2008  . ALLERGIC RHINITIS 06/26/2008  . GERD 06/26/2008  . ANKLE PAIN, LEFT 06/26/2008  . CERVICAL RADICULOPATHY, LEFT 06/26/2008  . LUMBAR RADICULOPATHY, LEFT 06/26/2008  . FIBROMYALGIA 06/26/2008  . TREMOR 01/02/2008  . EAR PAIN, RIGHT 12/05/2007  . FATIGUE 12/05/2007  . TACHYCARDIA 12/05/2007  . DYSPNEA 12/05/2007  . Cervicalgia 10/15/2007  . BACK PAIN, LUMBAR 10/15/2007  . Pain in Soft Tissues of Limb 10/15/2007  . Hyperlipidemia 09/13/2007  . BIPOLAR DISORDER UNSPECIFIED 09/13/2007  . Headache(784.0) 09/13/2007  . Diabetes ([place]) 06/05/2007    Past Surgical History:  Procedure Laterality Date  . ABDOMINAL HYSTERECTOMY     fibroids  . back surgury     lumbar 2001  . CESAREAN SECTION     x 3  . thyroid fine needle aspiration  May 2008   showed non neoplastic goiter    OB History    [place]  Para Term Preterm AB Living   3 3 2 1   3    SAB TAB Ectopic Multiple Live Births           3       Home Medications    Prior to Admission medications   Medication Sig Start Date End Date Taking? Authorizing Provider  amoxicillin-clavulanate (AUGMENTIN) 875-125 MG tablet Take 1 tablet by mouth 2 (two) times daily.  09/04/16   Gloriann Loan, PA-C  calcium-vitamin D (OSCAL WITH D) 500-200 MG-UNIT tablet Take 1 tablet by mouth.    Historical Provider, MD  cetirizine (ZYRTEC) 10 MG tablet Take 1 tablet (10 mg total) by mouth daily. 08/31/16   Gloriann Loan, PA-C  fluconazole (DIFLUCAN) 150 MG tablet Take 1 tablet (150 mg total) by mouth once. 08/31/16 08/31/16  Errin Chewning, PA-C  fluticasone (FLONASE) 50 MCG/ACT nasal spray Place 2 sprays into both nostrils daily. 08/31/16   Gloriann Loan, PA-C  ibuprofen (ADVIL,MOTRIN) 800 MG tablet Take 1 tablet (800 mg total) by mouth 3 (three) times daily. 08/31/16   Gloriann Loan, PA-C  Multiple Vitamins-Minerals (MULTIVITAMIN WITH MINERALS) tablet Take 1 tablet by mouth daily.    Historical Provider, MD  Probiotic Product (PROBIOTIC PO) Take 1 tablet by mouth as needed.     Historical Provider, MD  Pseudoephedrine-Naproxen Na (ALEVE-D SINUS & COLD) 120-220 MG TB12 Take 1 tablet by mouth as needed.     Historical Provider, MD    Family History Family History  Problem Relation Age of Onset  . Thyroid disease Mother   . Diabetes Mother   . Asthma Mother   . Hypertension Father   . Hyperlipidemia Father   . Diabetes Father   . Emphysema Other   . Coronary artery disease Other   . Cancer Other     pancreatic and breast cancer  . Cancer Other     lung and prostate cancer    Social History Social History  Substance Use Topics  . Smoking status: Never Smoker  . Smokeless tobacco: Never Used  . Alcohol use No     Allergies   Sulfa antibiotics; Contrast media [iodinated diagnostic agents]; Promethazine hcl; and Tdap [diphth-acell pertussis-tetanus]   Review of Systems Review of Systems All other systems negative unless otherwise stated in HPI   Physical Exam Updated Vital Signs BP 140/95 (BP Location: Left Arm)   Pulse 92   Temp 98.3 F (36.8 C) (Oral)   Resp 18   Ht 5\' 5"  (1.651 m)   Wt 92.1 kg   SpO2 100%   BMI 33.78 kg/m   Physical Exam  Constitutional: She  is oriented to person, place, and time. She appears well-developed and well-nourished.  Non-toxic appearance. She does not have a sickly appearance. She does not appear ill.  HENT:  Head: Normocephalic and atraumatic.  Right Ear: Tympanic membrane normal.  Left Ear: Tympanic membrane normal.  Nose: Right sinus exhibits maxillary sinus tenderness. Left sinus exhibits maxillary sinus tenderness.  Mouth/Throat: Uvula is midline, oropharynx is clear and moist and mucous membranes are normal. No oropharyngeal exudate, posterior oropharyngeal edema, posterior oropharyngeal erythema or tonsillar abscesses.  Eyes: Conjunctivae and EOM are normal.  No pain with eye movement.   Neck: Normal range of motion. Neck supple.  No nuchal rigidity.   Cardiovascular: Normal rate and regular rhythm.   Pulmonary/Chest: Effort normal and breath sounds normal. No accessory muscle usage or stridor. No respiratory distress. She has no wheezes. She has no rhonchi. She  has no rales.  Abdominal: Soft. Bowel sounds are normal. She exhibits no distension. There is no tenderness.  Musculoskeletal: Normal range of motion.  Lymphadenopathy:    She has no cervical adenopathy.  Neurological: She is alert and oriented to person, place, and time.  Speech clear without dysarthria.  Skin: Skin is warm and dry.  Psychiatric: She has a normal mood and affect. Her behavior is normal.     ED Treatments / Results  Labs (all labs ordered are listed, but only abnormal results are displayed) Labs Reviewed - No data to display  EKG  EKG Interpretation None       Radiology No results found.  Procedures Procedures (including critical care time)  Medications Ordered in ED Medications  ibuprofen (ADVIL,MOTRIN) tablet 800 mg (800 mg Oral Given 08/31/16 1156)     Initial Impression / Assessment and Plan / ED Course  I have reviewed the triage vital signs and the nursing notes.  Pertinent labs & imaging results that were  available during my care of the patient were reviewed by me and considered in my medical decision making (see chart for details).  Clinical Course    Patient complaining of symptoms of sinusitis.    Mild to moderate symptoms of clear/yellow nasal discharge/congestion and scratchy throat with cough for less than 10 days.  Patient is afebrile.  No concern for acute bacterial rhinosinusitis; likely viral in nature.  She does not have a PCP.  Prescription for Augmentin given, and instructed patient to start it in 2-3 days if symptoms are not improved with flonase and zyrtec. Patient instructions given for warm saline nasal washes.  Return precautions discussed.  Stable for discharge.   Final Clinical Impressions(s) / ED Diagnoses   Final diagnoses:  Acute non-recurrent maxillary sinusitis    New Prescriptions New Prescriptions   AMOXICILLIN-CLAVULANATE (AUGMENTIN) 875-125 MG TABLET    Take 1 tablet by mouth 2 (two) times daily.   CETIRIZINE (ZYRTEC) 10 MG TABLET    Take 1 tablet (10 mg total) by mouth daily.   FLUCONAZOLE (DIFLUCAN) 150 MG TABLET    Take 1 tablet (150 mg total) by mouth once.   FLUTICASONE (FLONASE) 50 MCG/ACT NASAL SPRAY    Place 2 sprays into both nostrils daily.   IBUPROFEN (ADVIL,MOTRIN) 800 MG TABLET    Take 1 tablet (800 mg total) by mouth 3 (three) times daily.     Gloriann Loan, PA-C 08/31/16 [place], MD 08/31/16 2129

## 2016-08-31 NOTE — ED Triage Notes (Signed)
C/o head congestion and facial pain x 1 week-NAD-steady gait

## 2016-08-31 NOTE — ED Notes (Signed)
Peanut butter and crackers, Sprite provided to take with ibuprofen; pt has not eaten since early this morning.

## 2016-10-18 ENCOUNTER — Emergency Department (HOSPITAL_BASED_OUTPATIENT_CLINIC_OR_DEPARTMENT_OTHER)
Admission: EM | Admit: 2016-10-18 | Discharge: 2016-10-18 | Discharge status: Against Medical Advice | Payer: BLUE CROSS/BLUE SHIELD | Attending: Emergency Medicine | Admitting: Emergency Medicine

## 2016-10-18 ENCOUNTER — Encounter (HOSPITAL_BASED_OUTPATIENT_CLINIC_OR_DEPARTMENT_OTHER): Payer: Self-pay | Admitting: *Deleted

## 2016-10-18 DIAGNOSIS — M542 Cervicalgia: Secondary | ICD-10-CM | POA: Insufficient documentation

## 2016-10-18 DIAGNOSIS — Z79899 Other long term (current) drug therapy: Secondary | ICD-10-CM | POA: Insufficient documentation

## 2016-10-18 DIAGNOSIS — R1084 Generalized abdominal pain: Secondary | ICD-10-CM

## 2016-10-18 DIAGNOSIS — R197 Diarrhea, unspecified: Secondary | ICD-10-CM | POA: Insufficient documentation

## 2016-10-18 DIAGNOSIS — R110 Nausea: Secondary | ICD-10-CM | POA: Insufficient documentation

## 2016-10-18 DIAGNOSIS — F909 Attention-deficit hyperactivity disorder, unspecified type: Secondary | ICD-10-CM | POA: Insufficient documentation

## 2016-10-18 DIAGNOSIS — Z791 Long term (current) use of non-steroidal anti-inflammatories (NSAID): Secondary | ICD-10-CM | POA: Insufficient documentation

## 2016-10-18 DIAGNOSIS — E119 Type 2 diabetes mellitus without complications: Secondary | ICD-10-CM | POA: Insufficient documentation

## 2016-10-18 LAB — URINALYSIS, ROUTINE W REFLEX MICROSCOPIC
Bilirubin Urine: NEGATIVE
Glucose, UA: >=500 mg/dL — AB
Hgb urine dipstick: NEGATIVE
Ketones, ur: NEGATIVE mg/dL
Leukocytes, UA: NEGATIVE
Nitrite: NEGATIVE
Protein, ur: NEGATIVE mg/dL
Specific Gravity, Urine: 1.028 (ref 1.005–1.030)
pH: 6 (ref 5.0–8.0)

## 2016-10-18 LAB — URINALYSIS, MICROSCOPIC (REFLEX)

## 2016-10-18 MED ORDER — SODIUM CHLORIDE 0.9 % IV BOLUS (SEPSIS): 1000.0000 mL | Freq: Once | INTRAVENOUS | Status: DC

## 2016-10-18 MED ORDER — ONDANSETRON HCL 4 MG/2ML IJ SOLN: 4.0000 mg | Freq: Once | INTRAMUSCULAR | Status: DC

## 2016-10-18 MED ORDER — DICYCLOMINE HCL 10 MG PO CAPS: 10.0000 mg | ORAL_CAPSULE | Freq: Once | ORAL | Status: DC

## 2016-10-18 NOTE — ED Provider Notes (Signed)
[facility] Provider Note   CSN: [id number] Arrival date & time: 10/18/16  1733  By signing my name below, I, Dora Sims, attest that this documentation has been prepared under the direction and in the presence of Will Briahna Pescador, PA-C. Electronically Signed: Dora Sims, Scribe. 10/18/2016. 7:30 PM.  History   Chief Complaint Chief Complaint  Patient presents with  . Abdominal Pain    The history is provided by the patient. No language interpreter was used.     HPI Comments: Shirley [name] is a 40 y.o. female who presents to the Emergency Department complaining of sudden onset, intermittent, abdominal pain beginning 9 days ago. She states her pain presents in random locations throughout her abdomen. She reports associated nausea, and diarrhea. She notes some abdominal bloating initially that has resolved. Pt reports that after the first day of onset, her symptoms improved for a few days but have worsened over the last several days. She states her last episode of diarrhea was yesterday evening. Pt reports she could not eat all of her [employer] this afternoon because of her abdominal pain and nausea. She notes some of her coworkers have recently had a "stomach bug." She notes a pertinent PSHx of abdominal hysterectomy and C-section. She also reports right neck pain for "a while." She denies fever, chills, hematochezia, dysuria, hematuria, CP, SOB, vomiting, or any other associated symptoms.  Past Medical History:  Diagnosis Date  . ADHD (attention deficit hyperactivity disorder)   . ALLERGIC RHINITIS 06/26/2008  . ANEMIA-IRON DEFICIENCY 06/26/2008  . 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  . ANKLE PAIN, LEFT 06/26/2008  . BACK PAIN, LUMBAR 10/15/2007  . BIPOLAR DISORDER UNSPECIFIED 09/13/2007  . CERVICAL RADICULOPATHY, LEFT 06/26/2008  . Cervicalgia 10/15/2007  . Chronic pain syndrome 03/21/2011  . COMMON MIGRAINE 06/26/2008  . DIABETES MELLITUS, TYPE II 06/05/2007  . DYSPNEA 12/05/2007  .  EAR PAIN, RIGHT 12/05/2007  . ELEVATED BP READING WITHOUT DX HYPERTENSION 07/29/2009  . FATIGUE 12/05/2007  . FIBROMYALGIA 06/26/2008  . GASTROENTERITIS, ACUTE 12/15/2008  . GERD 06/26/2008  . Headache(784.0) 09/13/2007  . HYPERLIPIDEMIA 09/13/2007  . JOINT EFFUSION, RIGHT KNEE 07/29/2009  . KNEE PAIN, LEFT 11/21/2010  . LUMBAR RADICULOPATHY, LEFT 06/26/2008  . OTHER DISEASE OF PHARYNX OR NASOPHARYNX 07/03/2008  . PERIPHERAL EDEMA 01/06/2009  . POLYARTHRITIS 11/21/2010  . SHOULDER PAIN, BILATERAL 07/03/2008  . SINUSITIS- ACUTE-NOS 07/29/2009  . TACHYCARDIA 12/05/2007  . THYROID NODULE, RIGHT 11/20/2008  . TREMOR 01/02/2008  . Vertigo     Patient Active Problem List   Diagnosis Date Noted  . MRSA (methicillin resistant staph aureus) urine culture positive on 07/06/16 07/06/2016  . Foot injury 12/20/2015  . Bilateral thoracic back pain 12/20/2015  . Lower back pain 12/01/2015  . Left shoulder pain 11/07/2013  . Left flank pain 11/07/2013  . Encounter for long-term (current) use of high-risk medication 05/29/2011  . Preventative health care 05/28/2011  . Chronic pain syndrome 03/21/2011  . KNEE PAIN, LEFT 11/21/2010  . POLYARTHRITIS 11/21/2010  . BACK PAIN 11/22/2009  . JOINT EFFUSION, RIGHT KNEE 07/29/2009  . ELEVATED BP READING WITHOUT DX HYPERTENSION 07/29/2009  . 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  . PERIPHERAL EDEMA 01/06/2009  . GASTROENTERITIS, ACUTE 12/15/2008  . DYSPHAGIA UNSPECIFIED 11/23/2008  . THYROID NODULE, RIGHT 11/20/2008  . SHOULDER PAIN, BILATERAL 07/03/2008  . ANEMIA-IRON DEFICIENCY 06/26/2008  . COMMON MIGRAINE 06/26/2008  . ALLERGIC RHINITIS 06/26/2008  . GERD 06/26/2008  . ANKLE PAIN, LEFT 06/26/2008  . CERVICAL RADICULOPATHY, LEFT 06/26/2008  . LUMBAR RADICULOPATHY, LEFT  06/26/2008  . FIBROMYALGIA 06/26/2008  . TREMOR 01/02/2008  . EAR PAIN, RIGHT 12/05/2007  . FATIGUE 12/05/2007  . TACHYCARDIA 12/05/2007  . DYSPNEA 12/05/2007  . Cervicalgia 10/15/2007  . BACK PAIN,  LUMBAR 10/15/2007  . Pain in Soft Tissues of Limb 10/15/2007  . Hyperlipidemia 09/13/2007  . BIPOLAR DISORDER UNSPECIFIED 09/13/2007  . Headache(784.0) 09/13/2007  . Diabetes ([place]) 06/05/2007    Past Surgical History:  Procedure Laterality Date  . ABDOMINAL HYSTERECTOMY     fibroids  . back surgury     lumbar 2001  . CESAREAN SECTION     x 3  . thyroid fine needle aspiration  May 2008   showed non neoplastic goiter    OB History    Gravida Para Term Preterm AB Living   3 3 2 1   3    SAB TAB Ectopic Multiple Live Births           3       Home Medications    Prior to Admission medications   Medication Sig Start Date End Date Taking? Authorizing Provider  calcium-vitamin D (OSCAL WITH D) 500-200 MG-UNIT tablet Take 1 tablet by mouth.    Historical Provider, MD  cetirizine (ZYRTEC) 10 MG tablet Take 1 tablet (10 mg total) by mouth daily. 08/31/16   Kayla Rose, PA-C  fluticasone (FLONASE) 50 MCG/ACT nasal spray Place 2 sprays into both nostrils daily. 08/31/16   Gloriann Loan, PA-C  ibuprofen (ADVIL,MOTRIN) 800 MG tablet Take 1 tablet (800 mg total) by mouth 3 (three) times daily. 08/31/16   Gloriann Loan, PA-C  Multiple Vitamins-Minerals (MULTIVITAMIN WITH MINERALS) tablet Take 1 tablet by mouth daily.    Historical Provider, MD  Probiotic Product (PROBIOTIC PO) Take 1 tablet by mouth as needed.     Historical Provider, MD  Pseudoephedrine-Naproxen Na (ALEVE-D SINUS & COLD) 120-220 MG TB12 Take 1 tablet by mouth as needed.     Historical Provider, MD    Family History Family History  Problem Relation Age of Onset  . Thyroid disease Mother   . Diabetes Mother   . Asthma Mother   . Hypertension Father   . Hyperlipidemia Father   . Diabetes Father   . Emphysema Other   . Coronary artery disease Other   . Cancer Other     pancreatic and breast cancer  . Cancer Other     lung and prostate cancer    Social History Social History  Substance Use Topics  . Smoking status:  Never Smoker  . Smokeless tobacco: Never Used  . Alcohol use No     Allergies   Sulfa antibiotics; Contrast media [iodinated diagnostic agents]; Promethazine hcl; and Tdap [diphth-acell pertussis-tetanus]   Review of Systems Review of Systems  Constitutional: Negative for chills and fever.  HENT: Negative for sore throat.   Eyes: Negative for visual disturbance.  Respiratory: Negative for shortness of breath.   Cardiovascular: Negative for chest pain.  Gastrointestinal: Positive for abdominal pain, diarrhea and nausea. Negative for blood in stool and vomiting.  Genitourinary: Negative for difficulty urinating, dysuria, frequency, hematuria and urgency.  Musculoskeletal: Positive for neck pain. Negative for neck stiffness.  Skin: Negative for rash and wound.  Neurological: Negative for weakness and numbness.     Physical Exam Updated Vital Signs BP 148/98   Pulse 98   Temp 97.7 F (36.5 C)   Resp 18   Ht 5\' 5"  (1.651 m)   SpO2 100%   Physical Exam  Constitutional:  She appears well-developed and well-nourished. No distress.  Nontoxic appearing.  HENT:  Head: Normocephalic and atraumatic.  Mouth/Throat: Oropharynx is clear and moist.  Eyes: Conjunctivae are normal. Pupils are equal, round, and reactive to light. Right eye exhibits no discharge. Left eye exhibits no discharge.  Neck: Normal range of motion. Neck supple. No JVD present.  Cardiovascular: Normal rate, regular rhythm, normal heart sounds and intact distal pulses.  Exam reveals no gallop and no friction rub.   No murmur heard. Pulmonary/Chest: Effort normal and breath sounds normal. No stridor. No respiratory distress. She has no wheezes. She has no rales.  Abdominal: Soft. Bowel sounds are normal. She exhibits no distension and no mass. There is no tenderness. There is no rebound and no guarding.  Abdomen is soft and non-tender to palpation. No CVA or flank tenderness. Bowel sounds are present. No peritoneal  signs. No psoas or obturator sign.  Musculoskeletal: She exhibits no edema.  Lymphadenopathy:    She has no cervical adenopathy.  Neurological: She is alert. Coordination normal.  Skin: Skin is warm and dry. Capillary refill takes less than 2 seconds. No rash noted. She is not diaphoretic. No erythema. No pallor.  Psychiatric: She has a normal mood and affect. Her behavior is normal.  Nursing note and vitals reviewed.    ED Treatments / Results  Labs (all labs ordered are listed, but only abnormal results are displayed) Labs Reviewed  URINALYSIS, ROUTINE W REFLEX MICROSCOPIC - Abnormal; Notable for the following:       Result Value   Glucose, UA >=500 (*)    All other components within normal limits  URINALYSIS, MICROSCOPIC (REFLEX) - Abnormal; Notable for the following:    Bacteria, UA RARE (*)    Squamous Epithelial / LPF 0-5 (*)    All other components within normal limits  CBC WITH DIFFERENTIAL/PLATELET  COMPREHENSIVE METABOLIC PANEL  LIPASE, BLOOD    EKG  EKG Interpretation None       Radiology No results found.  Procedures Procedures (including critical care time)  DIAGNOSTIC STUDIES: Oxygen Saturation is 100% on RA, normal by my interpretation.    COORDINATION OF CARE: 7:37 PM Discussed treatment plan with pt at bedside and pt agreed to plan.  Medications Ordered in ED Medications  sodium chloride 0.9 % bolus 1,000 mL (not administered)  ondansetron (ZOFRAN) injection 4 mg (not administered)  dicyclomine (BENTYL) capsule 10 mg (not administered)     Initial Impression / Assessment and Plan / ED Course  I have reviewed the triage vital signs and the nursing notes.  Pertinent labs & imaging results that were available during my care of the patient were reviewed by me and considered in my medical decision making (see chart for details).  Clinical Course    This is a 40 y.o. female who presents to the Emergency Department complaining of sudden onset,  intermittent, abdominal pain beginning 9 days ago. She states her pain presents in random locations throughout her abdomen. She reports associated nausea, and diarrhea. She notes some abdominal distension initially that has resolved. Pt reports that after the first day of onset, her symptoms improved for a few days but have worsened over the last several days. She states her last episode of diarrhea was yesterday evening. Pt reports she could not eat all of her [employer] this afternoon because of her abdominal pain and nausea. She notes some of her coworkers have recently had a "stomach bug." She notes a pertinent PSHx of abdominal hysterectomy  and C-section. She also reports right neck pain for "a while." On exam the patient is afebrile nontoxic appearing. She is obese. Abdomen is soft nontender to palpation. No peritoneal signs. No CVA or flank tenderness. I discussed the plan that we would obtain blood work and provide her with a fluid bolus, and nausea medicine and pain medicine and reevaluate. Patient agrees with plan. I asked how her blood sugars were doing and patient reports she is not diabetic. Patient does have a diagnosis of diabetes on her past medical history. I advised we could check her blood sugar here today and take a look at how it is doing. She agrees with plan.   Later, RN reports that the patient wants to leave AMA. I went back to the room and the patient was dressed and about to walk out the door. I asked why she was leaving and she tells me she just wanted to go and walks out past me. RN tells me later that she really only came because of her neck pain and not her abdominal pain, but this hardly even discussed by the patient, she mentioned it in passing during exam. Patient left without even explaining to me why she was leaving. Patient left AMA.   Final Clinical Impressions(s) / ED Diagnoses   Final diagnoses:  Generalized abdominal pain    New Prescriptions Discharge Medication  List as of 10/18/2016  7:45 PM     I personally performed the services described in this documentation, which was scribed in my presence. The recorded information has been reviewed and is accurate.      Waynetta Pean, PA-C 10/18/16 1954    Leo Grosser, MD 10/19/16 [phone]

## 2016-10-18 NOTE — ED Triage Notes (Signed)
Pt c/o diffuse abd pain with nausea only x 1 week

## 2016-10-18 NOTE — ED Notes (Signed)
This RN entered as EDP was finishing exam and leaving room. Pt started into tears stating that the EDP did not listen to any of her complaints. Offered to have supervising EDP to evaluate her. Pt states she is upset and does not want to talk to anyone else here and she just wants to get dressed and leave. Pt refusing any further care at this point. AMA signature obtained and EDP notified of pt's decision. EDP went back into room, but pt stated she was leaving.

## 2016-12-01 ENCOUNTER — Emergency Department (HOSPITAL_BASED_OUTPATIENT_CLINIC_OR_DEPARTMENT_OTHER)
Admission: EM | Admit: 2016-12-01 | Discharge: 2016-12-01 | Disposition: A | Discharge status: Home / Self Care | Payer: BLUE CROSS/BLUE SHIELD | Attending: Emergency Medicine | Admitting: Emergency Medicine

## 2016-12-01 ENCOUNTER — Encounter (HOSPITAL_BASED_OUTPATIENT_CLINIC_OR_DEPARTMENT_OTHER): Payer: Self-pay | Admitting: *Deleted

## 2016-12-01 DIAGNOSIS — E119 Type 2 diabetes mellitus without complications: Secondary | ICD-10-CM | POA: Insufficient documentation

## 2016-12-01 DIAGNOSIS — F909 Attention-deficit hyperactivity disorder, unspecified type: Secondary | ICD-10-CM | POA: Insufficient documentation

## 2016-12-01 DIAGNOSIS — Z79899 Other long term (current) drug therapy: Secondary | ICD-10-CM | POA: Insufficient documentation

## 2016-12-01 DIAGNOSIS — M5441 Lumbago with sciatica, right side: Secondary | ICD-10-CM | POA: Insufficient documentation

## 2016-12-01 DIAGNOSIS — M5431 Sciatica, right side: Secondary | ICD-10-CM

## 2016-12-01 MED ORDER — DEXAMETHASONE SODIUM PHOSPHATE 10 MG/ML IJ SOLN
INTRAMUSCULAR | Status: AC
  Filled 2016-12-01: qty 1

## 2016-12-01 MED ORDER — DEXAMETHASONE 1 MG/ML PO CONC
10.0000 mg | Freq: Once | ORAL | Status: AC
  Administered 2016-12-01: 10 mg via ORAL
  Filled 2016-12-01: qty 10

## 2016-12-01 MED ORDER — KETOROLAC TROMETHAMINE 60 MG/2ML IM SOLN
60.0000 mg | Freq: Once | INTRAMUSCULAR | Status: AC
  Administered 2016-12-01: 60 mg via INTRAMUSCULAR
  Filled 2016-12-01: qty 2

## 2016-12-01 MED ORDER — CYCLOBENZAPRINE HCL 10 MG PO TABS: 10.0000 mg | ORAL_TABLET | Freq: Two times a day (BID) | ORAL | 0 refills | Status: DC | PRN

## 2016-12-01 MED ORDER — IBUPROFEN 800 MG PO TABS: 800.0000 mg | ORAL_TABLET | Freq: Three times a day (TID) | ORAL | 0 refills | Status: DC

## 2016-12-01 NOTE — ED Triage Notes (Signed)
Back pain. Hx of same but states she made it worse with lifting boxes.

## 2016-12-01 NOTE — ED Notes (Signed)
Pt. Reports she has no trouble peeing or pooping.  Pt. Reports she works for a [employer] and is having difficulty doing the job she is doing at this time.

## 2016-12-01 NOTE — ED Provider Notes (Signed)
[facility] Provider Note   CSN: [id number] Arrival date & time: 12/01/16  1750     History   Chief Complaint Chief Complaint  Patient presents with  . Back Pain    HPI Shirley [name] is a 41 y.o. female.  The history is provided by the patient. No language interpreter was used.  Back Pain     Shirley [name] is a 41 y.o. female who presents to the Emergency Department complaining of back pain.  She reports 2-3 days of pain in her right low back that radiates down her right leg. She has been packing and lifting boxes at work but states that she has been using proper lifting technique. She denies any fevers, nausea, vomiting, abdominal pain, dysuria, numbness, weakness. Pain is worse with ambulation. She has tried ibuprofen at home with only mild relief. Pain significantly worsened today. Past Medical History:  Diagnosis Date  . ADHD (attention deficit hyperactivity disorder)   . ALLERGIC RHINITIS 06/26/2008  . ANEMIA-IRON DEFICIENCY 06/26/2008  . 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  . ANKLE PAIN, LEFT 06/26/2008  . BACK PAIN, LUMBAR 10/15/2007  . BIPOLAR DISORDER UNSPECIFIED 09/13/2007  . CERVICAL RADICULOPATHY, LEFT 06/26/2008  . Cervicalgia 10/15/2007  . Chronic pain syndrome 03/21/2011  . COMMON MIGRAINE 06/26/2008  . DIABETES MELLITUS, TYPE II 06/05/2007  . DYSPNEA 12/05/2007  . EAR PAIN, RIGHT 12/05/2007  . ELEVATED BP READING WITHOUT DX HYPERTENSION 07/29/2009  . FATIGUE 12/05/2007  . FIBROMYALGIA 06/26/2008  . GASTROENTERITIS, ACUTE 12/15/2008  . GERD 06/26/2008  . Headache(784.0) 09/13/2007  . HYPERLIPIDEMIA 09/13/2007  . JOINT EFFUSION, RIGHT KNEE 07/29/2009  . KNEE PAIN, LEFT 11/21/2010  . LUMBAR RADICULOPATHY, LEFT 06/26/2008  . OTHER DISEASE OF PHARYNX OR NASOPHARYNX 07/03/2008  . PERIPHERAL EDEMA 01/06/2009  . POLYARTHRITIS 11/21/2010  . SHOULDER PAIN, BILATERAL 07/03/2008  . SINUSITIS- ACUTE-NOS 07/29/2009  . TACHYCARDIA 12/05/2007  . THYROID NODULE, RIGHT 11/20/2008  . TREMOR  01/02/2008  . Vertigo     Patient Active Problem List   Diagnosis Date Noted  . MRSA (methicillin resistant staph aureus) urine culture positive on 07/06/16 07/06/2016  . Foot injury 12/20/2015  . Bilateral thoracic back pain 12/20/2015  . Lower back pain 12/01/2015  . Left shoulder pain 11/07/2013  . Left flank pain 11/07/2013  . Encounter for long-term (current) use of high-risk medication 05/29/2011  . Preventative health care 05/28/2011  . Chronic pain syndrome 03/21/2011  . KNEE PAIN, LEFT 11/21/2010  . POLYARTHRITIS 11/21/2010  . BACK PAIN 11/22/2009  . JOINT EFFUSION, RIGHT KNEE 07/29/2009  . ELEVATED BP READING WITHOUT DX HYPERTENSION 07/29/2009  . 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  . PERIPHERAL EDEMA 01/06/2009  . GASTROENTERITIS, ACUTE 12/15/2008  . DYSPHAGIA UNSPECIFIED 11/23/2008  . THYROID NODULE, RIGHT 11/20/2008  . SHOULDER PAIN, BILATERAL 07/03/2008  . ANEMIA-IRON DEFICIENCY 06/26/2008  . COMMON MIGRAINE 06/26/2008  . ALLERGIC RHINITIS 06/26/2008  . GERD 06/26/2008  . ANKLE PAIN, LEFT 06/26/2008  . CERVICAL RADICULOPATHY, LEFT 06/26/2008  . LUMBAR RADICULOPATHY, LEFT 06/26/2008  . FIBROMYALGIA 06/26/2008  . TREMOR 01/02/2008  . EAR PAIN, RIGHT 12/05/2007  . FATIGUE 12/05/2007  . TACHYCARDIA 12/05/2007  . DYSPNEA 12/05/2007  . Cervicalgia 10/15/2007  . BACK PAIN, LUMBAR 10/15/2007  . Pain in Soft Tissues of Limb 10/15/2007  . Hyperlipidemia 09/13/2007  . BIPOLAR DISORDER UNSPECIFIED 09/13/2007  . Headache(784.0) 09/13/2007  . Diabetes ([place]) 06/05/2007    Past Surgical History:  Procedure Laterality Date  . ABDOMINAL HYSTERECTOMY     fibroids  . back  surgury     lumbar 2001  . CESAREAN SECTION     x 3  . thyroid fine needle aspiration  May 2008   showed non neoplastic goiter    OB History    Gravida Para Term Preterm AB Living   3 3 2 1   3    SAB TAB Ectopic Multiple Live Births           3       Home Medications    Prior to Admission medications    Medication Sig Start Date End Date Taking? Authorizing Provider  calcium-vitamin D (OSCAL WITH D) 500-200 MG-UNIT tablet Take 1 tablet by mouth.   Yes Historical Provider, MD  cetirizine (ZYRTEC) 10 MG tablet Take 1 tablet (10 mg total) by mouth daily. 08/31/16  Yes Kayla Rose, PA-C  fluticasone (FLONASE) 50 MCG/ACT nasal spray Place 2 sprays into both nostrils daily. 08/31/16  Yes Gloriann Loan, PA-C  Multiple Vitamins-Minerals (MULTIVITAMIN WITH MINERALS) tablet Take 1 tablet by mouth daily.   Yes Historical Provider, MD  Pseudoephedrine-Naproxen Na (ALEVE-D SINUS & COLD) 120-220 MG TB12 Take 1 tablet by mouth as needed.    Yes Historical Provider, MD  cyclobenzaprine (FLEXERIL) 10 MG tablet Take 1 tablet (10 mg total) by mouth 2 (two) times daily as needed for muscle spasms. 12/01/16   Quintella Reichert, MD  ibuprofen (ADVIL,MOTRIN) 800 MG tablet Take 1 tablet (800 mg total) by mouth 3 (three) times daily. 12/01/16   Quintella Reichert, MD  Probiotic Product (PROBIOTIC PO) Take 1 tablet by mouth as needed.     Historical Provider, MD    Family History Family History  Problem Relation Age of Onset  . Thyroid disease Mother   . Diabetes Mother   . Asthma Mother   . Hypertension Father   . Hyperlipidemia Father   . Diabetes Father   . Emphysema Other   . Coronary artery disease Other   . Cancer Other     pancreatic and breast cancer  . Cancer Other     lung and prostate cancer    Social History Social History  Substance Use Topics  . Smoking status: Never Smoker  . Smokeless tobacco: Never Used  . Alcohol use No     Allergies   Sulfa antibiotics; Contrast media [iodinated diagnostic agents]; Promethazine hcl; and Tdap [diphth-acell pertussis-tetanus]   Review of Systems Review of Systems  Musculoskeletal: Positive for back pain.  All other systems reviewed and are negative.    Physical Exam Updated Vital Signs BP 134/97   Pulse 96   Temp 98.3 F (36.8 C) (Oral)   Resp 20    Ht 5\' 5"  (1.651 m)   Wt 203 lb (92.1 kg)   SpO2 100%   BMI 33.78 kg/m   Physical Exam  Constitutional: She is oriented to person, place, and time. She appears well-developed and well-nourished.  HENT:  Head: Normocephalic and atraumatic.  Cardiovascular: Normal rate and regular rhythm.   No murmur heard. Pulmonary/Chest: Effort normal and breath sounds normal. No respiratory distress.  Abdominal: Soft. There is no tenderness. There is no rebound and no guarding.  Musculoskeletal: She exhibits no edema or tenderness.  2+ femoral pulses bilaterally  Neurological: She is alert and oriented to person, place, and time.  5 out of 5 strength in bilateral lower extremities, normal gait. Sensation to light touch intact in bilateral lower extremities.  Skin: Skin is warm and dry.  Psychiatric: She has a normal mood and  affect. Her behavior is normal.  Nursing note and vitals reviewed.    ED Treatments / Results  Labs (all labs ordered are listed, but only abnormal results are displayed) Labs Reviewed - No data to display  EKG  EKG Interpretation None       Radiology No results found.  Procedures Procedures (including critical care time)  Medications Ordered in ED Medications  ketorolac (TORADOL) injection 60 mg (not administered)  dexamethasone (DECADRON) 1 MG/ML solution 10 mg (not administered)     Initial Impression / Assessment and Plan / ED Course  I have reviewed the triage vital signs and the nursing notes.  Pertinent labs & imaging results that were available during my care of the patient were reviewed by me and considered in my medical decision making (see chart for details).     Patient here for evaluation of pain that initiates near her SI joint and radiates down the right leg. She is neurovascularly intact on examination with no evidence of acute infectious process. Discussed with patient home care for sciatica with rest, analgesics, outpatient follow-up and  return precautions.  Final Clinical Impressions(s) / ED Diagnoses   Final diagnoses:  Sciatica of right side    New Prescriptions New Prescriptions   CYCLOBENZAPRINE (FLEXERIL) 10 MG TABLET    Take 1 tablet (10 mg total) by mouth 2 (two) times daily as needed for muscle spasms.   IBUPROFEN (ADVIL,MOTRIN) 800 MG TABLET    Take 1 tablet (800 mg total) by mouth 3 (three) times daily.     Quintella Reichert, MD 12/01/16 2127

## 2016-12-21 ENCOUNTER — Encounter (HOSPITAL_BASED_OUTPATIENT_CLINIC_OR_DEPARTMENT_OTHER): Payer: Self-pay | Admitting: Emergency Medicine

## 2016-12-21 ENCOUNTER — Emergency Department (HOSPITAL_BASED_OUTPATIENT_CLINIC_OR_DEPARTMENT_OTHER)
Admission: EM | Admit: 2016-12-21 | Discharge: 2016-12-21 | Disposition: A | Discharge status: Home / Self Care | Payer: BLUE CROSS/BLUE SHIELD | Attending: Dermatology | Admitting: Dermatology

## 2016-12-21 DIAGNOSIS — R0981 Nasal congestion: Secondary | ICD-10-CM | POA: Insufficient documentation

## 2016-12-21 DIAGNOSIS — E119 Type 2 diabetes mellitus without complications: Secondary | ICD-10-CM | POA: Insufficient documentation

## 2016-12-21 DIAGNOSIS — R52 Pain, unspecified: Secondary | ICD-10-CM | POA: Insufficient documentation

## 2016-12-21 DIAGNOSIS — R509 Fever, unspecified: Secondary | ICD-10-CM | POA: Insufficient documentation

## 2016-12-21 DIAGNOSIS — Z5321 Procedure and treatment not carried out due to patient leaving prior to being seen by health care provider: Secondary | ICD-10-CM | POA: Insufficient documentation

## 2016-12-21 DIAGNOSIS — F909 Attention-deficit hyperactivity disorder, unspecified type: Secondary | ICD-10-CM | POA: Insufficient documentation

## 2016-12-21 NOTE — ED Triage Notes (Signed)
Congestion, fever, body aches since Sunday. Taking OTC meds without relief.

## 2017-03-05 ENCOUNTER — Emergency Department (HOSPITAL_BASED_OUTPATIENT_CLINIC_OR_DEPARTMENT_OTHER): Payer: Self-pay

## 2017-03-05 ENCOUNTER — Encounter (HOSPITAL_BASED_OUTPATIENT_CLINIC_OR_DEPARTMENT_OTHER): Payer: Self-pay | Admitting: *Deleted

## 2017-03-05 ENCOUNTER — Emergency Department (HOSPITAL_BASED_OUTPATIENT_CLINIC_OR_DEPARTMENT_OTHER)
Admission: EM | Admit: 2017-03-05 | Discharge: 2017-03-05 | Disposition: A | Discharge status: Home / Self Care | Payer: Self-pay | Attending: Emergency Medicine | Admitting: Emergency Medicine

## 2017-03-05 DIAGNOSIS — R079 Chest pain, unspecified: Secondary | ICD-10-CM

## 2017-03-05 DIAGNOSIS — Z79899 Other long term (current) drug therapy: Secondary | ICD-10-CM | POA: Insufficient documentation

## 2017-03-05 DIAGNOSIS — R0789 Other chest pain: Secondary | ICD-10-CM | POA: Insufficient documentation

## 2017-03-05 DIAGNOSIS — Z791 Long term (current) use of non-steroidal anti-inflammatories (NSAID): Secondary | ICD-10-CM | POA: Insufficient documentation

## 2017-03-05 DIAGNOSIS — F909 Attention-deficit hyperactivity disorder, unspecified type: Secondary | ICD-10-CM | POA: Insufficient documentation

## 2017-03-05 DIAGNOSIS — E119 Type 2 diabetes mellitus without complications: Secondary | ICD-10-CM | POA: Insufficient documentation

## 2017-03-05 LAB — CBC WITH DIFFERENTIAL/PLATELET
Basophils Absolute: 0 10*3/uL (ref 0.0–0.1)
Basophils Relative: 0 %
Eosinophils Absolute: 0.1 10*3/uL (ref 0.0–0.7)
Eosinophils Relative: 1 %
HCT: 38.5 % (ref 36.0–46.0)
Hemoglobin: 13.2 g/dL (ref 12.0–15.0)
Lymphocytes Relative: 25 %
Lymphs Abs: 2.2 10*3/uL (ref 0.7–4.0)
MCH: 28.3 pg (ref 26.0–34.0)
MCHC: 34.3 g/dL (ref 30.0–36.0)
MCV: 82.4 fL (ref 78.0–100.0)
Monocytes Absolute: 0.7 10*3/uL (ref 0.1–1.0)
Monocytes Relative: 8 %
Neutro Abs: 5.7 10*3/uL (ref 1.7–7.7)
Neutrophils Relative %: 66 %
Platelets: 325 10*3/uL (ref 150–400)
RBC: 4.67 MIL/uL (ref 3.87–5.11)
RDW: 13.1 % (ref 11.5–15.5)
WBC: 8.7 10*3/uL (ref 4.0–10.5)

## 2017-03-05 LAB — COMPREHENSIVE METABOLIC PANEL
ALT: 21 U/L (ref 14–54)
AST: 24 U/L (ref 15–41)
Albumin: 3.6 g/dL (ref 3.5–5.0)
Alkaline Phosphatase: 100 U/L (ref 38–126)
Anion gap: 7 (ref 5–15)
BUN: 12 mg/dL (ref 6–20)
CO2: 21 mmol/L — ABNORMAL LOW (ref 22–32)
Calcium: 9 mg/dL (ref 8.9–10.3)
Chloride: 103 mmol/L (ref 101–111)
Creatinine, Ser: 0.8 mg/dL (ref 0.44–1.00)
GFR calc Af Amer: >60 mL/min (ref >60)
GFR calc non Af Amer: >60 mL/min (ref >60)
Glucose, Bld: 317 mg/dL — ABNORMAL HIGH (ref 65–99)
Potassium: 3.9 mmol/L (ref 3.5–5.1)
Sodium: 131 mmol/L — ABNORMAL LOW (ref 135–145)
Total Bilirubin: 0.4 mg/dL (ref 0.3–1.2)
Total Protein: 7.4 g/dL (ref 6.5–8.1)

## 2017-03-05 LAB — TROPONIN I: Troponin I: <0.03 ng/mL (ref <0.03)

## 2017-03-05 MED ORDER — KETOROLAC TROMETHAMINE 30 MG/ML IJ SOLN
30.0000 mg | Freq: Once | INTRAMUSCULAR | Status: AC
  Administered 2017-03-05: 30 mg via INTRAVENOUS
  Filled 2017-03-05: qty 1

## 2017-03-05 NOTE — ED Provider Notes (Signed)
[facility] Provider Note   CSN: [id number] Arrival date & time: 03/05/17  4098     History   Chief Complaint Chief Complaint  Patient presents with  . Chest Pain    HPI Shirley [name] is a 41 y.o. female.  Patient is a 41 year old female with past medical history of bipolar, fibromyalgia, reflux. She presents for evaluation of chest discomfort. This started yesterday while at cookout. It began in the absence of any injury, stress, or trauma. She denies any shortness of breath, nausea, diaphoresis, or radiation to the arm or jaw. Her pain is worse when she breathes and when she pushes on her chest. She denies any fevers, chills, or cough.   The history is provided by the patient.  Chest Pain   This is a new problem. The current episode started yesterday. The problem occurs constantly. The problem has been gradually worsening. The pain is associated with movement and breathing. The pain is moderate. The quality of the pain is described as pressure-like. The pain does not radiate.    Past Medical History:  Diagnosis Date  . ADHD (attention deficit hyperactivity disorder)   . ALLERGIC RHINITIS 06/26/2008  . ANEMIA-IRON DEFICIENCY 06/26/2008  . 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  . ANKLE PAIN, LEFT 06/26/2008  . BACK PAIN, LUMBAR 10/15/2007  . BIPOLAR DISORDER UNSPECIFIED 09/13/2007  . CERVICAL RADICULOPATHY, LEFT 06/26/2008  . Cervicalgia 10/15/2007  . Chronic pain syndrome 03/21/2011  . COMMON MIGRAINE 06/26/2008  . DIABETES MELLITUS, TYPE II 06/05/2007  . DYSPNEA 12/05/2007  . EAR PAIN, RIGHT 12/05/2007  . ELEVATED BP READING WITHOUT DX HYPERTENSION 07/29/2009  . FATIGUE 12/05/2007  . FIBROMYALGIA 06/26/2008  . GASTROENTERITIS, ACUTE 12/15/2008  . GERD 06/26/2008  . Headache(784.0) 09/13/2007  . HYPERLIPIDEMIA 09/13/2007  . JOINT EFFUSION, RIGHT KNEE 07/29/2009  . KNEE PAIN, LEFT 11/21/2010  . LUMBAR RADICULOPATHY, LEFT 06/26/2008  . OTHER DISEASE OF PHARYNX OR NASOPHARYNX 07/03/2008  .  PERIPHERAL EDEMA 01/06/2009  . POLYARTHRITIS 11/21/2010  . SHOULDER PAIN, BILATERAL 07/03/2008  . SINUSITIS- ACUTE-NOS 07/29/2009  . TACHYCARDIA 12/05/2007  . THYROID NODULE, RIGHT 11/20/2008  . TREMOR 01/02/2008  . Vertigo     Patient Active Problem List   Diagnosis Date Noted  . MRSA (methicillin resistant staph aureus) urine culture positive on 07/06/16 07/06/2016  . Foot injury 12/20/2015  . Bilateral thoracic back pain 12/20/2015  . Lower back pain 12/01/2015  . Left shoulder pain 11/07/2013  . Left flank pain 11/07/2013  . Encounter for long-term (current) use of high-risk medication 05/29/2011  . Preventative health care 05/28/2011  . Chronic pain syndrome 03/21/2011  . KNEE PAIN, LEFT 11/21/2010  . POLYARTHRITIS 11/21/2010  . BACK PAIN 11/22/2009  . JOINT EFFUSION, RIGHT KNEE 07/29/2009  . ELEVATED BP READING WITHOUT DX HYPERTENSION 07/29/2009  . 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  . PERIPHERAL EDEMA 01/06/2009  . GASTROENTERITIS, ACUTE 12/15/2008  . DYSPHAGIA UNSPECIFIED 11/23/2008  . THYROID NODULE, RIGHT 11/20/2008  . SHOULDER PAIN, BILATERAL 07/03/2008  . ANEMIA-IRON DEFICIENCY 06/26/2008  . COMMON MIGRAINE 06/26/2008  . ALLERGIC RHINITIS 06/26/2008  . GERD 06/26/2008  . ANKLE PAIN, LEFT 06/26/2008  . CERVICAL RADICULOPATHY, LEFT 06/26/2008  . LUMBAR RADICULOPATHY, LEFT 06/26/2008  . FIBROMYALGIA 06/26/2008  . TREMOR 01/02/2008  . EAR PAIN, RIGHT 12/05/2007  . FATIGUE 12/05/2007  . TACHYCARDIA 12/05/2007  . DYSPNEA 12/05/2007  . Cervicalgia 10/15/2007  . BACK PAIN, LUMBAR 10/15/2007  . Pain in Soft Tissues of Limb 10/15/2007  . Hyperlipidemia 09/13/2007  . BIPOLAR DISORDER UNSPECIFIED  09/13/2007  . Headache(784.0) 09/13/2007  . Diabetes ([place]) 06/05/2007    Past Surgical History:  Procedure Laterality Date  . ABDOMINAL HYSTERECTOMY     fibroids  . back surgury     lumbar 2001  . CESAREAN SECTION     x 3  . thyroid fine needle aspiration  May 2008   showed non  neoplastic goiter    OB History    Gravida Para Term Preterm AB Living   3 3 2 1   3    SAB TAB Ectopic Multiple Live Births           3       Home Medications    Prior to Admission medications   Medication Sig Start Date End Date Taking? Authorizing Provider  calcium-vitamin D (OSCAL WITH D) 500-200 MG-UNIT tablet Take 1 tablet by mouth.   Yes [provider]  cetirizine (ZYRTEC) 10 MG tablet Take 1 tablet (10 mg total) by mouth daily. 08/31/16  Yes Gloriann Loan, PA-C  fluticasone (FLONASE) 50 MCG/ACT nasal spray Place 2 sprays into both nostrils daily. 08/31/16  Yes Gloriann Loan, PA-C  ibuprofen (ADVIL,MOTRIN) 800 MG tablet Take 1 tablet (800 mg total) by mouth 3 (three) times daily. 12/01/16  Yes Quintella Reichert, MD  Multiple Vitamins-Minerals (MULTIVITAMIN WITH MINERALS) tablet Take 1 tablet by mouth daily.   Yes [provider]  cyclobenzaprine (FLEXERIL) 10 MG tablet Take 1 tablet (10 mg total) by mouth 2 (two) times daily as needed for muscle spasms. 12/01/16   Quintella Reichert, MD  Probiotic Product (PROBIOTIC PO) Take 1 tablet by mouth as needed.     [provider]  Pseudoephedrine-Naproxen Na (ALEVE-D SINUS & COLD) 120-220 MG TB12 Take 1 tablet by mouth as needed.     [provider]    Family History Family History  Problem Relation Age of Onset  . Thyroid disease Mother   . Diabetes Mother   . Asthma Mother   . Hypertension Father   . Hyperlipidemia Father   . Diabetes Father   . Emphysema Other   . Coronary artery disease Other   . Cancer Other        pancreatic and breast cancer  . Cancer Other        lung and prostate cancer    Social History Social History  Substance Use Topics  . Smoking status: Never Smoker  . Smokeless tobacco: Never Used  . Alcohol use No     Allergies   Sulfa antibiotics; Contrast media [iodinated diagnostic agents]; Promethazine hcl; and Tdap [diphth-acell pertussis-tetanus]   Review of  Systems Review of Systems  Cardiovascular: Positive for chest pain.  All other systems reviewed and are negative.    Physical Exam Updated Vital Signs BP (!) 130/92 (BP Location: Right Arm)   Pulse (!) 105   Temp 98.1 F (36.7 C) (Oral)   Resp (!) 24   Ht 5\' 5"  (1.651 m)   Wt 210 lb (95.3 kg)   SpO2 100%   BMI 34.95 kg/m   Physical Exam  Constitutional: She is oriented to person, place, and time. She appears well-developed and well-nourished. No distress.  HENT:  Head: Normocephalic and atraumatic.  Neck: Normal range of motion. Neck supple.  Cardiovascular: Normal rate and regular rhythm.  Exam reveals no gallop and no friction rub.   No murmur heard. Pulmonary/Chest: Effort normal and breath sounds normal. No respiratory distress. She has no wheezes. She has no rales. She exhibits tenderness.  There is tenderness to palpation of the anterior chest wall. This reproduces her symptoms.  Abdominal: Soft. Bowel sounds are normal. She exhibits no distension. There is no tenderness.  Musculoskeletal: Normal range of motion.  Neurological: She is alert and oriented to person, place, and time.  Skin: Skin is warm and dry. She is not diaphoretic.  Nursing note and vitals reviewed.    ED Treatments / Results  Labs (all labs ordered are listed, but only abnormal results are displayed) Labs Reviewed  COMPREHENSIVE METABOLIC PANEL  CBC WITH DIFFERENTIAL/PLATELET  TROPONIN I    EKG  EKG Interpretation  Date/Time:  Monday Mar 05 2017 06:59:37 EDT Ventricular Rate:  100 PR Interval:    QRS Duration: 103 QT Interval:  366 QTC Calculation: 473 R Axis:   80 Text Interpretation:  Sinus tachycardia Probable left atrial enlargement Confirmed by Kamesha Herne  MD, Bobbye Reinitz ([id number]) on 03/05/2017 7:06:05 AM       Radiology No results found.  Procedures Procedures (including critical care time)  Medications Ordered in ED Medications  ketorolac (TORADOL) 30 MG/ML injection 30 mg (not  administered)     Initial Impression / Assessment and Plan / ED Course  I have reviewed the triage vital signs and the nursing notes.  Pertinent labs & imaging results that were available during my care of the patient were reviewed by me and considered in my medical decision making (see chart for details).  Patient is a 41 year old female who presents with chest pain that is atypical for cardiac pain. It is worse with movement and reproducible with palpation. She had significant relief in the ER with Toradol. Her workup reveals no EKG changes and troponin is negative despite greater than 12 hours of symptoms. I highly suspect a musculoskeletal etiology and believe she is appropriate for discharge. She will be treated with NSAIDs and when necessary return.  Final Clinical Impressions(s) / ED Diagnoses   Final diagnoses:  None    New Prescriptions New Prescriptions   No medications on file     Veryl Speak, MD 03/05/17 [phone]

## 2017-03-05 NOTE — ED Triage Notes (Signed)
Pt reports chest tightness since yesterday that worsens with movement and palpation. Reports radiation to neck. Denies sob, n/v, diaphoresis.

## 2017-03-05 NOTE — Discharge Instructions (Signed)
Ibuprofen 600 mg 3 times daily for the next 3 days.  Follow-up with your primary Dr. if not improving in the next 2-3 days, and return to the ER symptoms significantly worsen or change in the meantime.

## 2017-05-03 ENCOUNTER — Encounter (HOSPITAL_BASED_OUTPATIENT_CLINIC_OR_DEPARTMENT_OTHER): Payer: Self-pay | Admitting: *Deleted

## 2017-05-03 ENCOUNTER — Emergency Department (HOSPITAL_BASED_OUTPATIENT_CLINIC_OR_DEPARTMENT_OTHER)
Admission: EM | Admit: 2017-05-03 | Discharge: 2017-05-03 | Disposition: A | Discharge status: Home / Self Care | Payer: Self-pay | Attending: Emergency Medicine | Admitting: Emergency Medicine

## 2017-05-03 DIAGNOSIS — E119 Type 2 diabetes mellitus without complications: Secondary | ICD-10-CM | POA: Insufficient documentation

## 2017-05-03 DIAGNOSIS — E860 Dehydration: Secondary | ICD-10-CM | POA: Insufficient documentation

## 2017-05-03 DIAGNOSIS — Z79899 Other long term (current) drug therapy: Secondary | ICD-10-CM | POA: Insufficient documentation

## 2017-05-03 LAB — COMPREHENSIVE METABOLIC PANEL
ALT: 21 U/L (ref 14–54)
AST: 17 U/L (ref 15–41)
Albumin: 4.2 g/dL (ref 3.5–5.0)
Alkaline Phosphatase: 97 U/L (ref 38–126)
Anion gap: 10 (ref 5–15)
BUN: 10 mg/dL (ref 6–20)
CO2: 24 mmol/L (ref 22–32)
Calcium: 9.6 mg/dL (ref 8.9–10.3)
Chloride: 98 mmol/L — ABNORMAL LOW (ref 101–111)
Creatinine, Ser: 0.57 mg/dL (ref 0.44–1.00)
GFR calc Af Amer: >60 mL/min (ref >60)
GFR calc non Af Amer: >60 mL/min (ref >60)
Glucose, Bld: 278 mg/dL — ABNORMAL HIGH (ref 65–99)
Potassium: 3.5 mmol/L (ref 3.5–5.1)
Sodium: 132 mmol/L — ABNORMAL LOW (ref 135–145)
Total Bilirubin: 0.5 mg/dL (ref 0.3–1.2)
Total Protein: 8.4 g/dL — ABNORMAL HIGH (ref 6.5–8.1)

## 2017-05-03 LAB — URINALYSIS, ROUTINE W REFLEX MICROSCOPIC
Bilirubin Urine: NEGATIVE
Glucose, UA: >=500 mg/dL — AB
Hgb urine dipstick: NEGATIVE
Ketones, ur: 40 mg/dL — AB
Leukocytes, UA: NEGATIVE
Nitrite: NEGATIVE
Protein, ur: NEGATIVE mg/dL
Specific Gravity, Urine: 1.031 — ABNORMAL HIGH (ref 1.005–1.030)
pH: 5 (ref 5.0–8.0)

## 2017-05-03 LAB — URINALYSIS, MICROSCOPIC (REFLEX)
RBC / HPF: NONE SEEN RBC/hpf (ref 0–5)
WBC, UA: NONE SEEN WBC/hpf (ref 0–5)

## 2017-05-03 LAB — CBC WITH DIFFERENTIAL/PLATELET
Basophils Absolute: 0 10*3/uL (ref 0.0–0.1)
Basophils Relative: 0 %
Eosinophils Absolute: 0.1 10*3/uL (ref 0.0–0.7)
Eosinophils Relative: 1 %
HCT: 38.7 % (ref 36.0–46.0)
Hemoglobin: 13.3 g/dL (ref 12.0–15.0)
Lymphocytes Relative: 31 %
Lymphs Abs: 2.8 10*3/uL (ref 0.7–4.0)
MCH: 28.1 pg (ref 26.0–34.0)
MCHC: 34.4 g/dL (ref 30.0–36.0)
MCV: 81.6 fL (ref 78.0–100.0)
Monocytes Absolute: 0.6 10*3/uL (ref 0.1–1.0)
Monocytes Relative: 7 %
Neutro Abs: 5.5 10*3/uL (ref 1.7–7.7)
Neutrophils Relative %: 61 %
Platelets: 369 10*3/uL (ref 150–400)
RBC: 4.74 MIL/uL (ref 3.87–5.11)
RDW: 13.5 % (ref 11.5–15.5)
WBC: 9 10*3/uL (ref 4.0–10.5)

## 2017-05-03 LAB — CBG MONITORING, ED: Glucose-Capillary: 269 mg/dL — ABNORMAL HIGH (ref 65–99)

## 2017-05-03 MED ORDER — SODIUM CHLORIDE 0.9 % IV BOLUS (SEPSIS)
1000.0000 mL | Freq: Once | INTRAVENOUS | Status: AC
  Administered 2017-05-03: 1000 mL via INTRAVENOUS

## 2017-05-03 NOTE — ED Notes (Signed)
Cramping to all over verbalized.  She think she is dehydrated due to her job.

## 2017-05-03 NOTE — ED Provider Notes (Signed)
[facility] Provider Note   CSN: [id number] Arrival date & time: 05/03/17  1854   By signing my name below, I, Eunice Blase, attest that this documentation has been prepared under the direction and in the presence of [place] N Russo, PA-C. Electronically Signed: Eunice Blase, Scribe. 05/03/17. 2:05 AM.   History   Chief Complaint Chief Complaint  Patient presents with  . Dehydration   The history is provided by the patient and medical records. No language interpreter was used.    Shirley [name] is a 41 y.o. female with h/o anemia and fibromyalgia presenting to the Emergency Department concerning multiple symptoms related to suspected dehydration since yesterday. Pt states she started a new job in a hot environment yesterday and she has experienced multiple symptoms, beginning with nausea, since about mid way through her shift. She states on her way home from work she began to experience leg cramps despite having drank a large amount of liquids throughout the day. She describes this as 5/10, dull, intermittent, muscle cramping. NL appetite reported.  H/o diet controlled DM. She states she takes ibuprofen for chronic joint pain PRN. No h/o kidney or heart disease. No flank pain, hematuria, dysuria, chest pain or SOB. No other complaints at this time.   Past Medical History:  Diagnosis Date  . ADHD (attention deficit hyperactivity disorder)   . ALLERGIC RHINITIS 06/26/2008  . ANEMIA-IRON DEFICIENCY 06/26/2008  . 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  . ANKLE PAIN, LEFT 06/26/2008  . BACK PAIN, LUMBAR 10/15/2007  . BIPOLAR DISORDER UNSPECIFIED 09/13/2007  . CERVICAL RADICULOPATHY, LEFT 06/26/2008  . Cervicalgia 10/15/2007  . Chronic pain syndrome 03/21/2011  . COMMON MIGRAINE 06/26/2008  . DIABETES MELLITUS, TYPE II 06/05/2007  . DYSPNEA 12/05/2007  . EAR PAIN, RIGHT 12/05/2007  . ELEVATED BP READING WITHOUT DX HYPERTENSION 07/29/2009  . FATIGUE 12/05/2007  . FIBROMYALGIA 06/26/2008  .  GASTROENTERITIS, ACUTE 12/15/2008  . GERD 06/26/2008  . Headache(784.0) 09/13/2007  . HYPERLIPIDEMIA 09/13/2007  . JOINT EFFUSION, RIGHT KNEE 07/29/2009  . KNEE PAIN, LEFT 11/21/2010  . LUMBAR RADICULOPATHY, LEFT 06/26/2008  . OTHER DISEASE OF PHARYNX OR NASOPHARYNX 07/03/2008  . PERIPHERAL EDEMA 01/06/2009  . POLYARTHRITIS 11/21/2010  . SHOULDER PAIN, BILATERAL 07/03/2008  . SINUSITIS- ACUTE-NOS 07/29/2009  . TACHYCARDIA 12/05/2007  . THYROID NODULE, RIGHT 11/20/2008  . TREMOR 01/02/2008  . Vertigo     Patient Active Problem List   Diagnosis Date Noted  . MRSA (methicillin resistant staph aureus) urine culture positive on 07/06/16 07/06/2016  . Foot injury 12/20/2015  . Bilateral thoracic back pain 12/20/2015  . Lower back pain 12/01/2015  . Left shoulder pain 11/07/2013  . Left flank pain 11/07/2013  . Encounter for long-term (current) use of high-risk medication 05/29/2011  . Preventative health care 05/28/2011  . Chronic pain syndrome 03/21/2011  . KNEE PAIN, LEFT 11/21/2010  . POLYARTHRITIS 11/21/2010  . BACK PAIN 11/22/2009  . JOINT EFFUSION, RIGHT KNEE 07/29/2009  . ELEVATED BP READING WITHOUT DX HYPERTENSION 07/29/2009  . 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  . PERIPHERAL EDEMA 01/06/2009  . GASTROENTERITIS, ACUTE 12/15/2008  . DYSPHAGIA UNSPECIFIED 11/23/2008  . THYROID NODULE, RIGHT 11/20/2008  . SHOULDER PAIN, BILATERAL 07/03/2008  . ANEMIA-IRON DEFICIENCY 06/26/2008  . COMMON MIGRAINE 06/26/2008  . ALLERGIC RHINITIS 06/26/2008  . GERD 06/26/2008  . ANKLE PAIN, LEFT 06/26/2008  . CERVICAL RADICULOPATHY, LEFT 06/26/2008  . LUMBAR RADICULOPATHY, LEFT 06/26/2008  . FIBROMYALGIA 06/26/2008  . TREMOR 01/02/2008  . EAR PAIN, RIGHT 12/05/2007  . FATIGUE 12/05/2007  .  TACHYCARDIA 12/05/2007  . DYSPNEA 12/05/2007  . Cervicalgia 10/15/2007  . BACK PAIN, LUMBAR 10/15/2007  . Pain in Soft Tissues of Limb 10/15/2007  . Hyperlipidemia 09/13/2007  . BIPOLAR DISORDER UNSPECIFIED 09/13/2007  .  Headache(784.0) 09/13/2007  . Diabetes ([place]) 06/05/2007    Past Surgical History:  Procedure Laterality Date  . ABDOMINAL HYSTERECTOMY     fibroids  . back surgury     lumbar 2001  . CESAREAN SECTION     x 3  . thyroid fine needle aspiration  May 2008   showed non neoplastic goiter    OB History    Gravida Para Term Preterm AB Living   3 3 2 1   3    SAB TAB Ectopic Multiple Live Births           3       Home Medications    Prior to Admission medications   Medication Sig Start Date End Date Taking? Authorizing Provider  calcium-vitamin D (OSCAL WITH D) 500-200 MG-UNIT tablet Take 1 tablet by mouth.    [provider]  cetirizine (ZYRTEC) 10 MG tablet Take 1 tablet (10 mg total) by mouth daily. 08/31/16   Gloriann Loan, PA-C  cyclobenzaprine (FLEXERIL) 10 MG tablet Take 1 tablet (10 mg total) by mouth 2 (two) times daily as needed for muscle spasms. 12/01/16   Quintella Reichert, MD  fluticasone [facility]) 50 MCG/ACT nasal spray Place 2 sprays into both nostrils daily. 08/31/16   Gloriann Loan, PA-C  ibuprofen (ADVIL,MOTRIN) 800 MG tablet Take 1 tablet (800 mg total) by mouth 3 (three) times daily. 12/01/16   Quintella Reichert, MD  Multiple Vitamins-Minerals (MULTIVITAMIN WITH MINERALS) tablet Take 1 tablet by mouth daily.    [provider]  Probiotic Product (PROBIOTIC PO) Take 1 tablet by mouth as needed.     [provider]  Pseudoephedrine-Naproxen Na (ALEVE-D SINUS & COLD) 120-220 MG TB12 Take 1 tablet by mouth as needed.     [provider]    Family History Family History  Problem Relation Age of Onset  . Thyroid disease Mother   . Diabetes Mother   . Asthma Mother   . Hypertension Father   . Hyperlipidemia Father   . Diabetes Father   . Emphysema Other   . Coronary artery disease Other   . Cancer Other        pancreatic and breast cancer  . Cancer Other        lung and prostate cancer    Social History Social History  Substance Use  Topics  . Smoking status: Never Smoker  . Smokeless tobacco: Never Used  . Alcohol use No     Allergies   Sulfa antibiotics; Contrast media [iodinated diagnostic agents]; Promethazine hcl; and Tdap [diphth-acell pertussis-tetanus]   Review of Systems Review of Systems  Constitutional: Positive for appetite change.  Eyes: Negative for visual disturbance.  Respiratory: Negative for shortness of breath.   Cardiovascular: Negative for chest pain.  Gastrointestinal: Positive for constipation and nausea. Negative for diarrhea and vomiting.  Genitourinary: Negative for dysuria, flank pain, frequency and hematuria.  Musculoskeletal: Positive for myalgias.  Neurological: Negative for syncope, light-headedness and headaches.  All other systems reviewed and are negative.    Physical Exam Updated Vital Signs BP 131/84 (BP Location: Right Arm)   Pulse 96   Temp 98.7 F (37.1 C) (Oral)   Resp 18   Ht 5\' 4"  (1.626 m)   Wt 90.7 kg (200 lb)  SpO2 100%   BMI 34.33 kg/m    Physical Exam  Constitutional: She appears well-developed and well-nourished. No distress.  Patient is well-appearing  HENT:  Head: Normocephalic and atraumatic.  Mouth/Throat: Oropharynx is clear and moist.  Eyes: Pupils are equal, round, and reactive to light. Conjunctivae and EOM are normal.  Cardiovascular: Normal rate, regular rhythm, normal heart sounds and intact distal pulses.   Pulmonary/Chest: Effort normal and breath sounds normal. No respiratory distress. She has no wheezes. She has no rales.  Abdominal: Soft. Bowel sounds are normal. There is no tenderness. There is no rebound and no guarding.  Musculoskeletal: Normal range of motion.  Neurological: She is alert.  Skin: Skin is warm.  Psychiatric: She has a normal mood and affect. Her behavior is normal.  Nursing note and vitals reviewed.    ED Treatments / Results  DIAGNOSTIC STUDIES: Oxygen Saturation is 100% on RA, NL by my interpretation.      COORDINATION OF CARE: 8:33 PM-Discussed next steps with pt. Pt verbalized understanding and is agreeable with the plan.   Labs (all labs ordered are listed, but only abnormal results are displayed) Labs Reviewed  URINALYSIS, ROUTINE W REFLEX MICROSCOPIC - Abnormal; Notable for the following:       Result Value   Specific Gravity, Urine 1.031 (*)    Glucose, UA >=500 (*)    Ketones, ur 40 (*)    All other components within normal limits  COMPREHENSIVE METABOLIC PANEL - Abnormal; Notable for the following:    Sodium 132 (*)    Chloride 98 (*)    Glucose, Bld 278 (*)    Total Protein 8.4 (*)    All other components within normal limits  URINALYSIS, MICROSCOPIC (REFLEX) - Abnormal; Notable for the following:    Bacteria, UA RARE (*)    Squamous Epithelial / LPF 0-5 (*)    All other components within normal limits  CBG MONITORING, ED - Abnormal; Notable for the following:    Glucose-Capillary 269 (*)    All other components within normal limits  CBC WITH DIFFERENTIAL/PLATELET  CBG MONITORING, ED    EKG  EKG Interpretation None       Radiology No results found.  Procedures Procedures (including critical care time)  Medications Ordered in ED Medications  sodium chloride 0.9 % bolus 1,000 mL (0 mLs Intravenous Stopped 05/03/17 2142)     Initial Impression / Assessment and Plan / ED Course  I have reviewed the triage vital signs and the nursing notes.  Pertinent labs & imaging results that were available during my care of the patient were reviewed by me and considered in my medical decision making (see chart for details).    Patient with muscle cramping and intermittent nausea, mild signs of dehydration. U/A consistent. Remainder of labs unremarkable. IVF bolus given. Pt with improvement in symptoms. Exam otherwise very reassuring and benign. Pt is well-appearing, not in distress, hemodynamically stable, safe for discharge with PCP follow up.  Patient discussed with  Dr. Oleta Mouse, who agrees with care plan.  Discussed results, findings, treatment and follow up. Patient advised of return precautions. Patient verbalized understanding and agreed with plan.  Final Clinical Impressions(s) / ED Diagnoses   Final diagnoses:  Dehydration    New Prescriptions Discharge Medication List as of 05/03/2017  9:56 PM    I personally performed the services described in this documentation, which was scribed in my presence. The recorded information has been reviewed and is accurate.  Russo, [place] N, PA-C 05/04/17 0206    Forde Dandy, MD 05/04/17 1540

## 2017-05-03 NOTE — ED Notes (Signed)
ED Provider at bedside. 

## 2017-05-03 NOTE — Discharge Instructions (Signed)
Please read instructions below. You drinking plenty of water throughout the day. Take appropriate breaks during work. Avoid very sugary drinks like Gatorade and juice, as your sugar has been elevated today. Follow-up with your primary care provider if symptoms persist. Return to the ER for new or concerning symptoms.

## 2017-05-03 NOTE — ED Triage Notes (Signed)
Cramping. She feels dehydrated. 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 Hx of diabetes controlled with diet.

## 2017-05-19 ENCOUNTER — Emergency Department (HOSPITAL_BASED_OUTPATIENT_CLINIC_OR_DEPARTMENT_OTHER)
Admission: EM | Admit: 2017-05-19 | Discharge: 2017-05-19 | Disposition: A | Discharge status: Home / Self Care | Payer: Medicaid Other | Attending: Emergency Medicine | Admitting: Emergency Medicine

## 2017-05-19 ENCOUNTER — Encounter (HOSPITAL_BASED_OUTPATIENT_CLINIC_OR_DEPARTMENT_OTHER): Payer: Self-pay | Admitting: *Deleted

## 2017-05-19 DIAGNOSIS — E119 Type 2 diabetes mellitus without complications: Secondary | ICD-10-CM | POA: Diagnosis not present

## 2017-05-19 DIAGNOSIS — M549 Dorsalgia, unspecified: Secondary | ICD-10-CM | POA: Diagnosis present

## 2017-05-19 DIAGNOSIS — R197 Diarrhea, unspecified: Secondary | ICD-10-CM

## 2017-05-19 DIAGNOSIS — R200 Anesthesia of skin: Secondary | ICD-10-CM | POA: Insufficient documentation

## 2017-05-19 DIAGNOSIS — G8929 Other chronic pain: Secondary | ICD-10-CM | POA: Diagnosis not present

## 2017-05-19 DIAGNOSIS — F909 Attention-deficit hyperactivity disorder, unspecified type: Secondary | ICD-10-CM | POA: Insufficient documentation

## 2017-05-19 DIAGNOSIS — R110 Nausea: Secondary | ICD-10-CM

## 2017-05-19 DIAGNOSIS — M5441 Lumbago with sciatica, right side: Secondary | ICD-10-CM

## 2017-05-19 DIAGNOSIS — Z79899 Other long term (current) drug therapy: Secondary | ICD-10-CM | POA: Insufficient documentation

## 2017-05-19 LAB — CBC WITH DIFFERENTIAL/PLATELET
Basophils Absolute: 0 10*3/uL (ref 0.0–0.1)
Basophils Relative: 1 %
Eosinophils Absolute: 0.1 10*3/uL (ref 0.0–0.7)
Eosinophils Relative: 2 %
HCT: 38.1 % (ref 36.0–46.0)
Hemoglobin: 12.9 g/dL (ref 12.0–15.0)
Lymphocytes Relative: 29 %
Lymphs Abs: 1.9 10*3/uL (ref 0.7–4.0)
MCH: 27.8 pg (ref 26.0–34.0)
MCHC: 33.9 g/dL (ref 30.0–36.0)
MCV: 82.1 fL (ref 78.0–100.0)
Monocytes Absolute: 0.6 10*3/uL (ref 0.1–1.0)
Monocytes Relative: 9 %
Neutro Abs: 3.9 10*3/uL (ref 1.7–7.7)
Neutrophils Relative %: 59 %
Platelets: 346 10*3/uL (ref 150–400)
RBC: 4.64 MIL/uL (ref 3.87–5.11)
RDW: 13.9 % (ref 11.5–15.5)
WBC: 6.5 10*3/uL (ref 4.0–10.5)

## 2017-05-19 LAB — BASIC METABOLIC PANEL
Anion gap: 9 (ref 5–15)
BUN: 6 mg/dL (ref 6–20)
CO2: 22 mmol/L (ref 22–32)
Calcium: 8.8 mg/dL — ABNORMAL LOW (ref 8.9–10.3)
Chloride: 102 mmol/L (ref 101–111)
Creatinine, Ser: 0.6 mg/dL (ref 0.44–1.00)
GFR calc Af Amer: >60 mL/min (ref >60)
GFR calc non Af Amer: >60 mL/min (ref >60)
Glucose, Bld: 351 mg/dL — ABNORMAL HIGH (ref 65–99)
Potassium: 3.6 mmol/L (ref 3.5–5.1)
Sodium: 133 mmol/L — ABNORMAL LOW (ref 135–145)

## 2017-05-19 LAB — URINALYSIS, ROUTINE W REFLEX MICROSCOPIC
Bilirubin Urine: NEGATIVE
Glucose, UA: >=500 mg/dL — AB
Hgb urine dipstick: NEGATIVE
Ketones, ur: 15 mg/dL — AB
Leukocytes, UA: NEGATIVE
Nitrite: NEGATIVE
Protein, ur: NEGATIVE mg/dL
Specific Gravity, Urine: 1.031 — ABNORMAL HIGH (ref 1.005–1.030)
pH: 5.5 (ref 5.0–8.0)

## 2017-05-19 LAB — URINALYSIS, MICROSCOPIC (REFLEX)

## 2017-05-19 MED ORDER — ONDANSETRON HCL 4 MG/2ML IJ SOLN
4.0000 mg | Freq: Once | INTRAMUSCULAR | Status: AC
  Administered 2017-05-19: 4 mg via INTRAVENOUS
  Filled 2017-05-19: qty 2

## 2017-05-19 MED ORDER — KETOROLAC TROMETHAMINE 30 MG/ML IJ SOLN
30.0000 mg | Freq: Once | INTRAMUSCULAR | Status: AC
  Administered 2017-05-19: 30 mg via INTRAVENOUS
  Filled 2017-05-19: qty 1

## 2017-05-19 MED ORDER — METFORMIN HCL 500 MG PO TABS: 500.0000 mg | ORAL_TABLET | Freq: Two times a day (BID) | ORAL | 0 refills | Status: DC

## 2017-05-19 MED ORDER — METOCLOPRAMIDE HCL 10 MG PO TABS: 10.0000 mg | ORAL_TABLET | Freq: Four times a day (QID) | ORAL | 0 refills | Status: DC | PRN

## 2017-05-19 MED ORDER — SODIUM CHLORIDE 0.9 % IV BOLUS (SEPSIS)
1000.0000 mL | Freq: Once | INTRAVENOUS | Status: AC
  Administered 2017-05-19: 1000 mL via INTRAVENOUS

## 2017-05-19 MED ORDER — CYCLOBENZAPRINE HCL 10 MG PO TABS
10.0000 mg | ORAL_TABLET | Freq: Once | ORAL | Status: AC
  Administered 2017-05-19: 10 mg via ORAL
  Filled 2017-05-19: qty 1

## 2017-05-19 MED ORDER — CYCLOBENZAPRINE HCL 10 MG PO TABS: 10.0000 mg | ORAL_TABLET | Freq: Three times a day (TID) | ORAL | 0 refills | Status: DC | PRN

## 2017-05-19 MED ORDER — TRAMADOL HCL 50 MG PO TABS: 50.0000 mg | ORAL_TABLET | Freq: Four times a day (QID) | ORAL | 0 refills | Status: DC | PRN

## 2017-05-19 NOTE — ED Provider Notes (Signed)
[facility] Provider Note   CSN: [id number] Arrival date & time: 05/19/17  0608     History   Chief Complaint Chief Complaint  Patient presents with  . Back Pain    HPI Shirley [name] is a 41 y.o. female.  The history is provided by the patient.  Back Pain    She complains of pain throughout her lower and middle back for about the last 2 weeks. Pain started after she began a new job. There is occasional radiation of pain down her right leg. There is intermittent numbness in her feet-especially when she is driving. No bowel or bladder incontinence. She rates pain at 6/10 currently. She has taken her Friday of over-the-counter medications and-both oral and topical without any relief. She does have history of back problems previously. Also, yesterday, she developed nausea and diarrhea. She had approximately 5 watery bowel movements. She denies fever or chills. She denies abdominal pain. Today, she continues to have nausea, but does not feel like she is going to have more diarrhea. There were no known sick contacts, and no unusual foods eaten recently.  Past Medical History:  Diagnosis Date  . ADHD (attention deficit hyperactivity disorder)   . ALLERGIC RHINITIS 06/26/2008  . ANEMIA-IRON DEFICIENCY 06/26/2008  . 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  . ANKLE PAIN, LEFT 06/26/2008  . BACK PAIN, LUMBAR 10/15/2007  . BIPOLAR DISORDER UNSPECIFIED 09/13/2007  . CERVICAL RADICULOPATHY, LEFT 06/26/2008  . Cervicalgia 10/15/2007  . Chronic pain syndrome 03/21/2011  . COMMON MIGRAINE 06/26/2008  . DIABETES MELLITUS, TYPE II 06/05/2007  . DYSPNEA 12/05/2007  . EAR PAIN, RIGHT 12/05/2007  . ELEVATED BP READING WITHOUT DX HYPERTENSION 07/29/2009  . FATIGUE 12/05/2007  . FIBROMYALGIA 06/26/2008  . GASTROENTERITIS, ACUTE 12/15/2008  . GERD 06/26/2008  . Headache(784.0) 09/13/2007  . HYPERLIPIDEMIA 09/13/2007  . JOINT EFFUSION, RIGHT KNEE 07/29/2009  . KNEE PAIN, LEFT 11/21/2010  . LUMBAR RADICULOPATHY, LEFT  06/26/2008  . OTHER DISEASE OF PHARYNX OR NASOPHARYNX 07/03/2008  . PERIPHERAL EDEMA 01/06/2009  . POLYARTHRITIS 11/21/2010  . SHOULDER PAIN, BILATERAL 07/03/2008  . SINUSITIS- ACUTE-NOS 07/29/2009  . TACHYCARDIA 12/05/2007  . THYROID NODULE, RIGHT 11/20/2008  . TREMOR 01/02/2008  . Vertigo     Patient Active Problem List   Diagnosis Date Noted  . MRSA (methicillin resistant staph aureus) urine culture positive on 07/06/16 07/06/2016  . Foot injury 12/20/2015  . Bilateral thoracic back pain 12/20/2015  . Lower back pain 12/01/2015  . Left shoulder pain 11/07/2013  . Left flank pain 11/07/2013  . Encounter for long-term (current) use of high-risk medication 05/29/2011  . Preventative health care 05/28/2011  . Chronic pain syndrome 03/21/2011  . KNEE PAIN, LEFT 11/21/2010  . POLYARTHRITIS 11/21/2010  . BACK PAIN 11/22/2009  . JOINT EFFUSION, RIGHT KNEE 07/29/2009  . ELEVATED BP READING WITHOUT DX HYPERTENSION 07/29/2009  . 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  . PERIPHERAL EDEMA 01/06/2009  . GASTROENTERITIS, ACUTE 12/15/2008  . DYSPHAGIA UNSPECIFIED 11/23/2008  . THYROID NODULE, RIGHT 11/20/2008  . SHOULDER PAIN, BILATERAL 07/03/2008  . ANEMIA-IRON DEFICIENCY 06/26/2008  . COMMON MIGRAINE 06/26/2008  . ALLERGIC RHINITIS 06/26/2008  . GERD 06/26/2008  . ANKLE PAIN, LEFT 06/26/2008  . CERVICAL RADICULOPATHY, LEFT 06/26/2008  . LUMBAR RADICULOPATHY, LEFT 06/26/2008  . FIBROMYALGIA 06/26/2008  . TREMOR 01/02/2008  . EAR PAIN, RIGHT 12/05/2007  . FATIGUE 12/05/2007  . TACHYCARDIA 12/05/2007  . DYSPNEA 12/05/2007  . Cervicalgia 10/15/2007  . BACK PAIN, LUMBAR 10/15/2007  . Pain in Soft Tissues of Limb  10/15/2007  . Hyperlipidemia 09/13/2007  . BIPOLAR DISORDER UNSPECIFIED 09/13/2007  . Headache(784.0) 09/13/2007  . Diabetes ([place]) 06/05/2007    Past Surgical History:  Procedure Laterality Date  . ABDOMINAL HYSTERECTOMY     fibroids  . back surgury     lumbar 2001  . CESAREAN SECTION      x 3  . thyroid fine needle aspiration  May 2008   showed non neoplastic goiter    OB History    Gravida Para Term Preterm AB Living   3 3 2 1   3    SAB TAB Ectopic Multiple Live Births           3       Home Medications    Prior to Admission medications   Medication Sig Start Date End Date Taking? Authorizing Provider  calcium-vitamin D (OSCAL WITH D) 500-200 MG-UNIT tablet Take 1 tablet by mouth.    [provider]  cetirizine (ZYRTEC) 10 MG tablet Take 1 tablet (10 mg total) by mouth daily. 08/31/16   Gloriann Loan, PA-C  cyclobenzaprine (FLEXERIL) 10 MG tablet Take 1 tablet (10 mg total) by mouth 2 (two) times daily as needed for muscle spasms. 12/01/16   Quintella Reichert, MD  fluticasone [facility]) 50 MCG/ACT nasal spray Place 2 sprays into both nostrils daily. 08/31/16   Gloriann Loan, PA-C  ibuprofen (ADVIL,MOTRIN) 800 MG tablet Take 1 tablet (800 mg total) by mouth 3 (three) times daily. 12/01/16   Quintella Reichert, MD  Multiple Vitamins-Minerals (MULTIVITAMIN WITH MINERALS) tablet Take 1 tablet by mouth daily.    [provider]  Probiotic Product (PROBIOTIC PO) Take 1 tablet by mouth as needed.     [provider]  Pseudoephedrine-Naproxen Na (ALEVE-D SINUS & COLD) 120-220 MG TB12 Take 1 tablet by mouth as needed.     [provider]    Family History Family History  Problem Relation Age of Onset  . Thyroid disease Mother   . Diabetes Mother   . Asthma Mother   . Hypertension Father   . Hyperlipidemia Father   . Diabetes Father   . Emphysema Other   . Coronary artery disease Other   . Cancer Other        pancreatic and breast cancer  . Cancer Other        lung and prostate cancer    Social History Social History  Substance Use Topics  . Smoking status: Never Smoker  . Smokeless tobacco: Never Used  . Alcohol use No     Allergies   Sulfa antibiotics; Contrast media [iodinated diagnostic agents]; Promethazine hcl; and Tdap  [diphth-acell pertussis-tetanus]   Review of Systems Review of Systems  Musculoskeletal: Positive for back pain.  All other systems reviewed and are negative.    Physical Exam Updated Vital Signs BP (!) 142/98 (BP Location: Left Arm)   Pulse (!) 103   Temp 98.3 F (36.8 C) (Oral)   Resp 18   Ht 5\' 5"  (1.651 m)   Wt 90.7 kg (200 lb)   SpO2 99%   BMI 33.28 kg/m   Physical Exam  Nursing note and vitals reviewed.  41 year old female, resting comfortably and in no acute distress. Vital signs are significant for hypertension, borderline tachycardia. Oxygen saturation is 99%, which is normal. Head is normocephalic and atraumatic. PERRLA, EOMI. Oropharynx is clear. Neck is nontender and supple without adenopathy or JVD. Back is nontender and there is no CVA tenderness. Straight leg raise is  negative. There is bilateral paralumbar spasm which is worse on the left. Lungs are clear without rales, wheezes, or rhonchi. Chest is nontender. Heart has regular rate and rhythm without murmur. Abdomen is soft, flat, nontender without masses or hepatosplenomegaly and peristalsis is hypoactive. Extremities have no cyanosis or edema, full range of motion is present. Skin is warm and dry without rash. Neurologic: Mental status is normal, cranial nerves are intact, there are no motor or sensory deficits.  ED Treatments / Results  Labs (all labs ordered are listed, but only abnormal results are displayed) Labs Reviewed  URINALYSIS, ROUTINE W REFLEX MICROSCOPIC - Abnormal; Notable for the following:       Result Value   Specific Gravity, Urine 1.031 (*)    Glucose, UA >=500 (*)    Ketones, ur 15 (*)    All other components within normal limits  BASIC METABOLIC PANEL - Abnormal; Notable for the following:    Sodium 133 (*)    Glucose, Bld 351 (*)    Calcium 8.8 (*)    All other components within normal limits  URINALYSIS, MICROSCOPIC (REFLEX) - Abnormal; Notable for the following:     Bacteria, UA RARE (*)    Squamous Epithelial / LPF 0-5 (*)    All other components within normal limits  CBC WITH DIFFERENTIAL/PLATELET  CBG MONITORING, ED    Procedures Procedures (including critical care time)  Medications Ordered in ED Medications  ondansetron (ZOFRAN) injection 4 mg (not administered)  sodium chloride 0.9 % bolus 1,000 mL (not administered)  ketorolac (TORADOL) 30 MG/ML injection 30 mg (not administered)  cyclobenzaprine (FLEXERIL) tablet 10 mg (not administered)     Initial Impression / Assessment and Plan / ED Course  I have reviewed the triage vital signs and the nursing notes.  Pertinent labs & imaging results that were available during my care of the patient were reviewed by me and considered in my medical decision making (see chart for details).  Diarrhea which is presumed infectious-probably viral. Persistent nausea from the same infection. Low-back pain which is an exacerbation of a pre-existing problem. Old records are reviewed confirming prior evaluation and treatment for back pain. Most recent MRI was in 2010. She'll be given IV fluids, IV ketorolac, IV ondansetron, oral cyclobenzaprine. Will check electrolytes and urinalysis.  She feels significantly better after above noted treatment. Laboratory workup was significant for hyperglycemia with glucose 351. On review of her past records, she has had hyperglycemia for the last 10 years. I asked the patient about this, and she had been on medication for diabetes, but lost weight and was taken off of the medication. She then stopped checking her sugars. Currently, she is unable to see her PCP because of insurance issues. She is discharged with prescription for metformin as well as cyclobenzaprine and tramadol. Prescription for metoclopramide for nausea. Advised to use over-the-counter naproxen. Advised to use ice. Follow-up with PCP, but also given financial resource guide.    Final Clinical Impressions(s) /  ED Diagnoses   Final diagnoses:  Diarrhea of presumed infectious origin  Nausea  Bilateral low back pain with right-sided sciatica, unspecified chronicity  Type 2 diabetes mellitus without complication, without long-term current use of insulin (HCC)    New Prescriptions New Prescriptions   CYCLOBENZAPRINE (FLEXERIL) 10 MG TABLET    Take 1 tablet (10 mg total) by mouth 3 (three) times daily as needed for muscle spasms.   METFORMIN (GLUCOPHAGE) 500 MG TABLET    Take 1 tablet (500 mg  total) by mouth 2 (two) times daily with a meal.   METOCLOPRAMIDE (REGLAN) 10 MG TABLET    Take 1 tablet (10 mg total) by mouth every 6 (six) hours as needed for nausea (or headache).   TRAMADOL (ULTRAM) 50 MG TABLET    Take 1 tablet (50 mg total) by mouth every 6 (six) hours as needed.     Delora Fuel, MD 46/21/94 [phone]

## 2017-05-19 NOTE — ED Notes (Signed)
EDP into room 

## 2017-05-19 NOTE — Discharge Instructions (Signed)
Take two naproxen tablets at a time, twice a day. Apply ice to your back several times a day.  Take loperamide (Imodium AD) as needed for diarrhea.

## 2017-05-19 NOTE — ED Notes (Addendum)
C/o back pain, onset 2 weeks ago, h/o back pain with surgery, R=L, top to bottom, also nausea and diarrhea after breakfast yesterday x5, no diarrhea since yesterday, (denies: fever, vomiting, dizziness, urinary sx, vaginal sx, bleeding, or radiation). Last ate bugles this morning at 0515, last BM yesterday. Rates pain 5/10, no relief with: ibuprofen, Doans, tylenol, tumeic, meloxicam, herbal remedies. Back surgery in 2001.   Alert, NAD, calm, interactive, resps e/u, speaking in clear complete sentences, no dyspnea noted, skin W&D, VSS. EDP into room.

## 2017-08-06 ENCOUNTER — Encounter (HOSPITAL_COMMUNITY): Payer: Self-pay | Admitting: *Deleted

## 2017-08-06 ENCOUNTER — Ambulatory Visit (HOSPITAL_COMMUNITY)
Admission: EM | Admit: 2017-08-06 | Discharge: 2017-08-06 | Disposition: A | Discharge status: Home / Self Care | Payer: Medicaid Other | Attending: Emergency Medicine | Admitting: Emergency Medicine

## 2017-08-06 DIAGNOSIS — S29012A Strain of muscle and tendon of back wall of thorax, initial encounter: Secondary | ICD-10-CM | POA: Diagnosis not present

## 2017-08-06 DIAGNOSIS — S39012A Strain of muscle, fascia and tendon of lower back, initial encounter: Secondary | ICD-10-CM | POA: Diagnosis not present

## 2017-08-06 DIAGNOSIS — T148XXA Other injury of unspecified body region, initial encounter: Secondary | ICD-10-CM | POA: Diagnosis not present

## 2017-08-06 DIAGNOSIS — M778 Other enthesopathies, not elsewhere classified: Secondary | ICD-10-CM

## 2017-08-06 DIAGNOSIS — M779 Enthesopathy, unspecified: Secondary | ICD-10-CM

## 2017-08-06 MED ORDER — CYCLOBENZAPRINE HCL 5 MG PO TABS: 5.0000 mg | ORAL_TABLET | Freq: Three times a day (TID) | ORAL | 0 refills | Status: DC | PRN

## 2017-08-06 MED ORDER — PREDNISONE 10 MG (21) PO TBPK: ORAL_TABLET | ORAL | 0 refills | Status: DC

## 2017-08-06 MED ORDER — TRAMADOL HCL 50 MG PO TABS: 50.0000 mg | ORAL_TABLET | Freq: Four times a day (QID) | ORAL | 0 refills | Status: DC | PRN

## 2017-08-06 NOTE — ED Provider Notes (Signed)
[facility]    CSN: [id number] Arrival date & time: 08/06/17  1008     History   Chief Complaint Chief Complaint  Patient presents with  . Back Pain  . Hand Pain    HPI Shirley [name] is a 41 y.o. female.   41 year old female complaining of acute on chronic back pain. The pain is primarily in the low back She has a history of disc disease in which she had surgeryin 2001. In the past couple days she has had acute on chronic pain, mostly constant but occasionally will improve with certain positions. Pain is located across low back and radiates down both thighs. When the patient stood up and point to the areas of pain she pointed to the upper, mid and lower back musculature. 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 Denies known injury, fall or blunt trauma. Denies paresthesias or focal weakness.  Second complaint is left left index finger sore. It is worse over the PIP. The job explained above also requires repetitive use of the digits and hands. Now she works both her back and finger gets sore.      Past Medical History:  Diagnosis Date  . ADHD (attention deficit hyperactivity disorder)   . ALLERGIC RHINITIS 06/26/2008  . ANEMIA-IRON DEFICIENCY 06/26/2008  . 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  . ANKLE PAIN, LEFT 06/26/2008  . BACK PAIN, LUMBAR 10/15/2007  . BIPOLAR DISORDER UNSPECIFIED 09/13/2007  . CERVICAL RADICULOPATHY, LEFT 06/26/2008  . Cervicalgia 10/15/2007  . Chronic pain syndrome 03/21/2011  . COMMON MIGRAINE 06/26/2008  . DIABETES MELLITUS, TYPE II 06/05/2007  . DYSPNEA 12/05/2007  . EAR PAIN, RIGHT 12/05/2007  . ELEVATED BP READING WITHOUT DX HYPERTENSION 07/29/2009  . FATIGUE 12/05/2007  . FIBROMYALGIA 06/26/2008  . GASTROENTERITIS, ACUTE 12/15/2008  . GERD 06/26/2008  . Headache(784.0) 09/13/2007  . HYPERLIPIDEMIA 09/13/2007  . JOINT EFFUSION, RIGHT KNEE 07/29/2009  .  KNEE PAIN, LEFT 11/21/2010  . LUMBAR RADICULOPATHY, LEFT 06/26/2008  . OTHER DISEASE OF PHARYNX OR NASOPHARYNX 07/03/2008  . PERIPHERAL EDEMA 01/06/2009  . POLYARTHRITIS 11/21/2010  . SHOULDER PAIN, BILATERAL 07/03/2008  . SINUSITIS- ACUTE-NOS 07/29/2009  . TACHYCARDIA 12/05/2007  . THYROID NODULE, RIGHT 11/20/2008  . TREMOR 01/02/2008  . Vertigo     Patient Active Problem List   Diagnosis Date Noted  . MRSA (methicillin resistant staph aureus) urine culture positive on 07/06/16 07/06/2016  . Foot injury 12/20/2015  . Bilateral thoracic back pain 12/20/2015  . Lower back pain 12/01/2015  . Left shoulder pain 11/07/2013  . Left flank pain 11/07/2013  . Encounter for long-term (current) use of high-risk medication 05/29/2011  . Preventative health care 05/28/2011  . Chronic pain syndrome 03/21/2011  . KNEE PAIN, LEFT 11/21/2010  . POLYARTHRITIS 11/21/2010  . BACK PAIN 11/22/2009  . JOINT EFFUSION, RIGHT KNEE 07/29/2009  . ELEVATED BP READING WITHOUT DX HYPERTENSION 07/29/2009  . 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  . PERIPHERAL EDEMA 01/06/2009  . GASTROENTERITIS, ACUTE 12/15/2008  . DYSPHAGIA UNSPECIFIED 11/23/2008  . THYROID NODULE, RIGHT 11/20/2008  . SHOULDER PAIN, BILATERAL 07/03/2008  . ANEMIA-IRON DEFICIENCY 06/26/2008  . COMMON MIGRAINE 06/26/2008  . ALLERGIC RHINITIS 06/26/2008  . GERD 06/26/2008  . ANKLE PAIN, LEFT 06/26/2008  . CERVICAL RADICULOPATHY, LEFT 06/26/2008  . LUMBAR RADICULOPATHY, LEFT 06/26/2008  . FIBROMYALGIA 06/26/2008  . TREMOR 01/02/2008  . EAR PAIN, RIGHT 12/05/2007  .  FATIGUE 12/05/2007  . TACHYCARDIA 12/05/2007  . DYSPNEA 12/05/2007  . Cervicalgia 10/15/2007  . BACK PAIN, LUMBAR 10/15/2007  . Pain in Soft Tissues of Limb 10/15/2007  . Hyperlipidemia 09/13/2007  . BIPOLAR DISORDER UNSPECIFIED 09/13/2007  . Headache(784.0) 09/13/2007  . Diabetes ([place]) 06/05/2007    Past Surgical History:  Procedure Laterality Date  . ABDOMINAL HYSTERECTOMY     fibroids  .  back surgury     lumbar 2001  . CESAREAN SECTION     x 3  . thyroid fine needle aspiration  May 2008   showed non neoplastic goiter    OB History    Gravida Para Term Preterm AB Living   3 3 2 1   3    SAB TAB Ectopic Multiple Live Births           3       Home Medications    Prior to Admission medications   Medication Sig Start Date End Date Taking? Authorizing Provider  cetirizine (ZYRTEC) 10 MG tablet Take 1 tablet (10 mg total) by mouth daily. 08/31/16  Yes Gloriann Loan, PA-C  calcium-vitamin D (OSCAL WITH D) 500-200 MG-UNIT tablet Take 1 tablet by mouth.    [provider]  cyclobenzaprine (FLEXERIL) 5 MG tablet Take 1 tablet (5 mg total) by mouth 3 (three) times daily as needed for muscle spasms. 08/06/17   Janne Napoleon, NP  fluticasone (FLONASE) 50 MCG/ACT nasal spray Place 2 sprays into both nostrils daily. 08/31/16   Gloriann Loan, PA-C  ibuprofen (ADVIL,MOTRIN) 800 MG tablet Take 1 tablet (800 mg total) by mouth 3 (three) times daily. 12/01/16   Quintella Reichert, MD  metFORMIN (GLUCOPHAGE) 500 MG tablet Take 1 tablet (500 mg total) by mouth 2 (two) times daily with a meal. 8/67/61   Delora Fuel, MD  metoCLOPramide (REGLAN) 10 MG tablet Take 1 tablet (10 mg total) by mouth every 6 (six) hours as needed for nausea (or headache). 9/50/93   Delora Fuel, MD  Multiple Vitamins-Minerals (MULTIVITAMIN WITH MINERALS) tablet Take 1 tablet by mouth daily.    [provider]  predniSONE (STERAPRED UNI-PAK 21 TAB) 10 MG (21) TBPK tablet Dispense one 6 day pack. Take as directed with food. 08/06/17   Janne Napoleon, NP  Probiotic Product (PROBIOTIC PO) Take 1 tablet by mouth as needed.     [provider]  Pseudoephedrine-Naproxen Na (ALEVE-D SINUS & COLD) 120-220 MG TB12 Take 1 tablet by mouth as needed.     [provider]  traMADol (ULTRAM) 50 MG tablet Take 1 tablet (50 mg total) by mouth every 6 (six) hours as needed. 08/06/17   Janne Napoleon, NP    Family  History Family History  Problem Relation Age of Onset  . Thyroid disease Mother   . Diabetes Mother   . Asthma Mother   . Hypertension Father   . Hyperlipidemia Father   . Diabetes Father   . Emphysema Other   . Coronary artery disease Other   . Cancer Other        pancreatic and breast cancer  . Cancer Other        lung and prostate cancer    Social History Social History  Substance Use Topics  . Smoking status: Never Smoker  . Smokeless tobacco: Never Used  . Alcohol use No     Allergies   Sulfa antibiotics; Contrast media [iodinated diagnostic agents]; Promethazine hcl; and Tdap [diphth-acell pertussis-tetanus]   Review of Systems Review of Systems  Constitutional: Negative for activity change, chills and fever.  HENT: Negative.   Respiratory: Negative.   Cardiovascular: Negative.   Musculoskeletal: Positive for arthralgias, back pain and myalgias. Negative for neck pain and neck stiffness.       As per HPI  Skin: Negative for color change, pallor and rash.  Neurological: Negative.   All other systems reviewed and are negative.    Physical Exam Triage Vital Signs ED Triage Vitals [08/06/17 1040]  Enc Vitals Group     BP (!) 125/94     Pulse Rate (!) 119     Resp 17     Temp 98 F (36.7 C)     Temp Source Oral     SpO2 100 %     Weight      Height      Head Circumference      Peak Flow      Pain Score 6     Pain Loc      Pain Edu?      Excl. in [place]?    No data found.   Updated Vital Signs BP (!) 125/94 (BP Location: Left Arm)   Pulse (!) 119   Temp 98 F (36.7 C) (Oral)   Resp 17   SpO2 100%   Visual Acuity Right Eye Distance:   Left Eye Distance:   Bilateral Distance:    Right Eye Near:   Left Eye Near:    Bilateral Near:     Physical Exam  Constitutional: She is oriented to person, place, and time. She appears well-developed and well-nourished. No distress.  HENT:  Head: Normocephalic and atraumatic.  Eyes: EOM are normal.    Neck: Normal range of motion. Neck supple.  Cardiovascular: Normal rate.   Pulmonary/Chest: Effort normal.  Musculoskeletal:  Marked tenderness to the upper or thoracic musculature as well as the scapular muscles. This is exacerbated by having her follow your arms in front and lean forward. As this stretches the involved muscles pain increases. There is tenderness along the para thoracic, paralumbar muscles. No spinal tenderness in these areas except where she had surgery years ago which is always tender. No new symptoms there. There is tenderness to the buttock muscles bilaterally. No weakness observed. Strength is 5 over 5. Distal sensation is intact.  Left index finger without swelling but with tenderness over the PIP joint. Flexion about 90% before there is pain in the joint. No erythema or deformity. Tenderness across the dorsal aspect/extensor surface of the index finger.  Lymphadenopathy:    She has no cervical adenopathy.  Neurological: She is alert and oriented to person, place, and time. No cranial nerve deficit.  Skin: Skin is warm and dry. No rash noted.  Nursing note and vitals reviewed.    UC Treatments / Results  Labs (all labs ordered are listed, but only abnormal results are displayed) Labs Reviewed - No data to display  EKG  EKG Interpretation None       Radiology No results found.  Procedures Procedures (including critical care time)  Medications Ordered in UC Medications - No data to display   Initial Impression / Assessment and Plan / UC Course  I have reviewed the triage vital signs and the nursing notes.  Pertinent labs & imaging results that were available during my care of the patient were reviewed by me and considered in my medical decision making (see chart for details).    Apply heat as we discussed. Stretches as demonstrated. Due  to these gently and then more a rash you are able. Perform them before during and after work if possible.  Medications as directed, the muscle relaxants may cause drowsiness. Wear your finger splint while you are at work and most other times although you may remove it periodically.    Final Clinical Impressions(s) / UC Diagnoses   Final diagnoses:  Muscle strain of upper back  Strain of lumbar region, initial encounter  Muscle strain  Tendinitis of finger of left hand    New Prescriptions New Prescriptions   CYCLOBENZAPRINE (FLEXERIL) 5 MG TABLET    Take 1 tablet (5 mg total) by mouth 3 (three) times daily as needed for muscle spasms.   PREDNISONE (STERAPRED UNI-PAK 21 TAB) 10 MG (21) TBPK TABLET    Dispense one 6 day pack. Take as directed with food.   TRAMADOL (ULTRAM) 50 MG TABLET    Take 1 tablet (50 mg total) by mouth every 6 (six) hours as needed.     Controlled Substance Prescriptions [place] Controlled Substance Registry consulted? Yes, I have consulted the [place] Controlled Substances Registry for this patient, and feel the risk/benefit ratio today is favorable for proceeding with this prescription for a controlled substance.   Janne Napoleon, NP 08/06/17 1156

## 2017-08-06 NOTE — ED Triage Notes (Signed)
Patient reports intermittent back pain x unknown period of time with acute episode x 2 days. Patients reports mid back pain that radiates down bilateral legs that prevented her from being able to sleep and go to work today. Patient describes it as nerve pain down legs.   Patient also has left pointer finger stiffness and pain. Patient reports severe discomfort to middle joint of finger.

## 2017-08-06 NOTE — Discharge Instructions (Signed)
Apply heat as we discussed. Stretches as demonstrated. Due to these gently and then more a rash you are able. Perform them before during and after work if possible. Medications as directed, the muscle relaxants may cause drowsiness. Wear your finger splint while you are at work and most other times although you may remove it periodically.

## 2017-09-03 ENCOUNTER — Ambulatory Visit (HOSPITAL_COMMUNITY)
Admission: EM | Admit: 2017-09-03 | Discharge: 2017-09-03 | Disposition: A | Discharge status: Home / Self Care | Payer: Medicaid Other | Attending: Emergency Medicine | Admitting: Emergency Medicine

## 2017-09-03 ENCOUNTER — Encounter (HOSPITAL_COMMUNITY): Payer: Self-pay | Admitting: Emergency Medicine

## 2017-09-03 DIAGNOSIS — M62830 Muscle spasm of back: Secondary | ICD-10-CM | POA: Diagnosis not present

## 2017-09-03 DIAGNOSIS — M549 Dorsalgia, unspecified: Secondary | ICD-10-CM | POA: Diagnosis not present

## 2017-09-03 MED ORDER — METHOCARBAMOL 750 MG PO TABS: 750.0000 mg | ORAL_TABLET | ORAL | 0 refills | Status: DC

## 2017-09-03 MED ORDER — DICLOFENAC SODIUM 75 MG PO TBEC: 75.0000 mg | DELAYED_RELEASE_TABLET | Freq: Two times a day (BID) | ORAL | 0 refills | Status: DC

## 2017-09-03 MED ORDER — HYDROCODONE-ACETAMINOPHEN 5-325 MG PO TABS: 1.0000 | ORAL_TABLET | Freq: Four times a day (QID) | ORAL | 0 refills | Status: DC | PRN

## 2017-09-03 NOTE — Discharge Instructions (Signed)
Take the diclofenac with 1 g of Tylenol twice a day on a regular basis for the next several days.  May take an additional gram of Tylenol 2 more times a day.  Norco for severe pain only.  Do not take Norco and Tylenol as they both have Tylenol in them and too much Tylenol can hurt your liver.  Each tablet of Norco has 325 mg of Tylenol in it.  Do not exceed 4 g of Tylenol from all sources in 1 day.  Many people find gentle stretching and deep tissue massage helpful. Try [facility] on [address]. They have very reasonable prices and take walk ins. Or you can go to  [facility]. Follow-up with your primary care physician in several days, go to the ER for the signs and symptoms we discussed.  Go to www.goodrx.com to look up your medications. This will give you a list of where you can find your prescriptions at the most affordable prices. Or ask the pharmacist what the cash price is, or if they have any other discount programs available to help make your medication more affordable. This can be less expensive than what you would pay with insurance.

## 2017-09-03 NOTE — ED Provider Notes (Signed)
HPI  SUBJECTIVE:  Shirley [name] is a 41 y.o. female who presents with constant mid upper back pain described as spasms starting yesterday after sitting in an awkward position without support for 20 minutes on a couch.  She reports nerve pain in both her arms and legs, describes it as sharp, shooting, constant.  She has had this exact radicular/nerve pain the same location , states that it is more intense than usual.  Denies arm or grip weakness.  She denies radiation into her hands.  Denies leg weakness.  She tried leftover prescription of Flexeril and Aleve PM with some improvement in her symptoms.  No aggravating factors.  She denies urinary or fecal incontinence, urinary retention, saddle anesthesia, fevers, urinary complaints.  The patient has a past medical history of diabetes, preeclampsia, fibromyalgia, chronic back pain status post back surgery, osteoarthritis.  No history of kidney disease, chronic opiate use.  LMP: 2004 status post hysterectomy.  OQH:UTML, Hunt Oris, MD   Patient was seen here back in October for low back pain with radiation down both thighs.  She had diffuse tenderness over her entire back.  Was sent home with Flexeril, prednisone and tramadol.   Past Medical History:  Diagnosis Date  . ADHD (attention deficit hyperactivity disorder)   . ALLERGIC RHINITIS 06/26/2008  . ANEMIA-IRON DEFICIENCY 06/26/2008  . 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  . ANKLE PAIN, LEFT 06/26/2008  . BACK PAIN, LUMBAR 10/15/2007  . BIPOLAR DISORDER UNSPECIFIED 09/13/2007  . CERVICAL RADICULOPATHY, LEFT 06/26/2008  . Cervicalgia 10/15/2007  . Chronic pain syndrome 03/21/2011  . COMMON MIGRAINE 06/26/2008  . DIABETES MELLITUS, TYPE II 06/05/2007  . DYSPNEA 12/05/2007  . EAR PAIN, RIGHT 12/05/2007  . ELEVATED BP READING WITHOUT DX HYPERTENSION 07/29/2009  . FATIGUE 12/05/2007  . FIBROMYALGIA 06/26/2008  . GASTROENTERITIS, ACUTE 12/15/2008  . GERD 06/26/2008  . Headache(784.0) 09/13/2007  . HYPERLIPIDEMIA 09/13/2007  .  JOINT EFFUSION, RIGHT KNEE 07/29/2009  . KNEE PAIN, LEFT 11/21/2010  . LUMBAR RADICULOPATHY, LEFT 06/26/2008  . OTHER DISEASE OF PHARYNX OR NASOPHARYNX 07/03/2008  . PERIPHERAL EDEMA 01/06/2009  . POLYARTHRITIS 11/21/2010  . SHOULDER PAIN, BILATERAL 07/03/2008  . SINUSITIS- ACUTE-NOS 07/29/2009  . TACHYCARDIA 12/05/2007  . THYROID NODULE, RIGHT 11/20/2008  . TREMOR 01/02/2008  . Vertigo     Past Surgical History:  Procedure Laterality Date  . ABDOMINAL HYSTERECTOMY     fibroids  . back surgury     lumbar 2001  . CESAREAN SECTION     x 3  . thyroid fine needle aspiration  May 2008   showed non neoplastic goiter    Family History  Problem Relation Age of Onset  . Thyroid disease Mother   . Diabetes Mother   . Asthma Mother   . Hypertension Father   . Hyperlipidemia Father   . Diabetes Father   . Emphysema Other   . Coronary artery disease Other   . Cancer Other        pancreatic and breast cancer  . Cancer Other        lung and prostate cancer    Social History   Tobacco Use  . Smoking status: Never Smoker  . Smokeless tobacco: Never Used  Substance Use Topics  . Alcohol use: No  . Drug use: No    No current facility-administered medications for this encounter.   Current Outpatient Medications:  .  calcium-vitamin D (OSCAL WITH D) 500-200 MG-UNIT tablet, Take 1 tablet by mouth., Disp: , Rfl:  .  cetirizine (ZYRTEC) 10 MG tablet, Take 1 tablet (10 mg total) by mouth daily., Disp: 30 tablet, Rfl: 0 .  diclofenac (VOLTAREN) 75 MG EC tablet, Take 1 tablet (75 mg total) 2 (two) times daily by mouth. Take with food, Disp: 30 tablet, Rfl: 0 .  fluticasone (FLONASE) 50 MCG/ACT nasal spray, Place 2 sprays into both nostrils daily., Disp: 16 g, Rfl: 0 .  HYDROcodone-acetaminophen (NORCO/VICODIN) 5-325 MG tablet, Take 1-2 tablets every 6 (six) hours as needed by mouth for moderate pain., Disp: 12 tablet, Rfl: 0 .  metFORMIN (GLUCOPHAGE) 500 MG tablet, Take 1 tablet (500 mg total)  by mouth 2 (two) times daily with a meal., Disp: 60 tablet, Rfl: 0 .  methocarbamol (ROBAXIN) 750 MG tablet, Take 1 tablet (750 mg total) every 4 (four) hours by mouth., Disp: 40 tablet, Rfl: 0 .  metoCLOPramide (REGLAN) 10 MG tablet, Take 1 tablet (10 mg total) by mouth every 6 (six) hours as needed for nausea (or headache)., Disp: 30 tablet, Rfl: 0 .  Multiple Vitamins-Minerals (MULTIVITAMIN WITH MINERALS) tablet, Take 1 tablet by mouth daily., Disp: , Rfl:  .  Probiotic Product (PROBIOTIC PO), Take 1 tablet by mouth as needed. , Disp: , Rfl:   Allergies  Allergen Reactions  . Sulfa Antibiotics Anaphylaxis  . Contrast Media [Iodinated Diagnostic Agents] Itching    Mri, severe heaving ~ can take benadryl without reaction  . Promethazine Hcl     Iv dose with benadryl causes severe aggrivation  . Tdap [Diphth-Acell Pertussis-Tetanus] Hives, Itching, Swelling, Anxiety and Rash     ROS  As noted in HPI.   Physical Exam  BP (!) 151/90 (BP Location: Right Arm)   Pulse (!) 115   Temp 97.9 F (36.6 C) (Oral)   Resp 18   SpO2 97%   Constitutional: Well developed, well nourished, no acute distress Eyes:  EOMI, conjunctiva normal bilaterally HENT: Normocephalic, atraumatic,mucus membranes moist Respiratory: Normal inspiratory effort Cardiovascular: Normal rate GI: nondistended. No suprapubic or flank tenderness skin: No rash, skin intact Musculoskeletal: no CVAT. + Rhomboid and paralumbar tenderness bilaterally, positive muscle spasm.  Positive T and L-spine tenderness, patient states this is normal for her.. Bilateral lower extremities nontender, baseline ROM with intact PT pulses. No pain with int/ext rotation flex/extension hips bilaterally. SLR neg bilaterally. Sensation baseline light touch bilaterally for Pt, DTR's symmetric and intact bilaterally KJ , Motor symmetric bilateral 5/5 hip flexion, quadriceps, hamstrings, EHL, foot dorsiflexion, foot plantarflexion, gait normal.  Patient  sensation and motor neurovascularly intact in the median/radial/ulnar distribution, she is moving both arms equally with 5/5 strength, grip 5/5 and equal bilaterally. Neurologic: Alert & oriented x 3, no focal neuro deficits Psychiatric: Speech and behavior appropriate   ED Course   Medications - No data to display  No orders of the defined types were placed in this encounter.   No results found for this or any previous visit (from the past 24 hour(s)). No results found.  ED Clinical Impression  Acute bilateral back pain, unspecified back location  Muscle spasm of back   ED Assessment/Plan  [facility]arcotic database reviewed for this patient, and feel that the risk/benefit ratio today is favorable for proceeding with a prescription for controlled substance.  2 opiate prescriptions in the past 2 years.  Previous records reviewed.  As noted in HPI  She is describing typical pain after sitting in an awkward position.  She has no red flags on history.  Deferring imaging today.  Discontinuing Flexeril,  Aleve.  Plan to send home with Robaxin, diclofenac/Tylenol combination, advised deep tissue massage and a short course of Norco.  Follow-up with PMD as needed, to the ER if she gets worse.  Discussed MDM, plan and followup with patient. Discussed sn/sx that should prompt return to the ED. patient agrees with plan.   Meds ordered this encounter  Medications  . HYDROcodone-acetaminophen (NORCO/VICODIN) 5-325 MG tablet    Sig: Take 1-2 tablets every 6 (six) hours as needed by mouth for moderate pain.    Dispense:  12 tablet    Refill:  0  . diclofenac (VOLTAREN) 75 MG EC tablet    Sig: Take 1 tablet (75 mg total) 2 (two) times daily by mouth. Take with food    Dispense:  30 tablet    Refill:  0  . methocarbamol (ROBAXIN) 750 MG tablet    Sig: Take 1 tablet (750 mg total) every 4 (four) hours by mouth.    Dispense:  40 tablet    Refill:  0    *This clinic note was created using  [employer]. Therefore, there may be occasional mistakes despite careful proofreading.  ?    Melynda Ripple, MD 09/03/17 1201

## 2017-09-03 NOTE — ED Triage Notes (Signed)
Pt sts mid back pain after sitting in awkward position yesterday

## 2017-10-06 ENCOUNTER — Encounter (HOSPITAL_COMMUNITY): Payer: Self-pay | Admitting: Emergency Medicine

## 2017-10-06 ENCOUNTER — Ambulatory Visit (HOSPITAL_COMMUNITY)
Admission: EM | Admit: 2017-10-06 | Discharge: 2017-10-06 | Disposition: A | Discharge status: Home / Self Care | Payer: Medicaid Other | Attending: Family Medicine | Admitting: Family Medicine

## 2017-10-06 DIAGNOSIS — J0140 Acute pansinusitis, unspecified: Secondary | ICD-10-CM | POA: Diagnosis not present

## 2017-10-06 MED ORDER — FLUTICASONE PROPIONATE 50 MCG/ACT NA SUSP: 2.0000 | Freq: Every day | NASAL | 0 refills | Status: DC

## 2017-10-06 MED ORDER — FLUCONAZOLE 150 MG PO TABS
150.0000 mg | ORAL_TABLET | Freq: Every day | ORAL | 0 refills | Status: DC
Start: 2017-10-06 — End: 2018-01-10

## 2017-10-06 MED ORDER — AMOXICILLIN-POT CLAVULANATE 875-125 MG PO TABS: 1.0000 | ORAL_TABLET | Freq: Two times a day (BID) | ORAL | 0 refills | Status: DC

## 2017-10-06 NOTE — ED Triage Notes (Signed)
PT C/O: cold sx  ONSET: 1 week  SX ALSO INCLUDE: facial pressure, dizziness, nasal congestion  DENIES: fevers, chills  TAKING MEDS: OTC equate   A&O x4... NAD... Ambulatory

## 2017-10-06 NOTE — ED Provider Notes (Signed)
[facility]    CSN: [id number] Arrival date & time: 10/06/17  1314     History   Chief Complaint Chief Complaint  Patient presents with  . URI    HPI Shirley [name] is a 41 y.o. female.   41 year old female comes in for 1 week history of URI symptoms. She has had facial pressure, dizziness, nasal congestion, headache. Denies cough, sore throat, ear pain. Denies fever, chills, night sweats. otc medication with good relief. States dizziness is intermittent, worse with head movement. Denies syncope, weakness. Denies chest pain, shortness of breath, palpitations. Denies abdominal pain, nausea, vomiting.   States discontinued DM medications per PCP as she lost 75 pounds last year.      Past Medical History:  Diagnosis Date  . ADHD (attention deficit hyperactivity disorder)   . ALLERGIC RHINITIS 06/26/2008  . ANEMIA-IRON DEFICIENCY 06/26/2008  . 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  . ANKLE PAIN, LEFT 06/26/2008  . BACK PAIN, LUMBAR 10/15/2007  . BIPOLAR DISORDER UNSPECIFIED 09/13/2007  . CERVICAL RADICULOPATHY, LEFT 06/26/2008  . Cervicalgia 10/15/2007  . Chronic pain syndrome 03/21/2011  . COMMON MIGRAINE 06/26/2008  . DIABETES MELLITUS, TYPE II 06/05/2007  . DYSPNEA 12/05/2007  . EAR PAIN, RIGHT 12/05/2007  . ELEVATED BP READING WITHOUT DX HYPERTENSION 07/29/2009  . FATIGUE 12/05/2007  . FIBROMYALGIA 06/26/2008  . GASTROENTERITIS, ACUTE 12/15/2008  . GERD 06/26/2008  . Headache(784.0) 09/13/2007  . HYPERLIPIDEMIA 09/13/2007  . JOINT EFFUSION, RIGHT KNEE 07/29/2009  . KNEE PAIN, LEFT 11/21/2010  . LUMBAR RADICULOPATHY, LEFT 06/26/2008  . OTHER DISEASE OF PHARYNX OR NASOPHARYNX 07/03/2008  . PERIPHERAL EDEMA 01/06/2009  . POLYARTHRITIS 11/21/2010  . SHOULDER PAIN, BILATERAL 07/03/2008  . SINUSITIS- ACUTE-NOS 07/29/2009  . TACHYCARDIA 12/05/2007  . THYROID NODULE, RIGHT 11/20/2008  . TREMOR 01/02/2008  . Vertigo     Patient Active Problem List   Diagnosis Date Noted  . MRSA (methicillin  resistant staph aureus) urine culture positive on 07/06/16 07/06/2016  . Foot injury 12/20/2015  . Bilateral thoracic back pain 12/20/2015  . Lower back pain 12/01/2015  . Left shoulder pain 11/07/2013  . Left flank pain 11/07/2013  . Encounter for long-term (current) use of high-risk medication 05/29/2011  . Preventative health care 05/28/2011  . Chronic pain syndrome 03/21/2011  . KNEE PAIN, LEFT 11/21/2010  . POLYARTHRITIS 11/21/2010  . BACK PAIN 11/22/2009  . JOINT EFFUSION, RIGHT KNEE 07/29/2009  . ELEVATED BP READING WITHOUT DX HYPERTENSION 07/29/2009  . 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  . PERIPHERAL EDEMA 01/06/2009  . GASTROENTERITIS, ACUTE 12/15/2008  . DYSPHAGIA UNSPECIFIED 11/23/2008  . THYROID NODULE, RIGHT 11/20/2008  . SHOULDER PAIN, BILATERAL 07/03/2008  . ANEMIA-IRON DEFICIENCY 06/26/2008  . COMMON MIGRAINE 06/26/2008  . ALLERGIC RHINITIS 06/26/2008  . GERD 06/26/2008  . ANKLE PAIN, LEFT 06/26/2008  . CERVICAL RADICULOPATHY, LEFT 06/26/2008  . LUMBAR RADICULOPATHY, LEFT 06/26/2008  . FIBROMYALGIA 06/26/2008  . TREMOR 01/02/2008  . EAR PAIN, RIGHT 12/05/2007  . FATIGUE 12/05/2007  . TACHYCARDIA 12/05/2007  . DYSPNEA 12/05/2007  . Cervicalgia 10/15/2007  . BACK PAIN, LUMBAR 10/15/2007  . Pain in Soft Tissues of Limb 10/15/2007  . Hyperlipidemia 09/13/2007  . BIPOLAR DISORDER UNSPECIFIED 09/13/2007  . Headache(784.0) 09/13/2007  . Diabetes ([place]) 06/05/2007    Past Surgical History:  Procedure Laterality Date  . ABDOMINAL HYSTERECTOMY     fibroids  . back surgury     lumbar 2001  . CESAREAN SECTION     x 3  . thyroid fine needle aspiration  May 2008   showed non neoplastic goiter    OB History    Gravida Para Term Preterm AB Living   3 3 2 1   3    SAB TAB Ectopic Multiple Live Births           3       Home Medications    Prior to Admission medications   Medication Sig Start Date End Date Taking? Authorizing Provider  calcium-vitamin D (OSCAL WITH D)  500-200 MG-UNIT tablet Take 1 tablet by mouth.   Yes [provider]  cetirizine (ZYRTEC) 10 MG tablet Take 1 tablet (10 mg total) by mouth daily. 08/31/16  Yes Gloriann Loan, PA-C  diclofenac (VOLTAREN) 75 MG EC tablet Take 1 tablet (75 mg total) 2 (two) times daily by mouth. Take with food 09/03/17  Yes Melynda Ripple, MD  amoxicillin-clavulanate (AUGMENTIN) 875-125 MG tablet Take 1 tablet by mouth every 12 (twelve) hours. 10/06/17   Tasia Catchings, Riya Huxford V, PA-C  fluconazole (DIFLUCAN) 150 MG tablet Take 1 tablet (150 mg total) by mouth daily. Take second dose 72 hours later if symptoms still persists. 10/06/17   Tasia Catchings, Andersyn Fragoso V, PA-C  fluticasone (FLONASE) 50 MCG/ACT nasal spray Place 2 sprays into both nostrils daily. 10/06/17   Ok Edwards, PA-C  methocarbamol (ROBAXIN) 750 MG tablet Take 1 tablet (750 mg total) every 4 (four) hours by mouth. 09/03/17   Melynda Ripple, MD  Multiple Vitamins-Minerals (MULTIVITAMIN WITH MINERALS) tablet Take 1 tablet by mouth daily.    [provider]  Probiotic Product (PROBIOTIC PO) Take 1 tablet by mouth as needed.     [provider]    Family History Family History  Problem Relation Age of Onset  . Thyroid disease Mother   . Diabetes Mother   . Asthma Mother   . Hypertension Father   . Hyperlipidemia Father   . Diabetes Father   . Emphysema Other   . Coronary artery disease Other   . Cancer Other        pancreatic and breast cancer  . Cancer Other        lung and prostate cancer    Social History Social History   Tobacco Use  . Smoking status: Never Smoker  . Smokeless tobacco: Never Used  Substance Use Topics  . Alcohol use: No  . Drug use: No     Allergies   Sulfa antibiotics; Contrast media [iodinated diagnostic agents]; Promethazine hcl; and Tdap [diphth-acell pertussis-tetanus]   Review of Systems Review of Systems  Reason unable to perform ROS: See HPI as above.     Physical Exam Triage Vital Signs ED Triage  Vitals  Enc Vitals Group     BP 10/06/17 1327 133/84     Pulse Rate 10/06/17 1327 (!) 115     Resp 10/06/17 1327 20     Temp 10/06/17 1327 98.1 F (36.7 C)     Temp Source 10/06/17 1327 Oral     SpO2 10/06/17 1327 99 %     Weight --      Height --      Head Circumference --      Peak Flow --      Pain Score 10/06/17 1329 4     Pain Loc --      Pain Edu? --      Excl. in [place]? --    No data found.  Updated Vital Signs BP 133/84 (BP Location: Right Arm)   Pulse (!) 115  Temp 98.1 F (36.7 C) (Oral)   Resp 20   SpO2 99%   Physical Exam  Constitutional: She is oriented to person, place, and time. She appears well-developed and well-nourished. No distress.  HENT:  Head: Normocephalic and atraumatic.  Right Ear: Tympanic membrane, external ear and ear canal normal. Tympanic membrane is not erythematous and not bulging.  Left Ear: Tympanic membrane, external ear and ear canal normal. Tympanic membrane is not erythematous and not bulging.  Nose: Mucosal edema present. Right sinus exhibits maxillary sinus tenderness and frontal sinus tenderness. Left sinus exhibits maxillary sinus tenderness and frontal sinus tenderness.  Mouth/Throat: Uvula is midline, oropharynx is clear and moist and mucous membranes are normal.  Eyes: Conjunctivae are normal. Pupils are equal, round, and reactive to light.  Neck: Normal range of motion. Neck supple.  Cardiovascular: Regular rhythm and normal heart sounds. Tachycardia present. Exam reveals no gallop and no friction rub.  No murmur heard. Pulmonary/Chest: Effort normal and breath sounds normal. She has no decreased breath sounds. She has no wheezes. She has no rhonchi. She has no rales.  Lymphadenopathy:    She has no cervical adenopathy.  Neurological: She is alert and oriented to person, place, and time. She has normal strength. She is not disoriented. No cranial nerve deficit or sensory deficit. She displays a negative Romberg sign. Coordination  and gait normal. GCS eye subscore is 4. GCS verbal subscore is 5. GCS motor subscore is 6.  Skin: Skin is warm and dry.  Psychiatric: She has a normal mood and affect. Her behavior is normal. Judgment normal.     UC Treatments / Results  Labs (all labs ordered are listed, but only abnormal results are displayed) Labs Reviewed - No data to display  EKG  EKG Interpretation None       Radiology No results found.  Procedures Procedures (including critical care time)  Medications Ordered in UC Medications - No data to display   Initial Impression / Assessment and Plan / UC Course  I have reviewed the triage vital signs and the nursing notes.  Pertinent labs & imaging results that were available during my care of the patient were reviewed by me and considered in my medical decision making (see chart for details).    Augmentin for sinusitis. Patient with history of yeast with abx use, diflucan called in. Other symptomatic treatment discussed. Patient with history of tachycardia during discomfort, HR 115 today that is similar to past visits. Patient without chest pain, shortness of breath, palpitations, weakness. Will monitor for now. Return precautions given.   Final Clinical Impressions(s) / UC Diagnoses   Final diagnoses:  Acute non-recurrent pansinusitis    ED Discharge Orders        Ordered    amoxicillin-clavulanate (AUGMENTIN) 875-125 MG tablet  Every 12 hours     10/06/17 1409    fluticasone (FLONASE) 50 MCG/ACT nasal spray  Daily     10/06/17 1409    fluconazole (DIFLUCAN) 150 MG tablet  Daily     10/06/17 1411        Ok Edwards, PA-C 10/06/17 1417

## 2017-10-06 NOTE — Discharge Instructions (Signed)
Augmentin for sinus infection. Start flonase, zyrtec for nasal congestion. You can use over the counter nasal saline rinse such as neti pot for nasal congestion. Keep hydrated, your urine should be clear to pale yellow in color. Tylenol/motrin for fever and pain. Monitor for any worsening of symptoms, chest pain, shortness of breath, wheezing, swelling of the throat, follow up for reevaluation.

## 2018-01-10 ENCOUNTER — Encounter: Payer: Self-pay | Admitting: Family Medicine

## 2018-01-10 ENCOUNTER — Ambulatory Visit (INDEPENDENT_AMBULATORY_CARE_PROVIDER_SITE_OTHER): Payer: Medicaid Other | Admitting: Family Medicine

## 2018-01-10 ENCOUNTER — Other Ambulatory Visit (HOSPITAL_COMMUNITY)
Admission: RE | Admit: 2018-01-10 | Discharge: 2018-01-10 | Disposition: A | Discharge status: Home / Self Care | Payer: Medicaid Other | Source: Ambulatory Visit | Attending: Family Medicine | Admitting: Family Medicine

## 2018-01-10 VITALS — BP 133/90 | HR 88 | Ht 64.5 in | Wt 204.0 lb

## 2018-01-10 DIAGNOSIS — B3731 Acute candidiasis of vulva and vagina: Secondary | ICD-10-CM

## 2018-01-10 DIAGNOSIS — Z01419 Encounter for gynecological examination (general) (routine) without abnormal findings: Secondary | ICD-10-CM | POA: Diagnosis not present

## 2018-01-10 DIAGNOSIS — Z0000 Encounter for general adult medical examination without abnormal findings: Secondary | ICD-10-CM | POA: Diagnosis not present

## 2018-01-10 DIAGNOSIS — B373 Candidiasis of vulva and vagina: Secondary | ICD-10-CM | POA: Diagnosis not present

## 2018-01-10 DIAGNOSIS — Z1239 Encounter for other screening for malignant neoplasm of breast: Secondary | ICD-10-CM

## 2018-01-10 MED ORDER — FLUCONAZOLE 150 MG PO TABS: 150.0000 mg | ORAL_TABLET | ORAL | 0 refills | Status: AC

## 2018-01-10 NOTE — Progress Notes (Signed)
GYNECOLOGY ANNUAL PREVENTATIVE CARE ENCOUNTER NOTE  Subjective:   Shirley [name] is a 42 y.o. [phone] female here for a routine annual gynecologic exam.  Current complaints: Perineal and periclitoral tears with intercourse, vaginal dryness.  This is been getting worse over the past several weeks, but she states that this is been going on for several years, particularly if they do not have sex every day.  The small tears occur in the dermis with penetration.  Patient is also complaining of night sweats and hot flashes occasionally.  Denies abnormal vaginal bleeding, discharge, pelvic pain, problems with intercourse or other gynecologic concerns.    The medical records show that the patient had an abdominal hysterectomy, but the patient reports having a vaginal hysterectomy due to fibroid uterus with menorrhagia  Gynecologic History No LMP recorded. Patient has had a hysterectomy. Patient is sexually active  Contraception: status post hysterectomy Last Pap: Several years ago. Results were: normal Last mammogram: n/a.   Obstetric History OB History  Gravida Para Term Preterm AB Living  '3 3 2 1   3  ' SAB TAB Ectopic Multiple Live Births          3    # Outcome Date GA Lbr Len/2nd Weight Sex Delivery Anes PTL Lv  3 Term     M Vag-Spont   LIV  2 Preterm     F CS-LTranv   LIV  1 Term      Vag-Spont   LIV    Past Medical History:  Diagnosis Date  . ADHD (attention deficit hyperactivity disorder)   . ALLERGIC RHINITIS 06/26/2008  . ANEMIA-IRON DEFICIENCY 06/26/2008  . 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  . ANKLE PAIN, LEFT 06/26/2008  . BACK PAIN, LUMBAR 10/15/2007  . BIPOLAR DISORDER UNSPECIFIED 09/13/2007  . CERVICAL RADICULOPATHY, LEFT 06/26/2008  . Cervicalgia 10/15/2007  . Chronic pain syndrome 03/21/2011  . COMMON MIGRAINE 06/26/2008  . DIABETES MELLITUS, TYPE II 06/05/2007  . DYSPNEA 12/05/2007  . EAR PAIN, RIGHT 12/05/2007  . ELEVATED BP READING WITHOUT DX HYPERTENSION 07/29/2009  . FATIGUE  12/05/2007  . FIBROMYALGIA 06/26/2008  . GASTROENTERITIS, ACUTE 12/15/2008  . GERD 06/26/2008  . Headache(784.0) 09/13/2007  . HYPERLIPIDEMIA 09/13/2007  . JOINT EFFUSION, RIGHT KNEE 07/29/2009  . KNEE PAIN, LEFT 11/21/2010  . LUMBAR RADICULOPATHY, LEFT 06/26/2008  . OTHER DISEASE OF PHARYNX OR NASOPHARYNX 07/03/2008  . PERIPHERAL EDEMA 01/06/2009  . POLYARTHRITIS 11/21/2010  . SHOULDER PAIN, BILATERAL 07/03/2008  . SINUSITIS- ACUTE-NOS 07/29/2009  . TACHYCARDIA 12/05/2007  . THYROID NODULE, RIGHT 11/20/2008  . TREMOR 01/02/2008  . Vertigo     Past Surgical History:  Procedure Laterality Date  . ABDOMINAL HYSTERECTOMY     fibroids  . back surgury     lumbar 2001  . CESAREAN SECTION     x 3  . thyroid fine needle aspiration  May 2008   showed non neoplastic goiter    Current Outpatient Medications on File Prior to Visit  Medication Sig Dispense Refill  . Multiple Vitamins-Minerals (MULTIVITAMIN WITH MINERALS) tablet Take 1 tablet by mouth daily.    . Probiotic Product (PROBIOTIC PO) Take 1 tablet by mouth as needed.     . calcium-vitamin D (OSCAL WITH D) 500-200 MG-UNIT tablet Take 1 tablet by mouth.    . cetirizine (ZYRTEC) 10 MG tablet Take 1 tablet (10 mg total) by mouth daily. (Patient not taking: Reported on 01/10/2018) 30 tablet 0  . diclofenac (VOLTAREN) 75 MG EC tablet Take 1 tablet (75  mg total) 2 (two) times daily by mouth. Take with food (Patient not taking: Reported on 01/10/2018) 30 tablet 0  . fluticasone (FLONASE) 50 MCG/ACT nasal spray Place 2 sprays into both nostrils daily. (Patient not taking: Reported on 01/10/2018) 1 g 0  . methocarbamol (ROBAXIN) 750 MG tablet Take 1 tablet (750 mg total) every 4 (four) hours by mouth. (Patient not taking: Reported on 01/10/2018) 40 tablet 0   No current facility-administered medications on file prior to visit.     Allergies  Allergen Reactions  . Sulfa Antibiotics Anaphylaxis  . Contrast Media [Iodinated Diagnostic Agents] Itching     Mri, severe heaving ~ can take benadryl without reaction  . Promethazine Hcl     Iv dose with benadryl causes severe aggrivation  . Tdap [Diphth-Acell Pertussis-Tetanus] Hives, Itching, Swelling, Anxiety and Rash    Social History   Socioeconomic History  . Marital status: Married    Spouse name: Not on file  . Number of children: 3  . Years of education: Not on file  . Highest education level: Not on file  Occupational History    Employer: UNEMPLOYED  Social Needs  . Financial resource strain: Not on file  . Food insecurity:    Worry: Not on file    Inability: Not on file  . Transportation needs:    Medical: Not on file    Non-medical: Not on file  Tobacco Use  . Smoking status: Never Smoker  . Smokeless tobacco: Never Used  Substance and Sexual Activity  . Alcohol use: No  . Drug use: No  . Sexual activity: Yes    Birth control/protection: Surgical  Lifestyle  . Physical activity:    Days per week: Not on file    Minutes per session: Not on file  . Stress: Not on file  Relationships  . Social connections:    Talks on phone: Not on file    Gets together: Not on file    Attends religious service: Not on file    Active member of club or organization: Not on file    Attends meetings of clubs or organizations: Not on file    Relationship status: Not on file  . Intimate partner violence:    Fear of current or ex partner: Not on file    Emotionally abused: Not on file    Physically abused: Not on file    Forced sexual activity: Not on file  Other Topics Concern  . Not on file  Social History Narrative  . Not on file    Family History  Problem Relation Age of Onset  . Thyroid disease Mother   . Diabetes Mother   . Asthma Mother   . Hypertension Father   . Hyperlipidemia Father   . Diabetes Father   . Emphysema Other   . Coronary artery disease Other   . Cancer Other        pancreatic and breast cancer  . Cancer Other        lung and prostate cancer     The following portions of the patient's history were reviewed and updated as appropriate: allergies, current medications, past family history, past medical history, past social history, past surgical history and problem list.  Review of Systems Pertinent items noted in HPI and remainder of comprehensive ROS otherwise negative.   Objective:  BP 133/90 (BP Location: Left Arm, Patient Position: Sitting)   Pulse 88   Ht 5' 4.5" (1.638 m)   Wt 204  lb (92.5 kg)   BMI 34.48 kg/m  CONSTITUTIONAL: Well-developed, well-nourished female in no acute distress.  HENT:  Normocephalic, atraumatic, External right and left ear normal. Oropharynx is clear and moist EYES: Conjunctivae and EOM are normal. Pupils are equal, round, and reactive to light. No scleral icterus.  NECK: Normal range of motion, supple, no masses.  Normal thyroid.   CARDIOVASCULAR: Normal heart rate noted, regular rhythm RESPIRATORY: Clear to auscultation bilaterally. Effort and breath sounds normal, no problems with respiration noted. BREASTS: Symmetric in size. No masses, skin changes, nipple drainage, or lymphadenopathy. ABDOMEN: Soft, normal bowel sounds, no distention noted.  No tenderness, rebound or guarding.  PELVIC: External genitalia show mild vulvovaginitis.  There is a small periclitoral fissure and tenderness to the perineum, which is where she has the previous fissures.  Mild atrophic vaginal mucosa.  Otherwise normal appearing external genitalia; normal appearing vaginal mucosa and cervix.  No abnormal discharge noted.  No other palpable masses, no uterine or adnexal tenderness. MUSCULOSKELETAL: Normal range of motion. No tenderness.  No cyanosis, clubbing, or edema.  2+ distal pulses. SKIN: Skin is warm and dry. No rash noted. Not diaphoretic. No erythema. No pallor. NEUROLOGIC: Alert and oriented to person, place, and time. Normal reflexes, muscle tone coordination. No cranial nerve deficit noted. PSYCHIATRIC:  Normal mood and affect. Normal behavior. Normal judgment and thought content.  Assessment:  Annual gynecologic examination with pap smear   Plan:  1. Well Woman Exam Will follow up results of pap smear and manage accordingly. Mammogram scheduled Discussed exercise and diet - Lipid panel - TSH - Hemoglobin A1c - CBC - Comp Met (CMET)  2. Vulvovaginitis due to yeast Diflucan x 2 weeks. Will reevaluate to see if she need estrogen therapy. - Cervicovaginal ancillary only   Routine preventative health maintenance measures emphasized. Please refer to After Visit Summary for other counseling recommendations.    Loma Boston, [place] for [employer]

## 2018-01-10 NOTE — Progress Notes (Signed)
Patient complaining of vaginal dryness. Painful intercourse. Kathrene Alu RNBSN

## 2018-01-11 ENCOUNTER — Encounter: Payer: Self-pay | Admitting: Family Medicine

## 2018-01-11 ENCOUNTER — Other Ambulatory Visit: Payer: Self-pay | Admitting: Family Medicine

## 2018-01-11 LAB — COMPREHENSIVE METABOLIC PANEL
ALT: 15 IU/L (ref 0–32)
AST: 18 IU/L (ref 0–40)
Albumin/Globulin Ratio: 1.4 (ref 1.2–2.2)
Albumin: 4.2 g/dL (ref 3.5–5.5)
Alkaline Phosphatase: 110 IU/L (ref 39–117)
BUN/Creatinine Ratio: 14 (ref 9–23)
BUN: 10 mg/dL (ref 6–24)
Bilirubin Total: 0.2 mg/dL (ref 0.0–1.2)
CO2: 23 mmol/L (ref 20–29)
Calcium: 9.7 mg/dL (ref 8.7–10.2)
Chloride: 100 mmol/L (ref 96–106)
Creatinine, Ser: 0.71 mg/dL (ref 0.57–1.00)
GFR calc Af Amer: 121 mL/min/{1.73_m2} (ref >59)
GFR calc non Af Amer: 105 mL/min/{1.73_m2} (ref >59)
Globulin, Total: 3.1 g/dL (ref 1.5–4.5)
Glucose: 276 mg/dL — ABNORMAL HIGH (ref 65–99)
Potassium: 5 mmol/L (ref 3.5–5.2)
Sodium: 137 mmol/L (ref 134–144)
Total Protein: 7.3 g/dL (ref 6.0–8.5)

## 2018-01-11 LAB — CBC
Hematocrit: 39.7 % (ref 34.0–46.6)
Hemoglobin: 13.1 g/dL (ref 11.1–15.9)
MCH: 27.9 pg (ref 26.6–33.0)
MCHC: 33 g/dL (ref 31.5–35.7)
MCV: 85 fL (ref 79–97)
Platelets: 398 10*3/uL — ABNORMAL HIGH (ref 150–379)
RBC: 4.7 x10E6/uL (ref 3.77–5.28)
RDW: 14.3 % (ref 12.3–15.4)
WBC: 6.2 10*3/uL (ref 3.4–10.8)

## 2018-01-11 LAB — CERVICOVAGINAL ANCILLARY ONLY
Bacterial vaginitis: NEGATIVE
Candida vaginitis: NEGATIVE

## 2018-01-11 LAB — LIPID PANEL
Chol/HDL Ratio: 4.9 ratio — ABNORMAL HIGH (ref 0.0–4.4)
Cholesterol, Total: 251 mg/dL — ABNORMAL HIGH (ref 100–199)
HDL: 51 mg/dL (ref >39)
LDL Calculated: 174 mg/dL — ABNORMAL HIGH (ref 0–99)
Triglycerides: 130 mg/dL (ref 0–149)
VLDL Cholesterol Cal: 26 mg/dL (ref 5–40)

## 2018-01-11 LAB — HEMOGLOBIN A1C
Est. average glucose Bld gHb Est-mCnc: 338 mg/dL
Hgb A1c MFr Bld: 13.4 % — ABNORMAL HIGH (ref 4.8–5.6)

## 2018-01-11 LAB — TSH: TSH: 0.97 u[IU]/mL (ref 0.450–4.500)

## 2018-01-11 MED ORDER — METFORMIN HCL 500 MG PO TABS: 500.0000 mg | ORAL_TABLET | Freq: Two times a day (BID) | ORAL | 5 refills | Status: DC

## 2018-01-14 ENCOUNTER — Encounter: Payer: Self-pay | Admitting: Family Medicine

## 2018-01-14 ENCOUNTER — Ambulatory Visit: Payer: Medicaid Other | Admitting: Family Medicine

## 2018-01-14 DIAGNOSIS — G8929 Other chronic pain: Secondary | ICD-10-CM

## 2018-01-14 DIAGNOSIS — M25561 Pain in right knee: Secondary | ICD-10-CM | POA: Diagnosis not present

## 2018-01-14 DIAGNOSIS — M25562 Pain in left knee: Secondary | ICD-10-CM

## 2018-01-14 DIAGNOSIS — M546 Pain in thoracic spine: Secondary | ICD-10-CM | POA: Diagnosis present

## 2018-01-14 NOTE — Assessment & Plan Note (Signed)
primarily due to patellar tendinitis, less pes bursitis.  Reviewed home exercise program to do daily.  Icing, aleve or ibuprofen.  Patellar tendon strap.  Consider physical therapy, nitro patches if not improving.  F/u in 6 weeks.

## 2018-01-14 NOTE — Assessment & Plan Note (Signed)
2/2 thoracic/rhomboid strains.  She declined physical therapy for now - shown home exercises and stretches to do daily.  F/u in 6 weeks.

## 2018-01-14 NOTE — Patient Instructions (Signed)
You have patellar tendinitis. Ice area 15 minutes at a time 3-4 times a day as needed. Aleve 2 tabs twice a day with food OR ibuprofen 600mg  three times a day with food for pain and inflammation. Straight leg raises, knee extensions, decline squats 3 sets of 10 once a day. Patellar tendon strap when up and walking around. Consider physical therapy. Consider nitro patches if not improving.   Follow up with me in 6 weeks.  You're describing spinal stenosis of your low back. Do basic strengthening exercises in the handout and at the gym but avoid extension as we discussed.  You have thoracic/rhomboid strains of your mid-back. See handout for strengthening exercises for these and do them daily, 3 sets of 10 once a day. Follow up with me in 6 weeks but you can call me sooner if you want to do physical therapy.

## 2018-01-14 NOTE — Progress Notes (Signed)
PCP: Biagio Borg, MD  Subjective:   HPI: Patient is a 42 y.o. female here for knee, back pain.  Patient reports for a few months she's had anterior bilateral knee discomfort/weakness. Would not describe this as a pain but if she's doing squats she can feel some discomfort around both knees anteriorly and sensation that she has to pop her knee. Pain currently 0/10. Better with sitting. Also with mid-back pain that does not radiate, stays mainly in between her scapulae. Not currently taking anything for this. No bowel/bladder dysfunction. No skin changes. At end of visit she also asked about pain of low back radiating into both legs at times that can be severe.  Past Medical History:  Diagnosis Date  . ADHD (attention deficit hyperactivity disorder)   . ALLERGIC RHINITIS 06/26/2008  . ANEMIA-IRON DEFICIENCY 06/26/2008  . 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  . ANKLE PAIN, LEFT 06/26/2008  . BACK PAIN, LUMBAR 10/15/2007  . BIPOLAR DISORDER UNSPECIFIED 09/13/2007  . CERVICAL RADICULOPATHY, LEFT 06/26/2008  . Cervicalgia 10/15/2007  . Chronic pain syndrome 03/21/2011  . COMMON MIGRAINE 06/26/2008  . DIABETES MELLITUS, TYPE II 06/05/2007  . DYSPNEA 12/05/2007  . EAR PAIN, RIGHT 12/05/2007  . ELEVATED BP READING WITHOUT DX HYPERTENSION 07/29/2009  . FATIGUE 12/05/2007  . FIBROMYALGIA 06/26/2008  . GASTROENTERITIS, ACUTE 12/15/2008  . GERD 06/26/2008  . Headache(784.0) 09/13/2007  . HYPERLIPIDEMIA 09/13/2007  . JOINT EFFUSION, RIGHT KNEE 07/29/2009  . KNEE PAIN, LEFT 11/21/2010  . LUMBAR RADICULOPATHY, LEFT 06/26/2008  . OTHER DISEASE OF PHARYNX OR NASOPHARYNX 07/03/2008  . PERIPHERAL EDEMA 01/06/2009  . POLYARTHRITIS 11/21/2010  . SHOULDER PAIN, BILATERAL 07/03/2008  . SINUSITIS- ACUTE-NOS 07/29/2009  . TACHYCARDIA 12/05/2007  . THYROID NODULE, RIGHT 11/20/2008  . TREMOR 01/02/2008  . Vertigo     Current Outpatient Medications on File Prior to Visit  Medication Sig Dispense Refill  . calcium-vitamin D (OSCAL WITH  D) 500-200 MG-UNIT tablet Take 1 tablet by mouth.    . fluconazole (DIFLUCAN) 150 MG tablet Take 1 tablet (150 mg total) by mouth every 3 (three) days for 14 days. For three doses 5 tablet 0  . metFORMIN (GLUCOPHAGE) 500 MG tablet Take 1 tablet (500 mg total) by mouth 2 (two) times daily with a meal. 60 tablet 5  . Multiple Vitamins-Minerals (MULTIVITAMIN WITH MINERALS) tablet Take 1 tablet by mouth daily.    . Probiotic Product (PROBIOTIC PO) Take 1 tablet by mouth as needed.      No current facility-administered medications on file prior to visit.     Past Surgical History:  Procedure Laterality Date  . back surgury     lumbar 2001  . CESAREAN SECTION     x 3  . thyroid fine needle aspiration  May 2008   showed non neoplastic goiter  . VAGINAL HYSTERECTOMY     fibroids    Allergies  Allergen Reactions  . Sulfa Antibiotics Anaphylaxis  . Contrast Media [Iodinated Diagnostic Agents] Itching    Mri, severe heaving ~ can take benadryl without reaction  . Promethazine Hcl     Iv dose with benadryl causes severe aggrivation  . Tdap [Diphth-Acell Pertussis-Tetanus] Hives, Itching, Swelling, Anxiety and Rash    Social History   Socioeconomic History  . Marital status: Married    Spouse name: Not on file  . Number of children: 3  . Years of education: Not on file  . Highest education level: Not on file  Occupational History    Employer: UNEMPLOYED  Social  Needs  . Financial resource strain: Not on file  . Food insecurity:    Worry: Not on file    Inability: Not on file  . Transportation needs:    Medical: Not on file    Non-medical: Not on file  Tobacco Use  . Smoking status: Never Smoker  . Smokeless tobacco: Never Used  Substance and Sexual Activity  . Alcohol use: No  . Drug use: No  . Sexual activity: Yes    Birth control/protection: Surgical  Lifestyle  . Physical activity:    Days per week: Not on file    Minutes per session: Not on file  . Stress: Not on  file  Relationships  . Social connections:    Talks on phone: Not on file    Gets together: Not on file    Attends religious service: Not on file    Active member of club or organization: Not on file    Attends meetings of clubs or organizations: Not on file    Relationship status: Not on file  . Intimate partner violence:    Fear of current or ex partner: Not on file    Emotionally abused: Not on file    Physically abused: Not on file    Forced sexual activity: Not on file  Other Topics Concern  . Not on file  Social History Narrative  . Not on file    Family History  Problem Relation Age of Onset  . Thyroid disease Mother   . Diabetes Mother   . Asthma Mother   . Hypertension Father   . Hyperlipidemia Father   . Diabetes Father   . Emphysema Other   . Coronary artery disease Other   . Cancer Other        pancreatic and breast cancer  . Cancer Other        lung and prostate cancer    BP (!) 136/94   Pulse (!) 104   Ht 5\' 4"  (1.626 m)   Wt 206 lb (93.4 kg)   BMI 35.36 kg/m   Review of Systems: See HPI above.     Objective:  Physical Exam:  Gen: NAD, comfortable in exam room  Back: No gross deformity, scoliosis. TTP thoracic paraspinal regions.  No bony tenderness. FROM with pain on rotation. Strength LEs 5/5 all muscle groups.   Negative SLRs. Negative logroll bilateral hips  Right knee: No gross deformity, ecchymoses, swelling. Mild TTP pes bursa but points to inferior patellar pole as area of most pain with squatting. FROM with 5/5 strength. Negative ant/post drawers. Negative valgus/varus testing. Negative lachmanns. Negative mcmurrays, apleys, patellar apprehension. NV intact distally.  Left knee: No gross deformity, ecchymoses, swelling. Mild TTP pes bursa but points to inferior patellar pole as area of most pain with squatting. FROM with 5/5 strength. Negative ant/post drawers. Negative valgus/varus testing. Negative lachmanns. Negative  mcmurrays, apleys, patellar apprehension. NV intact distally.   Assessment & Plan:  1. Bilateral knee pain - primarily due to patellar tendinitis, less pes bursitis.  Reviewed home exercise program to do daily.  Icing, aleve or ibuprofen.  Patellar tendon strap.  Consider physical therapy, nitro patches if not improving.  F/u in 6 weeks.  2. Thoracic pain - 2/2 thoracic/rhomboid strains.  She declined physical therapy for now - shown home exercises and stretches to do daily.  F/u in 6 weeks.

## 2018-01-22 ENCOUNTER — Encounter: Payer: Self-pay | Admitting: Family Medicine

## 2018-01-22 ENCOUNTER — Ambulatory Visit (HOSPITAL_BASED_OUTPATIENT_CLINIC_OR_DEPARTMENT_OTHER)
Admission: RE | Admit: 2018-01-22 | Discharge: 2018-01-22 | Disposition: A | Discharge status: Home / Self Care | Payer: Medicaid Other | Source: Ambulatory Visit | Attending: Family Medicine | Admitting: Family Medicine

## 2018-01-22 ENCOUNTER — Ambulatory Visit: Payer: Medicaid Other | Admitting: Family Medicine

## 2018-01-22 ENCOUNTER — Encounter (HOSPITAL_BASED_OUTPATIENT_CLINIC_OR_DEPARTMENT_OTHER): Payer: Self-pay

## 2018-01-22 VITALS — BP 144/81 | HR 97 | Ht 64.0 in | Wt 206.0 lb

## 2018-01-22 DIAGNOSIS — M545 Low back pain, unspecified: Secondary | ICD-10-CM

## 2018-01-22 DIAGNOSIS — Z1231 Encounter for screening mammogram for malignant neoplasm of breast: Secondary | ICD-10-CM | POA: Diagnosis not present

## 2018-01-22 DIAGNOSIS — M5441 Lumbago with sciatica, right side: Secondary | ICD-10-CM

## 2018-01-22 DIAGNOSIS — M79604 Pain in right leg: Secondary | ICD-10-CM

## 2018-01-22 DIAGNOSIS — Z1239 Encounter for other screening for malignant neoplasm of breast: Secondary | ICD-10-CM

## 2018-01-22 MED ORDER — PREDNISONE 10 MG PO TABS: ORAL_TABLET | ORAL | 0 refills | Status: DC

## 2018-01-22 MED ORDER — TIZANIDINE HCL 4 MG PO TABS: 4.0000 mg | ORAL_TABLET | Freq: Four times a day (QID) | ORAL | 1 refills | Status: DC | PRN

## 2018-01-22 NOTE — Patient Instructions (Signed)
You have lumbar radiculopathy (a pinched nerve in your low back). Take tylenol for baseline pain relief (1-2 extra strength tabs 3x/day) A prednisone dose pack is the best option for immediate relief and may be prescribed. Day after finishing prednisone ok to start aleve or ibuprofen with food for pain and inflammation. Tizanidine as needed for muscle spasms (no driving on this medicine if it makes you sleepy). Stay as active as possible. Physical therapy has been shown to be helpful as well once you get out of this acute phase. Strengthening of low back muscles, abdominal musculature are key for long term pain relief. If not improving, will consider further imaging (MRI). Follow up with me in 1 month but call me in a week to let me know how you're doing - hopefully we can start physical therapy then.

## 2018-01-23 ENCOUNTER — Encounter: Payer: Self-pay | Admitting: Family Medicine

## 2018-01-23 NOTE — Assessment & Plan Note (Signed)
exam consistent with lumbar radiculopathy given presentation, severity of her pain.  Discussed lumbar strain, gluteal strain, and IT band syndrome typically do not all occur acutely at same time and are not this severe.  Start with prednisone dose pack with tizanidine as needed.  Call us in a week for an update on her status - hopefully can start physical therapy at that point.

## 2018-01-23 NOTE — Progress Notes (Signed)
PCP: Biagio Borg, MD  Subjective:   HPI: Patient is a 42 y.o. female here for right hip/back pain.  Patient reports on 4/1 she started to get pain posterolateral right hip. Worse with lying on her right side. Pain level 6/10 and sharp, up to 8/10 and worse with walking. Feels like she's 'sitting on a nerve' Tried massage, heat, tylenol. No numbness or tingling. No bowel/bladder dysfunction.  Past Medical History:  Diagnosis Date  . ADHD (attention deficit hyperactivity disorder)   . ALLERGIC RHINITIS 06/26/2008  . ANEMIA-IRON DEFICIENCY 06/26/2008  . 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  . ANKLE PAIN, LEFT 06/26/2008  . BACK PAIN, LUMBAR 10/15/2007  . BIPOLAR DISORDER UNSPECIFIED 09/13/2007  . CERVICAL RADICULOPATHY, LEFT 06/26/2008  . Cervicalgia 10/15/2007  . Chronic pain syndrome 03/21/2011  . COMMON MIGRAINE 06/26/2008  . DIABETES MELLITUS, TYPE II 06/05/2007  . DYSPNEA 12/05/2007  . EAR PAIN, RIGHT 12/05/2007  . ELEVATED BP READING WITHOUT DX HYPERTENSION 07/29/2009  . FATIGUE 12/05/2007  . FIBROMYALGIA 06/26/2008  . GASTROENTERITIS, ACUTE 12/15/2008  . GERD 06/26/2008  . Headache(784.0) 09/13/2007  . HYPERLIPIDEMIA 09/13/2007  . JOINT EFFUSION, RIGHT KNEE 07/29/2009  . KNEE PAIN, LEFT 11/21/2010  . LUMBAR RADICULOPATHY, LEFT 06/26/2008  . OTHER DISEASE OF PHARYNX OR NASOPHARYNX 07/03/2008  . PERIPHERAL EDEMA 01/06/2009  . POLYARTHRITIS 11/21/2010  . SHOULDER PAIN, BILATERAL 07/03/2008  . SINUSITIS- ACUTE-NOS 07/29/2009  . TACHYCARDIA 12/05/2007  . THYROID NODULE, RIGHT 11/20/2008  . TREMOR 01/02/2008  . Vertigo     Current Outpatient Medications on File Prior to Visit  Medication Sig Dispense Refill  . calcium-vitamin D (OSCAL WITH D) 500-200 MG-UNIT tablet Take 1 tablet by mouth.    . fluconazole (DIFLUCAN) 150 MG tablet Take 1 tablet (150 mg total) by mouth every 3 (three) days for 14 days. For three doses 5 tablet 0  . metFORMIN (GLUCOPHAGE) 500 MG tablet Take 1 tablet (500 mg total) by mouth 2  (two) times daily with a meal. 60 tablet 5  . Multiple Vitamins-Minerals (MULTIVITAMIN WITH MINERALS) tablet Take 1 tablet by mouth daily.    . Probiotic Product (PROBIOTIC PO) Take 1 tablet by mouth as needed.      No current facility-administered medications on file prior to visit.     Past Surgical History:  Procedure Laterality Date  . back surgury     lumbar 2001  . CESAREAN SECTION     x 3  . thyroid fine needle aspiration  May 2008   showed non neoplastic goiter  . VAGINAL HYSTERECTOMY     fibroids    Allergies  Allergen Reactions  . Sulfa Antibiotics Anaphylaxis  . Contrast Media [Iodinated Diagnostic Agents] Itching    Mri, severe heaving ~ can take benadryl without reaction  . Promethazine Hcl     Iv dose with benadryl causes severe aggrivation  . Tdap [Tetanus-Diphth-Acell Pertussis] Hives, Itching, Swelling, Anxiety and Rash    Social History   Socioeconomic History  . Marital status: Married    Spouse name: Not on file  . Number of children: 3  . Years of education: Not on file  . Highest education level: Not on file  Occupational History    Employer: UNEMPLOYED  Social Needs  . Financial resource strain: Not on file  . Food insecurity:    Worry: Not on file    Inability: Not on file  . Transportation needs:    Medical: Not on file    Non-medical: Not on file  Tobacco  Use  . Smoking status: Never Smoker  . Smokeless tobacco: Never Used  Substance and Sexual Activity  . Alcohol use: No  . Drug use: No  . Sexual activity: Yes    Birth control/protection: Surgical  Lifestyle  . Physical activity:    Days per week: Not on file    Minutes per session: Not on file  . Stress: Not on file  Relationships  . Social connections:    Talks on phone: Not on file    Gets together: Not on file    Attends religious service: Not on file    Active member of club or organization: Not on file    Attends meetings of clubs or organizations: Not on file     Relationship status: Not on file  . Intimate partner violence:    Fear of current or ex partner: Not on file    Emotionally abused: Not on file    Physically abused: Not on file    Forced sexual activity: Not on file  Other Topics Concern  . Not on file  Social History Narrative  . Not on file    Family History  Problem Relation Age of Onset  . Thyroid disease Mother   . Diabetes Mother   . Asthma Mother   . Hypertension Father   . Hyperlipidemia Father   . Diabetes Father   . Emphysema Other   . Coronary artery disease Other   . Cancer Other        pancreatic and breast cancer  . Cancer Other        lung and prostate cancer    BP (!) 144/81   Pulse 97   Ht 5\' 4"  (1.626 m)   Wt 206 lb (93.4 kg)   BMI 35.36 kg/m   Review of Systems: See HPI above.     Objective:  Physical Exam:  Gen: NAD, comfortable in exam room  Back: No gross deformity, scoliosis. TTP right lumbar paraspinal region and buttock, proximal IT band.  No midline or bony TTP. Full extension, lateral rotations.  Flexion to 50 degrees, painful. Strength LEs 5/5 all muscle groups.   2+ MSRs in patellar and achilles tendons, equal bilaterally. Negative SLRs. Sensation intact to light touch bilaterally.  Right hip: No deformity. FROM with 5/5 strength. No tenderness to palpation other than that noted above. NVI distally. Negative logroll Negative fabers and piriformis stretches.  Assessment & Plan:  1. Low back pain radiating to right hip - exam consistent with lumbar radiculopathy given presentation, severity of her pain.  Discussed lumbar strain, gluteal strain, and IT band syndrome typically do not all occur acutely at same time and are not this severe.  Start with prednisone dose pack with tizanidine as needed.  Call us in a week for an update on her status - hopefully can start physical therapy at that point.

## 2018-01-25 ENCOUNTER — Other Ambulatory Visit: Payer: Self-pay | Admitting: Family Medicine

## 2018-01-25 DIAGNOSIS — R928 Other abnormal and inconclusive findings on diagnostic imaging of breast: Secondary | ICD-10-CM

## 2018-01-29 ENCOUNTER — Telehealth: Payer: Self-pay | Admitting: Family Medicine

## 2018-01-29 NOTE — Telephone Encounter (Signed)
Typically what we would do next is couple physical therapy with an extended course of prednisone (12 days) if she got relief but pain recurred.  Other option would be to do MRI of lumbar spine but typically this is if we're thinking about injections in the back.  Let me know how she would like to proceed.

## 2018-01-29 NOTE — Telephone Encounter (Signed)
Patient following up after completion of prednisone Rx. She states the medication helped with her inflammation and pain while she was taking it but after she finished the medication her pain came back and is radiating down her leg

## 2018-01-30 MED ORDER — PREDNISONE 10 MG PO TABS: ORAL_TABLET | ORAL | 0 refills | Status: DC

## 2018-01-30 NOTE — Telephone Encounter (Signed)
Spoke to patient and she would like to try physical therapy with the extended course of prednisone first. Patient would like to go to physical therapy here at [facility] as long as they accept Medicaid.

## 2018-01-30 NOTE — Addendum Note (Signed)
Addended by: Sherrie George F on: 01/30/2018 04:14 PM   Modules accepted: Orders

## 2018-01-30 NOTE — Telephone Encounter (Signed)
Extended course sent in.  Thanks!

## 2018-01-30 NOTE — Telephone Encounter (Signed)
Order placed for PT downstairs.

## 2018-01-31 ENCOUNTER — Other Ambulatory Visit: Payer: Self-pay | Admitting: Family Medicine

## 2018-01-31 ENCOUNTER — Ambulatory Visit
Admission: RE | Admit: 2018-01-31 | Discharge: 2018-01-31 | Disposition: A | Discharge status: Home / Self Care | Payer: Medicaid Other | Source: Ambulatory Visit | Attending: Family Medicine | Admitting: Family Medicine

## 2018-01-31 DIAGNOSIS — R928 Other abnormal and inconclusive findings on diagnostic imaging of breast: Secondary | ICD-10-CM

## 2018-01-31 DIAGNOSIS — N63 Unspecified lump in unspecified breast: Secondary | ICD-10-CM

## 2018-02-01 ENCOUNTER — Encounter (HOSPITAL_BASED_OUTPATIENT_CLINIC_OR_DEPARTMENT_OTHER): Payer: Self-pay | Admitting: Emergency Medicine

## 2018-02-01 ENCOUNTER — Other Ambulatory Visit: Payer: Self-pay

## 2018-02-01 ENCOUNTER — Emergency Department (HOSPITAL_BASED_OUTPATIENT_CLINIC_OR_DEPARTMENT_OTHER)
Admission: EM | Admit: 2018-02-01 | Discharge: 2018-02-01 | Disposition: A | Discharge status: Home / Self Care | Payer: Medicaid Other | Attending: Emergency Medicine | Admitting: Emergency Medicine

## 2018-02-01 ENCOUNTER — Emergency Department (HOSPITAL_BASED_OUTPATIENT_CLINIC_OR_DEPARTMENT_OTHER): Payer: Medicaid Other

## 2018-02-01 DIAGNOSIS — S99921A Unspecified injury of right foot, initial encounter: Secondary | ICD-10-CM | POA: Diagnosis present

## 2018-02-01 DIAGNOSIS — S92514A Nondisplaced fracture of proximal phalanx of right lesser toe(s), initial encounter for closed fracture: Secondary | ICD-10-CM | POA: Diagnosis not present

## 2018-02-01 DIAGNOSIS — F319 Bipolar disorder, unspecified: Secondary | ICD-10-CM | POA: Insufficient documentation

## 2018-02-01 DIAGNOSIS — W2203XA Walked into furniture, initial encounter: Secondary | ICD-10-CM | POA: Insufficient documentation

## 2018-02-01 DIAGNOSIS — F909 Attention-deficit hyperactivity disorder, unspecified type: Secondary | ICD-10-CM | POA: Diagnosis not present

## 2018-02-01 DIAGNOSIS — E119 Type 2 diabetes mellitus without complications: Secondary | ICD-10-CM | POA: Insufficient documentation

## 2018-02-01 DIAGNOSIS — Z79899 Other long term (current) drug therapy: Secondary | ICD-10-CM | POA: Diagnosis not present

## 2018-02-01 DIAGNOSIS — Y92009 Unspecified place in unspecified non-institutional (private) residence as the place of occurrence of the external cause: Secondary | ICD-10-CM | POA: Insufficient documentation

## 2018-02-01 DIAGNOSIS — Z7984 Long term (current) use of oral hypoglycemic drugs: Secondary | ICD-10-CM | POA: Diagnosis not present

## 2018-02-01 DIAGNOSIS — Y9389 Activity, other specified: Secondary | ICD-10-CM | POA: Diagnosis not present

## 2018-02-01 DIAGNOSIS — Y998 Other external cause status: Secondary | ICD-10-CM | POA: Insufficient documentation

## 2018-02-01 MED ORDER — NAPROXEN 250 MG PO TABS
500.0000 mg | ORAL_TABLET | Freq: Once | ORAL | Status: AC
  Administered 2018-02-01: 500 mg via ORAL
  Filled 2018-02-01: qty 2

## 2018-02-01 NOTE — ED Provider Notes (Signed)
[facility] Provider Note: Georgena Spurling, MD, FACEP  CSN: [id number] MRN: [id number] ARRIVAL: 02/01/18 at [place]: [place]  Toe Injury   HISTORY OF PRESENT ILLNESS  02/01/18 6:27 AM Shirley [name] is a 42 y.o. female who stubbed her right fifth toe on a piece of furniture about 105 this morning.  She is having 7 out of 10 pain in that toe which is sharp in nature.  Pain is worse with movement or palpation.  There is swelling of that toe as well.  She denies other injury.  She attempted to buddy tape her toe but this increased her pain.  Consultation with the [facility] state controlled substances database reveals the patient has received 1 prescription for tramadol in the past 2 years.   Past Medical History:  Diagnosis Date  . ADHD (attention deficit hyperactivity disorder)   . ALLERGIC RHINITIS 06/26/2008  . ANEMIA-IRON DEFICIENCY 06/26/2008  . 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  . ANKLE PAIN, LEFT 06/26/2008  . BACK PAIN, LUMBAR 10/15/2007  . BIPOLAR DISORDER UNSPECIFIED 09/13/2007  . CERVICAL RADICULOPATHY, LEFT 06/26/2008  . Cervicalgia 10/15/2007  . Chronic pain syndrome 03/21/2011  . COMMON MIGRAINE 06/26/2008  . DIABETES MELLITUS, TYPE II 06/05/2007  . DYSPNEA 12/05/2007  . EAR PAIN, RIGHT 12/05/2007  . ELEVATED BP READING WITHOUT DX HYPERTENSION 07/29/2009  . FATIGUE 12/05/2007  . FIBROMYALGIA 06/26/2008  . GASTROENTERITIS, ACUTE 12/15/2008  . GERD 06/26/2008  . Headache(784.0) 09/13/2007  . HYPERLIPIDEMIA 09/13/2007  . JOINT EFFUSION, RIGHT KNEE 07/29/2009  . KNEE PAIN, LEFT 11/21/2010  . LUMBAR RADICULOPATHY, LEFT 06/26/2008  . OTHER DISEASE OF PHARYNX OR NASOPHARYNX 07/03/2008  . PERIPHERAL EDEMA 01/06/2009  . POLYARTHRITIS 11/21/2010  . SHOULDER PAIN, BILATERAL 07/03/2008  . SINUSITIS- ACUTE-NOS 07/29/2009  . TACHYCARDIA 12/05/2007  . THYROID NODULE, RIGHT 11/20/2008  . TREMOR 01/02/2008  . Vertigo     Past Surgical History:  Procedure Laterality Date   . back surgury     lumbar 2001  . CESAREAN SECTION     x 3  . thyroid fine needle aspiration  May 2008   showed non neoplastic goiter  . VAGINAL HYSTERECTOMY     fibroids    Family History  Problem Relation Age of Onset  . Thyroid disease Mother   . Diabetes Mother   . Asthma Mother   . Hypertension Father   . Hyperlipidemia Father   . Diabetes Father   . Emphysema Other   . Coronary artery disease Other   . Cancer Other        pancreatic and breast cancer  . Cancer Other        lung and prostate cancer  . Breast cancer Neg Hx     Social History   Tobacco Use  . Smoking status: Never Smoker  . Smokeless tobacco: Never Used  Substance Use Topics  . Alcohol use: No  . Drug use: No    Prior to Admission medications   Medication Sig Start Date End Date Taking? Authorizing Provider  calcium-vitamin D (OSCAL WITH D) 500-200 MG-UNIT tablet Take 1 tablet by mouth.    [provider]  metFORMIN (GLUCOPHAGE) 500 MG tablet Take 1 tablet (500 mg total) by mouth 2 (two) times daily with a meal. 01/11/18   Truett Mainland, DO  Multiple Vitamins-Minerals (MULTIVITAMIN WITH MINERALS) tablet Take 1 tablet by mouth daily.    [provider]  predniSONE (DELTASONE) 10 MG tablet 6 tabs po days  1-2, 5 tabs po days 3-4, 4 tabs po days 5-6, 3 tabs po days 7-8, 2 tabs po days 9-10, 1 tab po days 11-12 01/30/18   Hudnall, Sharyn Lull, MD  Probiotic Product (PROBIOTIC PO) Take 1 tablet by mouth as needed.     [provider]  tiZANidine (ZANAFLEX) 4 MG tablet Take 1 tablet (4 mg total) by mouth every 6 (six) hours as needed for muscle spasms. 01/22/18   Dene Gentry, MD    Allergies Sulfa antibiotics; Contrast media [iodinated diagnostic agents]; Promethazine hcl; and Tdap [tetanus-diphth-acell pertussis]   REVIEW OF SYSTEMS  Negative except as noted here or in the History of Present Illness.   PHYSICAL EXAMINATION  Initial Vital Signs Blood pressure 114/87,  pulse (!) 108, temperature 97.9 F (36.6 C), temperature source Oral, resp. rate 18, height 5\' 4"  (1.626 m), weight 93.4 kg (206 lb), SpO2 98 %.  Examination General: Well-developed, well-nourished female in no acute distress; appearance consistent with age of record HENT: normocephalic; atraumatic Eyes: Normal appearance Neck: supple Heart: regular rate and rhythm Lungs: clear to auscultation bilaterally Abdomen: soft; nondistended; nontender; bowel sounds present Extremities: Tenderness, and ecchymosis of proximal phalanx of right fifth toe with decreased range of motion, sensation intact distally with brisk capillary refill Neurologic: Awake, alert and oriented; motor function intact in all extremities and symmetric; no facial droop Skin: Warm and dry Psychiatric: Normal mood and affect   RESULTS  Summary of this visit's results, reviewed by myself:   EKG Interpretation  Date/Time:    Ventricular Rate:    PR Interval:    QRS Duration:   QT Interval:    QTC Calculation:   R Axis:     Text Interpretation:        Laboratory Studies: No results found for this or any previous visit (from the past 24 hour(s)). Imaging Studies: Dg Toe 5th Right  Result Date: 02/01/2018 CLINICAL DATA:  Struck pinky toe on furniture this morning, pain proximally. EXAM: RIGHT FIFTH TOE COMPARISON:  Foot radiograph 12/20/2015 FINDINGS: Fifth toe proximal phalanx fracture slightly more proximal than site of prior fracture is likely acute with mild displacement and angulation. No additional acute fracture of the digit. IMPRESSION: Acute fifth toe proximal phalanx fracture. Electronically Signed   By: Jeb Levering M.D.   On: 02/01/2018 07:00   US Breast Ltd Uni Left Inc Axilla  Result Date: 01/31/2018 CLINICAL DATA:  42 year old patient recently recalled from baseline screening mammogram for evaluation of a mass in each breast. EXAM: DIGITAL DIAGNOSTIC BILATERAL MAMMOGRAM WITH CAD AND TOMO  ULTRASOUND BILATERAL BREAST COMPARISON:  Screening mammogram January 22, 2018 ACR Breast Density Category b: There are scattered areas of fibroglandular density. FINDINGS: Focal spot compression views of the central right breast confirm a circumscribed approximately 0.7 cm oval mass. In the posterior third of the upper outer left breast spot compression views confirm a somewhat triangular-shaped low-density circumscribed nodule measuring approximately 0.5 cm. Mammographic images were processed with CAD. Targeted ultrasound is performed, showing a circumscribed oval hypoechoic mass with no associated vascular flow at 12 o'clock position 2 cm from the nipple in the right breast. This mass measures 0.8 x 0.7 x 0.5 cm and has features suggestive of a benign fibroadenoma. Ultrasound of the upper-outer quadrant of the left breast shows a 0.6 x 0.4 x 0.3 cm mixed echogenicity but predominantly cystic appearing nodule at 2 o'clock position 10 cm from the nipple. There is no associated vascular flow. IMPRESSION: 1. 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 2. Probably benign nodule in the 2 o'clock position left breast 10 cm from the nipple. Considerations include focal fibrocystic changes or benign fat necrosis. RECOMMENDATION: Bilateral breast ultrasound is recommended in 6 months. The patient and I discussed that we follow probable fibroadenomas for 2 years. I have discussed the findings and recommendations with the patient. Results were also provided in writing at the conclusion of the visit. If applicable, a reminder letter will be sent to the patient regarding the next appointment. BI-RADS CATEGORY  3: Probably benign. Electronically Signed   By: Curlene Dolphin M.D.   On: 01/31/2018 17:08   US Breast Ltd Uni Right Inc Axilla  Result Date: 01/31/2018 CLINICAL DATA:  42 year old patient recently recalled from baseline screening mammogram for evaluation of a mass in each breast. EXAM: DIGITAL  DIAGNOSTIC BILATERAL MAMMOGRAM WITH CAD AND TOMO ULTRASOUND BILATERAL BREAST COMPARISON:  Screening mammogram January 22, 2018 ACR Breast Density Category b: There are scattered areas of fibroglandular density. FINDINGS: Focal spot compression views of the central right breast confirm a circumscribed approximately 0.7 cm oval mass. In the posterior third of the upper outer left breast spot compression views confirm a somewhat triangular-shaped low-density circumscribed nodule measuring approximately 0.5 cm. Mammographic images were processed with CAD. Targeted ultrasound is performed, showing a circumscribed oval hypoechoic mass with no associated vascular flow at 12 o'clock position 2 cm from the nipple in the right breast. This mass measures 0.8 x 0.7 x 0.5 cm and has features suggestive of a benign fibroadenoma. Ultrasound of the upper-outer quadrant of the left breast shows a 0.6 x 0.4 x 0.3 cm mixed echogenicity but predominantly cystic appearing nodule at 2 o'clock position 10 cm from the nipple. There is no associated vascular flow. IMPRESSION: 1. Probable fibroadenoma 12 o'clock position of the right breast. 2. Probably benign nodule in the 2 o'clock position left breast 10 cm from the nipple. Considerations include focal fibrocystic changes or benign fat necrosis. RECOMMENDATION: Bilateral breast ultrasound is recommended in 6 months. The patient and I discussed that we follow probable fibroadenomas for 2 years. I have discussed the findings and recommendations with the patient. Results were also provided in writing at the conclusion of the visit. If applicable, a reminder letter will be sent to the patient regarding the next appointment. BI-RADS CATEGORY  3: Probably benign. Electronically Signed   By: Curlene Dolphin M.D.   On: 01/31/2018 17:08   Mm Diag Breast Tomo Bilateral  Result Date: 01/31/2018 CLINICAL DATA:  42 year old patient recently recalled from baseline screening mammogram for evaluation of a  mass in each breast. EXAM: DIGITAL DIAGNOSTIC BILATERAL MAMMOGRAM WITH CAD AND TOMO ULTRASOUND BILATERAL BREAST COMPARISON:  Screening mammogram January 22, 2018 ACR Breast Density Category b: There are scattered areas of fibroglandular density. FINDINGS: Focal spot compression views of the central right breast confirm a circumscribed approximately 0.7 cm oval mass. In the posterior third of the upper outer left breast spot compression views confirm a somewhat triangular-shaped low-density circumscribed nodule measuring approximately 0.5 cm. Mammographic images were processed with CAD. Targeted ultrasound is performed, showing a circumscribed oval hypoechoic mass with no associated vascular flow at 12 o'clock position 2 cm from the nipple in the right breast. This mass measures 0.8 x 0.7 x 0.5 cm and has features suggestive of a benign fibroadenoma. Ultrasound of the upper-outer quadrant of the left breast shows a 0.6 x 0.4 x 0.3 cm mixed echogenicity but predominantly cystic  appearing nodule at 2 o'clock position 10 cm from the nipple. There is no associated vascular flow. IMPRESSION: 1. Probable fibroadenoma 12 o'clock position of the right breast. 2. Probably benign nodule in the 2 o'clock position left breast 10 cm from the nipple. Considerations include focal fibrocystic changes or benign fat necrosis. RECOMMENDATION: Bilateral breast ultrasound is recommended in 6 months. The patient and I discussed that we follow probable fibroadenomas for 2 years. I have discussed the findings and recommendations with the patient. Results were also provided in writing at the conclusion of the visit. If applicable, a reminder letter will be sent to the patient regarding the next appointment. BI-RADS CATEGORY  3: Probably benign. Electronically Signed   By: Curlene Dolphin M.D.   On: 01/31/2018 17:08    ED COURSE  Nursing notes and initial vitals signs, including pulse oximetry, reviewed.  Vitals:   02/01/18 0622 02/01/18  0625  BP:  114/87  Pulse:  (!) 108  Resp:  18  Temp:  97.9 F (36.6 C)  TempSrc:  Oral  SpO2:  98%  Weight: 93.4 kg (206 lb)   Height: 5\' 4"  (1.626 m)    I do not believe the patient's fracture requires emergent reduction.  She has a postop shoe which she can use for ambulation.  She sees Dr. Barbaraann Barthel and can follow-up with him.  PROCEDURES    ED DIAGNOSES     ICD-10-CM   1. Closed nondisplaced fracture of proximal phalanx of lesser toe of right foot, initial encounter S92.514A        Nasiah Polinsky, Jenny Reichmann, MD 02/01/18 [phone]

## 2018-02-01 NOTE — ED Triage Notes (Signed)
Pt c/o 7/10 right pinky toe after she got to the bathroom and hit it by accident on a furniture. Some swollen noticed on her toe.

## 2018-02-06 ENCOUNTER — Ambulatory Visit: Payer: Medicaid Other | Admitting: Family Medicine

## 2018-02-06 ENCOUNTER — Encounter: Payer: Self-pay | Admitting: Family Medicine

## 2018-02-06 DIAGNOSIS — S99921A Unspecified injury of right foot, initial encounter: Secondary | ICD-10-CM

## 2018-02-06 NOTE — Patient Instructions (Signed)
Try the cam walker instead when up and walking around if this feels more comfortable than the postop shoe. Icing 15 minutes at a time 3-4 times a day. Take your prednisone - day after you finish this you can take 2 aleve twice a day with food OR ibuprofen 600mg  three times a day with food for pain and inflammation. Elevate above your heart as needed. Buddy taping can help support this but is difficult with a 5th toe fracture. Repeat x-rays are not necessary for this. Expect this to take 4-6 weeks to heal.

## 2018-02-08 ENCOUNTER — Encounter: Payer: Self-pay | Admitting: Family Medicine

## 2018-02-08 DIAGNOSIS — S99921A Unspecified injury of right foot, initial encounter: Secondary | ICD-10-CM | POA: Insufficient documentation

## 2018-02-08 NOTE — Assessment & Plan Note (Signed)
independently reviewed radiographs of 5th digit and noted transverse proximal phalanx fracture.  No malrotation or angulation on exam.  Reassured patient.  Postop shoe, icing.  Finish prednisone then can switch to aleve or ibuprofen.  Elevation.  Buddy taping if tolerated.  Expect about 4-6 weeks to heal.

## 2018-02-08 NOTE — Progress Notes (Signed)
PCP: Biagio Borg, MD  Subjective:   HPI: Patient is a 42 y.o. female here for right foot injury.  Patient reports early on 4/12 she accidentally kicked a riser with her right foot. Caused severe pain with swelling, deformity of her 5th digit. She went to ED and was diagnosed with 5th digit fracture, has been wearing postop shoe which helps. She is taking prednisone for her back which may be helping her foot. Pain is sharp, worse with ambulation. No other skin changes, numbness.  Past Medical History:  Diagnosis Date  . ADHD (attention deficit hyperactivity disorder)   . ALLERGIC RHINITIS 06/26/2008  . ANEMIA-IRON DEFICIENCY 06/26/2008  . 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  . ANKLE PAIN, LEFT 06/26/2008  . BACK PAIN, LUMBAR 10/15/2007  . BIPOLAR DISORDER UNSPECIFIED 09/13/2007  . CERVICAL RADICULOPATHY, LEFT 06/26/2008  . Cervicalgia 10/15/2007  . Chronic pain syndrome 03/21/2011  . COMMON MIGRAINE 06/26/2008  . DIABETES MELLITUS, TYPE II 06/05/2007  . DYSPNEA 12/05/2007  . EAR PAIN, RIGHT 12/05/2007  . ELEVATED BP READING WITHOUT DX HYPERTENSION 07/29/2009  . FATIGUE 12/05/2007  . FIBROMYALGIA 06/26/2008  . GASTROENTERITIS, ACUTE 12/15/2008  . GERD 06/26/2008  . Headache(784.0) 09/13/2007  . HYPERLIPIDEMIA 09/13/2007  . JOINT EFFUSION, RIGHT KNEE 07/29/2009  . KNEE PAIN, LEFT 11/21/2010  . LUMBAR RADICULOPATHY, LEFT 06/26/2008  . OTHER DISEASE OF PHARYNX OR NASOPHARYNX 07/03/2008  . PERIPHERAL EDEMA 01/06/2009  . POLYARTHRITIS 11/21/2010  . SHOULDER PAIN, BILATERAL 07/03/2008  . SINUSITIS- ACUTE-NOS 07/29/2009  . TACHYCARDIA 12/05/2007  . THYROID NODULE, RIGHT 11/20/2008  . TREMOR 01/02/2008  . Vertigo     Current Outpatient Medications on File Prior to Visit  Medication Sig Dispense Refill  . calcium-vitamin D (OSCAL WITH D) 500-200 MG-UNIT tablet Take 1 tablet by mouth.    . metFORMIN (GLUCOPHAGE) 500 MG tablet Take 1 tablet (500 mg total) by mouth 2 (two) times daily with a meal. 60 tablet 5  . Multiple  Vitamins-Minerals (MULTIVITAMIN WITH MINERALS) tablet Take 1 tablet by mouth daily.    . predniSONE (DELTASONE) 10 MG tablet 6 tabs po days 1-2, 5 tabs po days 3-4, 4 tabs po days 5-6, 3 tabs po days 7-8, 2 tabs po days 9-10, 1 tab po days 11-12 42 tablet 0  . Probiotic Product (PROBIOTIC PO) Take 1 tablet by mouth as needed.     Marland Kitchen tiZANidine (ZANAFLEX) 4 MG tablet Take 1 tablet (4 mg total) by mouth every 6 (six) hours as needed for muscle spasms. 60 tablet 1   No current facility-administered medications on file prior to visit.     Past Surgical History:  Procedure Laterality Date  . back surgury     lumbar 2001  . CESAREAN SECTION     x 3  . thyroid fine needle aspiration  May 2008   showed non neoplastic goiter  . VAGINAL HYSTERECTOMY     fibroids    Allergies  Allergen Reactions  . Sulfa Antibiotics Anaphylaxis  . Contrast Media [Iodinated Diagnostic Agents] Itching    Mri, severe heaving ~ can take benadryl without reaction  . Promethazine Hcl     Iv dose with benadryl causes severe aggrivation  . Tdap [Tetanus-Diphth-Acell Pertussis] Hives, Itching, Swelling, Anxiety and Rash    Social History   Socioeconomic History  . Marital status: Married    Spouse name: Not on file  . Number of children: 3  . Years of education: Not on file  . Highest education level: Not on file  Occupational History    Employer: UNEMPLOYED  Social Needs  . Financial resource strain: Not on file  . Food insecurity:    Worry: Not on file    Inability: Not on file  . Transportation needs:    Medical: Not on file    Non-medical: Not on file  Tobacco Use  . Smoking status: Never Smoker  . Smokeless tobacco: Never Used  Substance and Sexual Activity  . Alcohol use: No  . Drug use: No  . Sexual activity: Yes    Birth control/protection: Surgical  Lifestyle  . Physical activity:    Days per week: Not on file    Minutes per session: Not on file  . Stress: Not on file  Relationships   . Social connections:    Talks on phone: Not on file    Gets together: Not on file    Attends religious service: Not on file    Active member of club or organization: Not on file    Attends meetings of clubs or organizations: Not on file    Relationship status: Not on file  . Intimate partner violence:    Fear of current or ex partner: Not on file    Emotionally abused: Not on file    Physically abused: Not on file    Forced sexual activity: Not on file  Other Topics Concern  . Not on file  Social History Narrative  . Not on file    Family History  Problem Relation Age of Onset  . Thyroid disease Mother   . Diabetes Mother   . Asthma Mother   . Hypertension Father   . Hyperlipidemia Father   . Diabetes Father   . Emphysema Other   . Coronary artery disease Other   . Cancer Other        pancreatic and breast cancer  . Cancer Other        lung and prostate cancer  . Breast cancer Neg Hx     BP 133/84   Pulse (!) 123   Ht 5\' 4"  (1.626 m)   Wt 207 lb (93.9 kg)   BMI 35.53 kg/m   Review of Systems: See HPI above.     Objective:  Physical Exam:  Gen: NAD, comfortable in exam room  Right foot/ankle: Mild swelling, no bruising, malrotation, angulation of 5th digit. Able to flex and extend digits.  5/5 strength ankle all motions. TTP throughout 5th digit.  No metatarsal or other tenderness of foot or ankle. Negative ant drawer and talar tilt.   Negative syndesmotic compression. Negative metatarsal squeeze. Thompsons test negative. NV intact distally.   Assessment & Plan:  1. Right foot injury - independently reviewed radiographs of 5th digit and noted transverse proximal phalanx fracture.  No malrotation or angulation on exam.  Reassured patient.  Postop shoe, icing.  Finish prednisone then can switch to aleve or ibuprofen.  Elevation.  Buddy taping if tolerated.  Expect about 4-6 weeks to heal.

## 2018-02-11 ENCOUNTER — Encounter: Payer: Self-pay | Admitting: Physical Therapy

## 2018-02-11 ENCOUNTER — Ambulatory Visit: Payer: Medicaid Other | Attending: Family Medicine | Admitting: Physical Therapy

## 2018-02-11 ENCOUNTER — Other Ambulatory Visit: Payer: Self-pay

## 2018-02-11 DIAGNOSIS — G8929 Other chronic pain: Secondary | ICD-10-CM | POA: Diagnosis present

## 2018-02-11 DIAGNOSIS — M6281 Muscle weakness (generalized): Secondary | ICD-10-CM | POA: Diagnosis present

## 2018-02-11 DIAGNOSIS — M62830 Muscle spasm of back: Secondary | ICD-10-CM | POA: Diagnosis present

## 2018-02-11 DIAGNOSIS — M5416 Radiculopathy, lumbar region: Secondary | ICD-10-CM | POA: Diagnosis present

## 2018-02-11 DIAGNOSIS — M5441 Lumbago with sciatica, right side: Secondary | ICD-10-CM | POA: Diagnosis not present

## 2018-02-11 NOTE — Therapy (Signed)
[facility] [address]  [place], [place], [address] Phone: [phone]   Fax:  [phone]  Physical Therapy Evaluation  Patient Details  Name: Shirley [name] MRN: [id number] Date of Birth: 03-Jun-1976 Referring Provider: Karlton Lemon, MD   Encounter Date: 02/11/2018  PT End of Session - 02/11/18 1630    Visit Number  1    Number of Visits  4    Date for PT Re-Evaluation  03/08/18    Authorization Type  Medicaid     PT Start Time  1536    PT Stop Time  1625    PT Time Calculation (min)  49 min    Activity Tolerance  Patient tolerated treatment well    Behavior During Therapy  [facility] for tasks assessed/performed       Past Medical History:  Diagnosis Date  . ADHD (attention deficit hyperactivity disorder)   . ALLERGIC RHINITIS 06/26/2008  . ANEMIA-IRON DEFICIENCY 06/26/2008  . 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  . ANKLE PAIN, LEFT 06/26/2008  . BACK PAIN, LUMBAR 10/15/2007  . BIPOLAR DISORDER UNSPECIFIED 09/13/2007  . CERVICAL RADICULOPATHY, LEFT 06/26/2008  . Cervicalgia 10/15/2007  . Chronic pain syndrome 03/21/2011  . COMMON MIGRAINE 06/26/2008  . DIABETES MELLITUS, TYPE II 06/05/2007  . DYSPNEA 12/05/2007  . EAR PAIN, RIGHT 12/05/2007  . ELEVATED BP READING WITHOUT DX HYPERTENSION 07/29/2009  . FATIGUE 12/05/2007  . FIBROMYALGIA 06/26/2008  . GASTROENTERITIS, ACUTE 12/15/2008  . GERD 06/26/2008  . Headache(784.0) 09/13/2007  . HYPERLIPIDEMIA 09/13/2007  . JOINT EFFUSION, RIGHT KNEE 07/29/2009  . KNEE PAIN, LEFT 11/21/2010  . LUMBAR RADICULOPATHY, LEFT 06/26/2008  . OTHER DISEASE OF PHARYNX OR NASOPHARYNX 07/03/2008  . PERIPHERAL EDEMA 01/06/2009  . POLYARTHRITIS 11/21/2010  . SHOULDER PAIN, BILATERAL 07/03/2008  . SINUSITIS- ACUTE-NOS 07/29/2009  . TACHYCARDIA 12/05/2007  . THYROID NODULE, RIGHT 11/20/2008  . TREMOR 01/02/2008  . Vertigo     Past Surgical History:  Procedure Laterality Date  . back surgury     lumbar 2001  .  CESAREAN SECTION     x 3  . thyroid fine needle aspiration  May 2008   showed non neoplastic goiter  . VAGINAL HYSTERECTOMY     fibroids    There were no vitals filed for this visit.   Subjective Assessment - 02/11/18 1540    Subjective  Pt. reporting that she has been having pain in mid to low back, and has been experiencing this pain on/off for a couple years. No specific reason for onset, it just occured gradual over time. At times, the pain also radiates down the right leg. Pt. explain she feels weaker overall in B LE, and also states she has tightness in the posterior thigh. Pain is not currently limiting her ability to stand/walk, however with sitting she has to readjust frequently to decrease pain and find a comfortable position. Limited with some activities at work, such as lifting heavy trays.     Limitations  Sitting    How long can you sit comfortably?  -- can sit for longer periods, just requires frequent readjustments    Patient Stated Goals  Pt. wants to become more active; Learn how to self manage the pain    Currently in Pain?  No/denies    Pain Score  0-No pain Avg. 5-6/10     Pain Location  Back    Pain Orientation  Mid;Lower;Right;Left    Pain Descriptors / Indicators  Aching;Dull;[place]  Pain Type  Chronic pain    Pain Radiating Towards  Pain radiates down the R Leg     Pain Onset  More than a month ago    Pain Frequency  Intermittent    Aggravating Factors   Varying Movements     Pain Relieving Factors  Deep Breathing; Attempts to Yahoo; Hot/Cold    Effect of Pain on Daily Activities  Pain w/ Lifting Activities         [facility] PT Assessment - 02/11/18 1536      Assessment   Medical Diagnosis  Low Back Pain radiating to Right Leg    Referring Provider  Karlton Lemon, MD    Onset Date/Surgical Date  -- Approx. 2-3 years ago    Next MD Visit  02/21/2018    Prior Therapy  Yes (Once)      Balance Screen   Has the patient fallen in the past 6 months   No    Has the patient had a decrease in activity level because of a fear of falling?   No    Is the patient reluctant to leave their home because of a fear of falling?   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    Leisure  Spending time w/ Kids      Posture/Postural Control   Posture/Postural Control  Postural limitations    Postural Limitations  Rounded Shoulders      ROM / Strength   AROM / PROM / Strength  AROM;Strength      AROM   AROM Assessment Site  Lumbar    Lumbar Flexion  80%    Lumbar Extension  WFL    Lumbar - Right Side Bend  fingertips at knee    Lumbar - Left Side Bend  fingertips 1 inch above knee    Lumbar - Right Rotation  75%    Lumbar - Left Rotation  80%      Strength   Strength Assessment Site  Knee;Hip    Right/Left Hip  Right;Left    Right Hip Flexion  4/5    Right Hip Extension  4/5    Right Hip External Rotation   4/5    Right Hip Internal Rotation  4/5    Right Hip ABduction  4/5    Right Hip ADduction  4-/5    Left Hip Flexion  4/5    Left Hip Extension  4/5    Left Hip External Rotation  4/5    Left Hip Internal Rotation  4/5    Left Hip ABduction  4/5    Left Hip ADduction  4-/5    Right/Left Knee  Left;Right    Right Knee Flexion  5/5    Right Knee Extension  5/5    Left Knee Flexion  5/5    Left Knee Extension  5/5      Flexibility   Soft Tissue Assessment /Muscle Length  yes    Hamstrings  Mild/Mod Tightness in B Hamstrings     Quadriceps  Moderate Tightness in B Quads    ITB  Mild Tightness  in B ITB    Piriformis  Moderate Tightness in B Piriformis       Palpation   Palpation comment  TTP to B Thoracolumbar Paraspinals      Special Tests    Special Tests  Hip  Special Tests    Hip Special Tests   Ober's Test      Ober's Test   Findings  Negative    Side  Right;Left                Objective measurements completed on examination: See above findings.      [facility] PT Treatment/Exercise - 02/11/18 1536      Exercises   Exercises  Knee/Hip      Knee/Hip Exercises: Stretches   Passive Hamstring Stretch  Right;30 seconds    Passive Hamstring Stretch Limitations  Seated    Piriformis Stretch  Right;30 seconds    Piriformis Stretch Limitations  Seated w/ KTOS    Other Knee/Hip Stretches  3 way Seated Prayer Stretch x 30" w/ Orange Pball             PT Education - 02/11/18 1631    Education provided  Yes    Education Details  Pt. educated on evaluation findings, plan of care, and initial HEP     Person(s) Educated  Patient    Methods  Explanation;Demonstration;Handout    Comprehension  Returned demonstration;Verbalized understanding          PT Long Term Goals - 02/11/18 1647      PT LONG TERM GOAL #1   Title  Independent w/ initial and ongoing HEP     Status  New    Target Date  03/08/18      PT LONG TERM GOAL #2   Title  Pt. will report a 50% decrease in thoracolumbar back pain with work activities    Status  New    Target Date  03/08/18      PT LONG TERM GOAL #3   Title  Pt. will report/demonstrate understanding of proper posture/body mechanics to reduce strain on thoracolumbar region    Status  New    Target Date  03/08/18             Plan - 02/11/18 1639    Clinical Impression Statement  Shirley [name] is a 42 y.o. old female that presents to PT with chronic back pain, pain is bilateral and spans from thoracic to lumbar. Pt. presents w/ multiple comorbities, and a PSH of a disectomy in lumbar region in 2001. Pt. has been dealing with this pain for appox 2-3 years. Pain stays primarily in back however radiates into the R leg at times. Patient was unable to describe any specific factors that aggravate  the pain, but stated that it comes on with sudden/varying movements and work activities. Pt. presented w/ decreased ROM in lumbar region, and TTP to at B thoracolumbar paraspinals. Tightness was also noted in this region, as well in B hamstrings, B quadriceps, and B piriformis. Pt. also presented with decreased strength in B hip. Pt. will benefit from skilled physical therapy to address the impairments, and allow for a decreae in pain with work activities. Pt. will likely benefit from recert for additional therapy visits upon completion of initial three visits hopefully authorized by Medicaid.     History and Personal Factors relevant to plan of care:  Lumbar - Disectomy Surgery; Cervical/Lumbar Radiculopathy; Fibromyalgia; Chronic Pain Syndrome    Clinical Presentation  Evolving  Clinical Presentation due to:  Pt. presents with multiple comorbities    Clinical Decision Making  Moderate    Rehab Potential  Good    PT Frequency  1x / week    PT Duration  3 weeks    PT Treatment/Interventions  ADLs/Self Care Home Management;Cryotherapy;Electrical Stimulation;Ultrasound;Traction;Moist Heat;Iontophoresis 4mg /ml Dexamethasone;Therapeutic activities;Therapeutic exercise;Passive range of motion;Patient/family education;Neuromuscular re-education;Manual techniques;Taping;Dry needling    Consulted and Agree with Plan of Care  Patient       Patient will benefit from skilled therapeutic intervention in order to improve the following deficits and impairments:  Pain, Postural dysfunction, Increased muscle spasms, Decreased strength, Decreased range of motion, Decreased endurance, Decreased activity tolerance, Impaired flexibility  Visit Diagnosis: Chronic bilateral low back pain with right-sided sciatica  Radiculopathy, lumbar region  Muscle spasm of back  Muscle weakness (generalized)     Problem List Patient Active Problem List   Diagnosis Date Noted  . Right foot injury, initial encounter  02/08/2018  . Bilateral knee pain 01/14/2018  . MRSA (methicillin resistant staph aureus) urine culture positive on 07/06/16 07/06/2016  . Foot injury 12/20/2015  . Thoracic back pain 12/20/2015  . Low back pain radiating to right leg 12/01/2015  . Left shoulder pain 11/07/2013  . Left flank pain 11/07/2013  . Encounter for long-term (current) use of high-risk medication 05/29/2011  . Preventative health care 05/28/2011  . Chronic pain syndrome 03/21/2011  . KNEE PAIN, LEFT 11/21/2010  . POLYARTHRITIS 11/21/2010  . BACK PAIN 11/22/2009  . JOINT EFFUSION, RIGHT KNEE 07/29/2009  . ELEVATED BP READING WITHOUT DX HYPERTENSION 07/29/2009  . 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  . PERIPHERAL EDEMA 01/06/2009  . GASTROENTERITIS, ACUTE 12/15/2008  . DYSPHAGIA UNSPECIFIED 11/23/2008  . THYROID NODULE, RIGHT 11/20/2008  . SHOULDER PAIN, BILATERAL 07/03/2008  . ANEMIA-IRON DEFICIENCY 06/26/2008  . COMMON MIGRAINE 06/26/2008  . ALLERGIC RHINITIS 06/26/2008  . GERD 06/26/2008  . ANKLE PAIN, LEFT 06/26/2008  . CERVICAL RADICULOPATHY, LEFT 06/26/2008  . LUMBAR RADICULOPATHY, LEFT 06/26/2008  . FIBROMYALGIA 06/26/2008  . TREMOR 01/02/2008  . EAR PAIN, RIGHT 12/05/2007  . FATIGUE 12/05/2007  . TACHYCARDIA 12/05/2007  . DYSPNEA 12/05/2007  . Cervicalgia 10/15/2007  . BACK PAIN, LUMBAR 10/15/2007  . Pain in Soft Tissues of Limb 10/15/2007  . Hyperlipidemia 09/13/2007  . BIPOLAR DISORDER UNSPECIFIED 09/13/2007  . Headache(784.0) 09/13/2007  . Diabetes ([place]) 06/05/2007    Baldomero Lamy, SPT 02/11/2018, 5:27 PM  [facility] [address]  [place], [place], [address] Phone: [phone]   Fax:  [phone]  Name: Shirley [name] MRN: [id number] Date of Birth: 01-05-1976

## 2018-02-12 NOTE — Addendum Note (Signed)
Addended by: Percival Spanish on: 02/12/2018 06:56 PM   Modules accepted: Orders

## 2018-02-19 ENCOUNTER — Ambulatory Visit: Payer: Medicaid Other

## 2018-02-20 ENCOUNTER — Encounter: Payer: Self-pay | Admitting: Family Medicine

## 2018-02-20 ENCOUNTER — Ambulatory Visit (INDEPENDENT_AMBULATORY_CARE_PROVIDER_SITE_OTHER): Payer: Medicaid Other | Admitting: Family Medicine

## 2018-02-20 VITALS — BP 123/84 | HR 105 | Ht 64.5 in | Wt 197.0 lb

## 2018-02-20 DIAGNOSIS — N952 Postmenopausal atrophic vaginitis: Secondary | ICD-10-CM | POA: Diagnosis not present

## 2018-02-20 DIAGNOSIS — B373 Candidiasis of vulva and vagina: Secondary | ICD-10-CM | POA: Diagnosis not present

## 2018-02-20 DIAGNOSIS — E119 Type 2 diabetes mellitus without complications: Secondary | ICD-10-CM | POA: Diagnosis not present

## 2018-02-20 DIAGNOSIS — B3731 Acute candidiasis of vulva and vagina: Secondary | ICD-10-CM

## 2018-02-20 MED ORDER — LO LOESTRIN FE 1 MG-10 MCG / 10 MCG PO TABS: 1.0000 | ORAL_TABLET | Freq: Every day | ORAL | 3 refills | Status: DC

## 2018-02-20 MED ORDER — FLUCONAZOLE 150 MG PO TABS: 150.0000 mg | ORAL_TABLET | ORAL | 0 refills | Status: AC

## 2018-02-20 NOTE — Progress Notes (Signed)
Follow up from vulvovaginitis. Kathrene Alu RN

## 2018-02-20 NOTE — Progress Notes (Signed)
   Subjective:    Patient ID: Shirley [name], female    DOB: 06-13-1976, 42 y.o.   MRN: [id number]  HPI Patient seen for follow-up of atrophic vaginitis.  She was seen here approximately 2 months ago, was diagnosed with yeast vulvovaginitis and had blood testing which showed diabetes.  Patient was on prolonged course of Diflucan.  She reports that the abrasions during intercourse have stopped, but she continues to have vaginal dryness and hot flashes.   Review of Systems     Objective:   Physical Exam  Constitutional: She appears well-developed and well-nourished.  HENT:  Head: Normocephalic and atraumatic.  Genitourinary:  Genitourinary Comments: Vaginal mucosa mildly atrophic.  Also has some yeast discharge in the anterior fourchette.      Assessment & Plan:  1. Vulvovaginitis due to yeast Repeat Diflucan  2. Type 2 diabetes mellitus without complication, without long-term current use of insulin ([place]) Patient to follow-up with PCP for blood sugar control  3. Atrophic vaginitis We will start on hormone replacement to see if this is helpful.  Follow-up in 6 weeks

## 2018-02-21 ENCOUNTER — Ambulatory Visit: Payer: Medicaid Other | Admitting: Family Medicine

## 2018-02-21 ENCOUNTER — Encounter: Payer: Self-pay | Admitting: Family Medicine

## 2018-02-21 VITALS — Ht 64.0 in | Wt 197.0 lb

## 2018-02-21 DIAGNOSIS — M25561 Pain in right knee: Secondary | ICD-10-CM | POA: Diagnosis not present

## 2018-02-21 DIAGNOSIS — M25562 Pain in left knee: Secondary | ICD-10-CM | POA: Diagnosis not present

## 2018-02-21 DIAGNOSIS — G8929 Other chronic pain: Secondary | ICD-10-CM | POA: Diagnosis not present

## 2018-02-21 DIAGNOSIS — M79604 Pain in right leg: Secondary | ICD-10-CM

## 2018-02-21 DIAGNOSIS — M545 Low back pain, unspecified: Secondary | ICD-10-CM

## 2018-02-21 NOTE — Patient Instructions (Signed)
We will add physical therapy for your knees for patellar tendinitis to the therapy for your hip/back. We can consider nitro patches for your knees if not improving. Patellar tendon straps or taping as you have been as needed. I think the water exercise is a great idea. Continue with home exercises and physical therapy for your back. Use tizanidine as needed. Follow up with me in 6 weeks for all issues (30 minute appointment).

## 2018-02-23 ENCOUNTER — Encounter: Payer: Self-pay | Admitting: Family Medicine

## 2018-02-23 NOTE — Progress Notes (Signed)
PCP: Biagio Borg, MD  Subjective:   HPI: Patient is a 42 y.o. female here for right hip/back pain.  4/2: Patient reports on 4/1 she started to get pain posterolateral right hip. Worse with lying on her right side. Pain level 6/10 and sharp, up to 8/10 and worse with walking. Feels like she's 'sitting on a nerve' Tried massage, heat, tylenol. No numbness or tingling. No bowel/bladder dysfunction.  5/2: Patient returns reporting her hip/back are better. Pain level now 0/10 and only a twinge occasionally. Took prednisone and did well, felt much better. Takes tizanidine as needed but hasn't needed recently. Still with anterior knee pain bilaterally, a soreness. No skin changes, numbness.  Past Medical History:  Diagnosis Date  . ADHD (attention deficit hyperactivity disorder)   . ALLERGIC RHINITIS 06/26/2008  . ANEMIA-IRON DEFICIENCY 06/26/2008  . 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  . ANKLE PAIN, LEFT 06/26/2008  . BACK PAIN, LUMBAR 10/15/2007  . BIPOLAR DISORDER UNSPECIFIED 09/13/2007  . CERVICAL RADICULOPATHY, LEFT 06/26/2008  . Cervicalgia 10/15/2007  . Chronic pain syndrome 03/21/2011  . COMMON MIGRAINE 06/26/2008  . DIABETES MELLITUS, TYPE II 06/05/2007  . DYSPNEA 12/05/2007  . EAR PAIN, RIGHT 12/05/2007  . ELEVATED BP READING WITHOUT DX HYPERTENSION 07/29/2009  . FATIGUE 12/05/2007  . FIBROMYALGIA 06/26/2008  . GASTROENTERITIS, ACUTE 12/15/2008  . GERD 06/26/2008  . Headache(784.0) 09/13/2007  . HYPERLIPIDEMIA 09/13/2007  . JOINT EFFUSION, RIGHT KNEE 07/29/2009  . KNEE PAIN, LEFT 11/21/2010  . LUMBAR RADICULOPATHY, LEFT 06/26/2008  . OTHER DISEASE OF PHARYNX OR NASOPHARYNX 07/03/2008  . PERIPHERAL EDEMA 01/06/2009  . POLYARTHRITIS 11/21/2010  . SHOULDER PAIN, BILATERAL 07/03/2008  . SINUSITIS- ACUTE-NOS 07/29/2009  . TACHYCARDIA 12/05/2007  . THYROID NODULE, RIGHT 11/20/2008  . TREMOR 01/02/2008  . Vertigo     Current Outpatient Medications on File Prior to Visit  Medication Sig Dispense Refill   . calcium-vitamin D (OSCAL WITH D) 500-200 MG-UNIT tablet Take 1 tablet by mouth.    . fluconazole (DIFLUCAN) 150 MG tablet Take 1 tablet (150 mg total) by mouth every 3 (three) days for 14 days. 5 tablet 0  . LO LOESTRIN FE 1 MG-10 MCG / 10 MCG tablet Take 1 tablet by mouth daily. 3 Package 3  . metFORMIN (GLUCOPHAGE) 500 MG tablet Take 1 tablet (500 mg total) by mouth 2 (two) times daily with a meal. 60 tablet 5  . Multiple Vitamins-Minerals (MULTIVITAMIN WITH MINERALS) tablet Take 1 tablet by mouth daily.    . predniSONE (DELTASONE) 10 MG tablet 6 tabs po days 1-2, 5 tabs po days 3-4, 4 tabs po days 5-6, 3 tabs po days 7-8, 2 tabs po days 9-10, 1 tab po days 11-12 42 tablet 0  . Probiotic Product (PROBIOTIC PO) Take 1 tablet by mouth as needed.     Marland Kitchen tiZANidine (ZANAFLEX) 4 MG tablet Take 1 tablet (4 mg total) by mouth every 6 (six) hours as needed for muscle spasms. 60 tablet 1   No current facility-administered medications on file prior to visit.     Past Surgical History:  Procedure Laterality Date  . back surgury     lumbar 2001  . CESAREAN SECTION     x 3  . thyroid fine needle aspiration  May 2008   showed non neoplastic goiter  . VAGINAL HYSTERECTOMY     fibroids    Allergies  Allergen Reactions  . Sulfa Antibiotics Anaphylaxis  . Contrast Media [Iodinated Diagnostic Agents] Itching    Mri, severe heaving ~  can take benadryl without reaction  . Promethazine Hcl     Iv dose with benadryl causes severe aggrivation  . Tdap [Tetanus-Diphth-Acell Pertussis] Hives, Itching, Swelling, Anxiety and Rash    Social History   Socioeconomic History  . Marital status: Married    Spouse name: Not on file  . Number of children: 3  . Years of education: Not on file  . Highest education level: Not on file  Occupational History    Employer: UNEMPLOYED  Social Needs  . Financial resource strain: Not on file  . Food insecurity:    Worry: Not on file    Inability: Not on file   . Transportation needs:    Medical: Not on file    Non-medical: Not on file  Tobacco Use  . Smoking status: Never Smoker  . Smokeless tobacco: Never Used  Substance and Sexual Activity  . Alcohol use: No  . Drug use: No  . Sexual activity: Yes    Birth control/protection: Surgical  Lifestyle  . Physical activity:    Days per week: Not on file    Minutes per session: Not on file  . Stress: Not on file  Relationships  . Social connections:    Talks on phone: Not on file    Gets together: Not on file    Attends religious service: Not on file    Active member of club or organization: Not on file    Attends meetings of clubs or organizations: Not on file    Relationship status: Not on file  . Intimate partner violence:    Fear of current or ex partner: Not on file    Emotionally abused: Not on file    Physically abused: Not on file    Forced sexual activity: Not on file  Other Topics Concern  . Not on file  Social History Narrative  . Not on file    Family History  Problem Relation Age of Onset  . Thyroid disease Mother   . Diabetes Mother   . Asthma Mother   . Hypertension Father   . Hyperlipidemia Father   . Diabetes Father   . Emphysema Other   . Coronary artery disease Other   . Cancer Other        pancreatic and breast cancer  . Cancer Other        lung and prostate cancer  . Breast cancer Neg Hx     Ht 5\' 4"  (1.626 m)   Wt 197 lb (89.4 kg)   BMI 33.81 kg/m   Review of Systems: See HPI above.     Objective:  Physical Exam:  Gen: NAD, comfortable in exam room  Back: No gross deformity, scoliosis. No TTP.  No midline or bony TTP. FROM. Strength LEs 5/5 all muscle groups.   2+ MSRs in patellar and achilles tendons, equal bilaterally. Negative SLRs. Sensation intact to light touch bilaterally. Negative logroll bilateral hips Negative fabers and piriformis stretches.  Bilateral knees: No gross deformity, ecchymoses, swelling. Trace tenderness  bilateral patellar tendons.  No other tenderness. FROM with 5/5 strength. Negative ant/post drawers. Negative valgus/varus testing. Negative lachmanns. Negative mcmurrays, apleys, patellar apprehension. NV intact distally.  Assessment & Plan:  1. Low back pain radiating to right hip - 2/2 lumbar radiculopathy, much improved with prednisone, HEP.  Physical therapy.  Tizanidine as needed.  2. Bilateral knee pain - 2/2 patellar tendinitis.  Add physical therapy for this.  Discussed rehab, water exercise.  Patellar tendon straps/taping.  Consider nitro patches.  F/u in 6 weeks.

## 2018-02-23 NOTE — Assessment & Plan Note (Signed)
2/2 patellar tendinitis.  Add physical therapy for this.  Discussed rehab, water exercise.  Patellar tendon straps/taping.  Consider nitro patches.  F/u in 6 weeks.

## 2018-02-23 NOTE — Assessment & Plan Note (Signed)
2/2 lumbar radiculopathy, much improved with prednisone, HEP.  Physical therapy.  Tizanidine as needed.

## 2018-02-25 ENCOUNTER — Ambulatory Visit: Payer: Medicaid Other | Admitting: Family Medicine

## 2018-02-26 ENCOUNTER — Ambulatory Visit: Payer: Medicaid Other | Attending: Family Medicine | Admitting: Physical Therapy

## 2018-02-26 ENCOUNTER — Encounter: Payer: Self-pay | Admitting: Physical Therapy

## 2018-02-26 DIAGNOSIS — M62830 Muscle spasm of back: Secondary | ICD-10-CM

## 2018-02-26 DIAGNOSIS — M6281 Muscle weakness (generalized): Secondary | ICD-10-CM | POA: Diagnosis present

## 2018-02-26 DIAGNOSIS — M5416 Radiculopathy, lumbar region: Secondary | ICD-10-CM | POA: Diagnosis present

## 2018-02-26 DIAGNOSIS — G8929 Other chronic pain: Secondary | ICD-10-CM | POA: Insufficient documentation

## 2018-02-26 DIAGNOSIS — M25561 Pain in right knee: Secondary | ICD-10-CM | POA: Diagnosis present

## 2018-02-26 DIAGNOSIS — M5441 Lumbago with sciatica, right side: Secondary | ICD-10-CM | POA: Diagnosis present

## 2018-02-26 DIAGNOSIS — M25562 Pain in left knee: Secondary | ICD-10-CM | POA: Diagnosis present

## 2018-02-26 NOTE — Therapy (Signed)
[facility] [address]  [place], [place], [address] Phone: [phone]   Fax:  [phone]  Physical Therapy Treatment  Patient Details  Name: Shirley [name] MRN: [id number] Date of Birth: 04/24/1976 Referring Provider: Karlton Lemon, MD   Encounter Date: 02/26/2018  PT End of Session - 02/26/18 1537    Visit Number  2    Number of Visits  4    Date for PT Re-Evaluation  03/08/18    Authorization Type  Medicaid     Authorization Time Period  02/19/18-03/11/18    Authorization - Visit Number  1    Authorization - Number of Visits  3    PT Start Time  3299    PT Stop Time  1535    PT Time Calculation (min)  50 min    Activity Tolerance  Patient tolerated treatment well    Behavior During Therapy  [facility] for tasks assessed/performed       Past Medical History:  Diagnosis Date  . ADHD (attention deficit hyperactivity disorder)   . ALLERGIC RHINITIS 06/26/2008  . ANEMIA-IRON DEFICIENCY 06/26/2008  . 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  . ANKLE PAIN, LEFT 06/26/2008  . BACK PAIN, LUMBAR 10/15/2007  . BIPOLAR DISORDER UNSPECIFIED 09/13/2007  . CERVICAL RADICULOPATHY, LEFT 06/26/2008  . Cervicalgia 10/15/2007  . Chronic pain syndrome 03/21/2011  . COMMON MIGRAINE 06/26/2008  . DIABETES MELLITUS, TYPE II 06/05/2007  . DYSPNEA 12/05/2007  . EAR PAIN, RIGHT 12/05/2007  . ELEVATED BP READING WITHOUT DX HYPERTENSION 07/29/2009  . FATIGUE 12/05/2007  . FIBROMYALGIA 06/26/2008  . GASTROENTERITIS, ACUTE 12/15/2008  . GERD 06/26/2008  . Headache(784.0) 09/13/2007  . HYPERLIPIDEMIA 09/13/2007  . JOINT EFFUSION, RIGHT KNEE 07/29/2009  . KNEE PAIN, LEFT 11/21/2010  . LUMBAR RADICULOPATHY, LEFT 06/26/2008  . OTHER DISEASE OF PHARYNX OR NASOPHARYNX 07/03/2008  . PERIPHERAL EDEMA 01/06/2009  . POLYARTHRITIS 11/21/2010  . SHOULDER PAIN, BILATERAL 07/03/2008  . SINUSITIS- ACUTE-NOS 07/29/2009  . TACHYCARDIA 12/05/2007  . THYROID NODULE, RIGHT 11/20/2008  . TREMOR  01/02/2008  . Vertigo     Past Surgical History:  Procedure Laterality Date  . back surgury     lumbar 2001  . CESAREAN SECTION     x 3  . thyroid fine needle aspiration  May 2008   showed non neoplastic goiter  . VAGINAL HYSTERECTOMY     fibroids    There were no vitals filed for this visit.  Subjective Assessment - 02/26/18 1447    Subjective  Pt. reporting days that she has good/bad days, still experiencing some pain in the mid back. Patient reporting no pain in the knees at the time.     How long can you sit comfortably?  --    Patient Stated Goals  Pt. wants to become more active; Learn how to self manage the pain    Currently in Pain?  No/denies    Pain Score  0-No pain    Pain Onset  More than a month ago                       [facility] Adult PT Treatment/Exercise - 02/26/18 1445      Self-Care   Self-Care  Heat/Ice Application    Heat/Ice Application  Pt. educated on Ice Massage to Patellar Tendon for Pain Relief      Exercises   Exercises  Lumbar      Lumbar Exercises: Stretches   Passive Hamstring  Stretch  Right;30 seconds    Passive Hamstring Stretch Limitations  Seated    Piriformis Stretch  Right;30 seconds    Piriformis Stretch Limitations  Seated w/ KTOS      Lumbar Exercises: Standing   Row  Both;10 reps;Theraband    Theraband Level (Row)  Level 1 (Yellow)    Row Limitations  cueing for abdominal contraction    Shoulder Extension  Both;10 reps;Theraband    Theraband Level (Shoulder Extension)  Level 1 (Yellow)    Shoulder Extension Limitations  cueing for abdominal contraction      Knee/Hip Exercises: Stretches   Other Knee/Hip Stretches  3 way Seated Prayer Stretch x 30" w/ green Pball      Knee/Hip Exercises: Aerobic   Recumbent Bike  L2 x 6'       Knee/Hip Exercises: Standing   Wall Squat  10 reps;3 seconds    Other Standing Knee Exercises  Lateral Banded Walks w/ yellow TB 9ft x 2      Knee/Hip Exercises: Supine   Bridges   Both;10 reps    Other Supine Knee/Hip Exercises  lower trunk rotations on orange pball x 15 reps    Other Supine Knee/Hip Exercises  dead bugs x 10 reps             PT Education - 02/26/18 1543    Education provided  Yes    Education Details  HEP Update    Person(s) Educated  Patient    Methods  Explanation;Demonstration;Handout    Comprehension  Verbalized understanding;Returned demonstration          PT Long Term Goals - 02/26/18 1552      PT LONG TERM GOAL #1   Title  Independent w/ initial and ongoing HEP     Status  On-going      PT LONG TERM GOAL #2   Title  Pt. will report a 50% decrease in thoracolumbar back pain with work activities    Status  On-going      PT LONG TERM GOAL #3   Title  Pt. will report/demonstrate understanding of proper posture/body mechanics to reduce strain on thoracolumbar region    Status  On-going            Plan - 02/26/18 1545    Clinical Impression Statement  Shirley [name] reporting that she has pain intermittent in mid to low back, in which some days are worse than others. She has also began to experience pain in bilateral knees in which a new referral has been recieved by MD for bilateral patellar tendonitis. Pt. currently reporting back pain is the bigger issue therefore focusing more on this, and will assess knees at a later date. However at this time, educated on ice massage to knee for pain relief. Reviewed previous HEP for appropriate completion, and initiated strengthening of the thoracolumbar paraspinals and abdominals to help support lumbar spine. Pt. overall tolerated treatment well.     Rehab Potential  Good    PT Frequency  --    PT Duration  --    PT Treatment/Interventions  ADLs/Self Care Home Management;Cryotherapy;Electrical Stimulation;Ultrasound;Traction;Moist Heat;Iontophoresis 4mg /ml Dexamethasone;Therapeutic activities;Therapeutic exercise;Passive range of motion;Patient/family education;Neuromuscular  re-education;Manual techniques;Taping;Dry needling    Consulted and Agree with Plan of Care  Patient       Patient will benefit from skilled therapeutic intervention in order to improve the following deficits and impairments:  Pain, Postural dysfunction, Increased muscle spasms, Decreased strength, Decreased range of motion, Decreased endurance, Decreased activity  tolerance, Impaired flexibility  Visit Diagnosis: Chronic bilateral low back pain with right-sided sciatica  Radiculopathy, lumbar region  Muscle spasm of back  Muscle weakness (generalized)     Problem List Patient Active Problem List   Diagnosis Date Noted  . Right foot injury, initial encounter 02/08/2018  . Bilateral knee pain 01/14/2018  . MRSA (methicillin resistant staph aureus) urine culture positive on 07/06/16 07/06/2016  . Foot injury 12/20/2015  . Thoracic back pain 12/20/2015  . Low back pain radiating to right leg 12/01/2015  . Left shoulder pain 11/07/2013  . Left flank pain 11/07/2013  . Encounter for long-term (current) use of high-risk medication 05/29/2011  . Preventative health care 05/28/2011  . Chronic pain syndrome 03/21/2011  . KNEE PAIN, LEFT 11/21/2010  . POLYARTHRITIS 11/21/2010  . BACK PAIN 11/22/2009  . JOINT EFFUSION, RIGHT KNEE 07/29/2009  . ELEVATED BP READING WITHOUT DX HYPERTENSION 07/29/2009  . 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  . PERIPHERAL EDEMA 01/06/2009  . GASTROENTERITIS, ACUTE 12/15/2008  . DYSPHAGIA UNSPECIFIED 11/23/2008  . THYROID NODULE, RIGHT 11/20/2008  . SHOULDER PAIN, BILATERAL 07/03/2008  . ANEMIA-IRON DEFICIENCY 06/26/2008  . COMMON MIGRAINE 06/26/2008  . ALLERGIC RHINITIS 06/26/2008  . GERD 06/26/2008  . ANKLE PAIN, LEFT 06/26/2008  . CERVICAL RADICULOPATHY, LEFT 06/26/2008  . LUMBAR RADICULOPATHY, LEFT 06/26/2008  . FIBROMYALGIA 06/26/2008  . TREMOR 01/02/2008  . EAR PAIN, RIGHT 12/05/2007  . FATIGUE 12/05/2007  . TACHYCARDIA 12/05/2007  . DYSPNEA 12/05/2007   . Cervicalgia 10/15/2007  . BACK PAIN, LUMBAR 10/15/2007  . Pain in Soft Tissues of Limb 10/15/2007  . Hyperlipidemia 09/13/2007  . BIPOLAR DISORDER UNSPECIFIED 09/13/2007  . Headache(784.0) 09/13/2007  . Diabetes ([place]) 06/05/2007    Baldomero Lamy, SPT 02/26/2018, 3:55 PM  [facility] [address]  [place], [place], [address] Phone: [phone]   Fax:  [phone]  Name: Shirley [name] MRN: [id number] Date of Birth: 1976/04/09

## 2018-03-05 ENCOUNTER — Ambulatory Visit: Payer: Medicaid Other | Admitting: Physical Therapy

## 2018-03-05 ENCOUNTER — Encounter: Payer: Self-pay | Admitting: Physical Therapy

## 2018-03-05 DIAGNOSIS — M62830 Muscle spasm of back: Secondary | ICD-10-CM

## 2018-03-05 DIAGNOSIS — M6281 Muscle weakness (generalized): Secondary | ICD-10-CM

## 2018-03-05 DIAGNOSIS — M5416 Radiculopathy, lumbar region: Secondary | ICD-10-CM

## 2018-03-05 DIAGNOSIS — M25561 Pain in right knee: Secondary | ICD-10-CM

## 2018-03-05 DIAGNOSIS — M5441 Lumbago with sciatica, right side: Principal | ICD-10-CM

## 2018-03-05 DIAGNOSIS — G8929 Other chronic pain: Secondary | ICD-10-CM

## 2018-03-05 DIAGNOSIS — M25562 Pain in left knee: Secondary | ICD-10-CM

## 2018-03-06 NOTE — Therapy (Signed)
[facility] [address]  [place], [place], [address] Phone: [phone]   Fax:  [phone]  Physical Therapy Treatment  Patient Details  Name: Shirley [name] MRN: [id number] Date of Birth: [birth year] Referring Provider: Karlton Lemon, MD   Encounter Date: 03/05/2018  PT End of Session - 03/05/18 1445    Visit Number  3    Number of Visits  16    Date for PT Re-Evaluation  04/19/18    Authorization Type  Medicaid     Authorization Time Period  02/19/18-03/11/18    Authorization - Visit Number  2    Authorization - Number of Visits  3    PT Start Time  6761    PT Stop Time  1534    PT Time Calculation (min)  49 min    Activity Tolerance  Patient tolerated treatment well    Behavior During Therapy  [facility] for tasks assessed/performed       Past Medical History:  Diagnosis Date  . ADHD (attention deficit hyperactivity disorder)   . ALLERGIC RHINITIS 06/26/2008  . ANEMIA-IRON DEFICIENCY 06/26/2008  . 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  . ANKLE PAIN, LEFT 06/26/2008  . BACK PAIN, LUMBAR 10/15/2007  . BIPOLAR DISORDER UNSPECIFIED 09/13/2007  . CERVICAL RADICULOPATHY, LEFT 06/26/2008  . Cervicalgia 10/15/2007  . Chronic pain syndrome 03/21/2011  . COMMON MIGRAINE 06/26/2008  . DIABETES MELLITUS, TYPE II 06/05/2007  . DYSPNEA 12/05/2007  . EAR PAIN, RIGHT 12/05/2007  . ELEVATED BP READING WITHOUT DX HYPERTENSION 07/29/2009  . FATIGUE 12/05/2007  . FIBROMYALGIA 06/26/2008  . GASTROENTERITIS, ACUTE 12/15/2008  . GERD 06/26/2008  . Headache(784.0) 09/13/2007  . HYPERLIPIDEMIA 09/13/2007  . JOINT EFFUSION, RIGHT KNEE 07/29/2009  . KNEE PAIN, LEFT 11/21/2010  . LUMBAR RADICULOPATHY, LEFT 06/26/2008  . OTHER DISEASE OF PHARYNX OR NASOPHARYNX 07/03/2008  . PERIPHERAL EDEMA 01/06/2009  . POLYARTHRITIS 11/21/2010  . SHOULDER PAIN, BILATERAL 07/03/2008  . SINUSITIS- ACUTE-NOS 07/29/2009  . TACHYCARDIA 12/05/2007  . THYROID NODULE, RIGHT 11/20/2008  .  TREMOR 01/02/2008  . Vertigo     Past Surgical History:  Procedure Laterality Date  . back surgury     lumbar 2001  . CESAREAN SECTION     x 3  . thyroid fine needle aspiration  May 2008   showed non neoplastic goiter  . VAGINAL HYSTERECTOMY     fibroids    There were no vitals filed for this visit.  Subjective Assessment - 03/05/18 1449    Subjective  Pt reporting episode of upper back pain over the weekend which she was able to resolve with some stretches. Noting some intermittent knee pain and feels like she would like to proceed with the eval for her knees today.    Patient Stated Goals  Pt. wants to become more active; Learn how to self manage the pain    Currently in Pain?  Yes    Pain Score  2     Pain Location  Back    Pain Orientation  Upper    Pain Descriptors / Indicators  Dull    Pain Type  Acute pain;Chronic pain    Pain Frequency  Intermittent    Multiple Pain Sites  Yes    Pain Score  2    Pain Location  Knee    Pain Orientation  Right;Left    Pain Descriptors / Indicators  Dull    Pain Type  Chronic pain    Pain Radiating Towards  n/a    Pain Onset  More than a month ago ~1 yr    Pain Frequency  Intermittent    Aggravating Factors   prolonged standing    Pain Relieving Factors  massage, topical spray    Effect of Pain on Daily Activities  difficulty with stair negotiation         [facility] PT Assessment - 03/05/18 1445      Assessment   Medical Diagnosis  ChronicB knee pain - patellar tendinitis    Referring Provider  Karlton Lemon, MD    Next MD Visit  04/04/18      Balance Screen   Has the patient fallen in the past 6 months  No    Has the patient had a decrease in activity level because of a fear of falling?   No    Is the patient reluctant to leave their home because of a fear of falling?   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    Leisure  Spending time w/ Kids      ROM / Strength   AROM / PROM / Strength  AROM;Strength;PROM      AROM   AROM Assessment Site  Knee    Right/Left Knee  Right;Left    Right Knee Extension  13 LAQ    Right Knee Flexion  130    Left Knee Extension  11 LAQ    Left Knee Flexion  134      PROM   PROM Assessment Site  Knee    Right/Left Knee  Right;Left    Right Knee Extension  -11    Left Knee Extension  -11      Strength   Right Hip Flexion  4-/5    Right Hip Extension  4-/5    Right Hip External Rotation   4/5    Right Hip Internal Rotation  4/5    Right Hip ABduction  4-/5    Right Hip ADduction  4-/5    Left Hip Flexion  4-/5    Left Hip Extension  4-/5    Left Hip External Rotation  4/5    Left Hip Internal Rotation  4/5    Left Hip ABduction  4/5    Left Hip ADduction  4-/5    Right Knee Flexion  4+/5    Right Knee Extension  4/5 limited strength at end range extension    Left Knee Flexion  4+/5    Left Knee Extension  4/5 limited strength at end range extension      Palpation   Palpation comment  TTP over medial joint line & patellar tendon                   [facility] Adult PT Treatment/Exercise - 03/05/18 1445      Exercises   Exercises  Knee/Hip      Knee/Hip Exercises: Stretches   ITB Stretch  Both;30 seconds;2 reps    ITB Stretch Limitations  standing lateral lean      Knee/Hip Exercises: Aerobic   Recumbent Bike  L2 x 6'  Knee/Hip Exercises: Standing   Terminal Knee Extension  Both;10 reps;Strengthening 5" hold    Terminal Knee Extension Limitations  TKE with ball on wall to prevent hyperextension      Knee/Hip Exercises: Seated   Long Arc Quad  Right;Left;10 reps    Long Arc Quad Limitations  + hip adduction ball squeeze             PT Education - 03/05/18 1534     Education provided  Yes    Education Details  Knee eval findings, anticipated POC & initial knee HEP    Person(s) Educated  Patient    Methods  Explanation;Demonstration;Handout    Comprehension  Verbalized understanding;Returned demonstration          PT Long Term Goals - 03/05/18 1534      PT LONG TERM GOAL #1   Title  Independent with ongoing HEP     Status  Partially Met    Target Date  04/19/18      PT LONG TERM GOAL #2   Title  Pt. will report a 50% decrease in thoracolumbar back pain with work activities    Status  On-going      PT LONG TERM GOAL #3   Title  Pt. will report/demonstrate understanding of proper posture/body mechanics to reduce strain on thoracolumbar region    Status  On-going      PT LONG TERM GOAL #4   Title  Pt will demonstrate B knee AROM to >/= 0 dg in sitting for improved control of TKE    Status  New    Target Date  04/19/18      PT LONG TERM GOAL #5   Title  B hip and knee strength >/= 4+/5 for improved stability    Status  New    Target Date  04/19/18      PT LONG TERM GOAL #6   Title  Pt will demonstrate static standing posture with neutral knee alignment, avoiding excessive genu recurvatum    Status  New    Target Date  04/19/18      PT LONG TERM GOAL #7   Title  Pt will report ability to stand to complete normal work shift w/o limitation d/t low back or knee pain    Status  New    Target Date  04/19/18            Plan - 03/05/18 1534    Clinical Impression Statement  Shaliyah received a new referral for B chronic knee pain due to patellar tendinitis - she initially deferred the knee eval, wishing to focus on her back pain, but now feels back pain is becoming better able to manage and wanted to proceed with knee eval. Pt reports anterior knee pain R>L of ~1 yr duration, aggravated by prolonged standing. Pt with >10 dg genu recurvatum bilaterally and tends to stands with knees locked in hyperextension. Seated AROM B knee extension  lacking full extension by >10dg, however B flexion ROM WFL. Overall hip and knee strength grossly 4-/5 to 4+/5 however TKE strength more impaired. Mild to moderate proximal LE tightness and increased muscle tension, esp in ITB and lateral quads/HS, also potentially impacting patellar alignment/tracking and contributing to her knee pain. 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 Will plan  for Medicaid reauthorization request for additional 12 visits as of next visit (last remaining visit in initial authorization).     Rehab Potential  Good    PT Frequency  2x / week    PT Duration  6 weeks    PT Treatment/Interventions  ADLs/Self Care Home Management;Cryotherapy;Electrical Stimulation;Ultrasound;Traction;Moist Heat;Iontophoresis 57m/ml Dexamethasone;Therapeutic activities;Therapeutic exercise;Passive range of motion;Patient/family education;Neuromuscular re-education;Manual techniques;Taping;Dry needling;Vasopneumatic Device;Gait training;Stair training    Consulted and Agree with Plan of Care  Patient       Patient will benefit from skilled therapeutic intervention in order to improve the following deficits and impairments:  Pain, Postural dysfunction, Increased muscle spasms, Decreased strength, Decreased range of motion, Decreased endurance, Decreased activity tolerance, Impaired flexibility  Visit Diagnosis: Chronic bilateral low back pain with right-sided sciatica  Radiculopathy, lumbar region  Muscle spasm of back  Muscle weakness (generalized)  Chronic pain of right knee  Chronic pain of left knee     Problem List Patient Active Problem List   Diagnosis Date Noted  . Right foot injury, initial encounter 02/08/2018  . Bilateral knee pain 01/14/2018  . MRSA  (methicillin resistant staph aureus) urine culture positive on 07/06/16 07/06/2016  . Foot injury 12/20/2015  . Thoracic back pain 12/20/2015  . Low back pain radiating to right leg 12/01/2015  . Left shoulder pain 11/07/2013  . Left flank pain 11/07/2013  . Encounter for long-term (current) use of high-risk medication 05/29/2011  . Preventative health care 05/28/2011  . Chronic pain syndrome 03/21/2011  . KNEE PAIN, LEFT 11/21/2010  . POLYARTHRITIS 11/21/2010  . BACK PAIN 11/22/2009  . JOINT EFFUSION, RIGHT KNEE 07/29/2009  . ELEVATED BP READING WITHOUT DX HYPERTENSION 07/29/2009  . 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  . PERIPHERAL EDEMA 01/06/2009  . GASTROENTERITIS, ACUTE 12/15/2008  . DYSPHAGIA UNSPECIFIED 11/23/2008  . THYROID NODULE, RIGHT 11/20/2008  . SHOULDER PAIN, BILATERAL 07/03/2008  . ANEMIA-IRON DEFICIENCY 06/26/2008  . COMMON MIGRAINE 06/26/2008  . ALLERGIC RHINITIS 06/26/2008  . GERD 06/26/2008  . ANKLE PAIN, LEFT 06/26/2008  . CERVICAL RADICULOPATHY, LEFT 06/26/2008  . LUMBAR RADICULOPATHY, LEFT 06/26/2008  . FIBROMYALGIA 06/26/2008  . TREMOR 01/02/2008  . EAR PAIN, RIGHT 12/05/2007  . FATIGUE 12/05/2007  . TACHYCARDIA 12/05/2007  . DYSPNEA 12/05/2007  . Cervicalgia 10/15/2007  . BACK PAIN, LUMBAR 10/15/2007  . Pain in Soft Tissues of Limb 10/15/2007  . Hyperlipidemia 09/13/2007  . BIPOLAR DISORDER UNSPECIFIED 09/13/2007  . Headache(784.0) 09/13/2007  . Diabetes ([place] 06/05/2007    JPercival Spanish PT, MPT 03/06/2018, 10:59 AM  [facility] [place] [address]Phone: [phone]  Fax:  [phone] Name: PLAVRA IMLERMRN: [id number] of Birth: [birth year]

## 2018-03-07 ENCOUNTER — Ambulatory Visit: Payer: Medicaid Other | Admitting: Physical Therapy

## 2018-03-08 ENCOUNTER — Other Ambulatory Visit: Payer: Self-pay

## 2018-03-08 ENCOUNTER — Emergency Department (HOSPITAL_BASED_OUTPATIENT_CLINIC_OR_DEPARTMENT_OTHER)
Admission: EM | Admit: 2018-03-08 | Discharge: 2018-03-08 | Disposition: A | Discharge status: Home / Self Care | Payer: Medicaid Other | Attending: Emergency Medicine | Admitting: Emergency Medicine

## 2018-03-08 ENCOUNTER — Encounter (HOSPITAL_BASED_OUTPATIENT_CLINIC_OR_DEPARTMENT_OTHER): Payer: Self-pay | Admitting: Emergency Medicine

## 2018-03-08 DIAGNOSIS — H9203 Otalgia, bilateral: Secondary | ICD-10-CM | POA: Insufficient documentation

## 2018-03-08 DIAGNOSIS — J029 Acute pharyngitis, unspecified: Secondary | ICD-10-CM | POA: Diagnosis not present

## 2018-03-08 DIAGNOSIS — Z7984 Long term (current) use of oral hypoglycemic drugs: Secondary | ICD-10-CM | POA: Insufficient documentation

## 2018-03-08 DIAGNOSIS — E119 Type 2 diabetes mellitus without complications: Secondary | ICD-10-CM | POA: Insufficient documentation

## 2018-03-08 DIAGNOSIS — Z79899 Other long term (current) drug therapy: Secondary | ICD-10-CM | POA: Diagnosis not present

## 2018-03-08 LAB — RAPID STREP SCREEN (MED CTR MEBANE ONLY): Streptococcus, Group A Screen (Direct): NEGATIVE

## 2018-03-08 MED ORDER — CETIRIZINE HCL 10 MG PO TABS: 10.0000 mg | ORAL_TABLET | Freq: Every day | ORAL | 0 refills | Status: DC

## 2018-03-08 MED FILL — CETIRIZINE HCL 10 MG TABLET: 10 | 100 days supply | Qty: 100 | Fill #0

## 2018-03-08 NOTE — ED Notes (Signed)
Pt states she has tried some OTC throat relief med, with min relief.

## 2018-03-08 NOTE — ED Notes (Signed)
Pt presents with c/o feeling like having swelling at rt side of throat, radiates to rt ear. Pt states now has bilateral ear pain, states child has been having flu like symptoms, child does not have strep throat per the MD that examined child

## 2018-03-08 NOTE — ED Notes (Signed)
RN Rod Holler started Ear wax removal

## 2018-03-08 NOTE — ED Notes (Signed)
ED Provider at bedside. 

## 2018-03-08 NOTE — Discharge Instructions (Signed)
It was my pleasure taking care of you today!   Fortunately, we did not see evidence of serious infection and can treat your symptoms. Continue using Flonase nasal spray. Take cetirizine daily.   Rest, drink plenty of fluids to be sure you are staying hydrated.   Please follow up with your primary doctor for discussion of your diagnoses and further evaluation after today's visit if symptoms persist longer than 7 days; Return to the ER for fevers, new or worsening symptoms, any additional concerns.

## 2018-03-08 NOTE — ED Provider Notes (Signed)
[facility] EMERGENCY DEPARTMENT Provider Note   CSN: [id number] Arrival date & time: 03/08/18  1435     History   Chief Complaint Chief Complaint  Patient presents with  . Otalgia    HPI Shirley [name] is a 42 y.o. female.  The history is provided by the patient and medical records. No language interpreter was used.  Otalgia  Associated symptoms include sore throat. Pertinent negatives include no abdominal pain, no diarrhea, no vomiting and no cough.   Shirley [name] is a 41 y.o. female  with a PMH as listed below  who presents to the Emergency Department complaining of sore throat and bilateral ear pain which began this morning. Child has been sick with URI symptoms recently. Child was seen by pediatrician - strep was negative, CXR with no PNA. Told it was "flu-like-symptoms". She has tried chloraseptic throat lozenges with little improvement. No other medications taken prior to arrival for symptoms. She notes having hx of similar due to allergies in the past. No fever, chills, cough, nasal congestion, chest pain, shortness of breath, abdominal pain, n/v/d.   Past Medical History:  Diagnosis Date  . ADHD (attention deficit hyperactivity disorder)   . ALLERGIC RHINITIS 06/26/2008  . ANEMIA-IRON DEFICIENCY 06/26/2008  . 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  . ANKLE PAIN, LEFT 06/26/2008  . BACK PAIN, LUMBAR 10/15/2007  . BIPOLAR DISORDER UNSPECIFIED 09/13/2007  . CERVICAL RADICULOPATHY, LEFT 06/26/2008  . Cervicalgia 10/15/2007  . Chronic pain syndrome 03/21/2011  . COMMON MIGRAINE 06/26/2008  . DIABETES MELLITUS, TYPE II 06/05/2007  . DYSPNEA 12/05/2007  . EAR PAIN, RIGHT 12/05/2007  . ELEVATED BP READING WITHOUT DX HYPERTENSION 07/29/2009  . FATIGUE 12/05/2007  . FIBROMYALGIA 06/26/2008  . GASTROENTERITIS, ACUTE 12/15/2008  . GERD 06/26/2008  . Headache(784.0) 09/13/2007  . HYPERLIPIDEMIA 09/13/2007  . JOINT EFFUSION, RIGHT KNEE 07/29/2009  . KNEE PAIN, LEFT 11/21/2010  . LUMBAR  RADICULOPATHY, LEFT 06/26/2008  . OTHER DISEASE OF PHARYNX OR NASOPHARYNX 07/03/2008  . PERIPHERAL EDEMA 01/06/2009  . POLYARTHRITIS 11/21/2010  . SHOULDER PAIN, BILATERAL 07/03/2008  . SINUSITIS- ACUTE-NOS 07/29/2009  . TACHYCARDIA 12/05/2007  . THYROID NODULE, RIGHT 11/20/2008  . TREMOR 01/02/2008  . Vertigo     Patient Active Problem List   Diagnosis Date Noted  . Right foot injury, initial encounter 02/08/2018  . Bilateral knee pain 01/14/2018  . MRSA (methicillin resistant staph aureus) urine culture positive on 07/06/16 07/06/2016  . Foot injury 12/20/2015  . Thoracic back pain 12/20/2015  . Low back pain radiating to right leg 12/01/2015  . Left shoulder pain 11/07/2013  . Left flank pain 11/07/2013  . Encounter for long-term (current) use of high-risk medication 05/29/2011  . Preventative health care 05/28/2011  . Chronic pain syndrome 03/21/2011  . KNEE PAIN, LEFT 11/21/2010  . POLYARTHRITIS 11/21/2010  . BACK PAIN 11/22/2009  . JOINT EFFUSION, RIGHT KNEE 07/29/2009  . ELEVATED BP READING WITHOUT DX HYPERTENSION 07/29/2009  . 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  . PERIPHERAL EDEMA 01/06/2009  . GASTROENTERITIS, ACUTE 12/15/2008  . DYSPHAGIA UNSPECIFIED 11/23/2008  . THYROID NODULE, RIGHT 11/20/2008  . SHOULDER PAIN, BILATERAL 07/03/2008  . ANEMIA-IRON DEFICIENCY 06/26/2008  . COMMON MIGRAINE 06/26/2008  . ALLERGIC RHINITIS 06/26/2008  . GERD 06/26/2008  . ANKLE PAIN, LEFT 06/26/2008  . CERVICAL RADICULOPATHY, LEFT 06/26/2008  . LUMBAR RADICULOPATHY, LEFT 06/26/2008  . FIBROMYALGIA 06/26/2008  . TREMOR 01/02/2008  . EAR PAIN, RIGHT 12/05/2007  . FATIGUE 12/05/2007  . TACHYCARDIA 12/05/2007  . DYSPNEA 12/05/2007  . Cervicalgia  10/15/2007  . BACK PAIN, LUMBAR 10/15/2007  . Pain in Soft Tissues of Limb 10/15/2007  . Hyperlipidemia 09/13/2007  . BIPOLAR DISORDER UNSPECIFIED 09/13/2007  . Headache(784.0) 09/13/2007  . Diabetes ([place]) 06/05/2007    Past Surgical History:  Procedure  Laterality Date  . back surgury     lumbar 2001  . CESAREAN SECTION     x 3  . thyroid fine needle aspiration  May 2008   showed non neoplastic goiter  . VAGINAL HYSTERECTOMY     fibroids     OB History    Gravida  3   Para  3   Term  2   Preterm  1   AB      Living  3     SAB      TAB      Ectopic      Multiple      Live Births  3            Home Medications    Prior to Admission medications   Medication Sig Start Date End Date Taking? Authorizing Provider  calcium-vitamin D (OSCAL WITH D) 500-200 MG-UNIT tablet Take 1 tablet by mouth.    [provider]  cetirizine (ZYRTEC) 10 MG tablet Take 1 tablet (10 mg total) by mouth daily. 03/08/18   Erin Obando, Ozella Almond, PA-C  LO LOESTRIN FE 1 MG-10 MCG / 10 MCG tablet Take 1 tablet by mouth daily. 02/20/18   Truett Mainland, DO  metFORMIN (GLUCOPHAGE) 500 MG tablet Take 1 tablet (500 mg total) by mouth 2 (two) times daily with a meal. 01/11/18   Truett Mainland, DO  Multiple Vitamins-Minerals (MULTIVITAMIN WITH MINERALS) tablet Take 1 tablet by mouth daily.    [provider]  predniSONE (DELTASONE) 10 MG tablet 6 tabs po days 1-2, 5 tabs po days 3-4, 4 tabs po days 5-6, 3 tabs po days 7-8, 2 tabs po days 9-10, 1 tab po days 11-12 01/30/18   Hudnall, Sharyn Lull, MD  Probiotic Product (PROBIOTIC PO) Take 1 tablet by mouth as needed.     [provider]  tiZANidine (ZANAFLEX) 4 MG tablet Take 1 tablet (4 mg total) by mouth every 6 (six) hours as needed for muscle spasms. 01/22/18   Dene Gentry, MD    Family History Family History  Problem Relation Age of Onset  . Thyroid disease Mother   . Diabetes Mother   . Asthma Mother   . Hypertension Father   . Hyperlipidemia Father   . Diabetes Father   . Emphysema Other   . Coronary artery disease Other   . Cancer Other        pancreatic and breast cancer  . Cancer Other        lung and prostate cancer  . Breast cancer Neg Hx     Social  History Social History   Tobacco Use  . Smoking status: Never Smoker  . Smokeless tobacco: Never Used  Substance Use Topics  . Alcohol use: No  . Drug use: No     Allergies   Sulfa antibiotics; Contrast media [iodinated diagnostic agents]; Promethazine hcl; and Tdap [tetanus-diphth-acell pertussis]   Review of Systems Review of Systems  Constitutional: Negative for chills and fever.  HENT: Positive for ear pain and sore throat.   Respiratory: Negative for cough and shortness of breath.   Cardiovascular: Negative for chest pain.  Gastrointestinal: Negative for abdominal pain, diarrhea, nausea and vomiting.  Physical Exam Updated Vital Signs BP 128/90 (BP Location: Left Arm)   Pulse (!) 116   Temp 98.2 F (36.8 C) (Oral)   Resp 20   Ht 5\' 4"  (1.626 m)   Wt 89.4 kg (197 lb)   SpO2 100%   BMI 33.81 kg/m   Physical Exam  Constitutional: She is oriented to person, place, and time. She appears well-developed and well-nourished. No distress.  HENT:  Head: Normocephalic and atraumatic.  Bilateral cerumen impaction. OP with erythema, +PND. No exudates or tonsillar hypertrophy. No focal areas of sinus tenderness.  Cardiovascular: Regular rhythm and normal heart sounds.  No murmur heard. Tachycardic but regular.  Pulmonary/Chest: Effort normal and breath sounds normal. No respiratory distress.  Abdominal: Soft. She exhibits no distension. There is no tenderness.  Musculoskeletal: She exhibits no edema.  Neurological: She is alert and oriented to person, place, and time.  Skin: Skin is warm and dry.  Nursing note and vitals reviewed.    ED Treatments / Results  Labs (all labs ordered are listed, but only abnormal results are displayed) Labs Reviewed  RAPID STREP SCREEN ([facility] ONLY)  CULTURE, GROUP A STREP [facility])    EKG None  Radiology No results found.  Procedures Procedures (including critical care time)  Medications Ordered in ED Medications - No  data to display   Initial Impression / Assessment and Plan / ED Course  I have reviewed the triage vital signs and the nursing notes.  Pertinent labs & imaging results that were available during my care of the patient were reviewed by me and considered in my medical decision making (see chart for details).    Shirley [name] is a 42 y.o. female who presents to ED for bilateral ear pain and sore throat which began this morning. Child at home with similar sxs over the last week - diagnosed with viral symptoms.    On exam, patient is afebrile, non-toxic appearing with a clear lung exam. Mild rhinorrhea and OP with erythema but no exudates or tonsillar hypertrophy. No focal sinus tenderness. Bilateral cerumen impaction - ears cleaned by nursing staff. On re-evaluation, no signs of infection. TM's normal. Patient reports ears feel better.   Rapid strep negative.  Patient does have hx of tachycardia. HR today c/w similar visits to ER and urgent care in the past. No chest pain, shortness of breath, palpitations, etc. Will follow up with PCP.   Sxs today likely due to viral URI vs. Allergies..Symptomatic home care instructions discussed. Rx for zyrtec given at patient's request. PCP follow up strongly encouraged if symptoms persist. Reasons to return to ER discussed. All questions answered.   Blood pressure 128/90, pulse (!) 116, temperature 98.2 F (36.8 C), temperature source Oral, resp. rate 20, height 5\' 4"  (1.626 m), weight 89.4 kg (197 lb), SpO2 100 %.   Final Clinical Impressions(s) / ED Diagnoses   Final diagnoses:  Otalgia of both ears  Sore throat    ED Discharge Orders        Ordered    cetirizine (ZYRTEC) 10 MG tablet  Daily     03/08/18 1529       Angeliki Mates, Ozella Almond, PA-C 03/08/18 1619    Elnora Morrison, MD 03/09/18 2350

## 2018-03-08 NOTE — ED Notes (Signed)
Oral swab sample obtained by PA, sent to lab per orders

## 2018-03-08 NOTE — ED Triage Notes (Signed)
Pt reports R ear pain and swelling to the R side of her neck today.

## 2018-03-11 ENCOUNTER — Encounter (HOSPITAL_COMMUNITY): Payer: Self-pay | Admitting: Emergency Medicine

## 2018-03-11 ENCOUNTER — Ambulatory Visit (HOSPITAL_COMMUNITY)
Admission: EM | Admit: 2018-03-11 | Discharge: 2018-03-11 | Disposition: A | Discharge status: Home / Self Care | Payer: Medicaid Other | Attending: Family Medicine | Admitting: Family Medicine

## 2018-03-11 DIAGNOSIS — H9201 Otalgia, right ear: Secondary | ICD-10-CM | POA: Diagnosis not present

## 2018-03-11 MED ORDER — DICLOFENAC SODIUM 75 MG PO TBEC: 75.0000 mg | DELAYED_RELEASE_TABLET | Freq: Two times a day (BID) | ORAL | 0 refills | Status: DC

## 2018-03-11 NOTE — ED Triage Notes (Signed)
Pt here for right ear pain 

## 2018-03-11 NOTE — ED Provider Notes (Signed)
[place]   [id number] 03/11/18 Arrival Time: 1007  ASSESSMENT & PLAN:  1. Otalgia of right ear   -external  Meds ordered this encounter  Medications  . diclofenac (VOLTAREN) 75 MG EC tablet    Sig: Take 1 tablet (75 mg total) by mouth 2 (two) times daily.    Dispense:  14 tablet    Refill:  0   I do not see anything wrong with her ear on exam. Reassured. NSAID for discomfort. No sign of infection. To watch closely over the next few days and f/u with any changes or worsening.  Reviewed expectations re: course of current medical issues. Questions answered. Outlined signs and symptoms indicating need for more acute intervention. Patient verbalized understanding. After Visit Summary given.   SUBJECTIVE: History from: patient.  Shirley [name] is a 42 y.o. female who presents with complaint of right otalgia (external ear at ear canal) without drainage. Onset gradual, approximately a few days ago. Recent cold symptoms: none. Fever: no. Overall normal PO intake without n/v. Sick contacts: no. OTC treatment: none. Reports normal hearing. No injury to ear.  Social History   Tobacco Use  Smoking Status Never Smoker  Smokeless Tobacco Never Used    ROS: As per HPI.   OBJECTIVE:  Vitals:   03/11/18 1102  BP: (!) 144/97  Pulse: (!) 107  Resp: 18  Temp: 97.6 F (36.4 C)  TempSrc: Oral  SpO2: 100%    Recheck pulse: 98  General appearance: alert; appears fatigued Ear Canal: normal TM: bilateral: normal Neck: supple without LAD Lungs: unlabored respirations, symmetrical air entry; no respiratory distress Skin: warm and dry Psychological: alert and cooperative; normal mood and affect  Allergies  Allergen Reactions  . Sulfa Antibiotics Anaphylaxis  . Contrast Media [Iodinated Diagnostic Agents] Itching    Mri, severe heaving ~ can take benadryl without reaction  . Promethazine Hcl     Iv dose with benadryl causes severe aggrivation  . Tdap  [Tetanus-Diphth-Acell Pertussis] Hives, Itching, Swelling, Anxiety and Rash    Past Medical History:  Diagnosis Date  . ADHD (attention deficit hyperactivity disorder)   . ALLERGIC RHINITIS 06/26/2008  . ANEMIA-IRON DEFICIENCY 06/26/2008  . 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  . ANKLE PAIN, LEFT 06/26/2008  . BACK PAIN, LUMBAR 10/15/2007  . BIPOLAR DISORDER UNSPECIFIED 09/13/2007  . CERVICAL RADICULOPATHY, LEFT 06/26/2008  . Cervicalgia 10/15/2007  . Chronic pain syndrome 03/21/2011  . COMMON MIGRAINE 06/26/2008  . DIABETES MELLITUS, TYPE II 06/05/2007  . DYSPNEA 12/05/2007  . EAR PAIN, RIGHT 12/05/2007  . ELEVATED BP READING WITHOUT DX HYPERTENSION 07/29/2009  . FATIGUE 12/05/2007  . FIBROMYALGIA 06/26/2008  . GASTROENTERITIS, ACUTE 12/15/2008  . GERD 06/26/2008  . Headache(784.0) 09/13/2007  . HYPERLIPIDEMIA 09/13/2007  . JOINT EFFUSION, RIGHT KNEE 07/29/2009  . KNEE PAIN, LEFT 11/21/2010  . LUMBAR RADICULOPATHY, LEFT 06/26/2008  . OTHER DISEASE OF PHARYNX OR NASOPHARYNX 07/03/2008  . PERIPHERAL EDEMA 01/06/2009  . POLYARTHRITIS 11/21/2010  . SHOULDER PAIN, BILATERAL 07/03/2008  . SINUSITIS- ACUTE-NOS 07/29/2009  . TACHYCARDIA 12/05/2007  . THYROID NODULE, RIGHT 11/20/2008  . TREMOR 01/02/2008  . Vertigo    Family History  Problem Relation Age of Onset  . Thyroid disease Mother   . Diabetes Mother   . Asthma Mother   . Hypertension Father   . Hyperlipidemia Father   . Diabetes Father   . Emphysema Other   . Coronary artery disease Other   . Cancer Other        pancreatic  and breast cancer  . Cancer Other        lung and prostate cancer  . Breast cancer Neg Hx    Social History   Socioeconomic History  . Marital status: Married    Spouse name: Not on file  . Number of children: 3  . Years of education: Not on file  . Highest education level: Not on file  Occupational History    Employer: UNEMPLOYED  Social Needs  . Financial resource strain: Not on file  . Food insecurity:    Worry: Not on file      Inability: Not on file  . Transportation needs:    Medical: Not on file    Non-medical: Not on file  Tobacco Use  . Smoking status: Never Smoker  . Smokeless tobacco: Never Used  Substance and Sexual Activity  . Alcohol use: No  . Drug use: No  . Sexual activity: Yes    Birth control/protection: Surgical  Lifestyle  . Physical activity:    Days per week: Not on file    Minutes per session: Not on file  . Stress: Not on file  Relationships  . Social connections:    Talks on phone: Not on file    Gets together: Not on file    Attends religious service: Not on file    Active member of club or organization: Not on file    Attends meetings of clubs or organizations: Not on file    Relationship status: Not on file  . Intimate partner violence:    Fear of current or ex partner: Not on file    Emotionally abused: Not on file    Physically abused: Not on file    Forced sexual activity: Not on file  Other Topics Concern  . Not on file  Social History Narrative  . Not on file            Vanessa Kick, MD 03/13/18 [phone]

## 2018-03-12 ENCOUNTER — Encounter: Payer: Medicaid Other | Admitting: Physical Therapy

## 2018-03-12 LAB — CULTURE, GROUP A STREP (THRC)

## 2018-03-26 ENCOUNTER — Ambulatory Visit: Payer: Medicaid Other | Attending: Family Medicine | Admitting: Physical Therapy

## 2018-03-26 ENCOUNTER — Encounter: Payer: Self-pay | Admitting: Physical Therapy

## 2018-03-26 DIAGNOSIS — M25562 Pain in left knee: Secondary | ICD-10-CM | POA: Diagnosis present

## 2018-03-26 DIAGNOSIS — M5441 Lumbago with sciatica, right side: Secondary | ICD-10-CM | POA: Insufficient documentation

## 2018-03-26 DIAGNOSIS — G8929 Other chronic pain: Secondary | ICD-10-CM

## 2018-03-26 DIAGNOSIS — M25561 Pain in right knee: Secondary | ICD-10-CM | POA: Insufficient documentation

## 2018-03-26 DIAGNOSIS — M62830 Muscle spasm of back: Secondary | ICD-10-CM | POA: Diagnosis present

## 2018-03-26 DIAGNOSIS — M6281 Muscle weakness (generalized): Secondary | ICD-10-CM | POA: Insufficient documentation

## 2018-03-26 DIAGNOSIS — M5416 Radiculopathy, lumbar region: Secondary | ICD-10-CM | POA: Diagnosis present

## 2018-03-26 NOTE — Therapy (Signed)
[facility] [address]  [place], [place], [address] Phone: [phone]   Fax:  [phone]  Physical Therapy Treatment  Patient Details  Name: Shirley [name] MRN: [id number] Date of Birth: 03-Mar-1976 Referring Provider: Karlton Lemon, MD   Encounter Date: 03/26/2018  PT End of Session - 03/26/18 1437    Visit Number  4    Number of Visits  16    Date for PT Re-Evaluation  04/19/18    Authorization Type  Medicaid     Authorization Time Period  03/26/18 - 05/06/18    Authorization - Visit Number  1    Authorization - Number of Visits  12    PT Start Time  2297    PT Stop Time  1528    PT Time Calculation (min)  51 min    Activity Tolerance  Patient tolerated treatment well    Behavior During Therapy  [facility] for tasks assessed/performed       Past Medical History:  Diagnosis Date  . ADHD (attention deficit hyperactivity disorder)   . ALLERGIC RHINITIS 06/26/2008  . ANEMIA-IRON DEFICIENCY 06/26/2008  . 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  . ANKLE PAIN, LEFT 06/26/2008  . BACK PAIN, LUMBAR 10/15/2007  . BIPOLAR DISORDER UNSPECIFIED 09/13/2007  . CERVICAL RADICULOPATHY, LEFT 06/26/2008  . Cervicalgia 10/15/2007  . Chronic pain syndrome 03/21/2011  . COMMON MIGRAINE 06/26/2008  . DIABETES MELLITUS, TYPE II 06/05/2007  . DYSPNEA 12/05/2007  . EAR PAIN, RIGHT 12/05/2007  . ELEVATED BP READING WITHOUT DX HYPERTENSION 07/29/2009  . FATIGUE 12/05/2007  . FIBROMYALGIA 06/26/2008  . GASTROENTERITIS, ACUTE 12/15/2008  . GERD 06/26/2008  . Headache(784.0) 09/13/2007  . HYPERLIPIDEMIA 09/13/2007  . JOINT EFFUSION, RIGHT KNEE 07/29/2009  . KNEE PAIN, LEFT 11/21/2010  . LUMBAR RADICULOPATHY, LEFT 06/26/2008  . OTHER DISEASE OF PHARYNX OR NASOPHARYNX 07/03/2008  . PERIPHERAL EDEMA 01/06/2009  . POLYARTHRITIS 11/21/2010  . SHOULDER PAIN, BILATERAL 07/03/2008  . SINUSITIS- ACUTE-NOS 07/29/2009  . TACHYCARDIA 12/05/2007  . THYROID NODULE, RIGHT 11/20/2008  .  TREMOR 01/02/2008  . Vertigo     Past Surgical History:  Procedure Laterality Date  . back surgury     lumbar 2001  . CESAREAN SECTION     x 3  . thyroid fine needle aspiration  May 2008   showed non neoplastic goiter  . VAGINAL HYSTERECTOMY     fibroids    There were no vitals filed for this visit.  Subjective Assessment - 03/26/18 1439    Subjective  Pt reports if she tries to lay too long on her back while sleeping, she ends up waking up stiff. States she has mutiple epsiodes on the weekend where the L knee was trying to lock up, esp when she was on the stairs, but has not noted this for the past 2 days.    Patient Stated Goals  Pt. wants to become more active; Learn how to self manage the pain    Currently in Pain?  No/denies    Pain Onset  More than a month ago ~1 yr                       [facility] PT Treatment/Exercise - 03/26/18 1437      Exercises   Exercises  Knee/Hip;Lumbar      Knee/Hip Exercises: Stretches   Gastroc Stretch  Right;Left;30 seconds;2 reps Futures trader Limitations  negative heel off edge of  step, standing at wall    Soleus Stretch  Right;Left;30 seconds;2 reps    Soleus Stretch Limitations  standing at wall      Knee/Hip Exercises: Aerobic   Recumbent Bike  L2 x 6'       Knee/Hip Exercises: Standing   Heel Raises  Both;10 reps;3 seconds;2 sets    Heel Raises Limitations  cues for abd bracing, glute & quad activation avoiding knee hyperextension    Hip Flexion  Right;Left;10 reps;Knee straight;Stengthening    Hip Flexion Limitations  red TB, cues for abd bracing & to avoid knee hyperextension on stance leg; 1 pole A for balance    Terminal Knee Extension  Left;Right;15 reps;Theraband;Strengthening    Theraband Level (Terminal Knee Extension)  Level 4 (Blue)    Terminal Knee Extension Limitations  cues to hold 3' at neutral extension (avoiding hyperextension)    Hip ADduction  Right;Left;10 reps;Strengthening    Hip  ADduction Limitations  red TB, cues for abd bracing & to avoid knee hyperextension on stance leg; 1 pole A for balance    Hip Abduction  Right;Left;10 reps;Knee straight;Stengthening    Abduction Limitations  red TB, cues for abd bracing & to avoid knee hyperextension on stance leg; 1 pole A for balance    Hip Extension  Right;Left;10 reps;Knee straight;Stengthening    Extension Limitations  red TB, cues for abd bracing & to avoid knee hyperextension on stance leg; 1 pole A for balance    Step Down  Right;Left;10 reps;Step Height: 6";Hand Hold: 2    Step Down Limitations  eccentric lowering with light heel touch (lateral step-down)    Functional Squat  10 reps;3 seconds    Functional Squat Limitations  counter squat - cues for abd bracing and upright posture    Wall Squat  10 reps;3 seconds    Wall Squat Limitations  + hip adduction ball squeeze             PT Education - 03/26/18 1528    Education provided  Yes    Education Details  HEP update - calf stretches & standing 4 way SLR with red TB    Person(s) Educated  Patient    Methods  Explanation;Demonstration;Handout    Comprehension  Verbalized understanding;Returned demonstration          PT Long Term Goals - 03/26/18 1443      PT LONG TERM GOAL #1   Title  Independent with ongoing HEP     Status  Partially Met      PT LONG TERM GOAL #2   Title  Pt. will report a 50% decrease in thoracolumbar back pain with work activities    Status  On-going      PT LONG TERM GOAL #3   Title  Pt. will report/demonstrate understanding of proper posture/body mechanics to reduce strain on thoracolumbar region    Status  On-going      PT LONG TERM GOAL #4   Title  Pt will demonstrate B knee AROM to >/= 0 dg in sitting for improved control of TKE    Status  On-going      PT LONG TERM GOAL #5   Title  B hip and knee strength >/= 4+/5 for improved stability    Status  On-going      PT LONG TERM GOAL #6   Title  Pt will demonstrate  static standing posture with neutral knee alignment, avoiding excessive genu recurvatum    Status  On-going  PT LONG TERM GOAL #7   Title  Pt will report ability to stand to complete normal work shift w/o limitation d/t low back or knee pain    Status  On-going            Plan - 03/26/18 1443    Clinical Impression Statement  Shirley [name] reporting improving awareness of avoiding locking knees into hyperextension (genu recurvatum) with normal stance, but did note intermittent episodes of L knee "locking up" at knee cap over the weekend, particularly with stair negotiation. Pt denies need for review of initial low back or knee HEPs, therefore progressed strengthening program today emphasizing lumbopelvic strengthening with awareness of avoidance of genu recurvatum and LE strengthening with awareness of abdominal bracing. Pt noting propensity for "charlie horses" in her calves, therefore provided instruction in options for calf stretches.    Rehab Potential  Good    PT Treatment/Interventions  ADLs/Self Care Home Management;Cryotherapy;Electrical Stimulation;Ultrasound;Traction;Moist Heat;Iontophoresis 43m/ml Dexamethasone;Therapeutic activities;Therapeutic exercise;Passive range of motion;Patient/family education;Neuromuscular re-education;Manual techniques;Taping;Dry needling;Vasopneumatic Device;Gait training;Stair training    Consulted and Agree with Plan of Care  Patient       Patient will benefit from skilled therapeutic intervention in order to improve the following deficits and impairments:  Pain, Postural dysfunction, Increased muscle spasms, Decreased strength, Decreased range of motion, Decreased endurance, Decreased activity tolerance, Impaired flexibility  Visit Diagnosis: Chronic bilateral low back pain with right-sided sciatica  Radiculopathy, lumbar region  Muscle spasm of back  Muscle weakness (generalized)  Chronic pain of right knee  Chronic pain of left  knee     Problem List Patient Active Problem List   Diagnosis Date Noted  . Right foot injury, initial encounter 02/08/2018  . Bilateral knee pain 01/14/2018  . MRSA (methicillin resistant staph aureus) urine culture positive on 07/06/16 07/06/2016  . Foot injury 12/20/2015  . Thoracic back pain 12/20/2015  . Low back pain radiating to right leg 12/01/2015  . Left shoulder pain 11/07/2013  . Left flank pain 11/07/2013  . Encounter for long-term (current) use of high-risk medication 05/29/2011  . Preventative health care 05/28/2011  . Chronic pain syndrome 03/21/2011  . KNEE PAIN, LEFT 11/21/2010  . POLYARTHRITIS 11/21/2010  . BACK PAIN 11/22/2009  . JOINT EFFUSION, RIGHT KNEE 07/29/2009  . ELEVATED BP READING WITHOUT DX HYPERTENSION 07/29/2009  . 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  . PERIPHERAL EDEMA 01/06/2009  . GASTROENTERITIS, ACUTE 12/15/2008  . DYSPHAGIA UNSPECIFIED 11/23/2008  . THYROID NODULE, RIGHT 11/20/2008  . SHOULDER PAIN, BILATERAL 07/03/2008  . ANEMIA-IRON DEFICIENCY 06/26/2008  . COMMON MIGRAINE 06/26/2008  . ALLERGIC RHINITIS 06/26/2008  . GERD 06/26/2008  . ANKLE PAIN, LEFT 06/26/2008  . CERVICAL RADICULOPATHY, LEFT 06/26/2008  . LUMBAR RADICULOPATHY, LEFT 06/26/2008  . FIBROMYALGIA 06/26/2008  . TREMOR 01/02/2008  . EAR PAIN, RIGHT 12/05/2007  . FATIGUE 12/05/2007  . TACHYCARDIA 12/05/2007  . DYSPNEA 12/05/2007  . Cervicalgia 10/15/2007  . BACK PAIN, LUMBAR 10/15/2007  . Pain in Soft Tissues of Limb 10/15/2007  . Hyperlipidemia 09/13/2007  . BIPOLAR DISORDER UNSPECIFIED 09/13/2007  . Headache(784.0) 09/13/2007  . Diabetes ([place] 06/05/2007    JPercival Spanish PT, MPT 03/26/2018, 3:32 PM  [facility] [place] [address]Phone: [phone]  Fax:  [phone] Name: Shirley GERLICHMRN: [id number] of Birth: 7Sep 09, 1977

## 2018-04-02 ENCOUNTER — Ambulatory Visit: Payer: Medicaid Other

## 2018-04-02 DIAGNOSIS — M5441 Lumbago with sciatica, right side: Principal | ICD-10-CM

## 2018-04-02 DIAGNOSIS — M6281 Muscle weakness (generalized): Secondary | ICD-10-CM

## 2018-04-02 DIAGNOSIS — M5416 Radiculopathy, lumbar region: Secondary | ICD-10-CM

## 2018-04-02 DIAGNOSIS — M25561 Pain in right knee: Secondary | ICD-10-CM

## 2018-04-02 DIAGNOSIS — M25562 Pain in left knee: Secondary | ICD-10-CM

## 2018-04-02 DIAGNOSIS — M62830 Muscle spasm of back: Secondary | ICD-10-CM

## 2018-04-02 DIAGNOSIS — G8929 Other chronic pain: Secondary | ICD-10-CM

## 2018-04-02 NOTE — Patient Instructions (Signed)

## 2018-04-02 NOTE — Therapy (Signed)
[facility] [address]  [place], [place], [address] Phone: [phone]   Fax:  [phone]  Physical Therapy Treatment  Patient Details  Name: Shirley [name] MRN: [id number] Date of Birth: 09-17-1976 Referring Provider: Karlton Lemon, MD   Encounter Date: 04/02/2018  PT End of Session - 04/02/18 0943    Visit Number  5    Number of Visits  16    Date for PT Re-Evaluation  04/19/18    Authorization Type  Medicaid     Authorization Time Period  03/26/18 - 05/06/18    Authorization - Visit Number  2    Authorization - Number of Visits  12    PT Start Time  0936    PT Stop Time  1030    PT Time Calculation (min)  54 min    Activity Tolerance  Patient tolerated treatment well    Behavior During Therapy  [facility] for tasks assessed/performed       Past Medical History:  Diagnosis Date  . ADHD (attention deficit hyperactivity disorder)   . ALLERGIC RHINITIS 06/26/2008  . ANEMIA-IRON DEFICIENCY 06/26/2008  . 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  . ANKLE PAIN, LEFT 06/26/2008  . BACK PAIN, LUMBAR 10/15/2007  . BIPOLAR DISORDER UNSPECIFIED 09/13/2007  . CERVICAL RADICULOPATHY, LEFT 06/26/2008  . Cervicalgia 10/15/2007  . Chronic pain syndrome 03/21/2011  . COMMON MIGRAINE 06/26/2008  . DIABETES MELLITUS, TYPE II 06/05/2007  . DYSPNEA 12/05/2007  . EAR PAIN, RIGHT 12/05/2007  . ELEVATED BP READING WITHOUT DX HYPERTENSION 07/29/2009  . FATIGUE 12/05/2007  . FIBROMYALGIA 06/26/2008  . GASTROENTERITIS, ACUTE 12/15/2008  . GERD 06/26/2008  . Headache(784.0) 09/13/2007  . HYPERLIPIDEMIA 09/13/2007  . JOINT EFFUSION, RIGHT KNEE 07/29/2009  . KNEE PAIN, LEFT 11/21/2010  . LUMBAR RADICULOPATHY, LEFT 06/26/2008  . OTHER DISEASE OF PHARYNX OR NASOPHARYNX 07/03/2008  . PERIPHERAL EDEMA 01/06/2009  . POLYARTHRITIS 11/21/2010  . SHOULDER PAIN, BILATERAL 07/03/2008  . SINUSITIS- ACUTE-NOS 07/29/2009  . TACHYCARDIA 12/05/2007  . THYROID NODULE, RIGHT 11/20/2008  .  TREMOR 01/02/2008  . Vertigo     Past Surgical History:  Procedure Laterality Date  . back surgury     lumbar 2001  . CESAREAN SECTION     x 3  . thyroid fine needle aspiration  May 2008   showed non neoplastic goiter  . VAGINAL HYSTERECTOMY     fibroids    There were no vitals filed for this visit.  Subjective Assessment - 04/02/18 0938    Subjective  Notes 70% reduction in pain overall since starting therapy.      Patient Stated Goals  Pt. wants to become more active; Learn how to self manage the pain    Currently in Pain?  Yes    Pain Score  4     Pain Location  Shoulder    Pain Orientation  Right;Upper    Pain Descriptors / Indicators  Aching;Dull    Pain Type  Acute pain;Chronic pain    Pain Onset  More than a month ago    Pain Frequency  Intermittent    Aggravating Factors   slept on it wrong     Pain Score  0    Pain Location  Back    Pain Orientation  Left    Pain Descriptors / Indicators  -- "twinge"    Pain Type  Chronic pain    Pain Onset  More than a month ago    Aggravating Factors  prolonged standing,          [facility] PT Assessment - 04/02/18 0953      AROM   AROM Assessment Site  Knee    Right/Left Knee  Right;Left    Right Knee Extension  1 with LAQ    Right Knee Flexion  134 supine     Left Knee Extension  0 with LAQ    Left Knee Flexion  133 supine     Lumbar Flexion  hands to mid shins     Lumbar Extension  WFL     Lumbar - Right Side Bend  fingertips to joint line     Lumbar - Left Side Bend  fingertips to joint line     Lumbar - Right Rotation  [facility]     Lumbar - Left Rotation  [facility]       Strength   Right/Left Hip  Right;Left    Right Hip Flexion  4/5    Right Hip Extension  4/5    Right Hip External Rotation   4/5    Right Hip Internal Rotation  4/5    Right Hip ABduction  4/5    Right Hip ADduction  4/5    Left Hip Flexion  4/5    Left Hip Extension  4/5    Left Hip External Rotation  4/5    Left Hip Internal Rotation  4/5    Left  Hip ABduction  4/5    Left Hip ADduction  4-/5    Right/Left Knee  Right;Left    Right Knee Flexion  4+/5    Right Knee Extension  4+/5    Left Knee Flexion  4+/5    Left Knee Extension  4+/5                   [facility] Adult PT Treatment/Exercise - 04/02/18 0946      Self-Care   Self-Care  Posture;Other Self-Care Comments    Posture  Encouraged pt. to sit/stand with upright posture to avoid excessive lumbar strain     Other Self-Care Comments   Discussed proper body mechanics with daily household and work related tasks such as lifting, carrying, vacuuming, sweeping, kitchen work as to reduce lumbar strain; pt. verbalized understanding       Lumbar Exercises: Supine   Bridge with clamshell  15 reps;3 seconds with isoemtric hip abd/ER with green TB at knees       Lumbar Exercises: Sidelying   Clam  Right;Left;10 reps;3 seconds    Clam Limitations  red looped TB at knees       Knee/Hip Exercises: Aerobic   Recumbent Bike  L2 x 6'       Knee/Hip Exercises: Standing   Wall Squat  3 seconds;15 reps    Wall Squat Limitations  + hip adduction ball squeeze      Knee/Hip Exercises: Supine   Straight Leg Raises  Right;Left;10 reps;Strengthening    Straight Leg Raises Limitations  2#      Knee/Hip Exercises: Sidelying   Hip ABduction  Right;Left;10 reps;Strengthening                  PT Long Term Goals - 04/02/18 0951      PT LONG TERM GOAL #1   Title  Independent with ongoing HEP     Status  Partially Met      PT LONG TERM GOAL #2   Title  Pt. will report a 50% decrease in thoracolumbar  back pain with work activities    Status  Partially Met Reports 70% reduction in pain       PT LONG TERM GOAL #3   Title  Pt. will report/demonstrate understanding of proper posture/body mechanics to reduce strain on thoracolumbar region    Status  Partially Met Pt. reports understanding of proper body mechanics and posture however still demo's slumped forward sitting and  standing posture in treatment      PT LONG TERM GOAL #4   Title  Pt will demonstrate B knee AROM to >/= 0 dg in sitting for improved control of TKE    Status  Partially Met 1 dg short of full extension in seated with R LAQ       PT LONG TERM GOAL #5   Title  B hip and knee strength >/= 4+/5 for improved stability    Status  Partially Met met for knees however B hips still grossly 4/5      PT LONG TERM GOAL #6   Title  Pt will demonstrate static standing posture with neutral knee alignment, avoiding excessive genu recurvatum    Status  On-going      PT LONG TERM GOAL #7   Title  Pt will report ability to stand to complete normal work shift w/o limitation d/t low back or knee pain    Status  Achieved Pt. noting she is able to perform all work related tasks without low back and knee pain             Plan - 04/02/18 0945    Clinical Impression Statement  Pt. progressing well with therapy.  Has demonstrated some improvement in LE strength today partially meeting goal with MMT.  Able to demo improvement in B quad strength today with full TKE L LAQ and lacking 1 dg extension in R LAQ.  Notes she is able to perform all recent job tasks without knee or back pain.  Verbalizes understanding of proper posture and body mechanics however still demonstrating frequent slumped posture in sitting and standing in session.  Pt. will continue to benefit from further skilled therapy to maximize functional strength and mobility.      PT Treatment/Interventions  ADLs/Self Care Home Management;Cryotherapy;Electrical Stimulation;Ultrasound;Traction;Moist Heat;Iontophoresis 68m/ml Dexamethasone;Therapeutic activities;Therapeutic exercise;Passive range of motion;Patient/family education;Neuromuscular re-education;Manual techniques;Taping;Dry needling;Vasopneumatic Device;Gait training;Stair training    Consulted and Agree with Plan of Care  Patient       Patient will benefit from skilled therapeutic intervention  in order to improve the following deficits and impairments:  Pain, Postural dysfunction, Increased muscle spasms, Decreased strength, Decreased range of motion, Decreased endurance, Decreased activity tolerance, Impaired flexibility  Visit Diagnosis: Chronic bilateral low back pain with right-sided sciatica  Radiculopathy, lumbar region  Muscle spasm of back  Muscle weakness (generalized)  Chronic pain of right knee  Chronic pain of left knee     Problem List Patient Active Problem List   Diagnosis Date Noted  . Right foot injury, initial encounter 02/08/2018  . Bilateral knee pain 01/14/2018  . MRSA (methicillin resistant staph aureus) urine culture positive on 07/06/16 07/06/2016  . Foot injury 12/20/2015  . Thoracic back pain 12/20/2015  . Low back pain radiating to right leg 12/01/2015  . Left shoulder pain 11/07/2013  . Left flank pain 11/07/2013  . Encounter for long-term (current) use of high-risk medication 05/29/2011  . Preventative health care 05/28/2011  . Chronic pain syndrome 03/21/2011  . KNEE PAIN, LEFT 11/21/2010  . POLYARTHRITIS 11/21/2010  .  BACK PAIN 11/22/2009  . JOINT EFFUSION, RIGHT KNEE 07/29/2009  . ELEVATED BP READING WITHOUT DX HYPERTENSION 07/29/2009  . 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  . PERIPHERAL EDEMA 01/06/2009  . GASTROENTERITIS, ACUTE 12/15/2008  . DYSPHAGIA UNSPECIFIED 11/23/2008  . THYROID NODULE, RIGHT 11/20/2008  . SHOULDER PAIN, BILATERAL 07/03/2008  . ANEMIA-IRON DEFICIENCY 06/26/2008  . COMMON MIGRAINE 06/26/2008  . ALLERGIC RHINITIS 06/26/2008  . GERD 06/26/2008  . ANKLE PAIN, LEFT 06/26/2008  . CERVICAL RADICULOPATHY, LEFT 06/26/2008  . LUMBAR RADICULOPATHY, LEFT 06/26/2008  . FIBROMYALGIA 06/26/2008  . TREMOR 01/02/2008  . EAR PAIN, RIGHT 12/05/2007  . FATIGUE 12/05/2007  . TACHYCARDIA 12/05/2007  . DYSPNEA 12/05/2007  . Cervicalgia 10/15/2007  . BACK PAIN, LUMBAR 10/15/2007  . Pain in Soft Tissues of Limb 10/15/2007  .  Hyperlipidemia 09/13/2007  . BIPOLAR DISORDER UNSPECIFIED 09/13/2007  . Headache(784.0) 09/13/2007  . Diabetes ([place]) 06/05/2007    Bess Harvest, PTA 04/02/18 12:12 PM  [facility] [address]  [place], [place], [address] Phone: [phone]   Fax:  [phone]  Name: Shirley [name] MRN: [id number] Date of Birth: [birth year]

## 2018-04-04 ENCOUNTER — Ambulatory Visit: Payer: Medicaid Other | Admitting: Family Medicine

## 2018-04-05 ENCOUNTER — Ambulatory Visit: Payer: Medicaid Other | Admitting: Physical Therapy

## 2018-04-05 DIAGNOSIS — M5441 Lumbago with sciatica, right side: Secondary | ICD-10-CM | POA: Diagnosis not present

## 2018-04-05 DIAGNOSIS — M6281 Muscle weakness (generalized): Secondary | ICD-10-CM

## 2018-04-05 DIAGNOSIS — M5416 Radiculopathy, lumbar region: Secondary | ICD-10-CM

## 2018-04-05 DIAGNOSIS — M62830 Muscle spasm of back: Secondary | ICD-10-CM

## 2018-04-05 DIAGNOSIS — G8929 Other chronic pain: Secondary | ICD-10-CM

## 2018-04-05 DIAGNOSIS — M25562 Pain in left knee: Secondary | ICD-10-CM

## 2018-04-05 DIAGNOSIS — M25561 Pain in right knee: Secondary | ICD-10-CM

## 2018-04-05 NOTE — Therapy (Addendum)
[facility] [address]  [place], [place], [address] Phone: [phone]   Fax:  [phone]  Physical Therapy Treatment  Patient Details  Name: Shirley [name] MRN: [id number] Date of Birth: Jul 26, 1976 Referring Provider: Karlton Lemon, MD   Encounter Date: 04/05/2018  PT End of Session - 04/05/18 1027    Visit Number  6    Number of Visits  17    Date for PT Re-Evaluation  04/19/18    Authorization Type  Medicaid     Authorization Time Period  03/26/18 - 05/06/18    Authorization - Visit Number  3    Authorization - Number of Visits  13    PT Start Time  0931    PT Stop Time  1013    PT Time Calculation (min)  42 min    Activity Tolerance  Patient tolerated treatment well;Patient limited by fatigue;Patient limited by pain    Behavior During Therapy  [facility] for tasks assessed/performed       Past Medical History:  Diagnosis Date  . ADHD (attention deficit hyperactivity disorder)   . ALLERGIC RHINITIS 06/26/2008  . ANEMIA-IRON DEFICIENCY 06/26/2008  . 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  . ANKLE PAIN, LEFT 06/26/2008  . BACK PAIN, LUMBAR 10/15/2007  . BIPOLAR DISORDER UNSPECIFIED 09/13/2007  . CERVICAL RADICULOPATHY, LEFT 06/26/2008  . Cervicalgia 10/15/2007  . Chronic pain syndrome 03/21/2011  . COMMON MIGRAINE 06/26/2008  . DIABETES MELLITUS, TYPE II 06/05/2007  . DYSPNEA 12/05/2007  . EAR PAIN, RIGHT 12/05/2007  . ELEVATED BP READING WITHOUT DX HYPERTENSION 07/29/2009  . FATIGUE 12/05/2007  . FIBROMYALGIA 06/26/2008  . GASTROENTERITIS, ACUTE 12/15/2008  . GERD 06/26/2008  . Headache(784.0) 09/13/2007  . HYPERLIPIDEMIA 09/13/2007  . JOINT EFFUSION, RIGHT KNEE 07/29/2009  . KNEE PAIN, LEFT 11/21/2010  . LUMBAR RADICULOPATHY, LEFT 06/26/2008  . OTHER DISEASE OF PHARYNX OR NASOPHARYNX 07/03/2008  . PERIPHERAL EDEMA 01/06/2009  . POLYARTHRITIS 11/21/2010  . SHOULDER PAIN, BILATERAL 07/03/2008  . SINUSITIS- ACUTE-NOS 07/29/2009  . TACHYCARDIA  12/05/2007  . THYROID NODULE, RIGHT 11/20/2008  . TREMOR 01/02/2008  . Vertigo     Past Surgical History:  Procedure Laterality Date  . back surgury     lumbar 2001  . CESAREAN SECTION     x 3  . thyroid fine needle aspiration  May 2008   showed non neoplastic goiter  . VAGINAL HYSTERECTOMY     fibroids    There were no vitals filed for this visit.  Subjective Assessment - 04/05/18 0931    Subjective  Notes that her condition has improved since the last visit and she has experienced less "twinges". States that nothing really makes her pain better or worse and that it is a constant feeling.  Pt potentially starting a new job soon.    Limitations  Sitting    Currently in Pain?  No/denies                       [facility] Adult PT Treatment/Exercise - 04/05/18 0938      Lumbar Exercises: Aerobic   Nustep  L4 x 6 min      Lumbar Exercises: Seated   Other Seated Lumbar Exercises  Repeated extension in sitting; 10 reps with hands on hips    Other Seated Lumbar Exercises  Sciatic nerve glides; 10 reps with 5 second hold each direction      Lumbar Exercises: Supine   Bridge with clamshell  10 reps;3 seconds Green T band with tactile cue for eccentric control    Straight Leg Raise  10 reps;1 second 3#      Knee/Hip Exercises: Stretches   Piriformis Stretch  2 reps;30 seconds;Right;Left    Piriformis Stretch Limitations  Supine      Knee/Hip Exercises: Standing   Hip Extension  Stengthening;Both;10 reps RDLs lifting cane from 8" step. VC for glute activation    Other Standing Knee Exercises  Monster walks 30 ft; yellow TB    Other Standing Knee Exercises  Backwards monster walk 30 ft; yellow TB      Knee/Hip Exercises: Supine   Straight Leg Raises  Right;Left;10 reps    Straight Leg Raises Limitations  3#                  PT Long Term Goals - 04/02/18 0951      PT LONG TERM GOAL #1   Title  Independent with ongoing HEP     Status  Partially Met       PT LONG TERM GOAL #2   Title  Pt. will report a 50% decrease in thoracolumbar back pain with work activities    Status  Partially Met Reports 70% reduction in pain       PT LONG TERM GOAL #3   Title  Pt. will report/demonstrate understanding of proper posture/body mechanics to reduce strain on thoracolumbar region    Status  Partially Met Pt. reports understanding of proper body mechanics and posture however still demo's slumped forward sitting and standing posture in treatment      PT LONG TERM GOAL #4   Title  Pt will demonstrate B knee AROM to >/= 0 dg in sitting for improved control of TKE    Status  Partially Met 1 dg short of full extension in seated with R LAQ       PT LONG TERM GOAL #5   Title  B hip and knee strength >/= 4+/5 for improved stability    Status  Partially Met met for knees however B hips still grossly 4/5      PT LONG TERM GOAL #6   Title  Pt will demonstrate static standing posture with neutral knee alignment, avoiding excessive genu recurvatum    Status  On-going      PT LONG TERM GOAL #7   Title  Pt will report ability to stand to complete normal work shift w/o limitation d/t low back or knee pain    Status  Achieved Pt. noting she is able to perform all work related tasks without low back and knee pain             Plan - 04/05/18 1030    Clinical Impression Statement  Pt progressing well with therapy and reports decreased pain throughout daily activities. Several activities during treatment session had to be stopped intermittently secondary to knee discomfort that pt attemped to relieve with "popping". Pt demonstrates difficulty with activities that challenge LE stability and strength, as well as poor posture, and will continue to benefit from physical therapy to address this impairments and progress towards functional goals.     Rehab Potential  Good    PT Treatment/Interventions  ADLs/Self Care Home Management;Cryotherapy;Electrical  Stimulation;Ultrasound;Traction;Moist Heat;Iontophoresis 99m/ml Dexamethasone;Therapeutic activities;Therapeutic exercise;Passive range of motion;Patient/family education;Neuromuscular re-education;Manual techniques;Taping;Dry needling;Vasopneumatic Device;Gait training;Stair training    Consulted and Agree with Plan of Care  Patient       Patient will benefit from skilled therapeutic  intervention in order to improve the following deficits and impairments:  Pain, Postural dysfunction, Increased muscle spasms, Decreased strength, Decreased range of motion, Decreased endurance, Decreased activity tolerance, Impaired flexibility  Visit Diagnosis: Chronic bilateral low back pain with right-sided sciatica  Radiculopathy, lumbar region  Muscle spasm of back  Muscle weakness (generalized)  Chronic pain of right knee  Chronic pain of left knee     Problem List Patient Active Problem List   Diagnosis Date Noted  . Right foot injury, initial encounter 02/08/2018  . Bilateral knee pain 01/14/2018  . MRSA (methicillin resistant staph aureus) urine culture positive on 07/06/16 07/06/2016  . Foot injury 12/20/2015  . Thoracic back pain 12/20/2015  . Low back pain radiating to right leg 12/01/2015  . Left shoulder pain 11/07/2013  . Left flank pain 11/07/2013  . Encounter for long-term (current) use of high-risk medication 05/29/2011  . Preventative health care 05/28/2011  . Chronic pain syndrome 03/21/2011  . KNEE PAIN, LEFT 11/21/2010  . POLYARTHRITIS 11/21/2010  . BACK PAIN 11/22/2009  . JOINT EFFUSION, RIGHT KNEE 07/29/2009  . ELEVATED BP READING WITHOUT DX HYPERTENSION 07/29/2009  . 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  . PERIPHERAL EDEMA 01/06/2009  . GASTROENTERITIS, ACUTE 12/15/2008  . DYSPHAGIA UNSPECIFIED 11/23/2008  . THYROID NODULE, RIGHT 11/20/2008  . SHOULDER PAIN, BILATERAL 07/03/2008  . ANEMIA-IRON DEFICIENCY 06/26/2008  . COMMON MIGRAINE 06/26/2008  . ALLERGIC RHINITIS 06/26/2008   . GERD 06/26/2008  . ANKLE PAIN, LEFT 06/26/2008  . CERVICAL RADICULOPATHY, LEFT 06/26/2008  . LUMBAR RADICULOPATHY, LEFT 06/26/2008  . FIBROMYALGIA 06/26/2008  . TREMOR 01/02/2008  . EAR PAIN, RIGHT 12/05/2007  . FATIGUE 12/05/2007  . TACHYCARDIA 12/05/2007  . DYSPNEA 12/05/2007  . Cervicalgia 10/15/2007  . BACK PAIN, LUMBAR 10/15/2007  . Pain in Soft Tissues of Limb 10/15/2007  . Hyperlipidemia 09/13/2007  . BIPOLAR DISORDER UNSPECIFIED 09/13/2007  . Headache(784.0) 09/13/2007  . Diabetes ([place]) 06/05/2007    Shirline Frees, SPT 04/05/2018, 12:32 PM  [facility] [address]  [place], [place], [address] Phone: [phone]   Fax:  [phone]  Name: AYSHA LIVECCHI MRN: [id number] Date of Birth: January 04, 1976   PHYSICAL THERAPY DISCHARGE SUMMARY  Visits from Start of Care: 6  Current functional level related to goals / functional outcomes: Unable to formally assess due to pt unable to return due a change in work schedule. At time of last visit pt has met some therapeutic goals but was continuing to experience some discomfort, pain, and fatigue with therapeutic activities.   Remaining deficits: See clinical impression.    Education / Equipment: Pt provided with HEP throughout plan of care and education on body mechanics while performing work activities   Plan: Patient agrees to discharge.  Patient goals were partially met. Patient is being discharged due to not returning since the last visit.  ?????         Shirline Frees, SPT 05/07/2018  3:14 PM

## 2018-04-08 ENCOUNTER — Ambulatory Visit: Payer: Medicaid Other | Admitting: Family Medicine

## 2018-04-09 ENCOUNTER — Ambulatory Visit: Payer: Medicaid Other

## 2018-04-19 ENCOUNTER — Encounter: Payer: Medicaid Other | Admitting: Physical Therapy

## 2018-05-10 ENCOUNTER — Other Ambulatory Visit: Payer: Self-pay

## 2018-05-10 ENCOUNTER — Emergency Department (HOSPITAL_BASED_OUTPATIENT_CLINIC_OR_DEPARTMENT_OTHER)
Admission: EM | Admit: 2018-05-10 | Discharge: 2018-05-10 | Disposition: A | Discharge status: Home / Self Care | Payer: Medicaid Other | Attending: Emergency Medicine | Admitting: Emergency Medicine

## 2018-05-10 ENCOUNTER — Encounter (HOSPITAL_BASED_OUTPATIENT_CLINIC_OR_DEPARTMENT_OTHER): Payer: Self-pay

## 2018-05-10 DIAGNOSIS — Z7984 Long term (current) use of oral hypoglycemic drugs: Secondary | ICD-10-CM | POA: Diagnosis not present

## 2018-05-10 DIAGNOSIS — Z79899 Other long term (current) drug therapy: Secondary | ICD-10-CM | POA: Insufficient documentation

## 2018-05-10 DIAGNOSIS — M62838 Other muscle spasm: Secondary | ICD-10-CM | POA: Diagnosis not present

## 2018-05-10 DIAGNOSIS — M62830 Muscle spasm of back: Secondary | ICD-10-CM | POA: Diagnosis not present

## 2018-05-10 DIAGNOSIS — E119 Type 2 diabetes mellitus without complications: Secondary | ICD-10-CM | POA: Insufficient documentation

## 2018-05-10 DIAGNOSIS — M79602 Pain in left arm: Secondary | ICD-10-CM | POA: Diagnosis not present

## 2018-05-10 DIAGNOSIS — M25512 Pain in left shoulder: Secondary | ICD-10-CM | POA: Diagnosis not present

## 2018-05-10 MED ORDER — ACETAMINOPHEN 500 MG PO TABS
1000.0000 mg | ORAL_TABLET | Freq: Once | ORAL | Status: AC
  Administered 2018-05-10: 1000 mg via ORAL
  Filled 2018-05-10: qty 2

## 2018-05-10 MED ORDER — KETOROLAC TROMETHAMINE 15 MG/ML IJ SOLN
15.0000 mg | Freq: Once | INTRAMUSCULAR | Status: AC
  Administered 2018-05-10: 15 mg via INTRAMUSCULAR
  Filled 2018-05-10: qty 1

## 2018-05-10 NOTE — ED Triage Notes (Addendum)
C/o pain to left upper arm x 2-3 weeks after starting a new job-pain to left side of side of neck started yesterday-all pain worse with movement/when she lifts arm above head-NAD-steady gait

## 2018-05-10 NOTE — ED Provider Notes (Signed)
[facility] EMERGENCY DEPARTMENT Provider Note   CSN: [id number] Arrival date & time: 05/10/18  1352     History   Chief Complaint Chief Complaint  Patient presents with  . Arm Pain    HPI Shirley [name] is a 42 y.o. female.  42 yo F with a chief complaint of left shoulder pain.  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  She cut it with her left arm, that is her dominant side.  She states this frozen is very hard to cut through.  Sometimes she has cut through 2 at a time.  Having pain worsening there for the past week.  She has been working there for the past month.  She denies fevers or chills denies trauma.  Denies shortness of breath.  Denies exertional symptoms.  The history is provided by the patient.  Arm Pain  This is a new problem. The current episode started more than 1 week ago. The problem occurs constantly. The problem has been gradually worsening. Pertinent negatives include no chest pain, no headaches and no shortness of breath. The symptoms are aggravated by bending and twisting. Nothing relieves the symptoms. She has tried nothing for the symptoms. The treatment provided no relief.    Past Medical History:  Diagnosis Date  . ADHD (attention deficit hyperactivity disorder)   . ALLERGIC RHINITIS 06/26/2008  . ANEMIA-IRON DEFICIENCY 06/26/2008  . 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  . ANKLE PAIN, LEFT 06/26/2008  . BACK PAIN, LUMBAR 10/15/2007  . BIPOLAR DISORDER UNSPECIFIED 09/13/2007  . CERVICAL RADICULOPATHY, LEFT 06/26/2008  . Cervicalgia 10/15/2007  . Chronic pain syndrome 03/21/2011  . COMMON MIGRAINE 06/26/2008  . DIABETES MELLITUS, TYPE II 06/05/2007  . DYSPNEA 12/05/2007  . EAR PAIN, RIGHT 12/05/2007  . ELEVATED BP READING WITHOUT DX HYPERTENSION 07/29/2009  . FATIGUE 12/05/2007  . FIBROMYALGIA 06/26/2008  . GASTROENTERITIS, ACUTE 12/15/2008  . GERD 06/26/2008  . Headache(784.0) 09/13/2007  . HYPERLIPIDEMIA 09/13/2007  . JOINT EFFUSION,  RIGHT KNEE 07/29/2009  . KNEE PAIN, LEFT 11/21/2010  . LUMBAR RADICULOPATHY, LEFT 06/26/2008  . OTHER DISEASE OF PHARYNX OR NASOPHARYNX 07/03/2008  . PERIPHERAL EDEMA 01/06/2009  . POLYARTHRITIS 11/21/2010  . SHOULDER PAIN, BILATERAL 07/03/2008  . SINUSITIS- ACUTE-NOS 07/29/2009  . TACHYCARDIA 12/05/2007  . THYROID NODULE, RIGHT 11/20/2008  . TREMOR 01/02/2008  . Vertigo     Patient Active Problem List   Diagnosis Date Noted  . Right foot injury, initial encounter 02/08/2018  . Bilateral knee pain 01/14/2018  . MRSA (methicillin resistant staph aureus) urine culture positive on 07/06/16 07/06/2016  . Foot injury 12/20/2015  . Thoracic back pain 12/20/2015  . Low back pain radiating to right leg 12/01/2015  . Left shoulder pain 11/07/2013  . Left flank pain 11/07/2013  . Encounter for long-term (current) use of high-risk medication 05/29/2011  . Preventative health care 05/28/2011  . Chronic pain syndrome 03/21/2011  . KNEE PAIN, LEFT 11/21/2010  . POLYARTHRITIS 11/21/2010  . BACK PAIN 11/22/2009  . JOINT EFFUSION, RIGHT KNEE 07/29/2009  . ELEVATED BP READING WITHOUT DX HYPERTENSION 07/29/2009  . 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  . PERIPHERAL EDEMA 01/06/2009  . GASTROENTERITIS, ACUTE 12/15/2008  . DYSPHAGIA UNSPECIFIED 11/23/2008  . THYROID NODULE, RIGHT 11/20/2008  . SHOULDER PAIN, BILATERAL 07/03/2008  . ANEMIA-IRON DEFICIENCY 06/26/2008  . COMMON MIGRAINE 06/26/2008  . ALLERGIC RHINITIS 06/26/2008  . GERD 06/26/2008  . ANKLE PAIN, LEFT 06/26/2008  . CERVICAL RADICULOPATHY, LEFT 06/26/2008  . LUMBAR RADICULOPATHY, LEFT  06/26/2008  . FIBROMYALGIA 06/26/2008  . TREMOR 01/02/2008  . EAR PAIN, RIGHT 12/05/2007  . FATIGUE 12/05/2007  . TACHYCARDIA 12/05/2007  . DYSPNEA 12/05/2007  . Cervicalgia 10/15/2007  . BACK PAIN, LUMBAR 10/15/2007  . Pain in Soft Tissues of Limb 10/15/2007  . Hyperlipidemia 09/13/2007  . BIPOLAR DISORDER UNSPECIFIED 09/13/2007  . Headache(784.0) 09/13/2007  .  Diabetes ([place]) 06/05/2007    Past Surgical History:  Procedure Laterality Date  . back surgury     lumbar 2001  . CESAREAN SECTION     x 3  . thyroid fine needle aspiration  May 2008   showed non neoplastic goiter  . VAGINAL HYSTERECTOMY     fibroids     OB History    Gravida  3   Para  3   Term  2   Preterm  1   AB      Living  3     SAB      TAB      Ectopic      Multiple      Live Births  3            Home Medications    Prior to Admission medications   Medication Sig Start Date End Date Taking? Authorizing Provider  calcium-vitamin D (OSCAL WITH D) 500-200 MG-UNIT tablet Take 1 tablet by mouth.    [provider]  cetirizine (ZYRTEC) 10 MG tablet Take 1 tablet (10 mg total) by mouth daily. 03/08/18   Ward, Ozella Almond, PA-C  diclofenac (VOLTAREN) 75 MG EC tablet Take 1 tablet (75 mg total) by mouth 2 (two) times daily. 03/11/18   Vanessa Kick, MD  LO LOESTRIN FE 1 MG-10 MCG / 10 MCG tablet Take 1 tablet by mouth daily. 02/20/18   Truett Mainland, DO  metFORMIN (GLUCOPHAGE) 500 MG tablet Take 1 tablet (500 mg total) by mouth 2 (two) times daily with a meal. 01/11/18   Truett Mainland, DO  Multiple Vitamins-Minerals (MULTIVITAMIN WITH MINERALS) tablet Take 1 tablet by mouth daily.    [provider]  predniSONE (DELTASONE) 10 MG tablet 6 tabs po days 1-2, 5 tabs po days 3-4, 4 tabs po days 5-6, 3 tabs po days 7-8, 2 tabs po days 9-10, 1 tab po days 11-12 01/30/18   Hudnall, Sharyn Lull, MD  Probiotic Product (PROBIOTIC PO) Take 1 tablet by mouth as needed.     [provider]  tiZANidine (ZANAFLEX) 4 MG tablet Take 1 tablet (4 mg total) by mouth every 6 (six) hours as needed for muscle spasms. 01/22/18   Dene Gentry, MD    Family History Family History  Problem Relation Age of Onset  . Thyroid disease Mother   . Diabetes Mother   . Asthma Mother   . Hypertension Father   . Hyperlipidemia Father   . Diabetes Father   .  Emphysema Other   . Coronary artery disease Other   . Cancer Other        pancreatic and breast cancer  . Cancer Other        lung and prostate cancer  . Breast cancer Neg Hx     Social History Social History   Tobacco Use  . Smoking status: Never Smoker  . Smokeless tobacco: Never Used  Substance Use Topics  . Alcohol use: No  . Drug use: No     Allergies   Sulfa antibiotics; Contrast media [iodinated diagnostic agents]; and Promethazine hcl  Review of Systems Review of Systems  Constitutional: Negative for chills and fever.  HENT: Negative for congestion and rhinorrhea.   Eyes: Negative for redness and visual disturbance.  Respiratory: Negative for shortness of breath and wheezing.   Cardiovascular: Negative for chest pain and palpitations.  Gastrointestinal: Negative for nausea and vomiting.  Genitourinary: Negative for dysuria and urgency.  Musculoskeletal: Positive for arthralgias. Negative for myalgias.  Skin: Negative for pallor and wound.  Neurological: Negative for dizziness and headaches.     Physical Exam Updated Vital Signs BP (!) 121/51 (BP Location: Right Arm)   Pulse (!) 113   Temp 98.1 F (36.7 C) (Oral)   Resp 20   Ht 5\' 4"  (1.626 m)   Wt 87.5 kg (193 lb)   SpO2 99%   BMI 33.13 kg/m   Physical Exam  Constitutional: She is oriented to person, place, and time. She appears well-developed and well-nourished. No distress.  HENT:  Head: Normocephalic and atraumatic.  Eyes: Pupils are equal, round, and reactive to light. EOM are normal.  Neck: Normal range of motion. Neck supple.  Cardiovascular: Normal rate and regular rhythm. Exam reveals no gallop and no friction rub.  No murmur heard. Pulmonary/Chest: Effort normal. She has no wheezes. She has no rales.  Abdominal: Soft. She exhibits no distension. There is no tenderness.  Musculoskeletal: She exhibits tenderness. She exhibits no edema.  Exquisitely tender to the left trapezius.  She has  some mild tenderness extends to the left anterior chest as well as the left anterior upper arm.  Pulse motor and sensation is intact in the left upper extremity.  No midline tenderness.  Able to rotate the head 45 degrees in either direction.  Neurological: She is alert and oriented to person, place, and time.  Skin: Skin is warm and dry. She is not diaphoretic.  Psychiatric: She has a normal mood and affect. Her behavior is normal.  Nursing note and vitals reviewed.    ED Treatments / Results  Labs (all labs ordered are listed, but only abnormal results are displayed) Labs Reviewed - No data to display  EKG None  Radiology No results found.  Procedures Procedures (including critical care time)  Medications Ordered in ED Medications  acetaminophen (TYLENOL) tablet 1,000 mg (1,000 mg Oral Given 05/10/18 1539)  ketorolac (TORADOL) 15 MG/ML injection 15 mg (15 mg Intramuscular Given 05/10/18 1541)     Initial Impression / Assessment and Plan / ED Course  I have reviewed the triage vital signs and the nursing notes.  Pertinent labs & imaging results that were available during my care of the patient were reviewed by me and considered in my medical decision making (see chart for details).     41 yo F with a chief complaint of left shoulder pain.  This is been going on for the past week.  The patient recently started a new job.  The patient denies trauma.  My exam is greatly tender to the left trapezius.  The rest of the shoulder exam is difficult due to the amount of pain that she is having.  Will place in a sling.  She has appointment scheduled with a sports medicine specialist on Tuesday.  3:47 PM:  I have discussed the diagnosis/risks/treatment options with the patient and believe the pt to be eligible for discharge home to follow-up with PCP, sports med. We also discussed returning to the ED immediately if new or worsening sx occur. We discussed the sx which are most concerning  (  e.g., sudden worsening pain, fever, inability to tolerate by mouth) that necessitate immediate return. Medications administered to the patient during their visit and any new prescriptions provided to the patient are listed below.  Medications given during this visit Medications  acetaminophen (TYLENOL) tablet 1,000 mg (1,000 mg Oral Given 05/10/18 1539)  ketorolac (TORADOL) 15 MG/ML injection 15 mg (15 mg Intramuscular Given 05/10/18 1541)      The patient appears reasonably screen and/or stabilized for discharge and I doubt any other medical condition or other [facility] requiring further screening, evaluation, or treatment in the ED at this time prior to discharge.    Final Clinical Impressions(s) / ED Diagnoses   Final diagnoses:  Trapezius muscle spasm    ED Discharge Orders    None       Deno Etienne, DO 05/10/18 1549

## 2018-05-10 NOTE — Discharge Instructions (Signed)
Take 4 over the counter ibuprofen tablets 3 times a day or 2 over-the-counter naproxen tablets twice a day for pain. Also take tylenol 1000mg (2 extra strength) four times a day.    Take your arm out of the sling and move it through a range of motion for about 5 minutes at least 4 times a day.

## 2018-05-14 ENCOUNTER — Encounter: Payer: Self-pay | Admitting: Family Medicine

## 2018-05-14 ENCOUNTER — Ambulatory Visit: Payer: Medicaid Other | Admitting: Family Medicine

## 2018-05-14 DIAGNOSIS — M542 Cervicalgia: Secondary | ICD-10-CM | POA: Diagnosis not present

## 2018-05-14 MED ORDER — HYDROCODONE-ACETAMINOPHEN 5-325 MG PO TABS: 1.0000 | ORAL_TABLET | Freq: Four times a day (QID) | ORAL | 0 refills | Status: DC | PRN

## 2018-05-14 MED ORDER — DICLOFENAC SODIUM 75 MG PO TBEC: 75.0000 mg | DELAYED_RELEASE_TABLET | Freq: Two times a day (BID) | ORAL | 1 refills | Status: DC

## 2018-05-14 NOTE — Patient Instructions (Signed)
You have overuse muscle strains of the erector spinae of your back, trapezius, pectoralis, biceps, and triceps muscles. Take diclofenac twice a day with food. Tizanidine as needed for spasms. Norco as needed for severe pain (no driving, working on this medicine). Ice or heat if needed, whichever feels better. Consider back brace, physical therapy. Do home exercises for your low back. Follow up with me in 4 weeks but let me know sooner if you want to do therapy.

## 2018-05-15 ENCOUNTER — Encounter: Payer: Self-pay | Admitting: Family Medicine

## 2018-05-15 NOTE — Assessment & Plan Note (Signed)
no red flags.  Consistent with overuse strains of erector spinae of back, trapezius, pectoralis, biceps, and triceps muscles at her job.  Diclofenac with tizanidine, norco as needed.  Ice or heat if needed.  Consider physical therapy, back brace.  Shown home exercises for her back.  F/u in 4 weeks.

## 2018-05-15 NOTE — Progress Notes (Signed)
PCP: Biagio Borg, MD  Subjective:   HPI: Patient is a 42 y.o. female here for neck, left shoulder pain.  Patient reports she recently started a new job over past month. 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 No acute injury but developed pain initially in the left bicep then into chest then back area. Worse at night and at work. Pain up to 7/10 level and sharp. No numbness, skin changes.  Past Medical History:  Diagnosis Date  . ADHD (attention deficit hyperactivity disorder)   . ALLERGIC RHINITIS 06/26/2008  . ANEMIA-IRON DEFICIENCY 06/26/2008  . 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  . ANKLE PAIN, LEFT 06/26/2008  . BACK PAIN, LUMBAR 10/15/2007  . BIPOLAR DISORDER UNSPECIFIED 09/13/2007  . CERVICAL RADICULOPATHY, LEFT 06/26/2008  . Cervicalgia 10/15/2007  . Chronic pain syndrome 03/21/2011  . COMMON MIGRAINE 06/26/2008  . DIABETES MELLITUS, TYPE II 06/05/2007  . DYSPNEA 12/05/2007  . EAR PAIN, RIGHT 12/05/2007  . ELEVATED BP READING WITHOUT DX HYPERTENSION 07/29/2009  . FATIGUE 12/05/2007  . FIBROMYALGIA 06/26/2008  . GASTROENTERITIS, ACUTE 12/15/2008  . GERD 06/26/2008  . Headache(784.0) 09/13/2007  . HYPERLIPIDEMIA 09/13/2007  . JOINT EFFUSION, RIGHT KNEE 07/29/2009  . KNEE PAIN, LEFT 11/21/2010  . LUMBAR RADICULOPATHY, LEFT 06/26/2008  . OTHER DISEASE OF PHARYNX OR NASOPHARYNX 07/03/2008  . PERIPHERAL EDEMA 01/06/2009  . POLYARTHRITIS 11/21/2010  . SHOULDER PAIN, BILATERAL 07/03/2008  . SINUSITIS- ACUTE-NOS 07/29/2009  . TACHYCARDIA 12/05/2007  . THYROID NODULE, RIGHT 11/20/2008  . TREMOR 01/02/2008  . Vertigo     Current Outpatient Medications on File Prior to Visit  Medication Sig Dispense Refill  . calcium-vitamin D (OSCAL WITH D) 500-200 MG-UNIT tablet Take 1 tablet by mouth.    . cetirizine (ZYRTEC) 10 MG tablet Take 1 tablet (10 mg total) by mouth daily. 30 tablet 0  . LO LOESTRIN FE 1 MG-10 MCG / 10 MCG tablet Take 1 tablet by mouth daily. 3 Package 3  . metFORMIN  (GLUCOPHAGE) 500 MG tablet Take 1 tablet (500 mg total) by mouth 2 (two) times daily with a meal. 60 tablet 5  . Multiple Vitamins-Minerals (MULTIVITAMIN WITH MINERALS) tablet Take 1 tablet by mouth daily.    . predniSONE (DELTASONE) 10 MG tablet 6 tabs po days 1-2, 5 tabs po days 3-4, 4 tabs po days 5-6, 3 tabs po days 7-8, 2 tabs po days 9-10, 1 tab po days 11-12 42 tablet 0  . Probiotic Product (PROBIOTIC PO) Take 1 tablet by mouth as needed.     Marland Kitchen tiZANidine (ZANAFLEX) 4 MG tablet Take 1 tablet (4 mg total) by mouth every 6 (six) hours as needed for muscle spasms. 60 tablet 1   No current facility-administered medications on file prior to visit.     Past Surgical History:  Procedure Laterality Date  . back surgury     lumbar 2001  . CESAREAN SECTION     x 3  . thyroid fine needle aspiration  May 2008   showed non neoplastic goiter  . VAGINAL HYSTERECTOMY     fibroids    Allergies  Allergen Reactions  . Sulfa Antibiotics Anaphylaxis  . Contrast Media [Iodinated Diagnostic Agents] Itching    Mri, severe heaving ~ can take benadryl without reaction  . Promethazine Hcl     Iv dose with benadryl causes severe aggrivation    Social History   Socioeconomic History  . Marital status: Married    Spouse name: Not on file  . Number of  children: 3  . Years of education: Not on file  . Highest education level: Not on file  Occupational History    Employer: UNEMPLOYED  Social Needs  . Financial resource strain: Not on file  . Food insecurity:    Worry: Not on file    Inability: Not on file  . Transportation needs:    Medical: Not on file    Non-medical: Not on file  Tobacco Use  . Smoking status: Never Smoker  . Smokeless tobacco: Never Used  Substance and Sexual Activity  . Alcohol use: No  . Drug use: No  . Sexual activity: Not on file  Lifestyle  . Physical activity:    Days per week: Not on file    Minutes per session: Not on file  . Stress: Not on file   Relationships  . Social connections:    Talks on phone: Not on file    Gets together: Not on file    Attends religious service: Not on file    Active member of club or organization: Not on file    Attends meetings of clubs or organizations: Not on file    Relationship status: Not on file  . Intimate partner violence:    Fear of current or ex partner: Not on file    Emotionally abused: Not on file    Physically abused: Not on file    Forced sexual activity: Not on file  Other Topics Concern  . Not on file  Social History Narrative  . Not on file    Family History  Problem Relation Age of Onset  . Thyroid disease Mother   . Diabetes Mother   . Asthma Mother   . Hypertension Father   . Hyperlipidemia Father   . Diabetes Father   . Emphysema Other   . Coronary artery disease Other   . Cancer Other        pancreatic and breast cancer  . Cancer Other        lung and prostate cancer  . Breast cancer Neg Hx     BP 111/84   Pulse 100   Ht 5\' 4"  (1.626 m)   Wt 193 lb (87.5 kg)   BMI 33.13 kg/m   Review of Systems: See HPI above.     Objective:  Physical Exam:  Gen: NAD, comfortable in exam room  Neck: No gross deformity, swelling, bruising. TTP biceps, triceps, pectoralis, trapezius, down to mid-low back paraspinally.  No midline/bony TTP. FROM with pain on left lateral rotation and flexion. BUE strength 5/5.   Sensation intact to light touch.   NV intact distal BUEs.  Left shoulder: No swelling, ecchymoses.  No gross deformity. TTP biceps, triceps, pectoralis, trapezius. FROM with pain on overhead motion. Negative Hawkins, Neers. Negative Yergasons. Strength 5/5 with empty can and resisted internal/external rotation. Negative apprehension. NV intact distally.   Assessment & Plan:  1. Left shoulder, back, neck pain - no red flags.  Consistent with overuse strains of erector spinae of back, trapezius, pectoralis, biceps, and triceps muscles at her job.   Diclofenac with tizanidine, norco as needed.  Ice or heat if needed.  Consider physical therapy, back brace.  Shown home exercises for her back.  F/u in 4 weeks.

## 2018-05-17 ENCOUNTER — Ambulatory Visit (HOSPITAL_COMMUNITY)
Admission: EM | Admit: 2018-05-17 | Discharge: 2018-05-17 | Disposition: A | Discharge status: Home / Self Care | Payer: Medicaid Other | Attending: Family Medicine | Admitting: Family Medicine

## 2018-05-17 ENCOUNTER — Encounter (HOSPITAL_COMMUNITY): Payer: Self-pay | Admitting: Emergency Medicine

## 2018-05-17 ENCOUNTER — Other Ambulatory Visit: Payer: Self-pay

## 2018-05-17 DIAGNOSIS — R1011 Right upper quadrant pain: Secondary | ICD-10-CM

## 2018-05-17 MED ORDER — RANITIDINE HCL 150 MG PO CAPS: 150.0000 mg | ORAL_CAPSULE | Freq: Every day | ORAL | 1 refills | Status: DC

## 2018-05-17 MED ORDER — OMEPRAZOLE 20 MG PO CPDR
20.0000 mg | DELAYED_RELEASE_CAPSULE | Freq: Every day | ORAL | 1 refills | Status: DC
Start: 2018-05-17 — End: 2018-06-13

## 2018-05-17 MED ORDER — GI COCKTAIL ~~LOC~~
ORAL | Status: AC
  Filled 2018-05-17: qty 30

## 2018-05-17 MED ORDER — SUCRALFATE 1 G PO TABS: 1.0000 g | ORAL_TABLET | Freq: Three times a day (TID) | ORAL | 0 refills | Status: DC

## 2018-05-17 MED ORDER — GI COCKTAIL ~~LOC~~
30.0000 mL | Freq: Once | ORAL | Status: AC
  Administered 2018-05-17: 30 mL via ORAL

## 2018-05-17 NOTE — ED Provider Notes (Signed)
[facility]    CSN: [id number] Arrival date & time: 05/17/18  3235     History   Chief Complaint Chief Complaint  Patient presents with  . Abdominal Pain    HPI LETINA LUCKETT is a 42 y.o. female.   Patient is a 42 year old female with significant past medical history.  She does have a history of GERD.  She presents with 3 days of right upper quadrant abdominal pain.  The pain is described as dull, aching, gnawing,  waxing and waning.  The pain is significantly worse after eating and lying down.  Sometimes the pain is relieved by bending forward.  Sometimes she can feel acid coming back into her throat which gives her choking sensation.  At times she has nausea associated with the pain and has had 3 soft stools.  She denies any vomiting or radiation of pain into her back.  Her diet is not great with lots of greasy, fatty foods.  She also consumes chocolate but denies significant caffeine use.  She reports she has tried to work on this.  Denies any urinary symptoms, chest pain, shortness of breath.  She denies any blood in her stool or vomit.  She has not tried anything for her symptoms   Patient was seen 05/10/2018 in the ER for shoulder pain and since has been taking increased amounts of ibuprofen and Aleve.  She is not currently taking a PPI.   Mom had recent gallbladder surgery.  She does not smoke, or drink.  ROS per HPI     Past Medical History:  Diagnosis Date  . ADHD (attention deficit hyperactivity disorder)   . ALLERGIC RHINITIS 06/26/2008  . ANEMIA-IRON DEFICIENCY 06/26/2008  . 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  . ANKLE PAIN, LEFT 06/26/2008  . BACK PAIN, LUMBAR 10/15/2007  . BIPOLAR DISORDER UNSPECIFIED 09/13/2007  . CERVICAL RADICULOPATHY, LEFT 06/26/2008  . Cervicalgia 10/15/2007  . Chronic pain syndrome 03/21/2011  . COMMON MIGRAINE 06/26/2008  . DIABETES MELLITUS, TYPE II 06/05/2007  . DYSPNEA 12/05/2007  . EAR PAIN, RIGHT 12/05/2007  . ELEVATED BP READING WITHOUT DX  HYPERTENSION 07/29/2009  . FATIGUE 12/05/2007  . FIBROMYALGIA 06/26/2008  . GASTROENTERITIS, ACUTE 12/15/2008  . GERD 06/26/2008  . Headache(784.0) 09/13/2007  . HYPERLIPIDEMIA 09/13/2007  . JOINT EFFUSION, RIGHT KNEE 07/29/2009  . KNEE PAIN, LEFT 11/21/2010  . LUMBAR RADICULOPATHY, LEFT 06/26/2008  . OTHER DISEASE OF PHARYNX OR NASOPHARYNX 07/03/2008  . PERIPHERAL EDEMA 01/06/2009  . POLYARTHRITIS 11/21/2010  . SHOULDER PAIN, BILATERAL 07/03/2008  . SINUSITIS- ACUTE-NOS 07/29/2009  . TACHYCARDIA 12/05/2007  . THYROID NODULE, RIGHT 11/20/2008  . TREMOR 01/02/2008  . Vertigo     Patient Active Problem List   Diagnosis Date Noted  . Right foot injury, initial encounter 02/08/2018  . Bilateral knee pain 01/14/2018  . MRSA (methicillin resistant staph aureus) urine culture positive on 07/06/16 07/06/2016  . Foot injury 12/20/2015  . Thoracic back pain 12/20/2015  . Low back pain radiating to right leg 12/01/2015  . Left shoulder pain 11/07/2013  . Left flank pain 11/07/2013  . Encounter for long-term (current) use of high-risk medication 05/29/2011  . Preventative health care 05/28/2011  . Chronic pain syndrome 03/21/2011  . KNEE PAIN, LEFT 11/21/2010  . POLYARTHRITIS 11/21/2010  . BACK PAIN 11/22/2009  . JOINT EFFUSION, RIGHT KNEE 07/29/2009  . ELEVATED BP READING WITHOUT DX HYPERTENSION 07/29/2009  . 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  . PERIPHERAL EDEMA 01/06/2009  . GASTROENTERITIS, ACUTE 12/15/2008  . DYSPHAGIA UNSPECIFIED 11/23/2008  .  THYROID NODULE, RIGHT 11/20/2008  . SHOULDER PAIN, BILATERAL 07/03/2008  . ANEMIA-IRON DEFICIENCY 06/26/2008  . COMMON MIGRAINE 06/26/2008  . ALLERGIC RHINITIS 06/26/2008  . GERD 06/26/2008  . ANKLE PAIN, LEFT 06/26/2008  . CERVICAL RADICULOPATHY, LEFT 06/26/2008  . LUMBAR RADICULOPATHY, LEFT 06/26/2008  . FIBROMYALGIA 06/26/2008  . TREMOR 01/02/2008  . EAR PAIN, RIGHT 12/05/2007  . FATIGUE 12/05/2007  . TACHYCARDIA 12/05/2007  . DYSPNEA 12/05/2007  . Neck  pain 10/15/2007  . BACK PAIN, LUMBAR 10/15/2007  . Pain in Soft Tissues of Limb 10/15/2007  . Hyperlipidemia 09/13/2007  . BIPOLAR DISORDER UNSPECIFIED 09/13/2007  . Headache(784.0) 09/13/2007  . Diabetes ([place]) 06/05/2007    Past Surgical History:  Procedure Laterality Date  . back surgury     lumbar 2001  . CESAREAN SECTION     x 3  . thyroid fine needle aspiration  May 2008   showed non neoplastic goiter  . VAGINAL HYSTERECTOMY     fibroids    OB History    Gravida  3   Para  3   Term  2   Preterm  1   AB      Living  3     SAB      TAB      Ectopic      Multiple      Live Births  3            Home Medications    Prior to Admission medications   Medication Sig Start Date End Date Taking? Authorizing Provider  cetirizine (ZYRTEC) 10 MG tablet Take 1 tablet (10 mg total) by mouth daily. 03/08/18  Yes Ward, Ozella Almond, PA-C  diclofenac (VOLTAREN) 75 MG EC tablet Take 1 tablet (75 mg total) by mouth 2 (two) times daily. 05/14/18  Yes Hudnall, Sharyn Lull, MD  LO LOESTRIN FE 1 MG-10 MCG / 10 MCG tablet Take 1 tablet by mouth daily. 02/20/18  Yes Truett Mainland, DO  metFORMIN (GLUCOPHAGE) 500 MG tablet Take 1 tablet (500 mg total) by mouth 2 (two) times daily with a meal. 01/11/18  Yes Truett Mainland, DO  tiZANidine (ZANAFLEX) 4 MG tablet Take 1 tablet (4 mg total) by mouth every 6 (six) hours as needed for muscle spasms. 01/22/18  Yes Hudnall, Sharyn Lull, MD  calcium-vitamin D (OSCAL WITH D) 500-200 MG-UNIT tablet Take 1 tablet by mouth.    [provider]  HYDROcodone-acetaminophen (NORCO) 5-325 MG tablet Take 1 tablet by mouth every 6 (six) hours as needed for moderate pain. 05/14/18   Dene Gentry, MD  Multiple Vitamins-Minerals (MULTIVITAMIN WITH MINERALS) tablet Take 1 tablet by mouth daily.    [provider]  omeprazole (PRILOSEC) 20 MG capsule Take 1 capsule (20 mg total) by mouth daily. 05/17/18   Loura Halt A, NP  predniSONE  (DELTASONE) 10 MG tablet 6 tabs po days 1-2, 5 tabs po days 3-4, 4 tabs po days 5-6, 3 tabs po days 7-8, 2 tabs po days 9-10, 1 tab po days 11-12 01/30/18   Hudnall, Sharyn Lull, MD  Probiotic Product (PROBIOTIC PO) Take 1 tablet by mouth as needed.     [provider]  ranitidine (ZANTAC) 150 MG capsule Take 1 capsule (150 mg total) by mouth daily. 05/17/18   Loura Halt A, NP  sucralfate (CARAFATE) 1 g tablet Take 1 tablet (1 g total) by mouth 4 (four) times daily -  with meals and at bedtime. 05/17/18   Loura Halt  A, NP    Family History Family History  Problem Relation Age of Onset  . Thyroid disease Mother   . Diabetes Mother   . Asthma Mother   . Hypertension Father   . Hyperlipidemia Father   . Diabetes Father   . Emphysema Other   . Coronary artery disease Other   . Cancer Other        pancreatic and breast cancer  . Cancer Other        lung and prostate cancer  . Breast cancer Neg Hx     Social History Social History   Tobacco Use  . Smoking status: Never Smoker  . Smokeless tobacco: Never Used  Substance Use Topics  . Alcohol use: No  . Drug use: No     Allergies   Sulfa antibiotics; Contrast media [iodinated diagnostic agents]; and Promethazine hcl   Review of Systems Review of Systems   Physical Exam Triage Vital Signs ED Triage Vitals  Enc Vitals Group     BP 05/17/18 1007 124/81     Pulse Rate 05/17/18 1007 69     Resp 05/17/18 1007 18     Temp 05/17/18 1007 98.2 F (36.8 C)     Temp Source 05/17/18 1007 Oral     SpO2 05/17/18 1007 100 %     Weight 05/17/18 1012 193 lb (87.5 kg)     Height 05/17/18 1012 5\' 4"  (1.626 m)     Head Circumference --      Peak Flow --      Pain Score 05/17/18 1012 3     Pain Loc --      Pain Edu? --      Excl. in [place]? --    No data found.  Updated Vital Signs BP 124/81 (BP Location: Right Arm)   Pulse 69   Temp 98.2 F (36.8 C) (Oral)   Resp 18   Ht 5\' 4"  (1.626 m)   Wt 193 lb (87.5 kg)   SpO2 100%    BMI 33.13 kg/m   Visual Acuity Right Eye Distance:   Left Eye Distance:   Bilateral Distance:    Right Eye Near:   Left Eye Near:    Bilateral Near:     Physical Exam  Constitutional: She appears well-developed and well-nourished.  HENT:  Head: Normocephalic and atraumatic.  Eyes: Pupils are equal, round, and reactive to light.  Cardiovascular: Normal rate and regular rhythm.  Pulmonary/Chest: Effort normal and breath sounds normal.  Abdominal: Soft. Normal appearance, normal aorta and bowel sounds are normal. She exhibits no shifting dullness, no abdominal bruit and no ascites. There is tenderness. There is no rebound.  Abdomen soft.  Tender to palpation over epigastric and right upper quadrant area.  More tender to deep palpation.  No distention, masses, hepatomegaly, splenomegaly, aortic bruits.   Neurological: She is alert.  Skin: Skin is warm and dry. Capillary refill takes less than 2 seconds. No rash noted.  Psychiatric: She has a normal mood and affect.  Nursing note and vitals reviewed.    UC Treatments / Results  Labs (all labs ordered are listed, but only abnormal results are displayed) Labs Reviewed - No data to display  EKG None  Radiology No results found.  Procedures Procedures (including critical care time)  Medications Ordered in UC Medications  gi cocktail (Maalox,Lidocaine,Donnatal) (30 mLs Oral Given 05/17/18 1114)    Initial Impression / Assessment and Plan / UC Course  I have reviewed the  triage vital signs and the nursing notes.  Pertinent labs & imaging results that were available during my care of the patient were reviewed by me and considered in my medical decision making (see chart for details).     Possible gastritis versus duodenal ulcer versus severe GERD, vs gallstones. More  likely GERD, gastritis.  Patient is not currently taking any PPIs.  She has had increase in NSAID use in the last couple weeks due to shoulder pain.  She also  does not have a very healthy diet with lots of fatty, greasy foods.   Will try a few weeks of PPIs and H2 markers at bedtime. I will also give her some Carafate in case there is a small ulcer present. Plus this could help her abd discomfort.   If her symptoms do not improve in the next few weeks she will need to follow-up with the GI specialist.  Return precautions given. Pt agreeable to plan.  Final Clinical Impressions(s) / UC Diagnoses   Final diagnoses:  Right upper quadrant abdominal pain     Discharge Instructions     It was nice meeting you!!  I am going to give you a few different prescription to help with the abd pain.  I am not sure if this is severe GERD, gastritis or a small ulcer. We are going to try omeprazole 20 mg once daily and zantac 150 mg at night.  I will also give some Carafate in case there is a chance of an ulcer to help with this.  Make sure that you take the omeprazole 30- 60 minutes prior to a meal with a glass of water.  Avoid spicy, greasy foods, caffeine, chocolate and milk products.  No eating 2-3 hours before bedtime. Elevate the head of the bed 30 degrees.  Try this for a few weeks to see if this improves your symptoms.  If you don't see any improvement or your symptoms worsen please follow up with a GI.  It could take a few weeks before you see any true improvement in symptoms.       ED Prescriptions    Medication Sig Dispense Auth. Provider   omeprazole (PRILOSEC) 20 MG capsule Take 1 capsule (20 mg total) by mouth daily. 30 capsule Jaleesa Cervi A, NP   ranitidine (ZANTAC) 150 MG capsule Take 1 capsule (150 mg total) by mouth daily. 30 capsule Adamariz Gillott A, NP   sucralfate (CARAFATE) 1 g tablet Take 1 tablet (1 g total) by mouth 4 (four) times daily -  with meals and at bedtime. 30 tablet Loura Halt A, NP     Controlled Substance Prescriptions [place] Controlled Substance Registry consulted? Not Applicable   Orvan July, NP 05/17/18 1126

## 2018-05-17 NOTE — ED Triage Notes (Signed)
Patient states she is having intermittent right side abdominal pain.  States it has been coming and going for several days.

## 2018-05-17 NOTE — Discharge Instructions (Addendum)
It was nice meeting you!!  I am going to give you a few different prescription to help with the abd pain.  I am not sure if this is severe GERD, gastritis or a small ulcer. We are going to try omeprazole 20 mg once daily and zantac 150 mg at night.  I will also give some Carafate in case there is a chance of an ulcer to help with this.  Make sure that you take the omeprazole 30- 60 minutes prior to a meal with a glass of water.  Avoid spicy, greasy foods, caffeine, chocolate and milk products.  No eating 2-3 hours before bedtime. Elevate the head of the bed 30 degrees.  Try this for a few weeks to see if this improves your symptoms.  If you don't see any improvement or your symptoms worsen please follow up with a GI.  It could take a few weeks before you see any true improvement in symptoms.

## 2018-05-21 ENCOUNTER — Encounter: Payer: Self-pay | Admitting: Family Medicine

## 2018-05-21 NOTE — Progress Notes (Unsigned)
Patient's insurance denied her diclofenac.  Would she like to try meloxicam (once a day), naproxen (500mg  twice a day), or ibuprofen (600mg  three times a day) instead?  Looks like insurance will cover one of these.

## 2018-05-22 ENCOUNTER — Telehealth: Payer: Self-pay | Admitting: Family Medicine

## 2018-05-22 MED ORDER — NAPROXEN 500 MG PO TABS: 500.0000 mg | ORAL_TABLET | Freq: Two times a day (BID) | ORAL | 2 refills | Status: DC | PRN

## 2018-05-22 NOTE — Telephone Encounter (Signed)
Ok, sent this in for her.  Thanks!

## 2018-05-22 NOTE — Telephone Encounter (Signed)
Spoke to patient regarding denial of Diclofenac and informed her of other medication options. She would like to try Naproxen instead.   [facility] [place]  (Unable to document in other telephone call)

## 2018-05-23 NOTE — Telephone Encounter (Signed)
Patient was informed.

## 2018-06-05 ENCOUNTER — Encounter (HOSPITAL_BASED_OUTPATIENT_CLINIC_OR_DEPARTMENT_OTHER): Payer: Self-pay | Admitting: Emergency Medicine

## 2018-06-05 ENCOUNTER — Other Ambulatory Visit: Payer: Self-pay

## 2018-06-05 ENCOUNTER — Emergency Department (HOSPITAL_BASED_OUTPATIENT_CLINIC_OR_DEPARTMENT_OTHER): Payer: Medicaid Other

## 2018-06-05 ENCOUNTER — Emergency Department (HOSPITAL_BASED_OUTPATIENT_CLINIC_OR_DEPARTMENT_OTHER)
Admission: EM | Admit: 2018-06-05 | Discharge: 2018-06-05 | Disposition: A | Discharge status: Home / Self Care | Payer: Medicaid Other | Attending: Emergency Medicine | Admitting: Emergency Medicine

## 2018-06-05 DIAGNOSIS — E119 Type 2 diabetes mellitus without complications: Secondary | ICD-10-CM | POA: Insufficient documentation

## 2018-06-05 DIAGNOSIS — R112 Nausea with vomiting, unspecified: Secondary | ICD-10-CM | POA: Diagnosis not present

## 2018-06-05 DIAGNOSIS — R1011 Right upper quadrant pain: Secondary | ICD-10-CM | POA: Insufficient documentation

## 2018-06-05 DIAGNOSIS — R079 Chest pain, unspecified: Secondary | ICD-10-CM | POA: Diagnosis not present

## 2018-06-05 DIAGNOSIS — Z7984 Long term (current) use of oral hypoglycemic drugs: Secondary | ICD-10-CM | POA: Insufficient documentation

## 2018-06-05 DIAGNOSIS — Z79899 Other long term (current) drug therapy: Secondary | ICD-10-CM | POA: Insufficient documentation

## 2018-06-05 DIAGNOSIS — M549 Dorsalgia, unspecified: Secondary | ICD-10-CM | POA: Insufficient documentation

## 2018-06-05 DIAGNOSIS — K760 Fatty (change of) liver, not elsewhere classified: Secondary | ICD-10-CM | POA: Diagnosis not present

## 2018-06-05 LAB — CBC WITH DIFFERENTIAL/PLATELET
Basophils Absolute: 0 10*3/uL (ref 0.0–0.1)
Basophils Relative: 0 %
Eosinophils Absolute: 0.2 10*3/uL (ref 0.0–0.7)
Eosinophils Relative: 2 %
HCT: 39.4 % (ref 36.0–46.0)
Hemoglobin: 13.1 g/dL (ref 12.0–15.0)
Lymphocytes Relative: 32 %
Lymphs Abs: 2.1 10*3/uL (ref 0.7–4.0)
MCH: 27.9 pg (ref 26.0–34.0)
MCHC: 33.2 g/dL (ref 30.0–36.0)
MCV: 84 fL (ref 78.0–100.0)
Monocytes Absolute: 0.6 10*3/uL (ref 0.1–1.0)
Monocytes Relative: 9 %
Neutro Abs: 3.6 10*3/uL (ref 1.7–7.7)
Neutrophils Relative %: 57 %
Platelets: 274 10*3/uL (ref 150–400)
RBC: 4.69 MIL/uL (ref 3.87–5.11)
RDW: 13.3 % (ref 11.5–15.5)
WBC: 6.4 10*3/uL (ref 4.0–10.5)

## 2018-06-05 LAB — URINALYSIS, MICROSCOPIC (REFLEX)

## 2018-06-05 LAB — URINALYSIS, ROUTINE W REFLEX MICROSCOPIC
Bilirubin Urine: NEGATIVE
Glucose, UA: >=500 mg/dL — AB
Hgb urine dipstick: NEGATIVE
Ketones, ur: 15 mg/dL — AB
Leukocytes, UA: NEGATIVE
Nitrite: NEGATIVE
Protein, ur: NEGATIVE mg/dL
Specific Gravity, Urine: 1.01 (ref 1.005–1.030)
pH: 6 (ref 5.0–8.0)

## 2018-06-05 LAB — COMPREHENSIVE METABOLIC PANEL
ALT: 31 U/L (ref 0–44)
AST: 39 U/L (ref 15–41)
Albumin: 3.7 g/dL (ref 3.5–5.0)
Alkaline Phosphatase: 118 U/L (ref 38–126)
Anion gap: 10 (ref 5–15)
BUN: 10 mg/dL (ref 6–20)
CO2: 24 mmol/L (ref 22–32)
Calcium: 8.8 mg/dL — ABNORMAL LOW (ref 8.9–10.3)
Chloride: 101 mmol/L (ref 98–111)
Creatinine, Ser: 0.86 mg/dL (ref 0.44–1.00)
GFR calc Af Amer: >60 mL/min (ref >60)
GFR calc non Af Amer: >60 mL/min (ref >60)
Glucose, Bld: 379 mg/dL — ABNORMAL HIGH (ref 70–99)
Potassium: 5 mmol/L (ref 3.5–5.1)
Sodium: 135 mmol/L (ref 135–145)
Total Bilirubin: 0.6 mg/dL (ref 0.3–1.2)
Total Protein: 7.1 g/dL (ref 6.5–8.1)

## 2018-06-05 LAB — LIPASE, BLOOD: Lipase: 37 U/L (ref 11–51)

## 2018-06-05 MED ORDER — DICYCLOMINE HCL 20 MG PO TABS: 20.0000 mg | ORAL_TABLET | Freq: Two times a day (BID) | ORAL | 0 refills | Status: DC

## 2018-06-05 MED ORDER — BACLOFEN 20 MG PO TABS: 20.0000 mg | ORAL_TABLET | Freq: Three times a day (TID) | ORAL | 0 refills | Status: DC

## 2018-06-05 MED ORDER — FENTANYL CITRATE (PF) 100 MCG/2ML IJ SOLN
50.0000 ug | Freq: Once | INTRAMUSCULAR | Status: AC
  Administered 2018-06-05: 50 ug via INTRAVENOUS
  Filled 2018-06-05: qty 2

## 2018-06-05 MED ORDER — ONDANSETRON HCL 4 MG/2ML IJ SOLN
4.0000 mg | Freq: Once | INTRAMUSCULAR | Status: AC
  Administered 2018-06-05: 4 mg via INTRAVENOUS
  Filled 2018-06-05: qty 2

## 2018-06-05 MED ORDER — SODIUM CHLORIDE 0.9 % IV BOLUS (SEPSIS)
1000.0000 mL | Freq: Once | INTRAVENOUS | Status: AC
  Administered 2018-06-05: 1000 mL via INTRAVENOUS

## 2018-06-05 MED FILL — DICYCLOMINE 20 MG TABLET: 20 | 10 days supply | Qty: 20 | Fill #0

## 2018-06-05 NOTE — ED Notes (Signed)
Patient transported to Ultrasound 

## 2018-06-05 NOTE — ED Triage Notes (Signed)
Pt is c/o upper abd pain that started on the right side several days ago  Pt was seen at [facility] on July 26th for same  Pt states the pain is intermittent and today she has a knot in that area  Pt has had nausea but denies at this time  Pt states she has had some pain in the middle of her stomach and it has moved to her left side but not at this time  Pt states she has had some pain up in her back and shoulder area

## 2018-06-05 NOTE — ED Provider Notes (Signed)
[facility] EMERGENCY DEPARTMENT Provider Note   CSN: [id number] Arrival date & time: 06/05/18  0353     History   Chief Complaint Chief Complaint  Patient presents with  . Abdominal Pain    HPI Shirley [name] is a 42 y.o. female.  The history is provided by the patient.  Abdominal Pain   This is a recurrent problem. The current episode started more than 1 week ago. The problem occurs daily. The problem has been gradually worsening. The pain is associated with eating. The pain is located in the RUQ and epigastric region. The pain is severe. Associated symptoms include nausea and vomiting. Pertinent negatives include fever, diarrhea, hematochezia, melena and dysuria. The symptoms are aggravated by certain positions, eating and palpation. Nothing relieves the symptoms.  With history of diabetes presents with abdominal pain.  She reports for over a month she is been having upper abdominal pain.  She reports some right upper and middle of her abdomen.  She also has some pain in her back at times.  She reports recent nausea vomiting, denies hematemesis.  No fever.  No lower abdominal pain. Previous surgery includes hysterectomy.  Seen back in July for this, was diagnosed with gastritis given multiple medications.  She does report use of multiple NSAIDs in the past but is trying to cut back on this.  Denies alcohol abuse.  Past Medical History:  Diagnosis Date  . ADHD (attention deficit hyperactivity disorder)   . ALLERGIC RHINITIS 06/26/2008  . ANEMIA-IRON DEFICIENCY 06/26/2008  . 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  . ANKLE PAIN, LEFT 06/26/2008  . BACK PAIN, LUMBAR 10/15/2007  . BIPOLAR DISORDER UNSPECIFIED 09/13/2007  . CERVICAL RADICULOPATHY, LEFT 06/26/2008  . Cervicalgia 10/15/2007  . Chronic pain syndrome 03/21/2011  . COMMON MIGRAINE 06/26/2008  . DIABETES MELLITUS, TYPE II 06/05/2007  . DYSPNEA 12/05/2007  . EAR PAIN, RIGHT 12/05/2007  . ELEVATED BP READING WITHOUT DX HYPERTENSION  07/29/2009  . FATIGUE 12/05/2007  . FIBROMYALGIA 06/26/2008  . GASTROENTERITIS, ACUTE 12/15/2008  . GERD 06/26/2008  . Headache(784.0) 09/13/2007  . HYPERLIPIDEMIA 09/13/2007  . JOINT EFFUSION, RIGHT KNEE 07/29/2009  . KNEE PAIN, LEFT 11/21/2010  . LUMBAR RADICULOPATHY, LEFT 06/26/2008  . OTHER DISEASE OF PHARYNX OR NASOPHARYNX 07/03/2008  . PERIPHERAL EDEMA 01/06/2009  . POLYARTHRITIS 11/21/2010  . SHOULDER PAIN, BILATERAL 07/03/2008  . SINUSITIS- ACUTE-NOS 07/29/2009  . TACHYCARDIA 12/05/2007  . THYROID NODULE, RIGHT 11/20/2008  . TREMOR 01/02/2008  . Vertigo     Patient Active Problem List   Diagnosis Date Noted  . Right foot injury, initial encounter 02/08/2018  . Bilateral knee pain 01/14/2018  . MRSA (methicillin resistant staph aureus) urine culture positive on 07/06/16 07/06/2016  . Foot injury 12/20/2015  . Thoracic back pain 12/20/2015  . Low back pain radiating to right leg 12/01/2015  . Left shoulder pain 11/07/2013  . Left flank pain 11/07/2013  . Encounter for long-term (current) use of high-risk medication 05/29/2011  . Preventative health care 05/28/2011  . Chronic pain syndrome 03/21/2011  . KNEE PAIN, LEFT 11/21/2010  . POLYARTHRITIS 11/21/2010  . BACK PAIN 11/22/2009  . JOINT EFFUSION, RIGHT KNEE 07/29/2009  . ELEVATED BP READING WITHOUT DX HYPERTENSION 07/29/2009  . 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  . PERIPHERAL EDEMA 01/06/2009  . GASTROENTERITIS, ACUTE 12/15/2008  . DYSPHAGIA UNSPECIFIED 11/23/2008  . THYROID NODULE, RIGHT 11/20/2008  . SHOULDER PAIN, BILATERAL 07/03/2008  . ANEMIA-IRON DEFICIENCY 06/26/2008  . COMMON MIGRAINE 06/26/2008  . ALLERGIC RHINITIS 06/26/2008  . GERD 06/26/2008  .  ANKLE PAIN, LEFT 06/26/2008  . CERVICAL RADICULOPATHY, LEFT 06/26/2008  . LUMBAR RADICULOPATHY, LEFT 06/26/2008  . FIBROMYALGIA 06/26/2008  . TREMOR 01/02/2008  . EAR PAIN, RIGHT 12/05/2007  . FATIGUE 12/05/2007  . TACHYCARDIA 12/05/2007  . DYSPNEA 12/05/2007  . Neck pain  10/15/2007  . BACK PAIN, LUMBAR 10/15/2007  . Pain in Soft Tissues of Limb 10/15/2007  . Hyperlipidemia 09/13/2007  . BIPOLAR DISORDER UNSPECIFIED 09/13/2007  . Headache(784.0) 09/13/2007  . Diabetes ([place]) 06/05/2007    Past Surgical History:  Procedure Laterality Date  . back surgury     lumbar 2001  . CESAREAN SECTION     x 3  . thyroid fine needle aspiration  May 2008   showed non neoplastic goiter  . VAGINAL HYSTERECTOMY     fibroids     OB History    Gravida  3   Para  3   Term  2   Preterm  1   AB      Living  3     SAB      TAB      Ectopic      Multiple      Live Births  3            Home Medications    Prior to Admission medications   Medication Sig Start Date End Date Taking? Authorizing Provider  calcium-vitamin D (OSCAL WITH D) 500-200 MG-UNIT tablet Take 1 tablet by mouth.    [provider]  cetirizine (ZYRTEC) 10 MG tablet Take 1 tablet (10 mg total) by mouth daily. 03/08/18   Ward, Ozella Almond, PA-C  diclofenac (VOLTAREN) 75 MG EC tablet Take 1 tablet (75 mg total) by mouth 2 (two) times daily. 05/14/18   Hudnall, Sharyn Lull, MD  HYDROcodone-acetaminophen (NORCO) 5-325 MG tablet Take 1 tablet by mouth every 6 (six) hours as needed for moderate pain. 05/14/18   Hudnall, Sharyn Lull, MD  LO LOESTRIN FE 1 MG-10 MCG / 10 MCG tablet Take 1 tablet by mouth daily. 02/20/18   Truett Mainland, DO  metFORMIN (GLUCOPHAGE) 500 MG tablet Take 1 tablet (500 mg total) by mouth 2 (two) times daily with a meal. 01/11/18   Truett Mainland, DO  Multiple Vitamins-Minerals (MULTIVITAMIN WITH MINERALS) tablet Take 1 tablet by mouth daily.    [provider]  naproxen (NAPROSYN) 500 MG tablet Take 1 tablet (500 mg total) by mouth 2 (two) times daily as needed. 05/22/18   Hudnall, Sharyn Lull, MD  omeprazole (PRILOSEC) 20 MG capsule Take 1 capsule (20 mg total) by mouth daily. 05/17/18   Loura Halt A, NP  predniSONE (DELTASONE) 10 MG tablet 6 tabs po days  1-2, 5 tabs po days 3-4, 4 tabs po days 5-6, 3 tabs po days 7-8, 2 tabs po days 9-10, 1 tab po days 11-12 01/30/18   Hudnall, Sharyn Lull, MD  Probiotic Product (PROBIOTIC PO) Take 1 tablet by mouth as needed.     [provider]  ranitidine (ZANTAC) 150 MG capsule Take 1 capsule (150 mg total) by mouth daily. 05/17/18   Loura Halt A, NP  sucralfate (CARAFATE) 1 g tablet Take 1 tablet (1 g total) by mouth 4 (four) times daily -  with meals and at bedtime. 05/17/18   Loura Halt A, NP  tiZANidine (ZANAFLEX) 4 MG tablet Take 1 tablet (4 mg total) by mouth every 6 (six) hours as needed for muscle spasms. 01/22/18   Dene Gentry, MD  Family History Family History  Problem Relation Age of Onset  . Thyroid disease Mother   . Diabetes Mother   . Asthma Mother   . Hypertension Father   . Hyperlipidemia Father   . Diabetes Father   . Emphysema Other   . Coronary artery disease Other   . Cancer Other        pancreatic and breast cancer  . Cancer Other        lung and prostate cancer  . Breast cancer Neg Hx     Social History Social History   Tobacco Use  . Smoking status: Never Smoker  . Smokeless tobacco: Never Used  Substance Use Topics  . Alcohol use: No  . Drug use: No     Allergies   Sulfa antibiotics; Contrast media [iodinated diagnostic agents]; and Promethazine hcl   Review of Systems Review of Systems  Constitutional: Negative for fever.  Cardiovascular:       Reports chest tightness last night  Gastrointestinal: Positive for abdominal pain, nausea and vomiting. Negative for diarrhea, hematochezia and melena.  Genitourinary: Negative for dysuria and vaginal discharge.  All other systems reviewed and are negative.    Physical Exam Updated Vital Signs BP (!) 145/107 (BP Location: Right Arm)   Pulse (!) 104   Temp 97.9 F (36.6 C) (Oral)   Resp 18   SpO2 100%   Physical Exam CONSTITUTIONAL: Well developed/well nourished HEAD:  Normocephalic/atraumatic EYES: EOMI/PERRL no icterus ENMT: Mucous membranes moist NECK: supple no meningeal signs SPINE/BACK:entire spine nontender CV: S1/S2 noted, no murmurs/rubs/gallops noted LUNGS: Lungs are clear to auscultation bilaterally, no apparent distress ABDOMEN: soft, moderate right upper quadrant and epigastric tenderness, no rebound or guarding, bowel sounds noted throughout abdomen GU:no cva tenderness NEURO: Pt is awake/alert/appropriate, moves all extremitiesx4.  No facial droop.   EXTREMITIES: pulses normal/equal, full ROM SKIN: warm, color normal PSYCH: no abnormalities of mood noted, alert and oriented to situation   ED Treatments / Results  Labs (all labs ordered are listed, but only abnormal results are displayed) Labs Reviewed  URINALYSIS, ROUTINE W REFLEX MICROSCOPIC - Abnormal; Notable for the following components:      Result Value   Glucose, UA >=500 (*)    Ketones, ur 15 (*)    All other components within normal limits  URINALYSIS, MICROSCOPIC (REFLEX) - Abnormal; Notable for the following components:   Bacteria, UA FEW (*)    All other components within normal limits  COMPREHENSIVE METABOLIC PANEL - Abnormal; Notable for the following components:   Glucose, Bld 379 (*)    Calcium 8.8 (*)    All other components within normal limits  CBC WITH DIFFERENTIAL/PLATELET  LIPASE, BLOOD    EKG EKG Interpretation  Date/Time:  Wednesday June 05 2018 05:34:58 EDT Ventricular Rate:  91 PR Interval:    QRS Duration: 143 QT Interval:  391 QTC Calculation: 482 R Axis:   94 Text Interpretation:  Sinus rhythm RBBB and LPFB Confirmed by Ripley Fraise [phone]) on 06/05/2018 6:00:11 AM   Radiology No results found.  Procedures Procedures  Medications Ordered in ED Medications  sodium chloride 0.9 % bolus 1,000 mL (1,000 mLs Intravenous New Bag/Given 06/05/18 0627)  fentaNYL (SUBLIMAZE) injection 50 mcg (50 mcg Intravenous Given 06/05/18 0554)   ondansetron (ZOFRAN) injection 4 mg (4 mg Intravenous Given 06/05/18 0552)     Initial Impression / Assessment and Plan / ED Course  I have reviewed the triage vital signs and the nursing notes.  Pertinent  labs & imaging results that were available during my care of the patient were reviewed by me and considered in my medical decision making (see chart for details).     6:35 AM Patient With episodes of epigastric and right upper quadrant pain for past several weeks.  She is focally tender on exam.  She will need further evaluation of her gallbladder 6:57 AM Signed out to dr Sedonia Small with US imaging pending If negative can be discharged with GI followup Advised patient to cut back on NSAID use Final Clinical Impressions(s) / ED Diagnoses   Final diagnoses:  None    ED Discharge Orders    None       Ripley Fraise, MD 06/05/18 [phone]

## 2018-06-05 NOTE — Discharge Instructions (Addendum)
As we discussed, please cut back on the use of naproxen and diclofenac

## 2018-06-05 NOTE — ED Provider Notes (Signed)
  Provider Note MRN:  [id number]  Arrival date & time: 06/05/18    ED Course and Medical Decision Making  I received sign out for this patient at shift change from Dr. Christy Gentles.  Clinical Course as of Jun 06 831  Wed Jun 05, 2018  0707 Ruq [name], pain for month, home and GI fu if neg   [MB]    Clinical Course User Index [MB] Maudie Flakes, MD    Right upper quadrant ultrasound with no evidence to suggest cholecystitis.  Revealing possible fatty liver disease.  These findings discussed with the patient, patient given contact information for gastroenterology specialist service.  Prescription for Bentyl.  After the discussed management above, the patient was determined to be safe for discharge.  The patient was in agreement with this plan and all questions regarding their care were answered.  ED return precautions were discussed and the patient will return to the ED with any significant worsening of condition.  Barth Kirks. Sedonia Small, [place] [email or web address]    Maudie Flakes, MD 06/05/18 1444

## 2018-06-05 NOTE — ED Notes (Signed)
ED Provider at bedside. 

## 2018-06-05 NOTE — ED Notes (Signed)
Warm blanket given

## 2018-06-11 ENCOUNTER — Encounter: Payer: Self-pay | Admitting: Family Medicine

## 2018-06-11 ENCOUNTER — Ambulatory Visit (INDEPENDENT_AMBULATORY_CARE_PROVIDER_SITE_OTHER): Payer: Medicaid Other | Admitting: Family Medicine

## 2018-06-11 VITALS — BP 124/86 | HR 111 | Ht 64.0 in | Wt 193.0 lb

## 2018-06-11 DIAGNOSIS — M25511 Pain in right shoulder: Secondary | ICD-10-CM

## 2018-06-11 DIAGNOSIS — M25512 Pain in left shoulder: Secondary | ICD-10-CM | POA: Diagnosis not present

## 2018-06-11 NOTE — Patient Instructions (Signed)
You have rotator cuff impingement and rhomboid spasms. Try to avoid painful activities (overhead activities, lifting with extended arm) as much as possible. Naproxen 500mg  twice a day with food. Tizanidine as needed for spasms. I'd avoid the tylenol for now until you get the liver issue figured out. Consider physical therapy with transition to home exercise program - call me if you want to go ahead with this. Do home exercise program with theraband and scapular stabilization exercises daily 3 sets of 10 once a day. Follow up with me in 6 weeks.

## 2018-06-12 ENCOUNTER — Encounter: Payer: Self-pay | Admitting: Family Medicine

## 2018-06-12 NOTE — Progress Notes (Signed)
PCP: Biagio Borg, MD  Subjective:   HPI: Patient is a 42 y.o. female here for neck, shoulder, left lower leg pain.  7/23: Patient reports she recently started a new job over past month. 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 No acute injury but developed pain initially in the left bicep then into chest then back area. Worse at night and at work. Pain up to 7/10 level and sharp. No numbness, skin changes.  8/20: Patient reports she's doing better than last visit. 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 For the past couple weeks she has had off-and-on pain in the right greater than left posterior shoulders. She has been doing relative rest, took diclofenac with tizanidine and Norco as needed. She is now working at a temp job. Pain in the shoulder blade area 3 out of 10. She did state since yesterday she has had a little bit of left lower leg pain and some numbness only in the shin area anteriorly, nothing into the foot.  Pain is been 4 out of 10 and more of a discomfort. No skin changes.  Past Medical History:  Diagnosis Date  . ADHD (attention deficit hyperactivity disorder)   . ALLERGIC RHINITIS 06/26/2008  . ANEMIA-IRON DEFICIENCY 06/26/2008  . 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  . ANKLE PAIN, LEFT 06/26/2008  . BACK PAIN, LUMBAR 10/15/2007  . BIPOLAR DISORDER UNSPECIFIED 09/13/2007  . CERVICAL RADICULOPATHY, LEFT 06/26/2008  . Cervicalgia 10/15/2007  . Chronic pain syndrome 03/21/2011  . COMMON MIGRAINE 06/26/2008  . DIABETES MELLITUS, TYPE II 06/05/2007  . DYSPNEA 12/05/2007  . EAR PAIN, RIGHT 12/05/2007  . ELEVATED BP READING WITHOUT DX HYPERTENSION 07/29/2009  . FATIGUE 12/05/2007  . FIBROMYALGIA 06/26/2008  . GASTROENTERITIS, ACUTE 12/15/2008  . GERD 06/26/2008  . Headache(784.0) 09/13/2007  . HYPERLIPIDEMIA 09/13/2007  . JOINT EFFUSION, RIGHT KNEE 07/29/2009  . KNEE PAIN, LEFT 11/21/2010  . LUMBAR RADICULOPATHY, LEFT 06/26/2008  . OTHER DISEASE OF  PHARYNX OR NASOPHARYNX 07/03/2008  . PERIPHERAL EDEMA 01/06/2009  . POLYARTHRITIS 11/21/2010  . SHOULDER PAIN, BILATERAL 07/03/2008  . SINUSITIS- ACUTE-NOS 07/29/2009  . TACHYCARDIA 12/05/2007  . THYROID NODULE, RIGHT 11/20/2008  . TREMOR 01/02/2008  . Vertigo     Current Outpatient Medications on File Prior to Visit  Medication Sig Dispense Refill  . calcium-vitamin D (OSCAL WITH D) 500-200 MG-UNIT tablet Take 1 tablet by mouth.    . cetirizine (ZYRTEC) 10 MG tablet Take 1 tablet (10 mg total) by mouth daily. 30 tablet 0  . dicyclomine (BENTYL) 20 MG tablet Take 1 tablet (20 mg total) by mouth 2 (two) times daily. 20 tablet 0  . LO LOESTRIN FE 1 MG-10 MCG / 10 MCG tablet Take 1 tablet by mouth daily. 3 Package 3  . metFORMIN (GLUCOPHAGE) 500 MG tablet Take 1 tablet (500 mg total) by mouth 2 (two) times daily with a meal. 60 tablet 5  . Multiple Vitamins-Minerals (MULTIVITAMIN WITH MINERALS) tablet Take 1 tablet by mouth daily.    . naproxen (NAPROSYN) 500 MG tablet Take 1 tablet (500 mg total) by mouth 2 (two) times daily as needed. 60 tablet 2  . omeprazole (PRILOSEC) 20 MG capsule Take 1 capsule (20 mg total) by mouth daily. 30 capsule 1  . Probiotic Product (PROBIOTIC PO) Take 1 tablet by mouth as needed.     . ranitidine (ZANTAC) 150 MG capsule Take 1 capsule (150 mg total) by mouth daily. 30 capsule 1  . sucralfate (  CARAFATE) 1 g tablet Take 1 tablet (1 g total) by mouth 4 (four) times daily -  with meals and at bedtime. 30 tablet 0  . tiZANidine (ZANAFLEX) 4 MG tablet Take 1 tablet (4 mg total) by mouth every 6 (six) hours as needed for muscle spasms. 60 tablet 1   No current facility-administered medications on file prior to visit.     Past Surgical History:  Procedure Laterality Date  . back surgury     lumbar 2001  . CESAREAN SECTION     x 3  . thyroid fine needle aspiration  May 2008   showed non neoplastic goiter  . VAGINAL HYSTERECTOMY     fibroids    Allergies   Allergen Reactions  . Sulfa Antibiotics Anaphylaxis  . Contrast Media [Iodinated Diagnostic Agents] Itching    Mri, severe heaving ~ can take benadryl without reaction  . Promethazine Hcl     Iv dose with benadryl causes severe aggrivation    Social History   Socioeconomic History  . Marital status: Married    Spouse name: Not on file  . Number of children: 3  . Years of education: Not on file  . Highest education level: Not on file  Occupational History    Employer: UNEMPLOYED  Social Needs  . Financial resource strain: Not on file  . Food insecurity:    Worry: Not on file    Inability: Not on file  . Transportation needs:    Medical: Not on file    Non-medical: Not on file  Tobacco Use  . Smoking status: Never Smoker  . Smokeless tobacco: Never Used  Substance and Sexual Activity  . Alcohol use: No  . Drug use: No  . Sexual activity: Not on file  Lifestyle  . Physical activity:    Days per week: Not on file    Minutes per session: Not on file  . Stress: Not on file  Relationships  . Social connections:    Talks on phone: Not on file    Gets together: Not on file    Attends religious service: Not on file    Active member of club or organization: Not on file    Attends meetings of clubs or organizations: Not on file    Relationship status: Not on file  . Intimate partner violence:    Fear of current or ex partner: Not on file    Emotionally abused: Not on file    Physically abused: Not on file    Forced sexual activity: Not on file  Other Topics Concern  . Not on file  Social History Narrative  . Not on file    Family History  Problem Relation Age of Onset  . Thyroid disease Mother   . Diabetes Mother   . Asthma Mother   . Hypertension Father   . Hyperlipidemia Father   . Diabetes Father   . Emphysema Other   . Coronary artery disease Other   . Cancer Other        pancreatic and breast cancer  . Cancer Other        lung and prostate cancer  .  Breast cancer Neg Hx     BP 124/86   Pulse (!) 111   Ht 5\' 4"  (1.626 m)   Wt 193 lb (87.5 kg)   BMI 33.13 kg/m   Review of Systems: See HPI above.     Objective:  Physical Exam:  Gen: NAD, comfortable in exam  room  Neck: No gross deformity, swelling, bruising. TTP R > L rhomboids.  No midline/bony TTP. FROM without pain. BUE strength 5/5.   Sensation intact to light touch.   2+ equal reflexes in triceps, biceps, brachioradialis tendons. Negative spurlings. NV intact distal BUEs.  Bilateral shoulders: No swelling, ecchymoses.  No gross deformity. No TTP. FROM. Positive Hawkins, Neers. Negative Yergasons. Strength 5/5 with empty can and resisted internal/external rotation.  Pain empty can. Negative apprehension. NV intact distally.  Left lower leg: No deformity. FROM with 5/5 strength. No tenderness to palpation. NVI distally. Sensation intact to light touch currently.  Assessment & Plan:  1. Bilateral shoulder pain - 2/2 rotator cuff impingement and rhomboid spasms.  Naproxen twice a day with tizanidine as needed.  Shown home exercises to do daily as well as stretches.  She will consider physical therapy.  Follow-up in 6 weeks.  2.  Left lower leg pain, numbness -distribution is not anatomic and not concerning.  Exam is also reassuring.  Advised that we just monitor this.

## 2018-06-13 ENCOUNTER — Encounter (HOSPITAL_BASED_OUTPATIENT_CLINIC_OR_DEPARTMENT_OTHER): Payer: Self-pay | Admitting: Emergency Medicine

## 2018-06-13 ENCOUNTER — Emergency Department (HOSPITAL_BASED_OUTPATIENT_CLINIC_OR_DEPARTMENT_OTHER)
Admission: EM | Admit: 2018-06-13 | Discharge: 2018-06-13 | Disposition: A | Discharge status: Home / Self Care | Payer: Medicaid Other | Attending: Emergency Medicine | Admitting: Emergency Medicine

## 2018-06-13 ENCOUNTER — Other Ambulatory Visit: Payer: Self-pay

## 2018-06-13 DIAGNOSIS — E119 Type 2 diabetes mellitus without complications: Secondary | ICD-10-CM | POA: Diagnosis not present

## 2018-06-13 DIAGNOSIS — Z7984 Long term (current) use of oral hypoglycemic drugs: Secondary | ICD-10-CM | POA: Insufficient documentation

## 2018-06-13 DIAGNOSIS — R1110 Vomiting, unspecified: Secondary | ICD-10-CM | POA: Diagnosis present

## 2018-06-13 DIAGNOSIS — R112 Nausea with vomiting, unspecified: Secondary | ICD-10-CM

## 2018-06-13 DIAGNOSIS — Z79899 Other long term (current) drug therapy: Secondary | ICD-10-CM | POA: Diagnosis not present

## 2018-06-13 LAB — COMPREHENSIVE METABOLIC PANEL
ALT: 22 U/L (ref 0–44)
AST: 21 U/L (ref 15–41)
Albumin: 4.2 g/dL (ref 3.5–5.0)
Alkaline Phosphatase: 82 U/L (ref 38–126)
Anion gap: 10 (ref 5–15)
BUN: 6 mg/dL (ref 6–20)
CO2: 26 mmol/L (ref 22–32)
Calcium: 9.4 mg/dL (ref 8.9–10.3)
Chloride: 99 mmol/L (ref 98–111)
Creatinine, Ser: 0.56 mg/dL (ref 0.44–1.00)
GFR calc Af Amer: >60 mL/min (ref >60)
GFR calc non Af Amer: >60 mL/min (ref >60)
Glucose, Bld: 166 mg/dL — ABNORMAL HIGH (ref 70–99)
Potassium: 4.1 mmol/L (ref 3.5–5.1)
Sodium: 135 mmol/L (ref 135–145)
Total Bilirubin: 0.7 mg/dL (ref 0.3–1.2)
Total Protein: 8.2 g/dL — ABNORMAL HIGH (ref 6.5–8.1)

## 2018-06-13 LAB — URINALYSIS, ROUTINE W REFLEX MICROSCOPIC
Bilirubin Urine: NEGATIVE
Glucose, UA: NEGATIVE mg/dL
Hgb urine dipstick: NEGATIVE
Ketones, ur: 15 mg/dL — AB
Leukocytes, UA: NEGATIVE
Nitrite: NEGATIVE
Protein, ur: NEGATIVE mg/dL
Specific Gravity, Urine: 1.025 (ref 1.005–1.030)
pH: 6 (ref 5.0–8.0)

## 2018-06-13 LAB — CBC WITH DIFFERENTIAL/PLATELET
Basophils Absolute: 0 10*3/uL (ref 0.0–0.1)
Basophils Relative: 0 %
Eosinophils Absolute: 0.1 10*3/uL (ref 0.0–0.7)
Eosinophils Relative: 1 %
HCT: 41.4 % (ref 36.0–46.0)
Hemoglobin: 14.1 g/dL (ref 12.0–15.0)
Lymphocytes Relative: 26 %
Lymphs Abs: 2 10*3/uL (ref 0.7–4.0)
MCH: 27.9 pg (ref 26.0–34.0)
MCHC: 34.1 g/dL (ref 30.0–36.0)
MCV: 82 fL (ref 78.0–100.0)
Monocytes Absolute: 0.6 10*3/uL (ref 0.1–1.0)
Monocytes Relative: 7 %
Neutro Abs: 4.9 10*3/uL (ref 1.7–7.7)
Neutrophils Relative %: 66 %
Platelets: 349 10*3/uL (ref 150–400)
RBC: 5.05 MIL/uL (ref 3.87–5.11)
RDW: 13.2 % (ref 11.5–15.5)
WBC: 7.6 10*3/uL (ref 4.0–10.5)

## 2018-06-13 LAB — LIPASE, BLOOD: Lipase: 32 U/L (ref 11–51)

## 2018-06-13 MED ORDER — SUCRALFATE 1 GM/10ML PO SUSP
1.0000 g | Freq: Three times a day (TID) | ORAL | Status: DC
  Administered 2018-06-13: 1 g via ORAL
  Filled 2018-06-13: qty 10

## 2018-06-13 MED ORDER — RANITIDINE HCL 150 MG PO CAPS: 150.0000 mg | ORAL_CAPSULE | Freq: Every day | ORAL | 1 refills | Status: DC

## 2018-06-13 MED ORDER — METOCLOPRAMIDE HCL 10 MG PO TABS: 10.0000 mg | ORAL_TABLET | Freq: Four times a day (QID) | ORAL | 0 refills | Status: DC

## 2018-06-13 MED ORDER — SODIUM CHLORIDE 0.9 % IV SOLN
INTRAVENOUS | Status: DC | PRN
  Administered 2018-06-13: 500 mL via INTRAVENOUS

## 2018-06-13 MED ORDER — SUCRALFATE 1 GM/10ML PO SUSP: 1.0000 g | Freq: Three times a day (TID) | ORAL | 0 refills | Status: DC

## 2018-06-13 MED ORDER — ONDANSETRON 4 MG PO TBDP
4.0000 mg | ORAL_TABLET | Freq: Once | ORAL | Status: AC
  Administered 2018-06-13: 4 mg via ORAL
  Filled 2018-06-13: qty 1

## 2018-06-13 MED ORDER — OMEPRAZOLE 20 MG PO CPDR: 20.0000 mg | DELAYED_RELEASE_CAPSULE | Freq: Every day | ORAL | 1 refills | Status: DC

## 2018-06-13 MED ORDER — FAMOTIDINE IN NACL 20-0.9 MG/50ML-% IV SOLN
20.0000 mg | Freq: Once | INTRAVENOUS | Status: AC
  Administered 2018-06-13: 20 mg via INTRAVENOUS
  Filled 2018-06-13: qty 50

## 2018-06-13 MED ORDER — METOCLOPRAMIDE HCL 5 MG/ML IJ SOLN
10.0000 mg | Freq: Once | INTRAMUSCULAR | Status: AC
  Administered 2018-06-13: 10 mg via INTRAVENOUS
  Filled 2018-06-13: qty 2

## 2018-06-13 NOTE — ED Provider Notes (Signed)
[facility] Provider Note   CSN: [id number] Arrival date & time: 06/13/18  1558     History   Chief Complaint Chief Complaint  Patient presents with  . Emesis    HPI Shirley [name] is a 42 y.o. female with history of diabetes, fibromyalgia, gastroenteritis, hypertension who presents with 1 day history of emesis.  Patient reports she has vomited twice a day.  She reports she vomited this morning when she was riding to work.  She reports she had an apparent abdominal pain prior, however it resolved afterward.  She denies diarrhea or bloody stools, urinary symptoms, abnormal vaginal bleeding or discharge.  She denies abdominal pain her last visit, which she was evaluated for gallstones.  Right upper quadrant ultrasound was negative for acute cholecystitis or cholelithiasis.  No medications taken prior to arrival.  HPI  Past Medical History:  Diagnosis Date  . ADHD (attention deficit hyperactivity disorder)   . ALLERGIC RHINITIS 06/26/2008  . ANEMIA-IRON DEFICIENCY 06/26/2008  . 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  . ANKLE PAIN, LEFT 06/26/2008  . BACK PAIN, LUMBAR 10/15/2007  . BIPOLAR DISORDER UNSPECIFIED 09/13/2007  . CERVICAL RADICULOPATHY, LEFT 06/26/2008  . Cervicalgia 10/15/2007  . Chronic pain syndrome 03/21/2011  . COMMON MIGRAINE 06/26/2008  . DIABETES MELLITUS, TYPE II 06/05/2007  . DYSPNEA 12/05/2007  . EAR PAIN, RIGHT 12/05/2007  . ELEVATED BP READING WITHOUT DX HYPERTENSION 07/29/2009  . FATIGUE 12/05/2007  . FIBROMYALGIA 06/26/2008  . GASTROENTERITIS, ACUTE 12/15/2008  . GERD 06/26/2008  . Headache(784.0) 09/13/2007  . HYPERLIPIDEMIA 09/13/2007  . JOINT EFFUSION, RIGHT KNEE 07/29/2009  . KNEE PAIN, LEFT 11/21/2010  . LUMBAR RADICULOPATHY, LEFT 06/26/2008  . OTHER DISEASE OF PHARYNX OR NASOPHARYNX 07/03/2008  . PERIPHERAL EDEMA 01/06/2009  . POLYARTHRITIS 11/21/2010  . SHOULDER PAIN, BILATERAL 07/03/2008  . SINUSITIS- ACUTE-NOS 07/29/2009  . TACHYCARDIA 12/05/2007    . THYROID NODULE, RIGHT 11/20/2008  . TREMOR 01/02/2008  . Vertigo     Patient Active Problem List   Diagnosis Date Noted  . Right foot injury, initial encounter 02/08/2018  . Bilateral knee pain 01/14/2018  . MRSA (methicillin resistant staph aureus) urine culture positive on 07/06/16 07/06/2016  . Foot injury 12/20/2015  . Thoracic back pain 12/20/2015  . Low back pain radiating to right leg 12/01/2015  . Left shoulder pain 11/07/2013  . Left flank pain 11/07/2013  . Encounter for long-term (current) use of high-risk medication 05/29/2011  . Preventative health care 05/28/2011  . Chronic pain syndrome 03/21/2011  . KNEE PAIN, LEFT 11/21/2010  . POLYARTHRITIS 11/21/2010  . BACK PAIN 11/22/2009  . JOINT EFFUSION, RIGHT KNEE 07/29/2009  . ELEVATED BP READING WITHOUT DX HYPERTENSION 07/29/2009  . 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  . PERIPHERAL EDEMA 01/06/2009  . GASTROENTERITIS, ACUTE 12/15/2008  . DYSPHAGIA UNSPECIFIED 11/23/2008  . THYROID NODULE, RIGHT 11/20/2008  . SHOULDER PAIN, BILATERAL 07/03/2008  . ANEMIA-IRON DEFICIENCY 06/26/2008  . COMMON MIGRAINE 06/26/2008  . ALLERGIC RHINITIS 06/26/2008  . GERD 06/26/2008  . ANKLE PAIN, LEFT 06/26/2008  . CERVICAL RADICULOPATHY, LEFT 06/26/2008  . LUMBAR RADICULOPATHY, LEFT 06/26/2008  . FIBROMYALGIA 06/26/2008  . TREMOR 01/02/2008  . EAR PAIN, RIGHT 12/05/2007  . FATIGUE 12/05/2007  . TACHYCARDIA 12/05/2007  . DYSPNEA 12/05/2007  . Neck pain 10/15/2007  . BACK PAIN, LUMBAR 10/15/2007  . Pain in Soft Tissues of Limb 10/15/2007  . Hyperlipidemia 09/13/2007  . BIPOLAR DISORDER UNSPECIFIED 09/13/2007  . Headache(784.0) 09/13/2007  . Diabetes ([place]) 06/05/2007    Past Surgical History:  Procedure Laterality Date  . back surgury     lumbar 2001  . CESAREAN SECTION     x 3  . thyroid fine needle aspiration  May 2008   showed non neoplastic goiter  . VAGINAL HYSTERECTOMY     fibroids     OB History    Gravida  3   Para  3    Term  2   Preterm  1   AB      Living  3     SAB      TAB      Ectopic      Multiple      Live Births  3            Home Medications    Prior to Admission medications   Medication Sig Start Date End Date Taking? Authorizing Provider  calcium-vitamin D (OSCAL WITH D) 500-200 MG-UNIT tablet Take 1 tablet by mouth.    [provider]  cetirizine (ZYRTEC) 10 MG tablet Take 1 tablet (10 mg total) by mouth daily. 03/08/18   Ward, Ozella Almond, PA-C  dicyclomine (BENTYL) 20 MG tablet Take 1 tablet (20 mg total) by mouth 2 (two) times daily. 06/05/18   Maudie Flakes, MD  LO LOESTRIN FE 1 MG-10 MCG / 10 MCG tablet Take 1 tablet by mouth daily. 02/20/18   Truett Mainland, DO  metFORMIN (GLUCOPHAGE) 500 MG tablet Take 1 tablet (500 mg total) by mouth 2 (two) times daily with a meal. 01/11/18   Truett Mainland, DO  metoCLOPramide (REGLAN) 10 MG tablet Take 1 tablet (10 mg total) by mouth every 6 (six) hours. 06/13/18   Frederica Kuster, PA-C  Multiple Vitamins-Minerals (MULTIVITAMIN WITH MINERALS) tablet Take 1 tablet by mouth daily.    [provider]  omeprazole (PRILOSEC) 20 MG capsule Take 1 capsule (20 mg total) by mouth daily. 06/13/18   Frederica Kuster, PA-C  Probiotic Product (PROBIOTIC PO) Take 1 tablet by mouth as needed.     [provider]  ranitidine (ZANTAC) 150 MG capsule Take 1 capsule (150 mg total) by mouth daily. 06/13/18   Eitan Doubleday, Bea Graff, PA-C  sucralfate (CARAFATE) 1 GM/10ML suspension Take 10 mLs (1 g total) by mouth 4 (four) times daily -  with meals and at bedtime. 06/13/18   Fina Heizer, Bea Graff, PA-C  tiZANidine (ZANAFLEX) 4 MG tablet Take 1 tablet (4 mg total) by mouth every 6 (six) hours as needed for muscle spasms. 01/22/18   Dene Gentry, MD    Family History Family History  Problem Relation Age of Onset  . Thyroid disease Mother   . Diabetes Mother   . Asthma Mother   . Hypertension Father   . Hyperlipidemia Father   .  Diabetes Father   . Emphysema Other   . Coronary artery disease Other   . Cancer Other        pancreatic and breast cancer  . Cancer Other        lung and prostate cancer  . Breast cancer Neg Hx     Social History Social History   Tobacco Use  . Smoking status: Never Smoker  . Smokeless tobacco: Never Used  Substance Use Topics  . Alcohol use: No  . Drug use: No     Allergies   Sulfa antibiotics; Contrast media [iodinated diagnostic agents]; and Promethazine hcl   Review of Systems Review of Systems  Constitutional: Negative for chills and fever.  HENT: Negative for facial swelling and sore throat.   Respiratory: Negative for shortness of breath.   Cardiovascular: Negative for chest pain.  Gastrointestinal: Positive for abdominal pain (resolved), nausea and vomiting. Negative for blood in stool and diarrhea.  Genitourinary: Negative for dysuria.  Musculoskeletal: Negative for back pain.  Skin: Negative for rash and wound.  Neurological: Negative for headaches.  Psychiatric/Behavioral: The patient is not nervous/anxious.      Physical Exam Updated Vital Signs BP 119/85 (BP Location: Right Arm)   Pulse 99   Temp 98.4 F (36.9 C) (Oral)   Resp 16   Ht 5\' 4"  (1.626 m)   Wt 87 kg   SpO2 100%   BMI 32.92 kg/m   Physical Exam  Constitutional: She appears well-developed and well-nourished. No distress.  HENT:  Head: Normocephalic and atraumatic.  Mouth/Throat: Oropharynx is clear and moist. No oropharyngeal exudate.  Eyes: Pupils are equal, round, and reactive to light. Conjunctivae are normal. Right eye exhibits no discharge. Left eye exhibits no discharge. No scleral icterus.  Neck: Normal range of motion. Neck supple. No thyromegaly present.  Cardiovascular: Normal rate, regular rhythm, normal heart sounds and intact distal pulses. Exam reveals no gallop and no friction rub.  No murmur heard. Pulmonary/Chest: Effort normal and breath sounds normal. No  stridor. No respiratory distress. She has no wheezes. She has no rales.  Abdominal: Soft. Bowel sounds are normal. She exhibits no distension. There is tenderness (mild) in the epigastric area. There is no rebound, no guarding and negative Murphy's sign.  Musculoskeletal: She exhibits no edema.  Lymphadenopathy:    She has no cervical adenopathy.  Neurological: She is alert. Coordination normal.  Skin: Skin is warm and dry. No rash noted. She is not diaphoretic. No pallor.  Psychiatric: She has a normal mood and affect.  Nursing note and vitals reviewed.    ED Treatments / Results  Labs (all labs ordered are listed, but only abnormal results are displayed) Labs Reviewed  COMPREHENSIVE METABOLIC PANEL - Abnormal; Notable for the following components:      Result Value   Glucose, Bld 166 (*)    Total Protein 8.2 (*)    All other components within normal limits  URINALYSIS, ROUTINE W REFLEX MICROSCOPIC - Abnormal; Notable for the following components:   Ketones, ur 15 (*)    All other components within normal limits  CBC WITH DIFFERENTIAL/PLATELET  LIPASE, BLOOD    EKG None  Radiology No results found.  Procedures Procedures (including critical care time)  Medications Ordered in ED Medications  sucralfate (CARAFATE) 1 GM/10ML suspension 1 g ( Oral Canceled Entry 06/13/18 2202)  0.9 %  sodium chloride infusion ( Intravenous Stopped 06/13/18 2156)  ondansetron (ZOFRAN-ODT) disintegrating tablet 4 mg (4 mg Oral Given 06/13/18 1753)  famotidine (PEPCID) IVPB 20 mg premix ( Intravenous Stopped 06/13/18 2010)  metoCLOPramide (REGLAN) injection 10 mg (10 mg Intravenous Given 06/13/18 2057)     Initial Impression / Assessment and Plan / ED Course  I have reviewed the triage vital signs and the nursing notes.  Pertinent labs & imaging results that were available during my care of the patient were reviewed by me and considered in my medical decision making (see chart for details).      Patient with 2 episodes of emesis.  Labs are unremarkable.  Mild epigastric tenderness today.  Her vitals are stable.  No indication for repeat ultrasound as recent ultrasound showed no cholecystitis or cholelithiasis.  Suspect patient has  gastritis versus ulcer.  Will refer to GI for further evaluation.  Patient tolerating oral fluids in the ED after Zofran and Reglan as well as Carafate and Pepcid.  We will continue patient's previously prescribed Zantac and Prilosec and add Carafate and Reglan.  Return precautions discussed.  Patient understands and agrees with plan.  Patient vitals stable throughout ED course and discharged in satisfactory condition.  Final Clinical Impressions(s) / ED Diagnoses   Final diagnoses:  Non-intractable vomiting with nausea, unspecified vomiting type    ED Discharge Orders         Ordered    sucralfate (CARAFATE) 1 GM/10ML suspension  3 times daily with meals & bedtime     06/13/18 2150    metoCLOPramide (REGLAN) 10 MG tablet  Every 6 hours     06/13/18 2150    omeprazole (PRILOSEC) 20 MG capsule  Daily     06/13/18 2150    ranitidine (ZANTAC) 150 MG capsule  Daily     06/13/18 2150           Frederica Kuster, PA-C 06/13/18 [place], [place], DO 06/13/18 2340

## 2018-06-13 NOTE — Discharge Instructions (Signed)
Continue taking Zantac and Prilosec as prescribed.  Take Reglan every 6 hours as needed for nausea or vomiting.  Take Carafate before eating and before bed.  Please follow-up with gastroenterology for further evaluation and treatment of your symptoms.  Please return the emergency department if you develop any new or worsening symptoms including severe, worsening abdominal pain, intractable vomiting despite medication, fever over 100.4, or any other concerning symptom.

## 2018-06-13 NOTE — ED Triage Notes (Addendum)
Reports vomiting which began today.  States this is yellow in color.  Denies diarrhea and abdominal pain.

## 2018-06-13 NOTE — ED Notes (Signed)
Patient requesting for discharge; states she is feeling claustrophobic; explained to patient the purpose of her extending stay time was due to ensuring that she improves and can tolerate PO fluids; gave patient ginger ale for PO fluid challenge. NAD noted.

## 2018-06-14 DIAGNOSIS — Z13220 Encounter for screening for lipoid disorders: Secondary | ICD-10-CM | POA: Diagnosis not present

## 2018-06-14 DIAGNOSIS — E669 Obesity, unspecified: Secondary | ICD-10-CM | POA: Diagnosis not present

## 2018-06-14 DIAGNOSIS — R030 Elevated blood-pressure reading, without diagnosis of hypertension: Secondary | ICD-10-CM | POA: Diagnosis not present

## 2018-06-14 DIAGNOSIS — R1011 Right upper quadrant pain: Secondary | ICD-10-CM | POA: Diagnosis not present

## 2018-06-14 DIAGNOSIS — R1110 Vomiting, unspecified: Secondary | ICD-10-CM | POA: Diagnosis not present

## 2018-06-14 DIAGNOSIS — R109 Unspecified abdominal pain: Secondary | ICD-10-CM | POA: Diagnosis not present

## 2018-06-14 DIAGNOSIS — E1165 Type 2 diabetes mellitus with hyperglycemia: Secondary | ICD-10-CM | POA: Diagnosis not present

## 2018-06-17 ENCOUNTER — Other Ambulatory Visit: Payer: Self-pay

## 2018-06-17 ENCOUNTER — Emergency Department (HOSPITAL_BASED_OUTPATIENT_CLINIC_OR_DEPARTMENT_OTHER)
Admission: EM | Admit: 2018-06-17 | Discharge: 2018-06-17 | Disposition: A | Discharge status: Home / Self Care | Payer: Medicaid Other | Attending: Emergency Medicine | Admitting: Emergency Medicine

## 2018-06-17 ENCOUNTER — Encounter (HOSPITAL_BASED_OUTPATIENT_CLINIC_OR_DEPARTMENT_OTHER): Payer: Self-pay | Admitting: *Deleted

## 2018-06-17 DIAGNOSIS — Z760 Encounter for issue of repeat prescription: Secondary | ICD-10-CM | POA: Diagnosis not present

## 2018-06-17 DIAGNOSIS — T450X5A Adverse effect of antiallergic and antiemetic drugs, initial encounter: Secondary | ICD-10-CM | POA: Diagnosis not present

## 2018-06-17 DIAGNOSIS — IMO0001 Reserved for inherently not codable concepts without codable children: Secondary | ICD-10-CM

## 2018-06-17 DIAGNOSIS — R112 Nausea with vomiting, unspecified: Secondary | ICD-10-CM | POA: Diagnosis not present

## 2018-06-17 MED ORDER — ONDANSETRON 4 MG PO TBDP: 4.0000 mg | ORAL_TABLET | Freq: Three times a day (TID) | ORAL | 0 refills | Status: DC | PRN

## 2018-06-17 NOTE — ED Notes (Signed)
ED Provider at bedside. 

## 2018-06-17 NOTE — ED Provider Notes (Signed)
[facility] Provider Note   CSN: [id number] Arrival date & time: 06/17/18  1046     History   Chief Complaint No chief complaint on file.   HPI Shirley [name] is a 42 y.o. female with a history of angioedema, chronic pain syndrome, and diabetes mellitus type 2 who presents to the emergency department with a chief complaint of adverse reaction to medication.  Patient reports that she was seen in the emergency department on 06/13/2018 for emesis and was given IV Reglan during her visit.  She states that after she was given the medication the became she became extremely agitated and anxious.  She reports that she was also given Zofran IV which she tolerated well.  She reports that when she picked up her medications from the pharmacy that she noticed that she was given oral Reglan to take at home if she developed nausea and vomiting.  She reports that she has not taken it yet, but is concerned because she does not have any other antiemetics at home and has to follow up with her primary care provider first before she can get established with gastroenterology.  She has no complaints at this time including nausea, vomiting, diarrhea, fever, or chills.    The history is provided by the patient. No language interpreter was used.    Past Medical History:  Diagnosis Date  . ADHD (attention deficit hyperactivity disorder)   . ALLERGIC RHINITIS 06/26/2008  . ANEMIA-IRON DEFICIENCY 06/26/2008  . 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  . ANKLE PAIN, LEFT 06/26/2008  . BACK PAIN, LUMBAR 10/15/2007  . BIPOLAR DISORDER UNSPECIFIED 09/13/2007  . CERVICAL RADICULOPATHY, LEFT 06/26/2008  . Cervicalgia 10/15/2007  . Chronic pain syndrome 03/21/2011  . COMMON MIGRAINE 06/26/2008  . DIABETES MELLITUS, TYPE II 06/05/2007  . DYSPNEA 12/05/2007  . EAR PAIN, RIGHT 12/05/2007  . ELEVATED BP READING WITHOUT DX HYPERTENSION 07/29/2009  . FATIGUE 12/05/2007  . FIBROMYALGIA 06/26/2008  . GASTROENTERITIS,  ACUTE 12/15/2008  . GERD 06/26/2008  . Headache(784.0) 09/13/2007  . HYPERLIPIDEMIA 09/13/2007  . JOINT EFFUSION, RIGHT KNEE 07/29/2009  . KNEE PAIN, LEFT 11/21/2010  . LUMBAR RADICULOPATHY, LEFT 06/26/2008  . OTHER DISEASE OF PHARYNX OR NASOPHARYNX 07/03/2008  . PERIPHERAL EDEMA 01/06/2009  . POLYARTHRITIS 11/21/2010  . SHOULDER PAIN, BILATERAL 07/03/2008  . SINUSITIS- ACUTE-NOS 07/29/2009  . TACHYCARDIA 12/05/2007  . THYROID NODULE, RIGHT 11/20/2008  . TREMOR 01/02/2008  . Vertigo     Patient Active Problem List   Diagnosis Date Noted  . Right foot injury, initial encounter 02/08/2018  . Bilateral knee pain 01/14/2018  . MRSA (methicillin resistant staph aureus) urine culture positive on 07/06/16 07/06/2016  . Foot injury 12/20/2015  . Thoracic back pain 12/20/2015  . Low back pain radiating to right leg 12/01/2015  . Left shoulder pain 11/07/2013  . Left flank pain 11/07/2013  . Encounter for long-term (current) use of high-risk medication 05/29/2011  . Preventative health care 05/28/2011  . Chronic pain syndrome 03/21/2011  . KNEE PAIN, LEFT 11/21/2010  . POLYARTHRITIS 11/21/2010  . BACK PAIN 11/22/2009  . JOINT EFFUSION, RIGHT KNEE 07/29/2009  . ELEVATED BP READING WITHOUT DX HYPERTENSION 07/29/2009  . 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  . PERIPHERAL EDEMA 01/06/2009  . GASTROENTERITIS, ACUTE 12/15/2008  . DYSPHAGIA UNSPECIFIED 11/23/2008  . THYROID NODULE, RIGHT 11/20/2008  . SHOULDER PAIN, BILATERAL 07/03/2008  . ANEMIA-IRON DEFICIENCY 06/26/2008  . COMMON MIGRAINE 06/26/2008  . ALLERGIC RHINITIS 06/26/2008  . GERD 06/26/2008  . ANKLE PAIN, LEFT 06/26/2008  .  CERVICAL RADICULOPATHY, LEFT 06/26/2008  . LUMBAR RADICULOPATHY, LEFT 06/26/2008  . FIBROMYALGIA 06/26/2008  . TREMOR 01/02/2008  . EAR PAIN, RIGHT 12/05/2007  . FATIGUE 12/05/2007  . TACHYCARDIA 12/05/2007  . DYSPNEA 12/05/2007  . Neck pain 10/15/2007  . BACK PAIN, LUMBAR 10/15/2007  . Pain in Soft Tissues of Limb 10/15/2007    . Hyperlipidemia 09/13/2007  . BIPOLAR DISORDER UNSPECIFIED 09/13/2007  . Headache(784.0) 09/13/2007  . Diabetes ([place]) 06/05/2007    Past Surgical History:  Procedure Laterality Date  . back surgury     lumbar 2001  . CESAREAN SECTION     x 3  . thyroid fine needle aspiration  May 2008   showed non neoplastic goiter  . VAGINAL HYSTERECTOMY     fibroids     OB History    Gravida  3   Para  3   Term  2   Preterm  1   AB      Living  3     SAB      TAB      Ectopic      Multiple      Live Births  3            Home Medications    Prior to Admission medications   Medication Sig Start Date End Date Taking? Authorizing Provider  calcium-vitamin D (OSCAL WITH D) 500-200 MG-UNIT tablet Take 1 tablet by mouth.    [provider]  cetirizine (ZYRTEC) 10 MG tablet Take 1 tablet (10 mg total) by mouth daily. 03/08/18   Ward, Ozella Almond, PA-C  dicyclomine (BENTYL) 20 MG tablet Take 1 tablet (20 mg total) by mouth 2 (two) times daily. 06/05/18   Maudie Flakes, MD  LO LOESTRIN FE 1 MG-10 MCG / 10 MCG tablet Take 1 tablet by mouth daily. 02/20/18   Truett Mainland, DO  metFORMIN (GLUCOPHAGE) 500 MG tablet Take 1 tablet (500 mg total) by mouth 2 (two) times daily with a meal. 01/11/18   Truett Mainland, DO  metoCLOPramide (REGLAN) 10 MG tablet Take 1 tablet (10 mg total) by mouth every 6 (six) hours. 06/13/18   Frederica Kuster, PA-C  Multiple Vitamins-Minerals (MULTIVITAMIN WITH MINERALS) tablet Take 1 tablet by mouth daily.    [provider]  omeprazole (PRILOSEC) 20 MG capsule Take 1 capsule (20 mg total) by mouth daily. 06/13/18   Law, Bea Graff, PA-C  ondansetron (ZOFRAN ODT) 4 MG disintegrating tablet Take 1 tablet (4 mg total) by mouth every 8 (eight) hours as needed for nausea or vomiting. 06/17/18   Alphonsa Brickle A, PA-C  Probiotic Product (PROBIOTIC PO) Take 1 tablet by mouth as needed.     [provider]  ranitidine (ZANTAC) 150  MG capsule Take 1 capsule (150 mg total) by mouth daily. 06/13/18   Law, Bea Graff, PA-C  sucralfate (CARAFATE) 1 GM/10ML suspension Take 10 mLs (1 g total) by mouth 4 (four) times daily -  with meals and at bedtime. 06/13/18   Law, Bea Graff, PA-C  tiZANidine (ZANAFLEX) 4 MG tablet Take 1 tablet (4 mg total) by mouth every 6 (six) hours as needed for muscle spasms. 01/22/18   Dene Gentry, MD    Family History Family History  Problem Relation Age of Onset  . Thyroid disease Mother   . Diabetes Mother   . Asthma Mother   . Hypertension Father   . Hyperlipidemia Father   . Diabetes Father   .  Emphysema Other   . Coronary artery disease Other   . Cancer Other        pancreatic and breast cancer  . Cancer Other        lung and prostate cancer  . Breast cancer Neg Hx     Social History Social History   Tobacco Use  . Smoking status: Never Smoker  . Smokeless tobacco: Never Used  Substance Use Topics  . Alcohol use: No  . Drug use: No     Allergies   Sulfa antibiotics; Contrast media [iodinated diagnostic agents]; Promethazine hcl; and Reglan [metoclopramide]   Review of Systems Review of Systems  Constitutional: Negative for activity change, chills and fever.  Respiratory: Negative for shortness of breath.   Cardiovascular: Negative for chest pain.  Gastrointestinal: Negative for abdominal pain, diarrhea, nausea and vomiting.  Genitourinary: Negative for dysuria.  Musculoskeletal: Negative for back pain.  Skin: Negative for rash.  Allergic/Immunologic: Negative for immunocompromised state.  Neurological: Negative for headaches.  Psychiatric/Behavioral: Negative for confusion.     Physical Exam Updated Vital Signs BP (!) 131/95 (BP Location: Right Arm)   Pulse (!) 103   Temp 98 F (36.7 C) (Oral)   Resp 18   Ht 5\' 4"  (1.626 m)   Wt 87.5 kg   SpO2 100%   BMI 33.13 kg/m   Physical Exam  Constitutional: No distress.  HENT:  Head: Normocephalic.   Eyes: Conjunctivae are normal.  Neck: Neck supple.  Cardiovascular: Normal rate, regular rhythm, normal heart sounds and intact distal pulses. Exam reveals no gallop and no friction rub.  No murmur heard. Pulmonary/Chest: Effort normal. No stridor. No respiratory distress. She has no wheezes. She has no rales. She exhibits no tenderness.  Abdominal: Soft. Bowel sounds are normal. She exhibits no distension and no mass. There is no tenderness. There is no rebound and no guarding. No hernia.  Neurological: She is alert.  Skin: Skin is warm. No rash noted.  Psychiatric: Her behavior is normal.  Nursing note and vitals reviewed.    ED Treatments / Results  Labs (all labs ordered are listed, but only abnormal results are displayed) Labs Reviewed - No data to display  EKG None  Radiology No results found.  Procedures Procedures (including critical care time)  Medications Ordered in ED Medications - No data to display   Initial Impression / Assessment and Plan / ED Course  I have reviewed the triage vital signs and the nursing notes.  Pertinent labs & imaging results that were available during my care of the patient were reviewed by me and considered in my medical decision making (see chart for details).     42 year old female with a history of angioedema, gastroenteritis, diabetes mellitus type 2 presenting to the ED with a chief complaint of adverse reaction to medication.  She was seen in the ED on 06/13/2018 and became extremely anxious and agitated after she was given IV Reglan.  After being discharged, she realized that the antiemetic that she was prescribed was oral Reglan.  She is concerned that she may need an antiemetic before she is able to follow-up with gastroenterology.  She reports no symptoms or adverse reactions to Zofran.  The patient was also extensively counseled on not taking antiemetics when she was not having related symptoms.  She acknowledges conversation all  questions were answered.  She has a follow-up today with her primary care to get a referral for gastroenterologist.  She has no other complaints at  this time and is hemodynamically stable and safe for discharge home with outpatient follow-up.  Final Clinical Impressions(s) / ED Diagnoses   Final diagnoses:  Adverse reaction to antiallergic and antiemetic drugs, initial encounter    ED Discharge Orders         Ordered    ondansetron (ZOFRAN ODT) 4 MG disintegrating tablet  Every 8 hours PRN     06/17/18 1306           Lateya Dauria A, PA-C 06/17/18 1311    Maudie Flakes, MD 06/17/18 1600

## 2018-06-17 NOTE — Discharge Instructions (Addendum)
Thank you for allowing me to care for you today in the Emergency Department.   Let 1 tablet of Zofran dissolve under your tongue every 8 hours as needed for nausea or vomiting.  You should only use this medication when you are having nausea and vomiting.  Otherwise, do not take it.  Keep your follow-up appointment with your primary care provider to get established with gastroenterology.  Return to the emergency department if you have new or worsening symptoms including uncontrollable vomiting despite taking Zofran, high fever, or uncontrollable abdominal pain.

## 2018-06-17 NOTE — ED Triage Notes (Signed)
Pt reports being seen last week for n/v, was given reglan and had a reaction to it "I felt like I was going crazy, I was very agitated and anxious." pt states she was rx reglan took one dose last night and had a reaction again with anxiety and agitation. Pt requests change of medication.

## 2018-06-23 ENCOUNTER — Ambulatory Visit (HOSPITAL_COMMUNITY)
Admission: EM | Admit: 2018-06-23 | Discharge: 2018-06-23 | Disposition: A | Discharge status: Home / Self Care | Payer: Medicaid Other | Attending: Family Medicine | Admitting: Family Medicine

## 2018-06-23 ENCOUNTER — Encounter (HOSPITAL_COMMUNITY): Payer: Self-pay | Admitting: Emergency Medicine

## 2018-06-23 DIAGNOSIS — H9202 Otalgia, left ear: Secondary | ICD-10-CM

## 2018-06-23 DIAGNOSIS — M273 Alveolitis of jaws: Secondary | ICD-10-CM

## 2018-06-23 MED ORDER — OXYCODONE-ACETAMINOPHEN 5-325 MG PO TABS: 2.0000 | ORAL_TABLET | ORAL | 0 refills | Status: DC | PRN

## 2018-06-23 NOTE — ED Notes (Signed)
Pt discharged by provider.

## 2018-06-23 NOTE — ED Triage Notes (Signed)
Pt c/o L ear pain since Sunday. Pt had four teeth pulled 1 week ago. Pt is taking penicillin from the dentist to prevent infection. Pt states the L side of her face hurts.

## 2018-06-23 NOTE — ED Provider Notes (Signed)
[facility]    CSN: [id number] Arrival date & time: 06/23/18  1015     History   Chief Complaint Chief Complaint  Patient presents with  . Facial Pain  . Otalgia    HPI Shirley [name] is a 42 y.o. female.   HPI\ Patient is here for pain on the left side of her face and jaw.  Pain in her left ear.  Headache on the left side of her face.  Sinus congestion and pain in the left sinuses cheek and behind her eye.  She is had some runny and stuffy nose.  She has a terrible taste in her mouth.  She had multiple dental extractions performed several days ago.  She is using a Peridex rinse.  She is on penicillin 500 mg 4 times a day.  History of allergies.  She suspects that she has "sinus" problems.  No sore throat.  No fever chills.  Past Medical History:  Diagnosis Date  . ADHD (attention deficit hyperactivity disorder)   . ALLERGIC RHINITIS 06/26/2008  . ANEMIA-IRON DEFICIENCY 06/26/2008  . 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  . ANKLE PAIN, LEFT 06/26/2008  . BACK PAIN, LUMBAR 10/15/2007  . BIPOLAR DISORDER UNSPECIFIED 09/13/2007  . CERVICAL RADICULOPATHY, LEFT 06/26/2008  . Cervicalgia 10/15/2007  . Chronic pain syndrome 03/21/2011  . COMMON MIGRAINE 06/26/2008  . DIABETES MELLITUS, TYPE II 06/05/2007  . DYSPNEA 12/05/2007  . EAR PAIN, RIGHT 12/05/2007  . ELEVATED BP READING WITHOUT DX HYPERTENSION 07/29/2009  . FATIGUE 12/05/2007  . FIBROMYALGIA 06/26/2008  . GASTROENTERITIS, ACUTE 12/15/2008  . GERD 06/26/2008  . Headache(784.0) 09/13/2007  . HYPERLIPIDEMIA 09/13/2007  . JOINT EFFUSION, RIGHT KNEE 07/29/2009  . KNEE PAIN, LEFT 11/21/2010  . LUMBAR RADICULOPATHY, LEFT 06/26/2008  . OTHER DISEASE OF PHARYNX OR NASOPHARYNX 07/03/2008  . PERIPHERAL EDEMA 01/06/2009  . POLYARTHRITIS 11/21/2010  . SHOULDER PAIN, BILATERAL 07/03/2008  . SINUSITIS- ACUTE-NOS 07/29/2009  . TACHYCARDIA 12/05/2007  . THYROID NODULE, RIGHT 11/20/2008  . TREMOR 01/02/2008  . Vertigo     Patient Active Problem List   Diagnosis Date Noted  . Right foot injury, initial encounter 02/08/2018  . Bilateral knee pain 01/14/2018  . MRSA (methicillin resistant staph aureus) urine culture positive on 07/06/16 07/06/2016  . Foot injury 12/20/2015  . Thoracic back pain 12/20/2015  . Low back pain radiating to right leg 12/01/2015  . Left shoulder pain 11/07/2013  . Left flank pain 11/07/2013  . Encounter for long-term (current) use of high-risk medication 05/29/2011  . Preventative health care 05/28/2011  . Chronic pain syndrome 03/21/2011  . KNEE PAIN, LEFT 11/21/2010  . POLYARTHRITIS 11/21/2010  . BACK PAIN 11/22/2009  . JOINT EFFUSION, RIGHT KNEE 07/29/2009  . ELEVATED BP READING WITHOUT DX HYPERTENSION 07/29/2009  . 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  . PERIPHERAL EDEMA 01/06/2009  . GASTROENTERITIS, ACUTE 12/15/2008  . DYSPHAGIA UNSPECIFIED 11/23/2008  . THYROID NODULE, RIGHT 11/20/2008  . SHOULDER PAIN, BILATERAL 07/03/2008  . ANEMIA-IRON DEFICIENCY 06/26/2008  . COMMON MIGRAINE 06/26/2008  . ALLERGIC RHINITIS 06/26/2008  . GERD 06/26/2008  . ANKLE PAIN, LEFT 06/26/2008  . CERVICAL RADICULOPATHY, LEFT 06/26/2008  . LUMBAR RADICULOPATHY, LEFT 06/26/2008  . FIBROMYALGIA 06/26/2008  . TREMOR 01/02/2008  . EAR PAIN, RIGHT 12/05/2007  . FATIGUE 12/05/2007  . TACHYCARDIA 12/05/2007  . DYSPNEA 12/05/2007  . Neck pain 10/15/2007  . BACK PAIN, LUMBAR 10/15/2007  . Pain in Soft Tissues of Limb 10/15/2007  . Hyperlipidemia 09/13/2007  . BIPOLAR DISORDER UNSPECIFIED 09/13/2007  . Headache(784.0) 09/13/2007  .  Diabetes ([place]) 06/05/2007    Past Surgical History:  Procedure Laterality Date  . back surgury     lumbar 2001  . CESAREAN SECTION     x 3  . thyroid fine needle aspiration  May 2008   showed non neoplastic goiter  . VAGINAL HYSTERECTOMY     fibroids    OB History    Gravida  3   Para  3   Term  2   Preterm  1   AB      Living  3     SAB      TAB      Ectopic      Multiple       Live Births  3            Home Medications    Prior to Admission medications   Medication Sig Start Date End Date Taking? Authorizing Provider  Chlorhexidine Gluconate 2 % LIQD Apply topically.   Yes [provider]  penicillin v potassium (VEETID) 500 MG tablet Take 500 mg by mouth 4 (four) times daily.   Yes [provider]  calcium-vitamin D (OSCAL WITH D) 500-200 MG-UNIT tablet Take 1 tablet by mouth.    [provider]  cetirizine (ZYRTEC) 10 MG tablet Take 1 tablet (10 mg total) by mouth daily. 03/08/18   Ward, Ozella Almond, PA-C  dicyclomine (BENTYL) 20 MG tablet Take 1 tablet (20 mg total) by mouth 2 (two) times daily. 06/05/18   Maudie Flakes, MD  LO LOESTRIN FE 1 MG-10 MCG / 10 MCG tablet Take 1 tablet by mouth daily. 02/20/18   Truett Mainland, DO  metFORMIN (GLUCOPHAGE) 500 MG tablet Take 1 tablet (500 mg total) by mouth 2 (two) times daily with a meal. 01/11/18   Truett Mainland, DO  Multiple Vitamins-Minerals (MULTIVITAMIN WITH MINERALS) tablet Take 1 tablet by mouth daily.    [provider]  omeprazole (PRILOSEC) 20 MG capsule Take 1 capsule (20 mg total) by mouth daily. 06/13/18   Law, Bea Graff, PA-C  ondansetron (ZOFRAN ODT) 4 MG disintegrating tablet Take 1 tablet (4 mg total) by mouth every 8 (eight) hours as needed for nausea or vomiting. 06/17/18   McDonald, Mia A, PA-C  oxyCODONE-acetaminophen (PERCOCET/ROXICET) 5-325 MG tablet Take 2 tablets by mouth every 4 (four) hours as needed for severe pain. 06/23/18   Raylene Everts, MD  Probiotic Product (PROBIOTIC PO) Take 1 tablet by mouth as needed.     [provider]  ranitidine (ZANTAC) 150 MG capsule Take 1 capsule (150 mg total) by mouth daily. 06/13/18   Law, Bea Graff, PA-C  sucralfate (CARAFATE) 1 GM/10ML suspension Take 10 mLs (1 g total) by mouth 4 (four) times daily -  with meals and at bedtime. 06/13/18   Law, Bea Graff, PA-C  tiZANidine (ZANAFLEX) 4 MG tablet  Take 1 tablet (4 mg total) by mouth every 6 (six) hours as needed for muscle spasms. 01/22/18   Dene Gentry, MD    Family History Family History  Problem Relation Age of Onset  . Thyroid disease Mother   . Diabetes Mother   . Asthma Mother   . Hypertension Father   . Hyperlipidemia Father   . Diabetes Father   . Emphysema Other   . Coronary artery disease Other   . Cancer Other        pancreatic and breast cancer  . Cancer Other  lung and prostate cancer  . Breast cancer Neg Hx     Social History Social History   Tobacco Use  . Smoking status: Never Smoker  . Smokeless tobacco: Never Used  Substance Use Topics  . Alcohol use: No  . Drug use: No     Allergies   Sulfa antibiotics; Contrast media [iodinated diagnostic agents]; Promethazine hcl; and Reglan [metoclopramide]   Review of Systems Review of Systems  Constitutional: Negative for chills and fever.  HENT: Positive for dental problem, ear pain, facial swelling, sinus pressure and sinus pain. Negative for sore throat.   Eyes: Negative for pain and visual disturbance.  Respiratory: Negative for cough and shortness of breath.   Cardiovascular: Negative for chest pain and palpitations.  Gastrointestinal: Negative for abdominal pain and vomiting.  Genitourinary: Negative for dysuria and hematuria.  Musculoskeletal: Negative for arthralgias and back pain.  Skin: Negative for color change and rash.  Neurological: Negative for seizures and syncope.  All other systems reviewed and are negative.    Physical Exam Triage Vital Signs ED Triage Vitals [06/23/18 1041]  Enc Vitals Group     BP (!) 143/100     Pulse Rate 100     Resp 16     Temp 97.7 F (36.5 C)     Temp Source Oral     SpO2 100 %     Weight      Height      Head Circumference      Peak Flow      Pain Score      Pain Loc      Pain Edu?      Excl. in [place]?    No data found.  Updated Vital Signs BP (!) 143/100   Pulse 100   Temp  97.7 F (36.5 C) (Oral)   Resp 16   SpO2 100%      Physical Exam  Constitutional: She appears well-developed and well-nourished. She appears ill. No distress.  HENT:  Head: Normocephalic and atraumatic.  Right Ear: Tympanic membrane and external ear normal.  Left Ear: Tympanic membrane and external ear normal.  Mouth/Throat: Oropharynx is clear and moist.    Sinus tenderness left maxillary  Eyes: Pupils are equal, round, and reactive to light. Conjunctivae are normal.  Neck: Normal range of motion.  Cardiovascular: Normal rate.  Pulmonary/Chest: Effort normal. No respiratory distress.  Abdominal: Soft. She exhibits no distension.  Musculoskeletal: Normal range of motion. She exhibits no edema.  Lymphadenopathy:    She has cervical adenopathy.  Neurological: She is alert.  Skin: Skin is warm and dry.  Psychiatric: She has a normal mood and affect. Her behavior is normal.     UC Treatments / Results  Labs (all labs ordered are listed, but only abnormal results are displayed) Labs Reviewed - No data to display  EKG None  Radiology No results found.  Procedures Procedures (including critical care time)  Medications Ordered in UC Medications - No data to display  Initial Impression / Assessment and Plan / UC Course  I have reviewed the triage vital signs and the nursing notes.  Pertinent labs & imaging results that were available during my care of the patient were reviewed by me and considered in my medical decision making (see chart for details).     Discussed most of her symptoms from the dry socket.  Information about dry socket was found on the Internet and printed for her.  She needs to see a  dentist ASAP for treatment of this.  In the meantime I am going to increase her pain medicine.  Keep her on penicillin.  She can try over-the-counter pain relief with Anbesol or clove oil.  For sinus congestion she can use Flonase or a decongestant nasal spray. Final  Clinical Impressions(s) / UC Diagnoses   Final diagnoses:  Otalgia of left ear  Dry tooth socket     Discharge Instructions     You may try home remedies for the dry socket until you can see your dentist Warm salt water rinses and gargles may help I am prescribing a stronger pain medicine for when the pain is severe Continue your penicillin and chlorhexidine rinse Call your dentist Tuesday morning for an appointment For the sinus congestion get an counter nasal decongestant like phenylephrine (Afrin) to use for no more than 3 days   ED Prescriptions    Medication Sig Dispense Auth. Provider   oxyCODONE-acetaminophen (PERCOCET/ROXICET) 5-325 MG tablet Take 2 tablets by mouth every 4 (four) hours as needed for severe pain. 15 tablet Raylene Everts, MD     Controlled Substance Prescriptions [place] Controlled Substance Registry consulted? Yes, I have consulted the [place] Controlled Substances Registry for this patient, and feel the risk/benefit ratio today is favorable for proceeding with this prescription for a controlled substance.  Recent prescription for hydrocodone, for this incident, which patient states is not working.  No ongoing pain prescriptions   Raylene Everts, MD 06/23/18 2041

## 2018-06-23 NOTE — Discharge Instructions (Signed)
You may try home remedies for the dry socket until you can see your dentist Warm salt water rinses and gargles may help I am prescribing a stronger pain medicine for when the pain is severe Continue your penicillin and chlorhexidine rinse Call your dentist Tuesday morning for an appointment For the sinus congestion get an counter nasal decongestant like phenylephrine (Afrin) to use for no more than 3 days

## 2018-07-03 ENCOUNTER — Emergency Department (HOSPITAL_BASED_OUTPATIENT_CLINIC_OR_DEPARTMENT_OTHER)
Admission: EM | Admit: 2018-07-03 | Discharge: 2018-07-03 | Disposition: A | Discharge status: Home / Self Care | Payer: Medicaid Other | Attending: Emergency Medicine | Admitting: Emergency Medicine

## 2018-07-03 ENCOUNTER — Encounter (HOSPITAL_BASED_OUTPATIENT_CLINIC_OR_DEPARTMENT_OTHER): Payer: Self-pay

## 2018-07-03 ENCOUNTER — Other Ambulatory Visit: Payer: Self-pay

## 2018-07-03 DIAGNOSIS — R1011 Right upper quadrant pain: Secondary | ICD-10-CM | POA: Diagnosis not present

## 2018-07-03 DIAGNOSIS — E119 Type 2 diabetes mellitus without complications: Secondary | ICD-10-CM | POA: Diagnosis not present

## 2018-07-03 DIAGNOSIS — Z79899 Other long term (current) drug therapy: Secondary | ICD-10-CM | POA: Diagnosis not present

## 2018-07-03 DIAGNOSIS — M273 Alveolitis of jaws: Secondary | ICD-10-CM | POA: Insufficient documentation

## 2018-07-03 DIAGNOSIS — K0889 Other specified disorders of teeth and supporting structures: Secondary | ICD-10-CM | POA: Diagnosis present

## 2018-07-03 DIAGNOSIS — K760 Fatty (change of) liver, not elsewhere classified: Secondary | ICD-10-CM | POA: Diagnosis not present

## 2018-07-03 DIAGNOSIS — Z7984 Long term (current) use of oral hypoglycemic drugs: Secondary | ICD-10-CM | POA: Insufficient documentation

## 2018-07-03 DIAGNOSIS — K219 Gastro-esophageal reflux disease without esophagitis: Secondary | ICD-10-CM | POA: Diagnosis not present

## 2018-07-03 MED ORDER — CLINDAMYCIN HCL 150 MG PO CAPS
300.0000 mg | ORAL_CAPSULE | Freq: Once | ORAL | Status: AC
  Administered 2018-07-03: 300 mg via ORAL
  Filled 2018-07-03: qty 2

## 2018-07-03 MED ORDER — OXYCODONE-ACETAMINOPHEN 5-325 MG PO TABS: 1.0000 | ORAL_TABLET | ORAL | 0 refills | Status: DC | PRN

## 2018-07-03 MED ORDER — CLINDAMYCIN HCL 150 MG PO CAPS: 150.0000 mg | ORAL_CAPSULE | Freq: Four times a day (QID) | ORAL | 0 refills | Status: DC

## 2018-07-03 NOTE — ED Triage Notes (Signed)
Pt c/o dental pain from dry sockets from a dental procedure a few weeks ago, states the pain is still there and is not being relieved by the pain medications that have been prescribed

## 2018-07-03 NOTE — Discharge Instructions (Addendum)
Continue taking your naproxen twice a day.

## 2018-07-03 NOTE — ED Provider Notes (Signed)
[facility] EMERGENCY DEPARTMENT Provider Note   CSN: [id number] Arrival date & time: 07/03/18  0202     History   Chief Complaint Chief Complaint  Patient presents with  . Dental Pain    HPI Shirley [name] is a 41 y.o. female.  The history is provided by the patient.  She has history of attention deficit disorder, bipolar disorder, diabetes and comes in with ongoing dental pain following extraction.  On August 26, she had extractions of multiple third molars and a left upper molar.  She has been having problems with pain which have been diagnosis dry socket.  She was seen in urgent care and referred back to her dentist who referred her back to the oral surgeon.  She did have some gauze placed by the oral surgeon, but she is not sure if it is still in place.  She had received prescription for oxycodone-acetaminophen which did give her slight relief.  She was also given prescription for naproxen.  She notices cold air makes it worse.  It is painful to chew.  She denies fever or chills.  Past Medical History:  Diagnosis Date  . ADHD (attention deficit hyperactivity disorder)   . ALLERGIC RHINITIS 06/26/2008  . ANEMIA-IRON DEFICIENCY 06/26/2008  . 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  . ANKLE PAIN, LEFT 06/26/2008  . BACK PAIN, LUMBAR 10/15/2007  . BIPOLAR DISORDER UNSPECIFIED 09/13/2007  . CERVICAL RADICULOPATHY, LEFT 06/26/2008  . Cervicalgia 10/15/2007  . Chronic pain syndrome 03/21/2011  . COMMON MIGRAINE 06/26/2008  . DIABETES MELLITUS, TYPE II 06/05/2007  . DYSPNEA 12/05/2007  . EAR PAIN, RIGHT 12/05/2007  . ELEVATED BP READING WITHOUT DX HYPERTENSION 07/29/2009  . FATIGUE 12/05/2007  . FIBROMYALGIA 06/26/2008  . GASTROENTERITIS, ACUTE 12/15/2008  . GERD 06/26/2008  . Headache(784.0) 09/13/2007  . HYPERLIPIDEMIA 09/13/2007  . JOINT EFFUSION, RIGHT KNEE 07/29/2009  . KNEE PAIN, LEFT 11/21/2010  . LUMBAR RADICULOPATHY, LEFT 06/26/2008  . OTHER DISEASE OF PHARYNX OR NASOPHARYNX 07/03/2008  .  PERIPHERAL EDEMA 01/06/2009  . POLYARTHRITIS 11/21/2010  . SHOULDER PAIN, BILATERAL 07/03/2008  . SINUSITIS- ACUTE-NOS 07/29/2009  . TACHYCARDIA 12/05/2007  . THYROID NODULE, RIGHT 11/20/2008  . TREMOR 01/02/2008  . Vertigo     Patient Active Problem List   Diagnosis Date Noted  . Right foot injury, initial encounter 02/08/2018  . Bilateral knee pain 01/14/2018  . MRSA (methicillin resistant staph aureus) urine culture positive on 07/06/16 07/06/2016  . Foot injury 12/20/2015  . Thoracic back pain 12/20/2015  . Low back pain radiating to right leg 12/01/2015  . Left shoulder pain 11/07/2013  . Left flank pain 11/07/2013  . Encounter for long-term (current) use of high-risk medication 05/29/2011  . Preventative health care 05/28/2011  . Chronic pain syndrome 03/21/2011  . KNEE PAIN, LEFT 11/21/2010  . POLYARTHRITIS 11/21/2010  . BACK PAIN 11/22/2009  . JOINT EFFUSION, RIGHT KNEE 07/29/2009  . ELEVATED BP READING WITHOUT DX HYPERTENSION 07/29/2009  . 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  . PERIPHERAL EDEMA 01/06/2009  . GASTROENTERITIS, ACUTE 12/15/2008  . DYSPHAGIA UNSPECIFIED 11/23/2008  . THYROID NODULE, RIGHT 11/20/2008  . SHOULDER PAIN, BILATERAL 07/03/2008  . ANEMIA-IRON DEFICIENCY 06/26/2008  . COMMON MIGRAINE 06/26/2008  . ALLERGIC RHINITIS 06/26/2008  . GERD 06/26/2008  . ANKLE PAIN, LEFT 06/26/2008  . CERVICAL RADICULOPATHY, LEFT 06/26/2008  . LUMBAR RADICULOPATHY, LEFT 06/26/2008  . FIBROMYALGIA 06/26/2008  . TREMOR 01/02/2008  . EAR PAIN, RIGHT 12/05/2007  . FATIGUE 12/05/2007  . TACHYCARDIA 12/05/2007  . DYSPNEA 12/05/2007  . Neck  pain 10/15/2007  . BACK PAIN, LUMBAR 10/15/2007  . Pain in Soft Tissues of Limb 10/15/2007  . Hyperlipidemia 09/13/2007  . BIPOLAR DISORDER UNSPECIFIED 09/13/2007  . Headache(784.0) 09/13/2007  . Diabetes ([place]) 06/05/2007    Past Surgical History:  Procedure Laterality Date  . back surgury     lumbar 2001  . CESAREAN SECTION     x 3  .  thyroid fine needle aspiration  May 2008   showed non neoplastic goiter  . VAGINAL HYSTERECTOMY     fibroids     OB History    Gravida  3   Para  3   Term  2   Preterm  1   AB      Living  3     SAB      TAB      Ectopic      Multiple      Live Births  3            Home Medications    Prior to Admission medications   Medication Sig Start Date End Date Taking? Authorizing Provider  calcium-vitamin D (OSCAL WITH D) 500-200 MG-UNIT tablet Take 1 tablet by mouth.    [provider]  cetirizine (ZYRTEC) 10 MG tablet Take 1 tablet (10 mg total) by mouth daily. 03/08/18   Ward, Ozella Almond, PA-C  Chlorhexidine Gluconate 2 % LIQD Apply topically.    [provider]  clindamycin (CLEOCIN) 150 MG capsule Take 1 capsule (150 mg total) by mouth every 6 (six) hours. 02/24/38   Delora Fuel, MD  dicyclomine (BENTYL) 20 MG tablet Take 1 tablet (20 mg total) by mouth 2 (two) times daily. 06/05/18   Maudie Flakes, MD  LO LOESTRIN FE 1 MG-10 MCG / 10 MCG tablet Take 1 tablet by mouth daily. 02/20/18   Truett Mainland, DO  metFORMIN (GLUCOPHAGE) 500 MG tablet Take 1 tablet (500 mg total) by mouth 2 (two) times daily with a meal. 01/11/18   Truett Mainland, DO  Multiple Vitamins-Minerals (MULTIVITAMIN WITH MINERALS) tablet Take 1 tablet by mouth daily.    [provider]  omeprazole (PRILOSEC) 20 MG capsule Take 1 capsule (20 mg total) by mouth daily. 06/13/18   Law, Bea Graff, PA-C  ondansetron (ZOFRAN ODT) 4 MG disintegrating tablet Take 1 tablet (4 mg total) by mouth every 8 (eight) hours as needed for nausea or vomiting. 06/17/18   McDonald, Mia A, PA-C  oxyCODONE-acetaminophen (PERCOCET/ROXICET) 5-325 MG tablet Take 1-2 tablets by mouth every 4 (four) hours as needed for severe pain. 7/67/34   Delora Fuel, MD  penicillin v potassium (VEETID) 500 MG tablet Take 500 mg by mouth 4 (four) times daily.    [provider]  Probiotic Product  (PROBIOTIC PO) Take 1 tablet by mouth as needed.     [provider]  ranitidine (ZANTAC) 150 MG capsule Take 1 capsule (150 mg total) by mouth daily. 06/13/18   Law, Bea Graff, PA-C  sucralfate (CARAFATE) 1 GM/10ML suspension Take 10 mLs (1 g total) by mouth 4 (four) times daily -  with meals and at bedtime. 06/13/18   Law, Bea Graff, PA-C  tiZANidine (ZANAFLEX) 4 MG tablet Take 1 tablet (4 mg total) by mouth every 6 (six) hours as needed for muscle spasms. 01/22/18   Dene Gentry, MD    Family History Family History  Problem Relation Age of Onset  . Thyroid disease Mother   . Diabetes Mother   .  Asthma Mother   . Hypertension Father   . Hyperlipidemia Father   . Diabetes Father   . Emphysema Other   . Coronary artery disease Other   . Cancer Other        pancreatic and breast cancer  . Cancer Other        lung and prostate cancer  . Breast cancer Neg Hx     Social History Social History   Tobacco Use  . Smoking status: Never Smoker  . Smokeless tobacco: Never Used  Substance Use Topics  . Alcohol use: No  . Drug use: No     Allergies   Sulfa antibiotics; Contrast media [iodinated diagnostic agents]; Promethazine hcl; and Reglan [metoclopramide]   Review of Systems Review of Systems  All other systems reviewed and are negative.    Physical Exam Updated Vital Signs BP (!) 147/85 (BP Location: Right Arm)   Pulse 99   Temp 98 F (36.7 C) (Oral)   Resp 18   Ht 5\' 2"  (1.575 m)   Wt 87.5 kg   SpO2 100%   BMI 35.28 kg/m   Physical Exam  Nursing note and vitals reviewed.  42 year old female, resting comfortably and in no acute distress. Vital signs are significant for elevated systolic blood pressure. Oxygen saturation is 100%, which is normal. Head is normocephalic and atraumatic. PERRLA, EOMI. Oropharynx is clear.  Teeth #1, 14, 16, 17, 32 have been extracted.  There is tenderness to palpation around the sockets of teeth #1, 16, 17, 32.  No  significant erythema present, no significant swelling. Neck is nontender and supple without adenopathy or JVD. Back is nontender and there is no CVA tenderness. Lungs are clear without rales, wheezes, or rhonchi. Chest is nontender. Heart has regular rate and rhythm without murmur. Abdomen is soft, flat, nontender without masses or hepatosplenomegaly and peristalsis is normoactive. Extremities have no cyanosis or edema, full range of motion is present. Skin is warm and dry without rash. Neurologic: Mental status is normal, cranial nerves are intact, there are no motor or sensory deficits.  ED Treatments / Results   Procedures Procedures  Medications Ordered in ED Medications  clindamycin (CLEOCIN) capsule 300 mg (300 mg Oral Given 07/03/18 0348)     Initial Impression / Assessment and Plan / ED Course  I have reviewed the triage vital signs and the nursing notes.  Pain following dental extractions which likely secondary to dry socket.  She had been on a course of penicillin which had not been beneficial.  We will try her on clindamycin.  Given new prescription for oxycodone-acetaminophen.  Old records are reviewed, and she has had several short-term narcotic prescriptions related to this dental procedure, but no other narcotic prescriptions.  Recommended she follow-up with her oral surgeon.  Final Clinical Impressions(s) / ED Diagnoses   Final diagnoses:  Dry tooth socket    ED Discharge Orders         Ordered    oxyCODONE-acetaminophen (PERCOCET/ROXICET) 5-325 MG tablet  Every 4 hours PRN     07/03/18 0352    clindamycin (CLEOCIN) 150 MG capsule  Every 6 hours     07/03/18 1610           Delora Fuel, MD 96/04/54 [phone]

## 2018-07-15 ENCOUNTER — Ambulatory Visit: Payer: Medicaid Other | Admitting: Family Medicine

## 2018-07-15 ENCOUNTER — Encounter: Payer: Self-pay | Admitting: Family Medicine

## 2018-07-15 VITALS — BP 82/65 | HR 96 | Ht 64.0 in | Wt 193.0 lb

## 2018-07-15 DIAGNOSIS — M25561 Pain in right knee: Secondary | ICD-10-CM | POA: Diagnosis not present

## 2018-07-15 NOTE — Progress Notes (Addendum)
PCP: Eston Esters, NP  Subjective:   HPI: Patient is a 42 y.o. female here for right knee pain.  Patient reports anterior medial knee pain.  It is 7/10 and sharp.  Her pain started approximately 24 hours ago with no specific injury.  A few days prior she had been doing lower body exercises.  She reports some knee stiffness and pain with extension.  She also reports pain with weightbearing.  She denies any locking or giving out.  She is not taking any medications or used ice.  She denies any associated swelling, bruising, erythema.  No fevers or chills.  No numbness or tingling.  No skin changes..   Past Medical History:  Diagnosis Date  . ADHD (attention deficit hyperactivity disorder)   . ALLERGIC RHINITIS 06/26/2008  . ANEMIA-IRON DEFICIENCY 06/26/2008  . 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  . ANKLE PAIN, LEFT 06/26/2008  . BACK PAIN, LUMBAR 10/15/2007  . BIPOLAR DISORDER UNSPECIFIED 09/13/2007  . CERVICAL RADICULOPATHY, LEFT 06/26/2008  . Cervicalgia 10/15/2007  . Chronic pain syndrome 03/21/2011  . COMMON MIGRAINE 06/26/2008  . DIABETES MELLITUS, TYPE II 06/05/2007  . DYSPNEA 12/05/2007  . EAR PAIN, RIGHT 12/05/2007  . ELEVATED BP READING WITHOUT DX HYPERTENSION 07/29/2009  . FATIGUE 12/05/2007  . FIBROMYALGIA 06/26/2008  . GASTROENTERITIS, ACUTE 12/15/2008  . GERD 06/26/2008  . Headache(784.0) 09/13/2007  . HYPERLIPIDEMIA 09/13/2007  . JOINT EFFUSION, RIGHT KNEE 07/29/2009  . KNEE PAIN, LEFT 11/21/2010  . LUMBAR RADICULOPATHY, LEFT 06/26/2008  . OTHER DISEASE OF PHARYNX OR NASOPHARYNX 07/03/2008  . PERIPHERAL EDEMA 01/06/2009  . POLYARTHRITIS 11/21/2010  . SHOULDER PAIN, BILATERAL 07/03/2008  . SINUSITIS- ACUTE-NOS 07/29/2009  . TACHYCARDIA 12/05/2007  . THYROID NODULE, RIGHT 11/20/2008  . TREMOR 01/02/2008  . Vertigo     Current Outpatient Medications on File Prior to Visit  Medication Sig Dispense Refill  . calcium-vitamin D (OSCAL WITH D) 500-200 MG-UNIT tablet Take 1 tablet by mouth.    .  cetirizine (ZYRTEC) 10 MG tablet Take 1 tablet (10 mg total) by mouth daily. 30 tablet 0  . Chlorhexidine Gluconate 2 % LIQD Apply topically.    . clindamycin (CLEOCIN) 150 MG capsule Take 1 capsule (150 mg total) by mouth every 6 (six) hours. 28 capsule 0  . dicyclomine (BENTYL) 20 MG tablet Take 1 tablet (20 mg total) by mouth 2 (two) times daily. 20 tablet 0  . LO LOESTRIN FE 1 MG-10 MCG / 10 MCG tablet Take 1 tablet by mouth daily. 3 Package 3  . metFORMIN (GLUCOPHAGE) 500 MG tablet Take 1 tablet (500 mg total) by mouth 2 (two) times daily with a meal. 60 tablet 5  . Multiple Vitamins-Minerals (MULTIVITAMIN WITH MINERALS) tablet Take 1 tablet by mouth daily.    Marland Kitchen omeprazole (PRILOSEC) 20 MG capsule Take 1 capsule (20 mg total) by mouth daily. 30 capsule 1  . ondansetron (ZOFRAN ODT) 4 MG disintegrating tablet Take 1 tablet (4 mg total) by mouth every 8 (eight) hours as needed for nausea or vomiting. 20 tablet 0  . oxyCODONE-acetaminophen (PERCOCET/ROXICET) 5-325 MG tablet Take 1-2 tablets by mouth every 4 (four) hours as needed for severe pain. 20 tablet 0  . penicillin v potassium (VEETID) 500 MG tablet Take 500 mg by mouth 4 (four) times daily.    . Probiotic Product (PROBIOTIC PO) Take 1 tablet by mouth as needed.     . ranitidine (ZANTAC) 150 MG capsule Take 1 capsule (150 mg total) by mouth daily. 30 capsule 1  .  sucralfate (CARAFATE) 1 GM/10ML suspension Take 10 mLs (1 g total) by mouth 4 (four) times daily -  with meals and at bedtime. 420 mL 0  . tiZANidine (ZANAFLEX) 4 MG tablet Take 1 tablet (4 mg total) by mouth every 6 (six) hours as needed for muscle spasms. 60 tablet 1   No current facility-administered medications on file prior to visit.     Past Surgical History:  Procedure Laterality Date  . back surgury     lumbar 2001  . CESAREAN SECTION     x 3  . thyroid fine needle aspiration  May 2008   showed non neoplastic goiter  . VAGINAL HYSTERECTOMY     fibroids     Allergies  Allergen Reactions  . Sulfa Antibiotics Anaphylaxis  . Contrast Media [Iodinated Diagnostic Agents] Itching    Mri, severe heaving ~ can take benadryl without reaction  . Promethazine Hcl     Iv dose with benadryl causes severe aggrivation  . Reglan [Metoclopramide]     "It makes me crazy"    Social History   Socioeconomic History  . Marital status: Married    Spouse name: Not on file  . Number of children: 3  . Years of education: Not on file  . Highest education level: Not on file  Occupational History    Employer: UNEMPLOYED  Social Needs  . Financial resource strain: Not on file  . Food insecurity:    Worry: Not on file    Inability: Not on file  . Transportation needs:    Medical: Not on file    Non-medical: Not on file  Tobacco Use  . Smoking status: Never Smoker  . Smokeless tobacco: Never Used  Substance and Sexual Activity  . Alcohol use: No  . Drug use: No  . Sexual activity: Not on file  Lifestyle  . Physical activity:    Days per week: Not on file    Minutes per session: Not on file  . Stress: Not on file  Relationships  . Social connections:    Talks on phone: Not on file    Gets together: Not on file    Attends religious service: Not on file    Active member of club or organization: Not on file    Attends meetings of clubs or organizations: Not on file    Relationship status: Not on file  . Intimate partner violence:    Fear of current or ex partner: Not on file    Emotionally abused: Not on file    Physically abused: Not on file    Forced sexual activity: Not on file  Other Topics Concern  . Not on file  Social History Narrative  . Not on file    Family History  Problem Relation Age of Onset  . Thyroid disease Mother   . Diabetes Mother   . Asthma Mother   . Hypertension Father   . Hyperlipidemia Father   . Diabetes Father   . Emphysema Other   . Coronary artery disease Other   . Cancer Other        pancreatic and  breast cancer  . Cancer Other        lung and prostate cancer  . Breast cancer Neg Hx     BP (!) 82/65   Pulse 96   Ht 5\' 4"  (1.626 m)   Wt 193 lb (87.5 kg)   BMI 33.13 kg/m   Review of Systems: See HPI above.  Objective:  Physical Exam:  Gen: awake, alert, NAD, comfortable in exam room Pulm: breathing unlabored  Right knee: - Inspection: no gross deformity. No swelling/effusion, erythema or bruising. Skin intact - Palpation: Tenderness over the medial aspect of the patella.  Tenderness over the medial joint line. - ROM: full active ROM with flexion and extension in knee - Strength: 5/5 strength - Neuro/vasc: NV intact - Special Tests: - LIGAMENTS: negative anterior and posterior drawer, negative Lachman's, no MCL or LCL laxity  -- MENISCUS: negative McMurray -- PF JOINT: nml patellar mobility bilaterally. negative patellar apprehension.  Crepitus noted at the patella.  Left Knee: - Inspection: no gross deformity. No swelling/effusion, erythema or bruising. Skin intact - Palpation: no TTP - ROM: full active ROM with flexion and extension in knee - Strength: 5/5 strength - Neuro/vasc: NV intact  Hips: normal ROM with IR/ER without pain   Assessment & Plan:  1.  Right knee pain - likely secondary to arthritis flare.  Based on history and exam suspect arthritis flare.  No structural abnormalities of the knee.  As his pain has only been present for short time, I would expect rapid resolution of the pain. - Recommend NSAIDs.  Patient has Mobic 15 mg at home that she will take daily for 7 to 10 days then as needed - Ice - Activity as tolerated - Follow-up as needed  Patient seen and examined with fellow.  Agree with his note and findings.

## 2018-07-15 NOTE — Patient Instructions (Signed)
Your pain is due to a flare of arthritis. These are the different medications you can take for this: Tylenol 500mg  1-2 tabs three times a day for pain. Capsaicin, aspercreme, or biofreeze topically up to four times a day may also help with pain. Some supplements that may help for arthritis: Boswellia extract, curcumin, pycnogenol Meloxicam 15 mg daily with food - if this isn't working try your naproxen 500mg  twice a day with food instead. Cortisone injections are an option. It's important that you continue to stay active. Straight leg raises, knee extensions 3 sets of 10 once a day (add ankle weight if these become too easy). Consider physical therapy to strengthen muscles around the joint that hurts to take pressure off of the joint itself. Shoe inserts with good arch support may be helpful. Ice 15 minutes at a time 3-4 times a day as needed to help with pain. Follow up with me as needed for this.

## 2018-07-16 ENCOUNTER — Encounter (HOSPITAL_BASED_OUTPATIENT_CLINIC_OR_DEPARTMENT_OTHER): Payer: Self-pay

## 2018-07-16 ENCOUNTER — Emergency Department (HOSPITAL_BASED_OUTPATIENT_CLINIC_OR_DEPARTMENT_OTHER): Payer: Medicaid Other

## 2018-07-16 ENCOUNTER — Emergency Department (HOSPITAL_BASED_OUTPATIENT_CLINIC_OR_DEPARTMENT_OTHER)
Admission: EM | Admit: 2018-07-16 | Discharge: 2018-07-16 | Disposition: A | Discharge status: Home / Self Care | Payer: Medicaid Other | Attending: Emergency Medicine | Admitting: Emergency Medicine

## 2018-07-16 DIAGNOSIS — Z79899 Other long term (current) drug therapy: Secondary | ICD-10-CM | POA: Insufficient documentation

## 2018-07-16 DIAGNOSIS — Z7984 Long term (current) use of oral hypoglycemic drugs: Secondary | ICD-10-CM | POA: Diagnosis not present

## 2018-07-16 DIAGNOSIS — E119 Type 2 diabetes mellitus without complications: Secondary | ICD-10-CM | POA: Insufficient documentation

## 2018-07-16 DIAGNOSIS — M25561 Pain in right knee: Secondary | ICD-10-CM | POA: Diagnosis not present

## 2018-07-16 NOTE — Discharge Instructions (Addendum)
Rest, ibuprofen and tylenol as needed. Ice as needed  Call your sports medicine physician as needed for new or worsening symptoms

## 2018-07-16 NOTE — ED Provider Notes (Signed)
[facility] EMERGENCY DEPARTMENT Provider Note   CSN: [id number] Arrival date & time: 07/16/18  0749     History   Chief Complaint Chief Complaint  Patient presents with  . Knee Pain    HPI Shirley [name] is a 42 y.o. female.  HPI 42 year old female presents the emergency department with ongoing anterior knee pain over the past 4 days.  She states she did some light exercise through the weekend.  She was seen and evaluated by sports medicine for yesterday without imaging of her knee.  This was thought to be secondary to arthritis flare.  She reports ongoing discomfort and pain behind her patella on the right.  No direct injury or trauma.  No paresthesias in the right lower extremity.  No other complaints.  She has a history of fibromyalgia.  She has a history of recurrent joint pain.  She states the pain in her right knee is mild to moderate in severity and worse with extension at the level of the right knee   Past Medical History:  Diagnosis Date  . ADHD (attention deficit hyperactivity disorder)   . ALLERGIC RHINITIS 06/26/2008  . ANEMIA-IRON DEFICIENCY 06/26/2008  . 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  . ANKLE PAIN, LEFT 06/26/2008  . BACK PAIN, LUMBAR 10/15/2007  . BIPOLAR DISORDER UNSPECIFIED 09/13/2007  . CERVICAL RADICULOPATHY, LEFT 06/26/2008  . Cervicalgia 10/15/2007  . Chronic pain syndrome 03/21/2011  . COMMON MIGRAINE 06/26/2008  . DIABETES MELLITUS, TYPE II 06/05/2007  . DYSPNEA 12/05/2007  . EAR PAIN, RIGHT 12/05/2007  . ELEVATED BP READING WITHOUT DX HYPERTENSION 07/29/2009  . FATIGUE 12/05/2007  . FIBROMYALGIA 06/26/2008  . GASTROENTERITIS, ACUTE 12/15/2008  . GERD 06/26/2008  . Headache(784.0) 09/13/2007  . HYPERLIPIDEMIA 09/13/2007  . JOINT EFFUSION, RIGHT KNEE 07/29/2009  . KNEE PAIN, LEFT 11/21/2010  . LUMBAR RADICULOPATHY, LEFT 06/26/2008  . OTHER DISEASE OF PHARYNX OR NASOPHARYNX 07/03/2008  . PERIPHERAL EDEMA 01/06/2009  . POLYARTHRITIS 11/21/2010  . SHOULDER PAIN,  BILATERAL 07/03/2008  . SINUSITIS- ACUTE-NOS 07/29/2009  . TACHYCARDIA 12/05/2007  . THYROID NODULE, RIGHT 11/20/2008  . TREMOR 01/02/2008  . Vertigo     Patient Active Problem List   Diagnosis Date Noted  . Right foot injury, initial encounter 02/08/2018  . Bilateral knee pain 01/14/2018  . MRSA (methicillin resistant staph aureus) urine culture positive on 07/06/16 07/06/2016  . Foot injury 12/20/2015  . Thoracic back pain 12/20/2015  . Low back pain radiating to right leg 12/01/2015  . Left shoulder pain 11/07/2013  . Left flank pain 11/07/2013  . Encounter for long-term (current) use of high-risk medication 05/29/2011  . Preventative health care 05/28/2011  . Chronic pain syndrome 03/21/2011  . KNEE PAIN, LEFT 11/21/2010  . POLYARTHRITIS 11/21/2010  . BACK PAIN 11/22/2009  . JOINT EFFUSION, RIGHT KNEE 07/29/2009  . ELEVATED BP READING WITHOUT DX HYPERTENSION 07/29/2009  . 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  . PERIPHERAL EDEMA 01/06/2009  . GASTROENTERITIS, ACUTE 12/15/2008  . DYSPHAGIA UNSPECIFIED 11/23/2008  . THYROID NODULE, RIGHT 11/20/2008  . SHOULDER PAIN, BILATERAL 07/03/2008  . ANEMIA-IRON DEFICIENCY 06/26/2008  . COMMON MIGRAINE 06/26/2008  . ALLERGIC RHINITIS 06/26/2008  . GERD 06/26/2008  . ANKLE PAIN, LEFT 06/26/2008  . CERVICAL RADICULOPATHY, LEFT 06/26/2008  . LUMBAR RADICULOPATHY, LEFT 06/26/2008  . FIBROMYALGIA 06/26/2008  . TREMOR 01/02/2008  . EAR PAIN, RIGHT 12/05/2007  . FATIGUE 12/05/2007  . TACHYCARDIA 12/05/2007  . DYSPNEA 12/05/2007  . Neck pain 10/15/2007  . BACK PAIN, LUMBAR 10/15/2007  . Pain in Soft  Tissues of Limb 10/15/2007  . Hyperlipidemia 09/13/2007  . BIPOLAR DISORDER UNSPECIFIED 09/13/2007  . Headache(784.0) 09/13/2007  . Diabetes ([place]) 06/05/2007    Past Surgical History:  Procedure Laterality Date  . back surgury     lumbar 2001  . CESAREAN SECTION     x 3  . thyroid fine needle aspiration  May 2008   showed non neoplastic goiter  .  VAGINAL HYSTERECTOMY     fibroids     OB History    Gravida  3   Para  3   Term  2   Preterm  1   AB      Living  3     SAB      TAB      Ectopic      Multiple      Live Births  3            Home Medications    Prior to Admission medications   Medication Sig Start Date End Date Taking? Authorizing Provider  calcium-vitamin D (OSCAL WITH D) 500-200 MG-UNIT tablet Take 1 tablet by mouth.    [provider]  cetirizine (ZYRTEC) 10 MG tablet Take 1 tablet (10 mg total) by mouth daily. 03/08/18   Ward, Ozella Almond, PA-C  Chlorhexidine Gluconate 2 % LIQD Apply topically.    [provider]  clindamycin (CLEOCIN) 150 MG capsule Take 1 capsule (150 mg total) by mouth every 6 (six) hours. 8/92/11   Delora Fuel, MD  dicyclomine (BENTYL) 20 MG tablet Take 1 tablet (20 mg total) by mouth 2 (two) times daily. 06/05/18   Maudie Flakes, MD  LO LOESTRIN FE 1 MG-10 MCG / 10 MCG tablet Take 1 tablet by mouth daily. 02/20/18   Truett Mainland, DO  metFORMIN (GLUCOPHAGE) 500 MG tablet Take 1 tablet (500 mg total) by mouth 2 (two) times daily with a meal. 01/11/18   Truett Mainland, DO  Multiple Vitamins-Minerals (MULTIVITAMIN WITH MINERALS) tablet Take 1 tablet by mouth daily.    [provider]  omeprazole (PRILOSEC) 20 MG capsule Take 1 capsule (20 mg total) by mouth daily. 06/13/18   Law, Bea Graff, PA-C  ondansetron (ZOFRAN ODT) 4 MG disintegrating tablet Take 1 tablet (4 mg total) by mouth every 8 (eight) hours as needed for nausea or vomiting. 06/17/18   McDonald, Mia A, PA-C  oxyCODONE-acetaminophen (PERCOCET/ROXICET) 5-325 MG tablet Take 1-2 tablets by mouth every 4 (four) hours as needed for severe pain. 9/41/74   Delora Fuel, MD  penicillin v potassium (VEETID) 500 MG tablet Take 500 mg by mouth 4 (four) times daily.    [provider]  Probiotic Product (PROBIOTIC PO) Take 1 tablet by mouth as needed.     [provider]    ranitidine (ZANTAC) 150 MG capsule Take 1 capsule (150 mg total) by mouth daily. 06/13/18   Law, Bea Graff, PA-C  sucralfate (CARAFATE) 1 GM/10ML suspension Take 10 mLs (1 g total) by mouth 4 (four) times daily -  with meals and at bedtime. 06/13/18   Law, Bea Graff, PA-C  tiZANidine (ZANAFLEX) 4 MG tablet Take 1 tablet (4 mg total) by mouth every 6 (six) hours as needed for muscle spasms. 01/22/18   Dene Gentry, MD    Family History Family History  Problem Relation Age of Onset  . Thyroid disease Mother   . Diabetes Mother   . Asthma Mother   . Hypertension Father   .  Hyperlipidemia Father   . Diabetes Father   . Emphysema Other   . Coronary artery disease Other   . Cancer Other        pancreatic and breast cancer  . Cancer Other        lung and prostate cancer  . Breast cancer Neg Hx     Social History Social History   Tobacco Use  . Smoking status: Never Smoker  . Smokeless tobacco: Never Used  Substance Use Topics  . Alcohol use: No  . Drug use: No     Allergies   Sulfa antibiotics; Contrast media [iodinated diagnostic agents]; Promethazine hcl; and Reglan [metoclopramide]   Review of Systems Review of Systems  All other systems reviewed and are negative.    Physical Exam Updated Vital Signs BP (!) 161/112 (BP Location: Right Arm)   Pulse (!) 107   Temp 97.8 F (36.6 C) (Oral)   Resp 18   SpO2 100%   Physical Exam  Constitutional: She is oriented to person, place, and time. She appears well-developed and well-nourished.  HENT:  Head: Normocephalic.  Eyes: EOM are normal.  Neck: Normal range of motion.  Pulmonary/Chest: Effort normal.  Abdominal: She exhibits no distension.  Musculoskeletal:  Full range of motion of the right knee.  No joint effusion.  Crepitus of the patella found.  Normal extension and flexion at the level of the right knee.  No warmth or erythema.  Normal PT and DP pulse in the right foot.  Neurological: She is alert and  oriented to person, place, and time.  Psychiatric: She has a normal mood and affect.  Nursing note and vitals reviewed.    ED Treatments / Results  Labs (all labs ordered are listed, but only abnormal results are displayed) Labs Reviewed - No data to display  EKG None  Radiology Dg Knee Complete 4 Views Right  Result Date: 07/16/2018 CLINICAL DATA:  Pain under patella. EXAM: RIGHT KNEE - COMPLETE 4+ VIEW COMPARISON:  MR 07/16/2012 FINDINGS: No joint effusion. There is no fracture or dislocation identified. No radiopaque foreign bodies are soft tissue calcifications. IMPRESSION: 1. Negative exam. Electronically Signed   By: Kerby Moors M.D.   On: 07/16/2018 09:19    Procedures Procedures (including critical care time)  Medications Ordered in ED Medications - No data to display   Initial Impression / Assessment and Plan / ED Course  I have reviewed the triage vital signs and the nursing notes.  Pertinent labs & imaging results that were available during my care of the patient were reviewed by me and considered in my medical decision making (see chart for details).     May represent patellofemoral syndrome however the pain seems somewhat acute for this.  Ongoing follow-up with her sports medicine physician.  No indication for additional treatment at this time.  No acute osseous injury or abnormality found on plain film.   Final Clinical Impressions(s) / ED Diagnoses   Final diagnoses:  Acute pain of right knee    ED Discharge Orders    None       Jola Schmidt, MD 07/16/18 [phone]

## 2018-07-16 NOTE — ED Triage Notes (Signed)
Pt c/o rt knee pain since Saturday after doing light leg exercise. Saw MD yesterday states no tears

## 2018-07-18 DIAGNOSIS — R1011 Right upper quadrant pain: Secondary | ICD-10-CM | POA: Diagnosis not present

## 2018-07-18 DIAGNOSIS — K2970 Gastritis, unspecified, without bleeding: Secondary | ICD-10-CM | POA: Diagnosis not present

## 2018-07-18 DIAGNOSIS — K219 Gastro-esophageal reflux disease without esophagitis: Secondary | ICD-10-CM | POA: Diagnosis not present

## 2018-07-18 DIAGNOSIS — K319 Disease of stomach and duodenum, unspecified: Secondary | ICD-10-CM | POA: Diagnosis not present

## 2018-07-23 ENCOUNTER — Ambulatory Visit: Payer: Medicaid Other | Admitting: Family Medicine

## 2018-08-05 ENCOUNTER — Other Ambulatory Visit: Payer: Medicaid Other

## 2018-08-08 ENCOUNTER — Encounter: Payer: Self-pay | Admitting: Family Medicine

## 2018-08-08 ENCOUNTER — Ambulatory Visit (INDEPENDENT_AMBULATORY_CARE_PROVIDER_SITE_OTHER): Payer: Self-pay | Admitting: Family Medicine

## 2018-08-08 VITALS — BP 135/98 | HR 118 | Ht 64.0 in | Wt 186.0 lb

## 2018-08-08 DIAGNOSIS — G609 Hereditary and idiopathic neuropathy, unspecified: Secondary | ICD-10-CM

## 2018-08-08 NOTE — Progress Notes (Addendum)
PCP: Eston Esters, NP  Subjective:   HPI: Patient is a 42 y.o. female here for bilateral leg pain.  Patient reports for about 2 weeks she's had what started as fatigue, tiredness in her legs. Then developed a needle like and water sensation below both knees that became a burning and bug crawling sensation below the legs. Bothers her to have covers touch her lower legs. Pain level 7/10 and sharp, burning as noted above. No acute injury. Has back pain but also has fibromyalgia so difficult for her to discern if this is from fibromyalgia or related to current pain in legs. Has not tried anything for this. No skin changes, numbness.  Past Medical History:  Diagnosis Date  . ADHD (attention deficit hyperactivity disorder)   . ALLERGIC RHINITIS 06/26/2008  . ANEMIA-IRON DEFICIENCY 06/26/2008  . 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  . ANKLE PAIN, LEFT 06/26/2008  . BACK PAIN, LUMBAR 10/15/2007  . BIPOLAR DISORDER UNSPECIFIED 09/13/2007  . CERVICAL RADICULOPATHY, LEFT 06/26/2008  . Cervicalgia 10/15/2007  . Chronic pain syndrome 03/21/2011  . COMMON MIGRAINE 06/26/2008  . DIABETES MELLITUS, TYPE II 06/05/2007  . DYSPNEA 12/05/2007  . EAR PAIN, RIGHT 12/05/2007  . ELEVATED BP READING WITHOUT DX HYPERTENSION 07/29/2009  . FATIGUE 12/05/2007  . FIBROMYALGIA 06/26/2008  . GASTROENTERITIS, ACUTE 12/15/2008  . GERD 06/26/2008  . Headache(784.0) 09/13/2007  . HYPERLIPIDEMIA 09/13/2007  . JOINT EFFUSION, RIGHT KNEE 07/29/2009  . KNEE PAIN, LEFT 11/21/2010  . LUMBAR RADICULOPATHY, LEFT 06/26/2008  . OTHER DISEASE OF PHARYNX OR NASOPHARYNX 07/03/2008  . PERIPHERAL EDEMA 01/06/2009  . POLYARTHRITIS 11/21/2010  . SHOULDER PAIN, BILATERAL 07/03/2008  . SINUSITIS- ACUTE-NOS 07/29/2009  . TACHYCARDIA 12/05/2007  . THYROID NODULE, RIGHT 11/20/2008  . TREMOR 01/02/2008  . Vertigo     Current Outpatient Medications on File Prior to Visit  Medication Sig Dispense Refill  . calcium-vitamin D (OSCAL WITH D) 500-200 MG-UNIT tablet  Take 1 tablet by mouth.    . cetirizine (ZYRTEC) 10 MG tablet Take 1 tablet (10 mg total) by mouth daily. 30 tablet 0  . Chlorhexidine Gluconate 2 % LIQD Apply topically.    . clindamycin (CLEOCIN) 150 MG capsule Take 1 capsule (150 mg total) by mouth every 6 (six) hours. 28 capsule 0  . dicyclomine (BENTYL) 20 MG tablet Take 1 tablet (20 mg total) by mouth 2 (two) times daily. 20 tablet 0  . LO LOESTRIN FE 1 MG-10 MCG / 10 MCG tablet Take 1 tablet by mouth daily. 3 Package 3  . metFORMIN (GLUCOPHAGE) 500 MG tablet Take 1 tablet (500 mg total) by mouth 2 (two) times daily with a meal. 60 tablet 5  . Multiple Vitamins-Minerals (MULTIVITAMIN WITH MINERALS) tablet Take 1 tablet by mouth daily.    Marland Kitchen omeprazole (PRILOSEC) 20 MG capsule Take 1 capsule (20 mg total) by mouth daily. 30 capsule 1  . ondansetron (ZOFRAN ODT) 4 MG disintegrating tablet Take 1 tablet (4 mg total) by mouth every 8 (eight) hours as needed for nausea or vomiting. 20 tablet 0  . oxyCODONE-acetaminophen (PERCOCET/ROXICET) 5-325 MG tablet Take 1-2 tablets by mouth every 4 (four) hours as needed for severe pain. 20 tablet 0  . penicillin v potassium (VEETID) 500 MG tablet Take 500 mg by mouth 4 (four) times daily.    . Probiotic Product (PROBIOTIC PO) Take 1 tablet by mouth as needed.     . ranitidine (ZANTAC) 150 MG capsule Take 1 capsule (150 mg total) by mouth daily. 30 capsule 1  .  sucralfate (CARAFATE) 1 GM/10ML suspension Take 10 mLs (1 g total) by mouth 4 (four) times daily -  with meals and at bedtime. 420 mL 0  . tiZANidine (ZANAFLEX) 4 MG tablet Take 1 tablet (4 mg total) by mouth every 6 (six) hours as needed for muscle spasms. 60 tablet 1   No current facility-administered medications on file prior to visit.     Past Surgical History:  Procedure Laterality Date  . back surgury     lumbar 2001  . CESAREAN SECTION     x 3  . thyroid fine needle aspiration  May 2008   showed non neoplastic goiter  . VAGINAL  HYSTERECTOMY     fibroids    Allergies  Allergen Reactions  . Sulfa Antibiotics Anaphylaxis  . Contrast Media [Iodinated Diagnostic Agents] Itching    Mri, severe heaving ~ can take benadryl without reaction  . Promethazine Hcl     Iv dose with benadryl causes severe aggrivation  . Reglan [Metoclopramide]     "It makes me crazy"    Social History   Socioeconomic History  . Marital status: Married    Spouse name: Not on file  . Number of children: 3  . Years of education: Not on file  . Highest education level: Not on file  Occupational History    Employer: UNEMPLOYED  Social Needs  . Financial resource strain: Not on file  . Food insecurity:    Worry: Not on file    Inability: Not on file  . Transportation needs:    Medical: Not on file    Non-medical: Not on file  Tobacco Use  . Smoking status: Never Smoker  . Smokeless tobacco: Never Used  Substance and Sexual Activity  . Alcohol use: No  . Drug use: No  . Sexual activity: Not on file  Lifestyle  . Physical activity:    Days per week: Not on file    Minutes per session: Not on file  . Stress: Not on file  Relationships  . Social connections:    Talks on phone: Not on file    Gets together: Not on file    Attends religious service: Not on file    Active member of club or organization: Not on file    Attends meetings of clubs or organizations: Not on file    Relationship status: Not on file  . Intimate partner violence:    Fear of current or ex partner: Not on file    Emotionally abused: Not on file    Physically abused: Not on file    Forced sexual activity: Not on file  Other Topics Concern  . Not on file  Social History Narrative  . Not on file    Family History  Problem Relation Age of Onset  . Thyroid disease Mother   . Diabetes Mother   . Asthma Mother   . Hypertension Father   . Hyperlipidemia Father   . Diabetes Father   . Emphysema Other   . Coronary artery disease Other   . Cancer  Other        pancreatic and breast cancer  . Cancer Other        lung and prostate cancer  . Breast cancer Neg Hx     BP (!) 135/98   Pulse (!) 118   Ht 5\' 4"  (1.626 m)   Wt 186 lb (84.4 kg)   BMI 31.93 kg/m   Review of Systems: See HPI above.  Objective:  Physical Exam:  Gen: NAD, comfortable in exam room  Back: No gross deformity, scoliosis. TTP bilateral lumbar paraspinal regions.  No midline or bony TTP. FROM with pain on flexion and extension. Strength LEs 5/5 all muscle groups. 2+ MSRs in patellar and achilles tendons, equal bilaterally. Negative SLRs. Sensation intact to light touch bilaterally.  Bilateral hips: No deformity. FROM with 5/5 strength. No tenderness to palpation. NVI distally. Negative logroll bilateral hips  Assessment & Plan:  1. Bilateral leg pain - patient's exam is reassuring.  Consistent with peripheral neuropathy, less likely spinal stenosis.  Last A1c in chart from March was 13.4.  Discussed importance of glycemic control and possibility of contributing to neuropathy.  Check B12 as well.  Discussed consideration of prednisone dose pack but also discussed this would elevate her blood sugars.  Consider gabapentin, nortriptyline, neurology referral based on labs.

## 2018-08-08 NOTE — Patient Instructions (Signed)
We will check your B12. Find out what your last A1c was - if your diabetes is uncontrolled for a lengthy period you can get this peripheral neuropathy. If you're struggling to get the diabetes under control you may need to see an endocrinologist. Prednisone dose pack is a consideration if the B12 is normal but this will increase your blood sugars for more than a week. Other nerve-blocking medications like gabapentin, nortriptyline are also considerations. We will talk more tomorrow when I get the B12 back.

## 2018-08-09 ENCOUNTER — Other Ambulatory Visit: Payer: Medicaid Other

## 2018-08-09 LAB — VITAMIN B12: Vitamin B-12: 514 pg/mL (ref 232–1245)

## 2018-08-10 ENCOUNTER — Encounter: Payer: Self-pay | Admitting: Family Medicine

## 2018-08-15 ENCOUNTER — Ambulatory Visit (INDEPENDENT_AMBULATORY_CARE_PROVIDER_SITE_OTHER): Payer: Self-pay | Admitting: Family Medicine

## 2018-08-15 ENCOUNTER — Ambulatory Visit: Payer: Medicaid Other | Admitting: Family Medicine

## 2018-08-15 ENCOUNTER — Encounter: Payer: Self-pay | Admitting: Family Medicine

## 2018-08-15 VITALS — BP 149/97 | HR 106 | Ht 64.0 in | Wt 184.6 lb

## 2018-08-15 DIAGNOSIS — M25551 Pain in right hip: Secondary | ICD-10-CM

## 2018-08-15 NOTE — Patient Instructions (Signed)
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 Try to avoid painful activities when possible. Pick 2-3 stretches where you feel the pull in the area of pain - do 3 of these and hold for 20-30 seconds twice a day. Standing hip rotations and hip side raises 3 sets of 10 once a day - add ankle weights if these are too easy. Ibuprofen 600mg  three times a day with food OR aleve 2 tabs twice a day with food for pain and inflammation as needed. Tennis ball to massage area when sitting if this helps Consider physical therapy. Follow up with me in 6 weeks.

## 2018-08-18 ENCOUNTER — Encounter: Payer: Self-pay | Admitting: Family Medicine

## 2018-08-18 NOTE — Progress Notes (Signed)
PCP: Eston Esters, NP  Subjective:   HPI: Patient is a 42 y.o. female here for right leg pain  10/17: Patient reports for about 2 weeks she's had what started as fatigue, tiredness in her legs. Then developed a needle like and water sensation below both knees that became a burning and bug crawling sensation below the legs. Bothers her to have covers touch her lower legs. Pain level 7/10 and sharp, burning as noted above. No acute injury. Has back pain but also has fibromyalgia so difficult for her to discern if this is from fibromyalgia or related to current pain in legs. Has not tried anything for this. No skin changes, numbness.  10/24: Patient reports the pain she has had in both legs has improved. She states she's having tenderness now in right buttock radiating down back of leg to calf. Pain is 5/10 level, more of a tenderness. Worse if massaging the area. Sometimes feels weak. Has tried heat/ice, tizanidine. No skin changes.  Past Medical History:  Diagnosis Date  . ADHD (attention deficit hyperactivity disorder)   . ALLERGIC RHINITIS 06/26/2008  . ANEMIA-IRON DEFICIENCY 06/26/2008  . 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  . ANKLE PAIN, LEFT 06/26/2008  . BACK PAIN, LUMBAR 10/15/2007  . BIPOLAR DISORDER UNSPECIFIED 09/13/2007  . CERVICAL RADICULOPATHY, LEFT 06/26/2008  . Cervicalgia 10/15/2007  . Chronic pain syndrome 03/21/2011  . COMMON MIGRAINE 06/26/2008  . DIABETES MELLITUS, TYPE II 06/05/2007  . DYSPNEA 12/05/2007  . EAR PAIN, RIGHT 12/05/2007  . ELEVATED BP READING WITHOUT DX HYPERTENSION 07/29/2009  . FATIGUE 12/05/2007  . FIBROMYALGIA 06/26/2008  . GASTROENTERITIS, ACUTE 12/15/2008  . GERD 06/26/2008  . Headache(784.0) 09/13/2007  . HYPERLIPIDEMIA 09/13/2007  . JOINT EFFUSION, RIGHT KNEE 07/29/2009  . KNEE PAIN, LEFT 11/21/2010  . LUMBAR RADICULOPATHY, LEFT 06/26/2008  . OTHER DISEASE OF PHARYNX OR NASOPHARYNX 07/03/2008  . PERIPHERAL EDEMA 01/06/2009  . POLYARTHRITIS 11/21/2010  .  SHOULDER PAIN, BILATERAL 07/03/2008  . SINUSITIS- ACUTE-NOS 07/29/2009  . TACHYCARDIA 12/05/2007  . THYROID NODULE, RIGHT 11/20/2008  . TREMOR 01/02/2008  . Vertigo     Current Outpatient Medications on File Prior to Visit  Medication Sig Dispense Refill  . calcium-vitamin D (OSCAL WITH D) 500-200 MG-UNIT tablet Take 1 tablet by mouth.    . cetirizine (ZYRTEC) 10 MG tablet Take 1 tablet (10 mg total) by mouth daily. 30 tablet 0  . Chlorhexidine Gluconate 2 % LIQD Apply topically.    . clindamycin (CLEOCIN) 150 MG capsule Take 1 capsule (150 mg total) by mouth every 6 (six) hours. 28 capsule 0  . dicyclomine (BENTYL) 20 MG tablet Take 1 tablet (20 mg total) by mouth 2 (two) times daily. 20 tablet 0  . LO LOESTRIN FE 1 MG-10 MCG / 10 MCG tablet Take 1 tablet by mouth daily. 3 Package 3  . metFORMIN (GLUCOPHAGE) 500 MG tablet Take 1 tablet (500 mg total) by mouth 2 (two) times daily with a meal. 60 tablet 5  . Multiple Vitamins-Minerals (MULTIVITAMIN WITH MINERALS) tablet Take 1 tablet by mouth daily.    Marland Kitchen omeprazole (PRILOSEC) 20 MG capsule Take 1 capsule (20 mg total) by mouth daily. 30 capsule 1  . ondansetron (ZOFRAN ODT) 4 MG disintegrating tablet Take 1 tablet (4 mg total) by mouth every 8 (eight) hours as needed for nausea or vomiting. 20 tablet 0  . oxyCODONE-acetaminophen (PERCOCET/ROXICET) 5-325 MG tablet Take 1-2 tablets by mouth every 4 (four) hours as needed for severe pain. 20 tablet 0  . penicillin  v potassium (VEETID) 500 MG tablet Take 500 mg by mouth 4 (four) times daily.    . Probiotic Product (PROBIOTIC PO) Take 1 tablet by mouth as needed.     . ranitidine (ZANTAC) 150 MG capsule Take 1 capsule (150 mg total) by mouth daily. 30 capsule 1  . sucralfate (CARAFATE) 1 GM/10ML suspension Take 10 mLs (1 g total) by mouth 4 (four) times daily -  with meals and at bedtime. 420 mL 0  . tiZANidine (ZANAFLEX) 4 MG tablet Take 1 tablet (4 mg total) by mouth every 6 (six) hours as needed for  muscle spasms. 60 tablet 1   No current facility-administered medications on file prior to visit.     Past Surgical History:  Procedure Laterality Date  . back surgury     lumbar 2001  . CESAREAN SECTION     x 3  . thyroid fine needle aspiration  May 2008   showed non neoplastic goiter  . VAGINAL HYSTERECTOMY     fibroids    Allergies  Allergen Reactions  . Sulfa Antibiotics Anaphylaxis  . Contrast Media [Iodinated Diagnostic Agents] Itching    Mri, severe heaving ~ can take benadryl without reaction  . Promethazine Hcl     Iv dose with benadryl causes severe aggrivation  . Reglan [Metoclopramide]     "It makes me crazy"    Social History   Socioeconomic History  . Marital status: Married    Spouse name: Not on file  . Number of children: 3  . Years of education: Not on file  . Highest education level: Not on file  Occupational History    Employer: UNEMPLOYED  Social Needs  . Financial resource strain: Not on file  . Food insecurity:    Worry: Not on file    Inability: Not on file  . Transportation needs:    Medical: Not on file    Non-medical: Not on file  Tobacco Use  . Smoking status: Never Smoker  . Smokeless tobacco: Never Used  Substance and Sexual Activity  . Alcohol use: No  . Drug use: No  . Sexual activity: Not on file  Lifestyle  . Physical activity:    Days per week: Not on file    Minutes per session: Not on file  . Stress: Not on file  Relationships  . Social connections:    Talks on phone: Not on file    Gets together: Not on file    Attends religious service: Not on file    Active member of club or organization: Not on file    Attends meetings of clubs or organizations: Not on file    Relationship status: Not on file  . Intimate partner violence:    Fear of current or ex partner: Not on file    Emotionally abused: Not on file    Physically abused: Not on file    Forced sexual activity: Not on file  Other Topics Concern  . Not on  file  Social History Narrative  . Not on file    Family History  Problem Relation Age of Onset  . Thyroid disease Mother   . Diabetes Mother   . Asthma Mother   . Hypertension Father   . Hyperlipidemia Father   . Diabetes Father   . Emphysema Other   . Coronary artery disease Other   . Cancer Other        pancreatic and breast cancer  . Cancer Other  lung and prostate cancer  . Breast cancer Neg Hx     BP (!) 149/97   Pulse (!) 106   Ht 5\' 4"  (1.626 m)   Wt 184 lb 9.6 oz (83.7 kg)   BMI 31.69 kg/m   Review of Systems: See HPI above.     Objective:  Physical Exam:  Gen: NAD, comfortable in exam room  Back: No gross deformity, scoliosis. TTP bilateral lumbar paraspinal regions < right buttock over piriformis.  No midline or bony TTP. FROM without pain currently. Strength LEs 5/5 all muscle groups except hip abduction.   2+ MSRs in patellar and achilles tendons, equal bilaterally. Negative SLRs. Sensation intact to light touch bilaterally.  Right hip: No deformity. FROM with 5/5 strength except 4/5 right hip abduction. TTP over piriformis, external rotators. NVI distally. Negative logroll. Positive piriformis, negative fabers.  Assessment & Plan:  1.  Right hip pain - consistent with piriformis syndrome.  Shown home exercises and stretches to do daily.  Ibuprofen or aleve.  Consider physical therapy.  F/u in 6 weeks.

## 2018-08-19 ENCOUNTER — Other Ambulatory Visit: Payer: Self-pay

## 2018-08-19 ENCOUNTER — Encounter (HOSPITAL_COMMUNITY): Payer: Self-pay | Admitting: Emergency Medicine

## 2018-08-19 ENCOUNTER — Ambulatory Visit (HOSPITAL_COMMUNITY)
Admission: EM | Admit: 2018-08-19 | Discharge: 2018-08-19 | Disposition: A | Discharge status: Home / Self Care | Payer: Medicaid Other | Attending: Emergency Medicine | Admitting: Emergency Medicine

## 2018-08-19 DIAGNOSIS — K0889 Other specified disorders of teeth and supporting structures: Secondary | ICD-10-CM

## 2018-08-19 MED ORDER — MAGIC MOUTHWASH W/LIDOCAINE: 15.0000 mL | Freq: Four times a day (QID) | ORAL | Status: DC | PRN

## 2018-08-19 MED ORDER — MAGIC MOUTHWASH W/LIDOCAINE: 15.0000 mL | Freq: Four times a day (QID) | ORAL | 0 refills | Status: DC | PRN

## 2018-08-19 NOTE — ED Provider Notes (Signed)
[place]    CSN: [id number] Arrival date & time: 08/19/18  5102     History   Chief Complaint Chief Complaint  Patient presents with  . Dental Pain    HPI Shirley [name] is a 42 y.o. female.    C/o LT upper tooth pain after eating fries. States that she has been seeing an Pharmacist, community and oral surgeron dealing with dry socket. Has not taken anything pta.  Past Medical History:  Diagnosis Date  . ADHD (attention deficit hyperactivity disorder)   . ALLERGIC RHINITIS 06/26/2008  . ANEMIA-IRON DEFICIENCY 06/26/2008  . 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  . ANKLE PAIN, LEFT 06/26/2008  . BACK PAIN, LUMBAR 10/15/2007  . BIPOLAR DISORDER UNSPECIFIED 09/13/2007  . CERVICAL RADICULOPATHY, LEFT 06/26/2008  . Cervicalgia 10/15/2007  . Chronic pain syndrome 03/21/2011  . COMMON MIGRAINE 06/26/2008  . DIABETES MELLITUS, TYPE II 06/05/2007  . DYSPNEA 12/05/2007  . EAR PAIN, RIGHT 12/05/2007  . ELEVATED BP READING WITHOUT DX HYPERTENSION 07/29/2009  . FATIGUE 12/05/2007  . FIBROMYALGIA 06/26/2008  . GASTROENTERITIS, ACUTE 12/15/2008  . GERD 06/26/2008  . Headache(784.0) 09/13/2007  . HYPERLIPIDEMIA 09/13/2007  . JOINT EFFUSION, RIGHT KNEE 07/29/2009  . KNEE PAIN, LEFT 11/21/2010  . LUMBAR RADICULOPATHY, LEFT 06/26/2008  . OTHER DISEASE OF PHARYNX OR NASOPHARYNX 07/03/2008  . PERIPHERAL EDEMA 01/06/2009  . POLYARTHRITIS 11/21/2010  . SHOULDER PAIN, BILATERAL 07/03/2008  . SINUSITIS- ACUTE-NOS 07/29/2009  . TACHYCARDIA 12/05/2007  . THYROID NODULE, RIGHT 11/20/2008  . TREMOR 01/02/2008  . Vertigo     Patient Active Problem List   Diagnosis Date Noted  . Right foot injury, initial encounter 02/08/2018  . Bilateral knee pain 01/14/2018  . MRSA (methicillin resistant staph aureus) urine culture positive on 07/06/16 07/06/2016  . Foot injury 12/20/2015  . Thoracic back pain 12/20/2015  . Low back pain radiating to right leg 12/01/2015  . Left shoulder pain 11/07/2013  . Left flank pain 11/07/2013  .  Encounter for long-term (current) use of high-risk medication 05/29/2011  . Preventative health care 05/28/2011  . Chronic pain syndrome 03/21/2011  . KNEE PAIN, LEFT 11/21/2010  . POLYARTHRITIS 11/21/2010  . BACK PAIN 11/22/2009  . JOINT EFFUSION, RIGHT KNEE 07/29/2009  . ELEVATED BP READING WITHOUT DX HYPERTENSION 07/29/2009  . 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  . PERIPHERAL EDEMA 01/06/2009  . GASTROENTERITIS, ACUTE 12/15/2008  . DYSPHAGIA UNSPECIFIED 11/23/2008  . THYROID NODULE, RIGHT 11/20/2008  . SHOULDER PAIN, BILATERAL 07/03/2008  . ANEMIA-IRON DEFICIENCY 06/26/2008  . COMMON MIGRAINE 06/26/2008  . ALLERGIC RHINITIS 06/26/2008  . GERD 06/26/2008  . ANKLE PAIN, LEFT 06/26/2008  . CERVICAL RADICULOPATHY, LEFT 06/26/2008  . LUMBAR RADICULOPATHY, LEFT 06/26/2008  . FIBROMYALGIA 06/26/2008  . TREMOR 01/02/2008  . EAR PAIN, RIGHT 12/05/2007  . FATIGUE 12/05/2007  . TACHYCARDIA 12/05/2007  . DYSPNEA 12/05/2007  . Neck pain 10/15/2007  . BACK PAIN, LUMBAR 10/15/2007  . Pain in Soft Tissues of Limb 10/15/2007  . Hyperlipidemia 09/13/2007  . BIPOLAR DISORDER UNSPECIFIED 09/13/2007  . Headache(784.0) 09/13/2007  . Diabetes ([place]) 06/05/2007    Past Surgical History:  Procedure Laterality Date  . back surgury     lumbar 2001  . CESAREAN SECTION     x 3  . thyroid fine needle aspiration  May 2008   showed non neoplastic goiter  . VAGINAL HYSTERECTOMY     fibroids    OB History    Gravida  3   Para  3   Term  2   Preterm  1   AB      Living  3     SAB      TAB      Ectopic      Multiple      Live Births  3            Home Medications    Prior to Admission medications   Medication Sig Start Date End Date Taking? Authorizing Provider  acetaminophen (TYLENOL) 325 MG tablet Take 650 mg by mouth every 6 (six) hours as needed.   Yes [provider]  cetirizine (ZYRTEC) 10 MG tablet Take 1 tablet (10 mg total) by mouth daily. 03/08/18  Yes Ward, Ozella Almond, PA-C  ibuprofen (ADVIL,MOTRIN) 200 MG tablet Take 200 mg by mouth every 6 (six) hours as needed.   Yes [provider]  metFORMIN (GLUCOPHAGE) 500 MG tablet Take 1 tablet (500 mg total) by mouth 2 (two) times daily with a meal. 01/11/18  Yes Truett Mainland, DO  calcium-vitamin D (OSCAL WITH D) 500-200 MG-UNIT tablet Take 1 tablet by mouth.    [provider]  Chlorhexidine Gluconate 2 % LIQD Apply topically.    [provider]  clindamycin (CLEOCIN) 150 MG capsule Take 1 capsule (150 mg total) by mouth every 6 (six) hours. 8/41/66   Delora Fuel, MD  dicyclomine (BENTYL) 20 MG tablet Take 1 tablet (20 mg total) by mouth 2 (two) times daily. 06/05/18   Maudie Flakes, MD  LO LOESTRIN FE 1 MG-10 MCG / 10 MCG tablet Take 1 tablet by mouth daily. 02/20/18   Truett Mainland, DO  Multiple Vitamins-Minerals (MULTIVITAMIN WITH MINERALS) tablet Take 1 tablet by mouth daily.    [provider]  omeprazole (PRILOSEC) 20 MG capsule Take 1 capsule (20 mg total) by mouth daily. 06/13/18   Law, Bea Graff, PA-C  ondansetron (ZOFRAN ODT) 4 MG disintegrating tablet Take 1 tablet (4 mg total) by mouth every 8 (eight) hours as needed for nausea or vomiting. 06/17/18   McDonald, Mia A, PA-C  oxyCODONE-acetaminophen (PERCOCET/ROXICET) 5-325 MG tablet Take 1-2 tablets by mouth every 4 (four) hours as needed for severe pain. 0/63/01   Delora Fuel, MD  penicillin v potassium (VEETID) 500 MG tablet Take 500 mg by mouth 4 (four) times daily.    [provider]  Probiotic Product (PROBIOTIC PO) Take 1 tablet by mouth as needed.     [provider]  ranitidine (ZANTAC) 150 MG capsule Take 1 capsule (150 mg total) by mouth daily. 06/13/18   Law, Bea Graff, PA-C  sucralfate (CARAFATE) 1 GM/10ML suspension Take 10 mLs (1 g total) by mouth 4 (four) times daily -  with meals and at bedtime. 06/13/18   Law, Bea Graff, PA-C  tiZANidine (ZANAFLEX) 4 MG tablet Take 1  tablet (4 mg total) by mouth every 6 (six) hours as needed for muscle spasms. 01/22/18   Dene Gentry, MD    Family History Family History  Problem Relation Age of Onset  . Thyroid disease Mother   . Diabetes Mother   . Asthma Mother   . Hypertension Father   . Hyperlipidemia Father   . Diabetes Father   . Emphysema Other   . Coronary artery disease Other   . Cancer Other        pancreatic and breast cancer  . Cancer Other        lung and prostate cancer  . Breast cancer Neg Hx  Social History Social History   Tobacco Use  . Smoking status: Never Smoker  . Smokeless tobacco: Never Used  Substance Use Topics  . Alcohol use: No  . Drug use: No     Allergies   Sulfa antibiotics; Contrast media [iodinated diagnostic agents]; Promethazine hcl; and Reglan [metoclopramide]   Review of Systems Review of Systems  Constitutional: Negative.   HENT: Positive for dental problem.   Eyes: Negative.   Respiratory: Negative.   Cardiovascular: Negative.   Neurological: Negative.      Physical Exam Triage Vital Signs ED Triage Vitals  Enc Vitals Group     BP 08/19/18 1944 134/85     Pulse Rate 08/19/18 1944 (!) 114     Resp 08/19/18 1944 18     Temp 08/19/18 1944 97.9 F (36.6 C)     Temp Source 08/19/18 1944 Oral     SpO2 08/19/18 1944 99 %     Weight --      Height --      Head Circumference --      Peak Flow --      Pain Score 08/19/18 1939 6     Pain Loc --      Pain Edu? --      Excl. in [place]? --    No data found.  Updated Vital Signs BP 134/85 (BP Location: Right Arm)   Pulse (!) 114   Temp 97.9 F (36.6 C) (Oral)   Resp 18   SpO2 99%   Visual Acuity     Physical Exam  Constitutional: She appears well-developed.  HENT:  Head: Normocephalic.  Right Ear: External ear normal.  Left Ear: External ear normal.  Mouth/Throat: Oropharynx is clear and moist.  Lt upper tooth pain 4th , no erythema, no infection,   Eyes: Pupils are equal, round, and  reactive to light.  Neck: Normal range of motion.  Cardiovascular: Normal rate and regular rhythm.  Pulmonary/Chest: Effort normal and breath sounds normal.  Abdominal: Soft.  Neurological: She is alert.     UC Treatments / Results  Labs (all labs ordered are listed, but only abnormal results are displayed) Labs Reviewed - No data to display  EKG None  Radiology No results found.  Procedures Procedures (including critical care time)  Medications Ordered in UC Medications  magic mouthwash w/lidocaine (has no administration in time range)    Initial Impression / Assessment and Plan / UC Course  I have reviewed the triage vital signs and the nursing notes.  Pertinent labs & imaging results that were available during my care of the patient were reviewed by me and considered in my medical decision making (see chart for details).    Will need to follow up with oral surgeon  May use the mouth wash as a temp help with pain  Final Clinical Impressions(s) / UC Diagnoses   Final diagnoses:  Pain, dental  Toothache     Discharge Instructions     You will need to follow up with your oral surgeon for any further tx  May use the mouth wash to help  Your tooth is not currently infected      ED Prescriptions    None     Controlled Substance Prescriptions [place] Controlled Substance Registry consulted? Not Applicable   Marney Setting, NP 08/19/18 2008

## 2018-08-19 NOTE — Discharge Instructions (Addendum)
You will need to follow up with your oral surgeon for any further tx  May use the mouth wash to help  Your tooth is not currently infected

## 2018-08-19 NOTE — ED Triage Notes (Signed)
Patient is having dental pain.  Patient broke a tooth chewing on soft food, 2 days ago.  Pain in left side of face.  Tooth that hurts is on the top

## 2018-09-26 ENCOUNTER — Ambulatory Visit (INDEPENDENT_AMBULATORY_CARE_PROVIDER_SITE_OTHER): Payer: Medicaid Other | Admitting: Family Medicine

## 2018-09-26 ENCOUNTER — Encounter: Payer: Self-pay | Admitting: Family Medicine

## 2018-09-26 VITALS — BP 126/88 | HR 108 | Ht 64.0 in | Wt 198.0 lb

## 2018-09-26 DIAGNOSIS — M25551 Pain in right hip: Secondary | ICD-10-CM

## 2018-09-26 NOTE — Progress Notes (Signed)
PCP: Eston Esters, NP  Subjective:   HPI: Patient is a 42 y.o. female here for right leg pain  10/17: Patient reports for about 2 weeks she's had what started as fatigue, tiredness in her legs. Then developed a needle like and water sensation below both knees that became a burning and bug crawling sensation below the legs. Bothers her to have covers touch her lower legs. Pain level 7/10 and sharp, burning as noted above. No acute injury. Has back pain but also has fibromyalgia so difficult for her to discern if this is from fibromyalgia or related to current pain in legs. Has not tried anything for this. No skin changes, numbness.  10/24: Patient reports the pain she has had in both legs has improved. She states she's having tenderness now in right buttock radiating down back of leg to calf. Pain is 5/10 level, more of a tenderness. Worse if massaging the area. Sometimes feels weak. Has tried heat/ice, tizanidine. No skin changes.  12/5: Patient reports she's doing very well. No pain currently. Doing home exercises and stretches. No numbness. Was asking about intermittent bug crawling sensation she gets in extremities - not interested in seeing neurology.  Past Medical History:  Diagnosis Date  . ADHD (attention deficit hyperactivity disorder)   . ALLERGIC RHINITIS 06/26/2008  . ANEMIA-IRON DEFICIENCY 06/26/2008  . 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  . ANKLE PAIN, LEFT 06/26/2008  . BACK PAIN, LUMBAR 10/15/2007  . BIPOLAR DISORDER UNSPECIFIED 09/13/2007  . CERVICAL RADICULOPATHY, LEFT 06/26/2008  . Cervicalgia 10/15/2007  . Chronic pain syndrome 03/21/2011  . COMMON MIGRAINE 06/26/2008  . DIABETES MELLITUS, TYPE II 06/05/2007  . DYSPNEA 12/05/2007  . EAR PAIN, RIGHT 12/05/2007  . ELEVATED BP READING WITHOUT DX HYPERTENSION 07/29/2009  . FATIGUE 12/05/2007  . FIBROMYALGIA 06/26/2008  . GASTROENTERITIS, ACUTE 12/15/2008  . GERD 06/26/2008  . Headache(784.0) 09/13/2007  . HYPERLIPIDEMIA  09/13/2007  . JOINT EFFUSION, RIGHT KNEE 07/29/2009  . KNEE PAIN, LEFT 11/21/2010  . LUMBAR RADICULOPATHY, LEFT 06/26/2008  . OTHER DISEASE OF PHARYNX OR NASOPHARYNX 07/03/2008  . PERIPHERAL EDEMA 01/06/2009  . POLYARTHRITIS 11/21/2010  . SHOULDER PAIN, BILATERAL 07/03/2008  . SINUSITIS- ACUTE-NOS 07/29/2009  . TACHYCARDIA 12/05/2007  . THYROID NODULE, RIGHT 11/20/2008  . TREMOR 01/02/2008  . Vertigo     Current Outpatient Medications on File Prior to Visit  Medication Sig Dispense Refill  . acetaminophen (TYLENOL) 325 MG tablet Take 650 mg by mouth every 6 (six) hours as needed.    . calcium-vitamin D (OSCAL WITH D) 500-200 MG-UNIT tablet Take 1 tablet by mouth.    . cetirizine (ZYRTEC) 10 MG tablet Take 1 tablet (10 mg total) by mouth daily. 30 tablet 0  . Chlorhexidine Gluconate 2 % LIQD Apply topically.    . clindamycin (CLEOCIN) 150 MG capsule Take 1 capsule (150 mg total) by mouth every 6 (six) hours. 28 capsule 0  . dicyclomine (BENTYL) 20 MG tablet Take 1 tablet (20 mg total) by mouth 2 (two) times daily. 20 tablet 0  . ibuprofen (ADVIL,MOTRIN) 200 MG tablet Take 200 mg by mouth every 6 (six) hours as needed.    . LO LOESTRIN FE 1 MG-10 MCG / 10 MCG tablet Take 1 tablet by mouth daily. 3 Package 3  . magic mouthwash w/lidocaine SOLN Take 15 mLs by mouth 4 (four) times daily as needed for mouth pain. 15 mL 0  . metFORMIN (GLUCOPHAGE) 500 MG tablet Take 1 tablet (500 mg total) by mouth 2 (two) times daily  with a meal. 60 tablet 5  . Multiple Vitamins-Minerals (MULTIVITAMIN WITH MINERALS) tablet Take 1 tablet by mouth daily.    Marland Kitchen omeprazole (PRILOSEC) 20 MG capsule Take 1 capsule (20 mg total) by mouth daily. 30 capsule 1  . ondansetron (ZOFRAN ODT) 4 MG disintegrating tablet Take 1 tablet (4 mg total) by mouth every 8 (eight) hours as needed for nausea or vomiting. 20 tablet 0  . oxyCODONE-acetaminophen (PERCOCET/ROXICET) 5-325 MG tablet Take 1-2 tablets by mouth every 4 (four) hours as  needed for severe pain. 20 tablet 0  . penicillin v potassium (VEETID) 500 MG tablet Take 500 mg by mouth 4 (four) times daily.    . Probiotic Product (PROBIOTIC PO) Take 1 tablet by mouth as needed.     . ranitidine (ZANTAC) 150 MG capsule Take 1 capsule (150 mg total) by mouth daily. 30 capsule 1  . sucralfate (CARAFATE) 1 GM/10ML suspension Take 10 mLs (1 g total) by mouth 4 (four) times daily -  with meals and at bedtime. 420 mL 0  . tiZANidine (ZANAFLEX) 4 MG tablet Take 1 tablet (4 mg total) by mouth every 6 (six) hours as needed for muscle spasms. 60 tablet 1   No current facility-administered medications on file prior to visit.     Past Surgical History:  Procedure Laterality Date  . back surgury     lumbar 2001  . CESAREAN SECTION     x 3  . thyroid fine needle aspiration  May 2008   showed non neoplastic goiter  . VAGINAL HYSTERECTOMY     fibroids    Allergies  Allergen Reactions  . Sulfa Antibiotics Anaphylaxis  . Contrast Media [Iodinated Diagnostic Agents] Itching    Mri, severe heaving ~ can take benadryl without reaction  . Promethazine Hcl     Iv dose with benadryl causes severe aggrivation  . Reglan [Metoclopramide]     "It makes me crazy"    Social History   Socioeconomic History  . Marital status: Married    Spouse name: Not on file  . Number of children: 3  . Years of education: Not on file  . Highest education level: Not on file  Occupational History    Employer: UNEMPLOYED  Social Needs  . Financial resource strain: Not on file  . Food insecurity:    Worry: Not on file    Inability: Not on file  . Transportation needs:    Medical: Not on file    Non-medical: Not on file  Tobacco Use  . Smoking status: Never Smoker  . Smokeless tobacco: Never Used  Substance and Sexual Activity  . Alcohol use: No  . Drug use: No  . Sexual activity: Not on file  Lifestyle  . Physical activity:    Days per week: Not on file    Minutes per session: Not on  file  . Stress: Not on file  Relationships  . Social connections:    Talks on phone: Not on file    Gets together: Not on file    Attends religious service: Not on file    Active member of club or organization: Not on file    Attends meetings of clubs or organizations: Not on file    Relationship status: Not on file  . Intimate partner violence:    Fear of current or ex partner: Not on file    Emotionally abused: Not on file    Physically abused: Not on file    Forced  sexual activity: Not on file  Other Topics Concern  . Not on file  Social History Narrative  . Not on file    Family History  Problem Relation Age of Onset  . Thyroid disease Mother   . Diabetes Mother   . Asthma Mother   . Hypertension Father   . Hyperlipidemia Father   . Diabetes Father   . Emphysema Other   . Coronary artery disease Other   . Cancer Other        pancreatic and breast cancer  . Cancer Other        lung and prostate cancer  . Breast cancer Neg Hx     BP 126/88   Pulse (!) 108   Ht 5\' 4"  (1.626 m)   Wt 198 lb (89.8 kg)   BMI 33.99 kg/m   Review of Systems: See HPI above.     Objective:  Physical Exam:  Gen: NAD, comfortable in exam room  Back: No gross deformity, scoliosis. TTP .  No midline or bony TTP. FROM. Strength LEs 5/5 all muscle groups.   2+ MSRs in patellar and achilles tendons, equal bilaterally. Negative SLRs. Sensation intact to light touch bilaterally. Negative logroll bilateral hips  Assessment & Plan:  1.  Right hip pain - 2/2 piriformis syndrome.  Much improved with no pain currently.  Continue home exercises.  F/u prn.

## 2018-09-26 NOTE — Patient Instructions (Signed)
You're doing great! Have a wonderful Christmas and New Year!

## 2018-10-03 ENCOUNTER — Ambulatory Visit (INDEPENDENT_AMBULATORY_CARE_PROVIDER_SITE_OTHER): Payer: Medicaid Other | Admitting: Family Medicine

## 2018-10-03 ENCOUNTER — Encounter: Payer: Self-pay | Admitting: Family Medicine

## 2018-10-03 VITALS — BP 103/75 | HR 115 | Ht 64.0 in | Wt 190.1 lb

## 2018-10-03 DIAGNOSIS — E041 Nontoxic single thyroid nodule: Secondary | ICD-10-CM | POA: Diagnosis not present

## 2018-10-03 DIAGNOSIS — S30814A Abrasion of vagina and vulva, initial encounter: Secondary | ICD-10-CM | POA: Diagnosis not present

## 2018-10-03 DIAGNOSIS — R102 Pelvic and perineal pain: Secondary | ICD-10-CM | POA: Diagnosis not present

## 2018-10-03 MED ORDER — MICONAZOLE NITRATE 2 % VA CREA: 1.0000 | TOPICAL_CREAM | Freq: Two times a day (BID) | VAGINAL | 2 refills | Status: DC

## 2018-10-03 NOTE — Progress Notes (Signed)
   Subjective:    Patient ID: Shirley [name], female    DOB: 27-Sep-1976, 42 y.o.   MRN: [id number]  HPI Patient seen for vaginal dryness and vaginal abrasions during sex, which restarted a few months ago. Abrasions are now on perineum. Occurs with every sexual encounter. No vaginal irritation, but c/o vaginal dryness that is not improving despite use of lubricants (silicone, water, etc).  Also noticed enlarge thyroid on right side.   Review of Systems     Objective:   Physical Exam Constitutional:      Appearance: Normal appearance.  Neck:     Thyroid: Thyromegaly (on right) present.  Cardiovascular:     Rate and Rhythm: Normal rate.  Pulmonary:     Effort: Pulmonary effort is normal.  Abdominal:     Palpations: Abdomen is soft.     Hernia: There is no hernia in the right inguinal area or left inguinal area.  Genitourinary:    Labia:        Right: No rash, tenderness, lesion or injury.        Left: No rash, tenderness, lesion or injury.     Lymphadenopathy:     Lower Body: No right inguinal adenopathy. No left inguinal adenopathy.  Neurological:     Mental Status: She is alert.       Assessment & Plan:  1. Thyroid nodule - TSH  2. Abrasion of vagina and vulva, initial encounter Improved with antifungal last time - will give two weeks of topical antifungal. If not improved, may need steroid. Will check FSH. If elevated, may benefit from HRT for vaginal dryness. - Follicle stimulating hormone

## 2018-10-04 LAB — TSH: TSH: 1.22 u[IU]/mL (ref 0.450–4.500)

## 2018-10-04 LAB — FOLLICLE STIMULATING HORMONE: FSH: 28.1 m[IU]/mL

## 2018-10-07 MED ORDER — MICONAZOLE NITRATE 2 % VA CREA: 1.0000 | TOPICAL_CREAM | Freq: Two times a day (BID) | VAGINAL | 2 refills | Status: AC

## 2018-10-28 ENCOUNTER — Telehealth: Payer: Self-pay

## 2018-10-28 NOTE — Telephone Encounter (Signed)
Pt called the office stating that she is having thyroid pain. Pt prefers to see Dr. Nehemiah Settle. Explained to pt that Dr. Nehemiah Settle will not be back in the office until 1/16. Pt was unable to accept the appointment times offered on 1/16 and 1/17 with Dr. Nehemiah Settle due to her work schedule. Pt  also refused to see other providers in the office. Pt is scheduled to see Dr. Nehemiah Settle on 11/14/18 at 2:30 pm.

## 2018-11-14 ENCOUNTER — Encounter: Payer: Self-pay | Admitting: Family Medicine

## 2018-11-14 ENCOUNTER — Ambulatory Visit (INDEPENDENT_AMBULATORY_CARE_PROVIDER_SITE_OTHER): Payer: Medicaid Other | Admitting: Family Medicine

## 2018-11-14 VITALS — BP 130/80 | HR 94 | Ht 64.0 in | Wt 198.1 lb

## 2018-11-14 DIAGNOSIS — N952 Postmenopausal atrophic vaginitis: Secondary | ICD-10-CM | POA: Diagnosis not present

## 2018-11-14 DIAGNOSIS — E041 Nontoxic single thyroid nodule: Secondary | ICD-10-CM

## 2018-11-14 MED ORDER — ESTRADIOL 0.5 MG PO TABS: 0.5000 mg | ORAL_TABLET | Freq: Every day | ORAL | 1 refills | Status: DC

## 2018-11-14 MED ORDER — PROGESTERONE MICRONIZED 200 MG PO CAPS: 200.0000 mg | ORAL_CAPSULE | Freq: Every day | ORAL | 1 refills | Status: DC

## 2018-11-14 NOTE — Progress Notes (Signed)
   Subjective:    Patient ID: Shirley [name], female    DOB: [birth year], 43 y.o.   MRN: [id number]  HPI Patient seen for lab testing follow up. [place] shows she is in menopause. Continues to have extreme vaginal dryness, leading to skin tears during sex due to vaginal atrophy.   Also, TSH testing normal.   Review of Systems     Objective:   Physical Exam Constitutional:      Appearance: Normal appearance.  Neck:     Musculoskeletal: Normal range of motion.     Comments: Right thyroid nodule Cardiovascular:     Rate and Rhythm: Normal rate and regular rhythm.     Pulses: Normal pulses.     Heart sounds: Normal heart sounds.  Pulmonary:     Effort: Pulmonary effort is normal.     Breath sounds: Normal breath sounds.  Neurological:     Mental Status: She is alert.       Assessment & Plan:  1. Atrophic vaginitis Discussed HRT. Patient amenable.  Start estrogen 0.5mg  and prometrium 200mg .  Discussed risks with HRT - cancer, clotting, etc.   2. Thyroid nodule Will get thyroid US. - US THYROID; Future

## 2018-11-30 ENCOUNTER — Ambulatory Visit (HOSPITAL_BASED_OUTPATIENT_CLINIC_OR_DEPARTMENT_OTHER)
Admission: RE | Admit: 2018-11-30 | Discharge: 2018-11-30 | Disposition: A | Discharge status: Home / Self Care | Payer: Medicaid Other | Source: Ambulatory Visit | Attending: Family Medicine | Admitting: Family Medicine

## 2018-11-30 DIAGNOSIS — E041 Nontoxic single thyroid nodule: Secondary | ICD-10-CM | POA: Diagnosis not present

## 2019-01-30 ENCOUNTER — Ambulatory Visit: Payer: Medicaid Other | Admitting: Family Medicine

## 2019-01-30 ENCOUNTER — Other Ambulatory Visit: Payer: Self-pay | Admitting: Family Medicine

## 2019-01-30 MED ORDER — PROGESTERONE MICRONIZED 200 MG PO CAPS: 200.0000 mg | ORAL_CAPSULE | Freq: Every day | ORAL | 1 refills | Status: DC

## 2019-02-03 MED ORDER — ESTRADIOL 0.5 MG PO TABS: 0.5000 mg | ORAL_TABLET | Freq: Every day | ORAL | 3 refills | Status: DC

## 2019-02-11 ENCOUNTER — Ambulatory Visit: Payer: Medicaid Other | Admitting: Family Medicine

## 2019-02-11 ENCOUNTER — Encounter: Payer: Self-pay | Admitting: Family Medicine

## 2019-02-11 ENCOUNTER — Other Ambulatory Visit: Payer: Self-pay

## 2019-02-11 VITALS — BP 125/89 | HR 104 | Temp 97.8°F | Ht 64.0 in | Wt 195.0 lb

## 2019-02-11 DIAGNOSIS — M67912 Unspecified disorder of synovium and tendon, left shoulder: Secondary | ICD-10-CM

## 2019-02-11 MED ORDER — NAPROXEN 500 MG PO TABS: 500.0000 mg | ORAL_TABLET | Freq: Two times a day (BID) | ORAL | 1 refills | Status: DC | PRN

## 2019-02-11 NOTE — Progress Notes (Signed)
   HPI  CC: Left shoulder pain  Shirley [name] is a 43 year old female presents for left shoulder pain.  She states her shoulder pain started yesterday.  She states she noticed it when she rolled in bed and rolled onto her left shoulder.  The pain has persisted since that time.  She has a longstanding history of shoulder pain, which she was seen for in the past.  She was diagnosed with rotator cuff tendinopathy in 2018.  The pain has been intermittent since that time.  She states that it has not been present for several months prior to this.  She took ibuprofen 400mg  this morning, with some relief.  She states the pain is worse with overhead activity.  She states she had a fall back on April 2, but did not notice shoulder pain after that.  She denies any numbness and tingling down her arm.  She denies any weakness of handgrip.  She denies recent fevers or chills.  See HPI and/or previous note for associated ROS.  Objective: BP 125/89   Pulse (!) 104   Temp 97.8 F (36.6 C) (Oral)   Ht 5\' 4"  (1.626 m)   Wt 195 lb (88.5 kg)   BMI 33.47 kg/m  Gen: Right-Hand Dominant. NAD, well groomed, a/o x3, normal affect.  CV: Well-perfused. Warm.  Resp: Non-labored.  Neuro: Sensation intact throughout. No gross coordination deficits.  Gait: Nonpathologic posture, unremarkable stride without signs of limp or balance issues.  Left shoulder exam: No erythema, warmth, swelling noted.  Tenderness to palpation of the bicipital groove.  Tenderness to palpation over the [facility] joint.  Around a 5 degree lag in shoulder forward flexion.  Full range of motion in shoulder abduction, internal rotation, external rotation.  Strength 5 out of 5 throughout testing.  Positive Hawkins test, negative belly press off test, negative empty can test, negative speeds test, negative Yergason's test, negative crossover test.  Right shoulder exam: No erythema, warmth, swelling noted.  Tenderness palpation on exam.  Full range of motion in  forward flexion, shoulder abduction, internal rotation, external rotation.  Strength out of 5 throughout testing.  Negative Hawkins test, negative belly press off test, negative empty can test, negative speeds test, negative Yergason's test, negative crossover test.  Assessment and plan: Left shoulder pain, likely secondary to rotator cuff impingement with biceps tendinopathy.  We discussed treatment options at today's visit.  We will proceed with conservative measures.  We will start her on naproxen 500 mg twice daily.  We will also give her some rotator cuff exercises to work on with a light Thera-Band.  She should continue to exercise, avoiding overhead activity until she is pain-free.  She should let pain be her guide, with focus on lower extremity and core work.  We will see back in clinic as needed.  Lewanda Rife, MD [facility] 02/11/2019 2:44 PM

## 2019-02-11 NOTE — Patient Instructions (Signed)
You have rotator cuff impingement and biceps tendinitis Try to avoid painful activities (overhead activities, lifting with extended arm) as much as possible. Naproxen 500mg  twice a day with food for pain and inflammation - take for 7-10 days then as needed. Can take tylenol in addition to this. Subacromial injection may be beneficial to help with pain and to decrease inflammation if this doesn't improve. Consider physical therapy with transition to home exercise program. Do home exercise program with theraband and scapular stabilization exercises daily 3 sets of 10 once a day - wait a few days before starting these. If not improving at follow-up we will consider imaging, injection, physical therapy, and/or nitro patches. Follow up with me in 1 month.

## 2019-02-13 ENCOUNTER — Ambulatory Visit: Payer: Medicaid Other | Admitting: Family Medicine

## 2019-03-10 ENCOUNTER — Other Ambulatory Visit: Payer: Self-pay

## 2019-03-10 ENCOUNTER — Encounter: Payer: Self-pay | Admitting: Family Medicine

## 2019-03-10 ENCOUNTER — Ambulatory Visit: Payer: Medicaid Other | Admitting: Family Medicine

## 2019-03-10 VITALS — BP 153/92 | HR 92 | Ht 64.0 in | Wt 198.0 lb

## 2019-03-10 DIAGNOSIS — M549 Dorsalgia, unspecified: Secondary | ICD-10-CM

## 2019-03-10 MED ORDER — CYCLOBENZAPRINE HCL 10 MG PO TABS: 10.0000 mg | ORAL_TABLET | Freq: Three times a day (TID) | ORAL | 1 refills | Status: DC | PRN

## 2019-03-10 NOTE — Patient Instructions (Signed)
You have spasms of your trapezius and scapular stabilizer muscles. Heat 15 minutes at a time 3-4 times a day as needed. Continue naproxen twice a day with food. Flexeril as needed for spasms. The home exercises are the most important part of your treatment - hold stretches for 20-30 seconds, repeat 3 times. Other exercises 3 sets of 10 once a day. Ok to take tylenol as well, use topical capsaicin (don't use this with the heat though). Consider physical therapy if not improving. Follow up with me in 4 weeks but call me sooner if you're struggling.

## 2019-03-11 ENCOUNTER — Encounter: Payer: Self-pay | Admitting: Family Medicine

## 2019-03-11 NOTE — Progress Notes (Signed)
PCP: Eston Esters, NP  Subjective:   HPI: Patient is a 43 y.o. female here for upper back pain.  Patient denies known injury. She states about 2 days ago she was lying in bed watching TV when she felt sharp spasms between her shoulder blades. Difficulty getting this to relax. Has had pain in this area since then currently at 4/10 level and sharp. She noticed this yesterday when she was just standing in the freezer. Tried naproxen, heat, biofreeze. No radiation. No bowel/bladder dysfunction.  Past Medical History:  Diagnosis Date  . ADHD (attention deficit hyperactivity disorder)   . ALLERGIC RHINITIS 06/26/2008  . ANEMIA-IRON DEFICIENCY 06/26/2008  . 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  . ANKLE PAIN, LEFT 06/26/2008  . BACK PAIN, LUMBAR 10/15/2007  . BIPOLAR DISORDER UNSPECIFIED 09/13/2007  . CERVICAL RADICULOPATHY, LEFT 06/26/2008  . Cervicalgia 10/15/2007  . Chronic pain syndrome 03/21/2011  . COMMON MIGRAINE 06/26/2008  . DIABETES MELLITUS, TYPE II 06/05/2007  . DYSPNEA 12/05/2007  . EAR PAIN, RIGHT 12/05/2007  . ELEVATED BP READING WITHOUT DX HYPERTENSION 07/29/2009  . FATIGUE 12/05/2007  . FIBROMYALGIA 06/26/2008  . GASTROENTERITIS, ACUTE 12/15/2008  . GERD 06/26/2008  . Headache(784.0) 09/13/2007  . HYPERLIPIDEMIA 09/13/2007  . JOINT EFFUSION, RIGHT KNEE 07/29/2009  . KNEE PAIN, LEFT 11/21/2010  . LUMBAR RADICULOPATHY, LEFT 06/26/2008  . OTHER DISEASE OF PHARYNX OR NASOPHARYNX 07/03/2008  . PERIPHERAL EDEMA 01/06/2009  . POLYARTHRITIS 11/21/2010  . SHOULDER PAIN, BILATERAL 07/03/2008  . SINUSITIS- ACUTE-NOS 07/29/2009  . TACHYCARDIA 12/05/2007  . THYROID NODULE, RIGHT 11/20/2008  . TREMOR 01/02/2008  . Vertigo     Current Outpatient Medications on File Prior to Visit  Medication Sig Dispense Refill  . estradiol (ESTRACE) 0.5 MG tablet Take 1 tablet (0.5 mg total) by mouth daily. 90 tablet 3  . metFORMIN (GLUCOPHAGE) 500 MG tablet Take 1 tablet (500 mg total) by mouth 2 (two) times daily with a  meal. 60 tablet 5  . naproxen (NAPROSYN) 500 MG tablet Take 1 tablet (500 mg total) by mouth 2 (two) times daily as needed. 60 tablet 1  . progesterone (PROMETRIUM) 200 MG capsule Take 1 capsule (200 mg total) by mouth daily. 90 capsule 1   No current facility-administered medications on file prior to visit.     Past Surgical History:  Procedure Laterality Date  . back surgury     lumbar 2001  . CESAREAN SECTION     x 3  . thyroid fine needle aspiration  May 2008   showed non neoplastic goiter  . VAGINAL HYSTERECTOMY     fibroids    Allergies  Allergen Reactions  . Sulfa Antibiotics Anaphylaxis  . Contrast Media [Iodinated Diagnostic Agents] Itching    Mri, severe heaving ~ can take benadryl without reaction  . Promethazine Hcl     Iv dose with benadryl causes severe aggrivation  . Reglan [Metoclopramide]     "It makes me crazy"    Social History   Socioeconomic History  . Marital status: Married    Spouse name: Not on file  . Number of children: 3  . Years of education: Not on file  . Highest education level: Not on file  Occupational History    Employer: UNEMPLOYED  Social Needs  . Financial resource strain: Not on file  . Food insecurity:    Worry: Not on file    Inability: Not on file  . Transportation needs:    Medical: Not on file    Non-medical: Not on file  Tobacco Use  . Smoking status: Never Smoker  . Smokeless tobacco: Never Used  Substance and Sexual Activity  . Alcohol use: No  . Drug use: No  . Sexual activity: Not on file  Lifestyle  . Physical activity:    Days per week: Not on file    Minutes per session: Not on file  . Stress: Not on file  Relationships  . Social connections:    Talks on phone: Not on file    Gets together: Not on file    Attends religious service: Not on file    Active member of club or organization: Not on file    Attends meetings of clubs or organizations: Not on file    Relationship status: Not on file  .  Intimate partner violence:    Fear of current or ex partner: Not on file    Emotionally abused: Not on file    Physically abused: Not on file    Forced sexual activity: Not on file  Other Topics Concern  . Not on file  Social History Narrative  . Not on file    Family History  Problem Relation Age of Onset  . Thyroid disease Mother   . Diabetes Mother   . Asthma Mother   . Hypertension Father   . Hyperlipidemia Father   . Diabetes Father   . Emphysema Other   . Coronary artery disease Other   . Cancer Other        pancreatic and breast cancer  . Cancer Other        lung and prostate cancer  . Breast cancer Neg Hx     BP (!) 153/92   Pulse 92   Ht 5\' 4"  (1.626 m)   Wt 198 lb (89.8 kg)   BMI 33.99 kg/m   Review of Systems: See HPI above.     Objective:  Physical Exam:  Gen: NAD, comfortable in exam room  Neck/Upper back: No gross deformity, swelling, bruising. TTP bilateral thoracic paraspinal regions, trapezius.  No bony TTP. FROM with mild pain on flexion. BUE strength 5/5. Sensation intact to light touch.   1+ equal reflexes in triceps, biceps, brachioradialis tendons. NV intact distal BUEs.  Bilateral shoulders: No swelling, ecchymoses.  No gross deformity. No TTP. FROM. Strength 5/5 with empty can and resisted internal/external rotation. NV intact distally.   Assessment & Plan:  1. Thoracic strain - including rhomboids, trapezius.  Heat, naproxen with flexeril as needed.  Shown home exercises and stretches to do daily.  Consider physical therapy if not improving.  F/u in 4 weeks.

## 2019-03-20 ENCOUNTER — Encounter: Payer: Self-pay | Admitting: Family Medicine

## 2019-03-20 ENCOUNTER — Ambulatory Visit (INDEPENDENT_AMBULATORY_CARE_PROVIDER_SITE_OTHER): Payer: Medicaid Other | Admitting: Family Medicine

## 2019-03-20 ENCOUNTER — Other Ambulatory Visit: Payer: Self-pay

## 2019-03-20 VITALS — BP 136/83 | HR 124 | Wt 197.0 lb

## 2019-03-20 DIAGNOSIS — N952 Postmenopausal atrophic vaginitis: Secondary | ICD-10-CM

## 2019-03-20 NOTE — Progress Notes (Signed)
   Subjective:    Patient ID: Shirley [name], female    DOB: 1975-10-25, 43 y.o.   MRN: [id number]  HPI Patient seen for follow up of HRT. She takes her medications at night as it makes her sleepy and loopy. No other complications. Occasionally gets the vaginal fissures, but less often. Otherwise, doing well and has no other complaints.    Review of Systems     Objective:   Physical Exam Constitutional:      Appearance: Normal appearance.  Cardiovascular:     Rate and Rhythm: Normal rate.     Pulses: Normal pulses.  Abdominal:     General: Abdomen is flat.     Palpations: Abdomen is soft.  Neurological:     Mental Status: She is alert.       Assessment & Plan:  1. Atrophic vaginitis Continue with estrace and prometrium. Patient to call with any concerns. F/u in 6 months.

## 2019-04-07 ENCOUNTER — Ambulatory Visit: Payer: Medicaid Other | Admitting: Family Medicine

## 2019-04-07 ENCOUNTER — Other Ambulatory Visit: Payer: Self-pay

## 2019-04-07 ENCOUNTER — Encounter: Payer: Self-pay | Admitting: Family Medicine

## 2019-04-07 VITALS — BP 158/92 | HR 85 | Ht 65.0 in | Wt 200.0 lb

## 2019-04-07 DIAGNOSIS — M549 Dorsalgia, unspecified: Secondary | ICD-10-CM | POA: Diagnosis not present

## 2019-04-07 NOTE — Patient Instructions (Addendum)
You have spasms of your trapezius, scapular stabilizer muscles, and lats. Heat 15 minutes at a time 3-4 times a day as needed. Naproxen, flexeril if needed. Start physical therapy and do home exercises on days you don't go to therapy. Ok to take tylenol as well, use topical capsaicin (don't use this with the heat though). Follow up with me in 1 month to 6 weeks.P

## 2019-04-07 NOTE — Progress Notes (Signed)
PCP: Eston Esters, NP  Subjective:   HPI: Patient is a 43 y.o. female here for upper back pain.  5/18: Patient denies known injury. She states about 2 days ago she was lying in bed watching TV when she felt sharp spasms between her shoulder blades. Difficulty getting this to relax. Has had pain in this area since then currently at 4/10 level and sharp. She noticed this yesterday when she was just standing in the freezer. Tried naproxen, heat, biofreeze. No radiation. No bowel/bladder dysfunction.  6/15: Pt returns for thoracic back pain. Continues to have 4/10 pain. Continues to be worse with laying flat but is still bothersome throughout the day. She has been doing the home exercises as well as working with a Physiological scientist. She denies any neck pain. Her pain is still located between the scapulae. No associated numbness or tingling. No skin changes. No weakess  Past Medical History:  Diagnosis Date  . ADHD (attention deficit hyperactivity disorder)   . ALLERGIC RHINITIS 06/26/2008  . ANEMIA-IRON DEFICIENCY 06/26/2008  . 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  . ANKLE PAIN, LEFT 06/26/2008  . BACK PAIN, LUMBAR 10/15/2007  . BIPOLAR DISORDER UNSPECIFIED 09/13/2007  . CERVICAL RADICULOPATHY, LEFT 06/26/2008  . Cervicalgia 10/15/2007  . Chronic pain syndrome 03/21/2011  . COMMON MIGRAINE 06/26/2008  . DIABETES MELLITUS, TYPE II 06/05/2007  . DYSPNEA 12/05/2007  . EAR PAIN, RIGHT 12/05/2007  . ELEVATED BP READING WITHOUT DX HYPERTENSION 07/29/2009  . FATIGUE 12/05/2007  . FIBROMYALGIA 06/26/2008  . GASTROENTERITIS, ACUTE 12/15/2008  . GERD 06/26/2008  . Headache(784.0) 09/13/2007  . HYPERLIPIDEMIA 09/13/2007  . JOINT EFFUSION, RIGHT KNEE 07/29/2009  . KNEE PAIN, LEFT 11/21/2010  . LUMBAR RADICULOPATHY, LEFT 06/26/2008  . OTHER DISEASE OF PHARYNX OR NASOPHARYNX 07/03/2008  . PERIPHERAL EDEMA 01/06/2009  . POLYARTHRITIS 11/21/2010  . SHOULDER PAIN, BILATERAL 07/03/2008  . SINUSITIS- ACUTE-NOS 07/29/2009  .  TACHYCARDIA 12/05/2007  . THYROID NODULE, RIGHT 11/20/2008  . TREMOR 01/02/2008  . Vertigo     Current Outpatient Medications on File Prior to Visit  Medication Sig Dispense Refill  . cyclobenzaprine (FLEXERIL) 10 MG tablet Take 1 tablet (10 mg total) by mouth 3 (three) times daily as needed for muscle spasms. 60 tablet 1  . estradiol (ESTRACE) 0.5 MG tablet Take 1 tablet (0.5 mg total) by mouth daily. 90 tablet 3  . metFORMIN (GLUCOPHAGE) 500 MG tablet Take 1 tablet (500 mg total) by mouth 2 (two) times daily with a meal. 60 tablet 5  . naproxen (NAPROSYN) 500 MG tablet Take 1 tablet (500 mg total) by mouth 2 (two) times daily as needed. 60 tablet 1  . progesterone (PROMETRIUM) 200 MG capsule Take 1 capsule (200 mg total) by mouth daily. 90 capsule 1   No current facility-administered medications on file prior to visit.     Past Surgical History:  Procedure Laterality Date  . back surgury     lumbar 2001  . CESAREAN SECTION     x 3  . thyroid fine needle aspiration  May 2008   showed non neoplastic goiter  . VAGINAL HYSTERECTOMY     fibroids    Allergies  Allergen Reactions  . Sulfa Antibiotics Anaphylaxis  . Contrast Media [Iodinated Diagnostic Agents] Itching    Mri, severe heaving ~ can take benadryl without reaction  . Promethazine Hcl     Iv dose with benadryl causes severe aggrivation  . Reglan [Metoclopramide]     "It makes me crazy"    Social History  Socioeconomic History  . Marital status: Significant Other    Spouse name: Not on file  . Number of children: 3  . Years of education: Not on file  . Highest education level: Not on file  Occupational History    Employer: UNEMPLOYED  Social Needs  . Financial resource strain: Not on file  . Food insecurity    Worry: Not on file    Inability: Not on file  . Transportation needs    Medical: Not on file    Non-medical: Not on file  Tobacco Use  . Smoking status: Never Smoker  . Smokeless tobacco: Never  Used  Substance and Sexual Activity  . Alcohol use: No  . Drug use: No  . Sexual activity: Not on file  Lifestyle  . Physical activity    Days per week: Not on file    Minutes per session: Not on file  . Stress: Not on file  Relationships  . Social Herbalist on phone: Not on file    Gets together: Not on file    Attends religious service: Not on file    Active member of club or organization: Not on file    Attends meetings of clubs or organizations: Not on file    Relationship status: Not on file  . Intimate partner violence    Fear of current or ex partner: Not on file    Emotionally abused: Not on file    Physically abused: Not on file    Forced sexual activity: Not on file  Other Topics Concern  . Not on file  Social History Narrative  . Not on file    Family History  Problem Relation Age of Onset  . Thyroid disease Mother   . Diabetes Mother   . Asthma Mother   . Hypertension Father   . Hyperlipidemia Father   . Diabetes Father   . Emphysema Other   . Coronary artery disease Other   . Cancer Other        pancreatic and breast cancer  . Cancer Other        lung and prostate cancer  . Breast cancer Neg Hx     BP (!) 158/92   Pulse 85   Ht 5\' 5"  (1.651 m)   Wt 200 lb (90.7 kg)   BMI 33.28 kg/m   Review of Systems: See HPI above.     Objective:  Physical Exam:  Gen: awake, alert, NAD Pulm: breathing unlabored  Thoracic spine: - Inspection: no gross deformity - Palpation: diffuse TTP over the trapezius, rhomboids and latissimus  - ROM: full ROM - Strength: 5/5 strength of upper and lower extremities - Neuro: sensation grossly intact to light touch  Bilateral shoulders:  full ROM with 5/5 strength NV intact   Assessment & Plan:  1. Thoracic muscle strain - no neurologic symptoms. No significant improvement with HEP. Will refer to formal physical therapy. F/u in 6 weeks

## 2019-04-16 ENCOUNTER — Ambulatory Visit: Payer: Medicaid Other | Admitting: Physical Therapy

## 2019-04-23 ENCOUNTER — Ambulatory Visit: Payer: Medicaid Other | Admitting: Physical Therapy

## 2019-04-28 ENCOUNTER — Other Ambulatory Visit (HOSPITAL_COMMUNITY)
Admission: RE | Admit: 2019-04-28 | Discharge: 2019-04-28 | Disposition: A | Discharge status: Home / Self Care | Payer: BC Managed Care – PPO | Source: Ambulatory Visit | Attending: Family Medicine | Admitting: Family Medicine

## 2019-04-28 ENCOUNTER — Other Ambulatory Visit: Payer: Self-pay

## 2019-04-28 ENCOUNTER — Encounter: Payer: Self-pay | Admitting: Physical Therapy

## 2019-04-28 ENCOUNTER — Ambulatory Visit: Payer: BC Managed Care – PPO | Attending: Family Medicine | Admitting: Physical Therapy

## 2019-04-28 ENCOUNTER — Other Ambulatory Visit: Payer: Medicaid Other

## 2019-04-28 DIAGNOSIS — M6281 Muscle weakness (generalized): Secondary | ICD-10-CM | POA: Diagnosis not present

## 2019-04-28 DIAGNOSIS — N898 Other specified noninflammatory disorders of vagina: Secondary | ICD-10-CM

## 2019-04-28 DIAGNOSIS — M25512 Pain in left shoulder: Secondary | ICD-10-CM | POA: Insufficient documentation

## 2019-04-28 DIAGNOSIS — M25612 Stiffness of left shoulder, not elsewhere classified: Secondary | ICD-10-CM | POA: Diagnosis not present

## 2019-04-28 NOTE — Therapy (Signed)
[facility] [address]  [place], [place], [address] Phone: [phone]   Fax:  [phone]  Physical Therapy Evaluation  Patient Details  Name: Shirley [name] MRN: [id number] Date of Birth: 09-14-1976 Referring Provider (PT): Karlton Lemon, MD   Encounter Date: 04/28/2019  PT End of Session - 04/28/19 1653    Visit Number  1    Number of Visits  4    Date for PT Re-Evaluation  05/19/19    Authorization Type  Medicaid    PT Start Time  1627    PT Stop Time  1652    PT Time Calculation (min)  25 min    Activity Tolerance  Patient tolerated treatment well    Behavior During Therapy  [employer] for tasks assessed/performed       Past Medical History:  Diagnosis Date  . ADHD (attention deficit hyperactivity disorder)   . ALLERGIC RHINITIS 06/26/2008  . ANEMIA-IRON DEFICIENCY 06/26/2008  . 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  . ANKLE PAIN, LEFT 06/26/2008  . BACK PAIN, LUMBAR 10/15/2007  . BIPOLAR DISORDER UNSPECIFIED 09/13/2007  . CERVICAL RADICULOPATHY, LEFT 06/26/2008  . Cervicalgia 10/15/2007  . Chronic pain syndrome 03/21/2011  . COMMON MIGRAINE 06/26/2008  . DIABETES MELLITUS, TYPE II 06/05/2007  . DYSPNEA 12/05/2007  . EAR PAIN, RIGHT 12/05/2007  . ELEVATED BP READING WITHOUT DX HYPERTENSION 07/29/2009  . FATIGUE 12/05/2007  . FIBROMYALGIA 06/26/2008  . GASTROENTERITIS, ACUTE 12/15/2008  . GERD 06/26/2008  . Headache(784.0) 09/13/2007  . HYPERLIPIDEMIA 09/13/2007  . JOINT EFFUSION, RIGHT KNEE 07/29/2009  . KNEE PAIN, LEFT 11/21/2010  . LUMBAR RADICULOPATHY, LEFT 06/26/2008  . OTHER DISEASE OF PHARYNX OR NASOPHARYNX 07/03/2008  . PERIPHERAL EDEMA 01/06/2009  . POLYARTHRITIS 11/21/2010  . SHOULDER PAIN, BILATERAL 07/03/2008  . SINUSITIS- ACUTE-NOS 07/29/2009  . TACHYCARDIA 12/05/2007  . THYROID NODULE, RIGHT 11/20/2008  . TREMOR 01/02/2008  . Vertigo     Past Surgical History:  Procedure Laterality Date  . back surgury     lumbar 2001  .  CESAREAN SECTION     x 3  . thyroid fine needle aspiration  May 2008   showed non neoplastic goiter  . VAGINAL HYSTERECTOMY     fibroids    There were no vitals filed for this visit.   Subjective Assessment - 04/28/19 1629    Subjective  Patient reports" between the shoulder pain" has occurred for the past couple of months. Denies recent change in activity that could have brought this pain on. Denies N/T. Pain is spontaneous and with no pattern. L shoulder hurts with L sidelying- tries to avoid this. L shoulder hurts diffusely and radiates down the arm, stopping at elbow. No other aggravating factors.    Pertinent History  vertigo, tremor, tachycardia, B shoulder pain, polyarthritis, peripheral edema, L lumbar radiculopathy, L knee pain, HLD, HA/migraine, GERD, fibromyalgia, DMII, chronic pain syndrome, L cervical radiculopathy, bipolar disorder, L ankle pain, anemia, ADHD, back surgery 2001    Limitations  Lifting;House hold activities    Patient Stated Goals  build strength and not have to have surgery    Currently in Pain?  No/denies    Pain Score  0-No pain    Pain Location  Back    Pain Orientation  Mid    Pain Descriptors / Indicators  Aching;Spasm         [facility] PT Assessment - 04/28/19 1633      Assessment   Medical Diagnosis  Upper back  pain    Referring Provider (PT)  Karlton Lemon, MD    Onset Date/Surgical Date  01/27/19    Hand Dominance  Left    Next MD Visit  05/07/19    Prior Therapy  Yes      Precautions   Precautions  None      Balance Screen   Has the patient fallen in the past 6 months  No    Has the patient had a decrease in activity level because of a fear of falling?   No    Is the patient reluctant to leave their home because of a fear of falling?   No      Home Environment   Living Environment  Private residence   [place]  --   family   Available Help at Discharge  Family      Prior Function   Level of Independence   Independent    Vocation  Full time employment    Vocation Requirements  cooking    Leisure  none      Cognition   Overall Cognitive Status  Within Functional Limits for tasks assessed      Sensation   Light Touch  Appears Intact      Coordination   Gross Motor Movements are Fluid and Coordinated  Yes      Posture/Postural Control   Posture/Postural Control  Postural limitations    Postural Limitations  Rounded Shoulders;Forward head;Posterior pelvic tilt      ROM / Strength   AROM / PROM / Strength  Strength;AROM      AROM   AROM Assessment Site  Shoulder    Right/Left Shoulder  Right;Left    Right Shoulder Flexion  145 Degrees    Right Shoulder ABduction  167 Degrees    Right Shoulder Internal Rotation  --   FIR T6   Right Shoulder External Rotation  --   FER T3   Left Shoulder Flexion  139 Degrees    Left Shoulder ABduction  97 Degrees    Left Shoulder Internal Rotation  --   FIR T9   Left Shoulder External Rotation  --   FER top of head     Strength   Strength Assessment Site  Shoulder    Right/Left Shoulder  Right;Left    Right Shoulder Flexion  4+/5    Right Shoulder ABduction  4+/5    Right Shoulder Internal Rotation  5/5    Right Shoulder External Rotation  4+/5    Left Shoulder Flexion  4/5    Left Shoulder ABduction  4/5   pain in UT   Left Shoulder Internal Rotation  4-/5   pain    Left Shoulder External Rotation  4/5      Palpation   Palpation comment  TTP and increased soft tissue restriction in L rhomboids and thoracic paraspinals, proximal biceps tendon and pec, TTP in infraspinatus                Objective measurements completed on examination: See above findings.              PT Education - 04/28/19 1653    Education Details  prognosis, POC, HEP    Person(s) Educated  Patient    Methods  Explanation;Demonstration;Tactile cues;Verbal cues;Handout    Comprehension  Verbalized understanding;Returned demonstration        PT Short Term Goals - 04/28/19 1825      PT SHORT TERM  GOAL #1   Title  Patient to be independent with initial HEP.    Time  1    Period  Weeks    Status  New    Target Date  05/05/19        PT Long Term Goals - 04/28/19 1826      PT LONG TERM GOAL #1   Title  Patient to be independent with advanced HEP.    Time  3    Period  Weeks    Status  New    Target Date  05/19/19      PT LONG TERM GOAL #2   Title  Patient to demonstrate L shoulder AROM WFL and without pain limiting.    Time  3    Period  Weeks    Status  New    Target Date  05/19/19      PT LONG TERM GOAL #3   Title  Patient to demonstrate L shoulder strength >=4+/5.    Time  3    Period  Weeks    Status  New    Target Date  05/19/19      PT LONG TERM GOAL #4   Title  Patient to report tolerance of sleeping in L sidelying for 4 hours without increase in pain.    Time  3    Period  Weeks    Status  New    Target Date  05/19/19             Plan - 04/28/19 1817    Clinical Impression Statement  Patient is a 43y/o F presenting to OPPT with c/o L shoulder and periscapular pain for 2-3 months duration without known cause. Pain occurs diffusely over L shoulder, with intermittent radiation down arm, stopping at elbow.  Periscapular pain is spontaneous. L shoulder pain made worse only by L sidelying. Patient today with limited L shoulder AROM, decreased L shoulder strength, rounded and forward head posture, and TTP and increased soft tissue restriction in L rhomboids and thoracic paraspinals, proximal biceps tendon and pec, TTP in infraspinatus. Educated on gentle stretching and strengthening HEP- patient reported understanding. Would benefit from skilled PT services 1x/week for 3 weeks to address aforementioned impairments.    Personal Factors and Comorbidities  Comorbidity 3+;Time since onset of injury/illness/exacerbation;Profession;Past/Current Experience;Fitness    Comorbidities  vertigo, tremor,  tachycardia, B shoulder pain, polyarthritis, peripheral edema, L lumbar radiculopathy, L knee pain, HLD, HA/migraine, GERD, fibromyalgia, DMII, chronic pain syndrome, L cervical radiculopathy, bipolar disorder, L ankle pain, anemia, ADHD, back surgery 2001    Examination-Activity Limitations  Sleep;Caring for Others;Carry;Dressing;Hygiene/Grooming;Lift;Reach Overhead    Examination-Participation Restrictions  Cleaning;Shop;Community Activity;Yard Work;Driving;Laundry;Meal Prep;Interpersonal Relationship    Stability/Clinical Decision Making  Unstable/Unpredictable    Clinical Decision Making  Moderate    Rehab Potential  Good    PT Frequency  1x / week    PT Duration  3 weeks    PT Treatment/Interventions  ADLs/Self Care Home Management;Cryotherapy;Electrical Stimulation;Iontophoresis 4mg /ml Dexamethasone;Moist Heat;Therapeutic exercise;Therapeutic activities;Functional mobility training;Ultrasound;Neuromuscular re-education;Patient/family education;Manual techniques;Vasopneumatic Device;Taping;Splinting;Energy conservation;Dry needling;Passive range of motion    PT Next Visit Plan  reassess HEP    Consulted and Agree with Plan of Care  Patient       Patient will benefit from skilled therapeutic intervention in order to improve the following deficits and impairments:  Hypomobility, Increased edema, Decreased activity tolerance, Decreased strength, Increased fascial restricitons, Impaired UE functional use, Pain, Increased muscle spasms, Improper body mechanics, Decreased range of motion, Impaired  flexibility, Postural dysfunction  Visit Diagnosis: 1. Acute pain of left shoulder   2. Stiffness of left shoulder, not elsewhere classified   3. Muscle weakness (generalized)        Problem List Patient Active Problem List   Diagnosis Date Noted  . Right foot injury, initial encounter 02/08/2018  . Bilateral knee pain 01/14/2018  . MRSA (methicillin resistant staph aureus) urine culture  positive on 07/06/16 07/06/2016  . Foot injury 12/20/2015  . Thoracic back pain 12/20/2015  . Low back pain radiating to right leg 12/01/2015  . Left shoulder pain 11/07/2013  . Left flank pain 11/07/2013  . Encounter for long-term (current) use of high-risk medication 05/29/2011  . Preventative health care 05/28/2011  . Chronic pain syndrome 03/21/2011  . KNEE PAIN, LEFT 11/21/2010  . POLYARTHRITIS 11/21/2010  . BACK PAIN 11/22/2009  . JOINT EFFUSION, RIGHT KNEE 07/29/2009  . ELEVATED BP READING WITHOUT DX HYPERTENSION 07/29/2009  . 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  . PERIPHERAL EDEMA 01/06/2009  . GASTROENTERITIS, ACUTE 12/15/2008  . DYSPHAGIA UNSPECIFIED 11/23/2008  . THYROID NODULE, RIGHT 11/20/2008  . SHOULDER PAIN, BILATERAL 07/03/2008  . ANEMIA-IRON DEFICIENCY 06/26/2008  . COMMON MIGRAINE 06/26/2008  . ALLERGIC RHINITIS 06/26/2008  . GERD 06/26/2008  . ANKLE PAIN, LEFT 06/26/2008  . CERVICAL RADICULOPATHY, LEFT 06/26/2008  . LUMBAR RADICULOPATHY, LEFT 06/26/2008  . FIBROMYALGIA 06/26/2008  . TREMOR 01/02/2008  . EAR PAIN, RIGHT 12/05/2007  . FATIGUE 12/05/2007  . TACHYCARDIA 12/05/2007  . DYSPNEA 12/05/2007  . Neck pain 10/15/2007  . BACK PAIN, LUMBAR 10/15/2007  . Pain in Soft Tissues of Limb 10/15/2007  . Hyperlipidemia 09/13/2007  . BIPOLAR DISORDER UNSPECIFIED 09/13/2007  . Headache(784.0) 09/13/2007  . Diabetes ([place]) 06/05/2007    Janene Harvey, PT, DPT 04/28/19 6:29 PM    [facility] [address]  [place], [place], [address] Phone: [phone]   Fax:  [phone]  Name: Shirley [name] MRN: [id number] Date of Birth: 1976/10/09

## 2019-04-28 NOTE — Progress Notes (Signed)
Pt presents to the office with itchy vaginal discharge x 1 week. Pt states that she used body wash in her bath water and it caused swelling and irritation on her vagina. Self swab was sent to the lab.  Dayla Gasca l Anyelin Mogle, CMA

## 2019-04-29 ENCOUNTER — Other Ambulatory Visit: Payer: Medicaid Other

## 2019-04-29 NOTE — Progress Notes (Signed)
Chart reviewed - agree with CMA documentation.

## 2019-04-30 ENCOUNTER — Other Ambulatory Visit: Payer: Self-pay | Admitting: Family Medicine

## 2019-04-30 LAB — CERVICOVAGINAL ANCILLARY ONLY
Bacterial vaginitis: NEGATIVE
Candida vaginitis: POSITIVE — AB

## 2019-04-30 MED ORDER — FLUCONAZOLE 150 MG PO TABS: 150.0000 mg | ORAL_TABLET | Freq: Once | ORAL | 1 refills | Status: AC

## 2019-05-07 ENCOUNTER — Ambulatory Visit: Payer: Medicaid Other | Admitting: Family Medicine

## 2019-05-07 NOTE — Progress Notes (Deleted)
CECILIA VANCLEVE - 43 y.o. female MRN [id number]  Date of birth: [birth year]  SUBJECTIVE:  Including CC & ROS.  No chief complaint on file.   MIKEYLA MUSIC is a 44 y.o. female that is  ***.  ***   Review of Systems  HISTORY: Past Medical, Surgical, Social, and Family History Reviewed & Updated per EMR.   Pertinent Historical Findings include:  Past Medical History:  Diagnosis Date  . ADHD (attention deficit hyperactivity disorder)   . ALLERGIC RHINITIS 06/26/2008  . ANEMIA-IRON DEFICIENCY 06/26/2008  . 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  . ANKLE PAIN, LEFT 06/26/2008  . BACK PAIN, LUMBAR 10/15/2007  . BIPOLAR DISORDER UNSPECIFIED 09/13/2007  . CERVICAL RADICULOPATHY, LEFT 06/26/2008  . Cervicalgia 10/15/2007  . Chronic pain syndrome 03/21/2011  . COMMON MIGRAINE 06/26/2008  . DIABETES MELLITUS, TYPE II 06/05/2007  . DYSPNEA 12/05/2007  . EAR PAIN, RIGHT 12/05/2007  . ELEVATED BP READING WITHOUT DX HYPERTENSION 07/29/2009  . FATIGUE 12/05/2007  . FIBROMYALGIA 06/26/2008  . GASTROENTERITIS, ACUTE 12/15/2008  . GERD 06/26/2008  . Headache(784.0) 09/13/2007  . HYPERLIPIDEMIA 09/13/2007  . JOINT EFFUSION, RIGHT KNEE 07/29/2009  . KNEE PAIN, LEFT 11/21/2010  . LUMBAR RADICULOPATHY, LEFT 06/26/2008  . OTHER DISEASE OF PHARYNX OR NASOPHARYNX 07/03/2008  . PERIPHERAL EDEMA 01/06/2009  . POLYARTHRITIS 11/21/2010  . SHOULDER PAIN, BILATERAL 07/03/2008  . SINUSITIS- ACUTE-NOS 07/29/2009  . TACHYCARDIA 12/05/2007  . THYROID NODULE, RIGHT 11/20/2008  . TREMOR 01/02/2008  . Vertigo     Past Surgical History:  Procedure Laterality Date  . back surgury     lumbar 2001  . CESAREAN SECTION     x 3  . thyroid fine needle aspiration  May 2008   showed non neoplastic goiter  . VAGINAL HYSTERECTOMY     fibroids    Allergies  Allergen Reactions  . Sulfa Antibiotics Anaphylaxis  . Contrast Media [Iodinated Diagnostic Agents] Itching    Mri, severe heaving ~ can take benadryl without reaction  . Promethazine Hcl      Iv dose with benadryl causes severe aggrivation  . Reglan [Metoclopramide]     "It makes me crazy"    Family History  Problem Relation Age of Onset  . Thyroid disease Mother   . Diabetes Mother   . Asthma Mother   . Hypertension Father   . Hyperlipidemia Father   . Diabetes Father   . Emphysema Other   . Coronary artery disease Other   . Cancer Other        pancreatic and breast cancer  . Cancer Other        lung and prostate cancer  . Breast cancer Neg Hx      Social History   Socioeconomic History  . Marital status: Significant Other    Spouse name: Not on file  . Number of children: 3  . Years of education: Not on file  . Highest education level: Not on file  Occupational History    Employer: UNEMPLOYED  Social Needs  . Financial resource strain: Not on file  . Food insecurity    Worry: Not on file    Inability: Not on file  . Transportation needs    Medical: Not on file    Non-medical: Not on file  Tobacco Use  . Smoking status: Never Smoker  . Smokeless tobacco: Never Used  Substance and Sexual Activity  . Alcohol use: No  . Drug use: No  . Sexual activity: Not on file  Lifestyle  .  Physical activity    Days per week: Not on file    Minutes per session: Not on file  . Stress: Not on file  Relationships  . Social Herbalist on phone: Not on file    Gets together: Not on file    Attends religious service: Not on file    Active member of club or organization: Not on file    Attends meetings of clubs or organizations: Not on file    Relationship status: Not on file  . Intimate partner violence    Fear of current or ex partner: Not on file    Emotionally abused: Not on file    Physically abused: Not on file    Forced sexual activity: Not on file  Other Topics Concern  . Not on file  Social History Narrative  . Not on file     PHYSICAL EXAM:  VS: There were no vitals taken for this visit. Physical Exam Gen: NAD, alert,  cooperative with exam, well-appearing ENT: normal lips, normal nasal mucosa,  Eye: normal EOM, normal conjunctiva and lids CV:  no edema, +2 pedal pulses   Resp: no accessory muscle use, non-labored,  GI: no masses or tenderness, no hernia  Skin: no rashes, no areas of induration  Neuro: normal tone, normal sensation to touch Psych:  normal insight, alert and oriented MSK:  ***      ASSESSMENT & PLAN:   No problem-specific Assessment & Plan notes found for this encounter.

## 2019-05-08 ENCOUNTER — Ambulatory Visit: Payer: BC Managed Care – PPO | Admitting: Family Medicine

## 2019-05-08 ENCOUNTER — Encounter: Payer: Self-pay | Admitting: Family Medicine

## 2019-05-08 ENCOUNTER — Other Ambulatory Visit: Payer: Self-pay

## 2019-05-08 DIAGNOSIS — G8929 Other chronic pain: Secondary | ICD-10-CM | POA: Diagnosis not present

## 2019-05-08 DIAGNOSIS — M7542 Impingement syndrome of left shoulder: Secondary | ICD-10-CM | POA: Diagnosis not present

## 2019-05-08 DIAGNOSIS — M546 Pain in thoracic spine: Secondary | ICD-10-CM

## 2019-05-08 NOTE — Progress Notes (Signed)
Medication Samples have been provided to the patient.  Drug name: Rayos       Strength: 2mg         Qty: 1 Box  LOT: [id number]  Exp.Date: 12/2019   Dosing instructions: Take 2 tablets at bedtime.  The patient has been instructed regarding the correct time, dose, and frequency of taking this medication, including desired effects and most common side effects.   Sherrie George, [place] 3:22 PM 05/08/2019

## 2019-05-08 NOTE — Progress Notes (Signed)
[name] - 43 y.o. female MRN [id number]  Date of birth: Feb 06, 1976  SUBJECTIVE:  Including CC & ROS.  Chief Complaint  Patient presents with  . Follow-up    follow up for upper back    Shirley [name] is a 43 y.o. female that is presenting with left shoulder pain and ongoing upper thoracic pain.  The left shoulder pain is acute on chronic in nature.  She has been undergoing physical therapy for the shoulder and the back.  The shoulder seems to be worse with certain abduction exercises.  She denies any radicular symptoms.  This pain can be sharp and severe.  It is worse when she lies on the affected side.  Denies any history of surgery in the shoulder.  The upper back has improved somewhat.  She rates it as mild in nature.  It is located between her shoulder blades.    Review of Systems  Constitutional: Negative for fever.  HENT: Negative for congestion.   Respiratory: Negative for cough.   Cardiovascular: Negative for chest pain.  Gastrointestinal: Negative for abdominal distention.  Musculoskeletal: Positive for back pain.  Skin: Negative for color change.  Neurological: Negative for weakness.  Hematological: Negative for adenopathy.    HISTORY: Past Medical, Surgical, Social, and Family History Reviewed & Updated per EMR.   Pertinent Historical Findings include:  Past Medical History:  Diagnosis Date  . ADHD (attention deficit hyperactivity disorder)   . ALLERGIC RHINITIS 06/26/2008  . ANEMIA-IRON DEFICIENCY 06/26/2008  . 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  . ANKLE PAIN, LEFT 06/26/2008  . BACK PAIN, LUMBAR 10/15/2007  . BIPOLAR DISORDER UNSPECIFIED 09/13/2007  . CERVICAL RADICULOPATHY, LEFT 06/26/2008  . Cervicalgia 10/15/2007  . Chronic pain syndrome 03/21/2011  . COMMON MIGRAINE 06/26/2008  . DIABETES MELLITUS, TYPE II 06/05/2007  . DYSPNEA 12/05/2007  . EAR PAIN, RIGHT 12/05/2007  . ELEVATED BP READING WITHOUT DX HYPERTENSION 07/29/2009  . FATIGUE 12/05/2007  . FIBROMYALGIA 06/26/2008   . GASTROENTERITIS, ACUTE 12/15/2008  . GERD 06/26/2008  . Headache(784.0) 09/13/2007  . HYPERLIPIDEMIA 09/13/2007  . JOINT EFFUSION, RIGHT KNEE 07/29/2009  . KNEE PAIN, LEFT 11/21/2010  . LUMBAR RADICULOPATHY, LEFT 06/26/2008  . OTHER DISEASE OF PHARYNX OR NASOPHARYNX 07/03/2008  . PERIPHERAL EDEMA 01/06/2009  . POLYARTHRITIS 11/21/2010  . SHOULDER PAIN, BILATERAL 07/03/2008  . SINUSITIS- ACUTE-NOS 07/29/2009  . TACHYCARDIA 12/05/2007  . THYROID NODULE, RIGHT 11/20/2008  . TREMOR 01/02/2008  . Vertigo     Past Surgical History:  Procedure Laterality Date  . back surgury     lumbar 2001  . CESAREAN SECTION     x 3  . thyroid fine needle aspiration  May 2008   showed non neoplastic goiter  . VAGINAL HYSTERECTOMY     fibroids    Allergies  Allergen Reactions  . Sulfa Antibiotics Anaphylaxis  . Contrast Media [Iodinated Diagnostic Agents] Itching    Mri, severe heaving ~ can take benadryl without reaction  . Promethazine Hcl     Iv dose with benadryl causes severe aggrivation  . Reglan [Metoclopramide]     "It makes me crazy"    Family History  Problem Relation Age of Onset  . Thyroid disease Mother   . Diabetes Mother   . Asthma Mother   . Hypertension Father   . Hyperlipidemia Father   . Diabetes Father   . Emphysema Other   . Coronary artery disease Other   . Cancer Other        pancreatic and breast  cancer  . Cancer Other        lung and prostate cancer  . Breast cancer Neg Hx      Social History   Socioeconomic History  . Marital status: Significant Other    Spouse name: Not on file  . Number of children: 3  . Years of education: Not on file  . Highest education level: Not on file  Occupational History    Employer: UNEMPLOYED  Social Needs  . Financial resource strain: Not on file  . Food insecurity    Worry: Not on file    Inability: Not on file  . Transportation needs    Medical: Not on file    Non-medical: Not on file  Tobacco Use  . Smoking  status: Never Smoker  . Smokeless tobacco: Never Used  Substance and Sexual Activity  . Alcohol use: No  . Drug use: No  . Sexual activity: Not on file  Lifestyle  . Physical activity    Days per week: Not on file    Minutes per session: Not on file  . Stress: Not on file  Relationships  . Social Herbalist on phone: Not on file    Gets together: Not on file    Attends religious service: Not on file    Active member of club or organization: Not on file    Attends meetings of clubs or organizations: Not on file    Relationship status: Not on file  . Intimate partner violence    Fear of current or ex partner: Not on file    Emotionally abused: Not on file    Physically abused: Not on file    Forced sexual activity: Not on file  Other Topics Concern  . Not on file  Social History Narrative  . Not on file     PHYSICAL EXAM:  VS: BP 107/79   Pulse (!) 105   Ht 5\' 4"  (1.626 m)   BMI 34.33 kg/m  Physical Exam Gen: NAD, alert, cooperative with exam, well-appearing ENT: normal lips, normal nasal mucosa,  Eye: normal EOM, normal conjunctiva and lids CV:  no edema, +2 pedal pulses   Resp: no accessory muscle use, non-labored,  Skin: no rashes, no areas of induration  Neuro: normal tone, normal sensation to touch Psych:  normal insight, alert and oriented MSK:  Back: Mild tenderness to palpation of the paraspinal thoracic muscles. No winging of the scapula. Normal neck range of motion. Left shoulder. Normal active flexion abduction. Normal strength resistance with internal and external rotation. Normal external rotation. Some pain with empty can testing. Some pain with Hawkins testing. Neurovascular intact     ASSESSMENT & PLAN:   Thoracic back pain Likely combination of spasm myofascial pain -Continue physical therapy. -Counseled on home exercise therapy and supportive care. -If no improvement will consider trigger point injections and imaging.   Shoulder impingement syndrome, left Pain is acute on chronic in nature.  Has worsening as of late.  Likely has some irritation of the rotator cuff and some impingement -Provided Rayos samples. -Counseled on home exercise therapy and supportive care. -If no improvement consider injection.

## 2019-05-08 NOTE — Assessment & Plan Note (Signed)
Likely combination of spasm myofascial pain -Continue physical therapy. -Counseled on home exercise therapy and supportive care. -If no improvement will consider trigger point injections and imaging.

## 2019-05-08 NOTE — Assessment & Plan Note (Signed)
Pain is acute on chronic in nature.  Has worsening as of late.  Likely has some irritation of the rotator cuff and some impingement -Provided Rayos samples. -Counseled on home exercise therapy and supportive care. -If no improvement consider injection.

## 2019-05-08 NOTE — Progress Notes (Signed)
Medication Samples have been provided to the patient.  Drug name: Rayos    Strength: 5mg         Qty: 1 Box  LOT: [id number]  Exp.Date: 02/2020  Dosing instructions: Take 2 tablets at bedtime.  The patient has been instructed regarding the correct time, dose, and frequency of taking this medication, including desired effects and most common side effects.   Sherrie George, MA 3:21 PM 05/08/2019

## 2019-05-08 NOTE — Patient Instructions (Signed)
Nice to meet you Please try the rayos  Please try the exercises  Please try heat on the back   Please send me a message in MyChart with any questions or updates.  Please see me back in 4 weeks or sooner if you would like to try the injections.   --Dr. Raeford Razor

## 2019-05-09 ENCOUNTER — Encounter: Payer: Self-pay | Admitting: Physical Therapy

## 2019-05-09 ENCOUNTER — Ambulatory Visit: Payer: BC Managed Care – PPO | Admitting: Physical Therapy

## 2019-05-09 DIAGNOSIS — M25612 Stiffness of left shoulder, not elsewhere classified: Secondary | ICD-10-CM

## 2019-05-09 DIAGNOSIS — M25512 Pain in left shoulder: Secondary | ICD-10-CM

## 2019-05-09 DIAGNOSIS — M6281 Muscle weakness (generalized): Secondary | ICD-10-CM | POA: Diagnosis not present

## 2019-05-09 NOTE — Therapy (Signed)
[facility] [address]  [place], [place], [address] Phone: [phone]   Fax:  [phone]  Physical Therapy Treatment  Patient Details  Name: Shirley [name] MRN: [id number] Date of Birth: 1976-03-04 Referring Provider (PT): Karlton Lemon, MD   Encounter Date: 05/09/2019  PT End of Session - 05/09/19 1111    Visit Number  2    Number of Visits  4    Date for PT Re-Evaluation  05/19/19    Authorization Type  Medicaid    Authorization Time Period  3 visits from 07/17 - 07/27    Authorization - Visit Number  1    Authorization - Number of Visits  3    PT Start Time  0845    PT Stop Time  0929    PT Time Calculation (min)  44 min    Activity Tolerance  Patient tolerated treatment well;Patient limited by pain    Behavior During Therapy  [facility] for tasks assessed/performed       Past Medical History:  Diagnosis Date  . ADHD (attention deficit hyperactivity disorder)   . ALLERGIC RHINITIS 06/26/2008  . ANEMIA-IRON DEFICIENCY 06/26/2008  . 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  . ANKLE PAIN, LEFT 06/26/2008  . BACK PAIN, LUMBAR 10/15/2007  . BIPOLAR DISORDER UNSPECIFIED 09/13/2007  . CERVICAL RADICULOPATHY, LEFT 06/26/2008  . Cervicalgia 10/15/2007  . Chronic pain syndrome 03/21/2011  . COMMON MIGRAINE 06/26/2008  . DIABETES MELLITUS, TYPE II 06/05/2007  . DYSPNEA 12/05/2007  . EAR PAIN, RIGHT 12/05/2007  . ELEVATED BP READING WITHOUT DX HYPERTENSION 07/29/2009  . FATIGUE 12/05/2007  . FIBROMYALGIA 06/26/2008  . GASTROENTERITIS, ACUTE 12/15/2008  . GERD 06/26/2008  . Headache(784.0) 09/13/2007  . HYPERLIPIDEMIA 09/13/2007  . JOINT EFFUSION, RIGHT KNEE 07/29/2009  . KNEE PAIN, LEFT 11/21/2010  . LUMBAR RADICULOPATHY, LEFT 06/26/2008  . OTHER DISEASE OF PHARYNX OR NASOPHARYNX 07/03/2008  . PERIPHERAL EDEMA 01/06/2009  . POLYARTHRITIS 11/21/2010  . SHOULDER PAIN, BILATERAL 07/03/2008  . SINUSITIS- ACUTE-NOS 07/29/2009  . TACHYCARDIA 12/05/2007  .  THYROID NODULE, RIGHT 11/20/2008  . TREMOR 01/02/2008  . Vertigo     Past Surgical History:  Procedure Laterality Date  . back surgury     lumbar 2001  . CESAREAN SECTION     x 3  . thyroid fine needle aspiration  May 2008   showed non neoplastic goiter  . VAGINAL HYSTERECTOMY     fibroids    There were no vitals filed for this visit.  Subjective Assessment - 05/09/19 0847    Subjective  Had an MD appointment yesterday- may be considering shots to shoulder and back. Was given a pill instead.    Pertinent History  vertigo, tremor, tachycardia, B shoulder pain, polyarthritis, peripheral edema, L lumbar radiculopathy, L knee pain, HLD, HA/migraine, GERD, fibromyalgia, DMII, chronic pain syndrome, L cervical radiculopathy, bipolar disorder, L ankle pain, anemia, ADHD, back surgery 2001    Patient Stated Goals  build strength and not have to have surgery    Currently in Pain?  Yes    Pain Score  3     Pain Location  Shoulder    Pain Orientation  Left;Posterior    Pain Descriptors / Indicators  Aching    Pain Type  Acute pain                       [facility] Adult PT Treatment/Exercise - 05/09/19 0001      Self-Care  Self-Care  Other Self-Care Comments    Other Self-Care Comments   edu and practice of self-STM to L rhomboids for pain relief      Exercises   Exercises  Shoulder      Shoulder Exercises: Seated   Flexion  AAROM;Left;10 reps    Flexion Limitations  pball flexion rollouts orange ball 10x3" to tolerance    Abduction  AAROM;Left;10 reps    ABduction Limitations  pball abduction rollouts orange ball 10x3" to tolerance    Other Seated Exercises  scapular retraction 10x3"   cues for shoulder depression     Shoulder Exercises: Standing   External Rotation  Strengthening;Left;10 reps;Theraband    Theraband Level (Shoulder External Rotation)  Level 1 (Yellow)    External Rotation Limitations  ER stepouts with dowel under elbow for neutral positioning     Internal Rotation  Strengthening;Left;10 reps;Theraband    Theraband Level (Shoulder Internal Rotation)  Level 1 (Yellow)    Internal Rotation Limitations  IR stepouts with dowel under elbow for neutral positioning    Row  Strengthening;Both;10 reps;Theraband    Theraband Level (Shoulder Row)  Level 1 (Yellow)    Row Limitations  cues for scap retraction      Shoulder Exercises: Pulleys   Flexion  3 minutes    Flexion Limitations  to tolerance    ABduction  3 minutes    ABduction Limitations  to tolerance      Shoulder Exercises: ROM/Strengthening   Other ROM/Strengthening Exercises  B rhomboid stretch 3x15"      Shoulder Exercises: Stretch   Corner Stretch  2 reps;30 seconds    Corner Stretch Limitations  to tolerance   manual assistance for positioning     Manual Therapy   Manual Therapy  Soft tissue mobilization;Myofascial release    Manual therapy comments  sitting    Soft tissue mobilization  STM to L posterior deltoid, infraspinatus insertion, UT, rhomboids- very tender throughout    Myofascial Release  manual TPR to L rhomboids             PT Education - 05/09/19 1111    Education Details  update to HEP; administered yellow TB    Person(s) Educated  Patient    Methods  Explanation;Demonstration;Tactile cues;Verbal cues;Handout    Comprehension  Verbalized understanding;Returned demonstration       PT Short Term Goals - 05/09/19 1021      PT SHORT TERM GOAL #1   Title  Patient to be independent with initial HEP.    Time  1    Period  Weeks    Status  On-going    Target Date  05/05/19        PT Long Term Goals - 05/09/19 1021      PT LONG TERM GOAL #1   Title  Patient to be independent with advanced HEP.    Time  3    Period  Weeks    Status  On-going      PT LONG TERM GOAL #2   Title  Patient to demonstrate L shoulder AROM WFL and without pain limiting.    Time  3    Period  Weeks    Status  On-going      PT LONG TERM GOAL #3   Title   Patient to demonstrate L shoulder strength >=4+/5.    Time  3    Period  Weeks    Status  On-going      PT LONG  TERM GOAL #4   Title  Patient to report tolerance of sleeping in L sidelying for 4 hours without increase in pain.    Time  3    Period  Weeks    Status  On-going            Plan - 05/09/19 1021    Clinical Impression Statement  Patient arrived to session with report of persisting L shoulder pain, especially with L sidelying. Reviewed HEP with cues for proper form- patient requiring cues for shoulder depression. Patient still reporting most pain with abduction positioning in L shoulder. Introduced isometric L IR/ER stepouts with cues for shoulder depression. Patient tolerated this exercise well. Ended session with STM and manual TPR to L posterior deltoid, infraspinatus insertion, UT, rhomboids- patient reporting significant tenderness throughout. Educated patient on self-STM with ball for continued relief at home. Patient reported understanding of all edu provided today and with no complaints at end of session.    Comorbidities  vertigo, tremor, tachycardia, B shoulder pain, polyarthritis, peripheral edema, L lumbar radiculopathy, L knee pain, HLD, HA/migraine, GERD, fibromyalgia, DMII, chronic pain syndrome, L cervical radiculopathy, bipolar disorder, L ankle pain, anemia, ADHD, back surgery 2001    PT Treatment/Interventions  ADLs/Self Care Home Management;Cryotherapy;Electrical Stimulation;Iontophoresis 4mg /ml Dexamethasone;Moist Heat;Therapeutic exercise;Therapeutic activities;Functional mobility training;Ultrasound;Neuromuscular re-education;Patient/family education;Manual techniques;Vasopneumatic Device;Taping;Splinting;Energy conservation;Dry needling;Passive range of motion    PT Next Visit Plan  STM and strengthening    Consulted and Agree with Plan of Care  Patient       Patient will benefit from skilled therapeutic intervention in order to improve the following  deficits and impairments:  Hypomobility, Increased edema, Decreased activity tolerance, Decreased strength, Increased fascial restricitons, Impaired UE functional use, Pain, Increased muscle spasms, Improper body mechanics, Decreased range of motion, Impaired flexibility, Postural dysfunction  Visit Diagnosis: 1. Acute pain of left shoulder   2. Stiffness of left shoulder, not elsewhere classified   3. Muscle weakness (generalized)        Problem List Patient Active Problem List   Diagnosis Date Noted  . Right foot injury, initial encounter 02/08/2018  . Bilateral knee pain 01/14/2018  . MRSA (methicillin resistant staph aureus) urine culture positive on 07/06/16 07/06/2016  . Foot injury 12/20/2015  . Thoracic back pain 12/20/2015  . Low back pain radiating to right leg 12/01/2015  . Shoulder impingement syndrome, left 11/07/2013  . Left flank pain 11/07/2013  . Encounter for long-term (current) use of high-risk medication 05/29/2011  . Preventative health care 05/28/2011  . Chronic pain syndrome 03/21/2011  . KNEE PAIN, LEFT 11/21/2010  . POLYARTHRITIS 11/21/2010  . BACK PAIN 11/22/2009  . JOINT EFFUSION, RIGHT KNEE 07/29/2009  . ELEVATED BP READING WITHOUT DX HYPERTENSION 07/29/2009  . 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  . PERIPHERAL EDEMA 01/06/2009  . GASTROENTERITIS, ACUTE 12/15/2008  . DYSPHAGIA UNSPECIFIED 11/23/2008  . THYROID NODULE, RIGHT 11/20/2008  . SHOULDER PAIN, BILATERAL 07/03/2008  . ANEMIA-IRON DEFICIENCY 06/26/2008  . COMMON MIGRAINE 06/26/2008  . ALLERGIC RHINITIS 06/26/2008  . GERD 06/26/2008  . ANKLE PAIN, LEFT 06/26/2008  . CERVICAL RADICULOPATHY, LEFT 06/26/2008  . LUMBAR RADICULOPATHY, LEFT 06/26/2008  . FIBROMYALGIA 06/26/2008  . TREMOR 01/02/2008  . EAR PAIN, RIGHT 12/05/2007  . FATIGUE 12/05/2007  . TACHYCARDIA 12/05/2007  . DYSPNEA 12/05/2007  . Neck pain 10/15/2007  . BACK PAIN, LUMBAR 10/15/2007  . Pain in Soft Tissues of Limb 10/15/2007  .  Hyperlipidemia 09/13/2007  . BIPOLAR DISORDER UNSPECIFIED 09/13/2007  . Headache(784.0) 09/13/2007  . Diabetes ([place])  06/05/2007     Janene Harvey, PT, DPT 05/09/19 11:18 AM    [facility] [address]  [place], [place], [address] Phone: [phone]   Fax:  [phone]  Name: IZELLA YBANEZ MRN: [id number] Date of Birth: [birth year]

## 2019-05-16 ENCOUNTER — Other Ambulatory Visit: Payer: Self-pay

## 2019-05-16 ENCOUNTER — Encounter: Payer: Self-pay | Admitting: Physical Therapy

## 2019-05-16 ENCOUNTER — Ambulatory Visit: Payer: BC Managed Care – PPO | Admitting: Physical Therapy

## 2019-05-16 DIAGNOSIS — M25512 Pain in left shoulder: Secondary | ICD-10-CM

## 2019-05-16 DIAGNOSIS — M6281 Muscle weakness (generalized): Secondary | ICD-10-CM

## 2019-05-16 DIAGNOSIS — M25612 Stiffness of left shoulder, not elsewhere classified: Secondary | ICD-10-CM

## 2019-05-16 NOTE — Therapy (Signed)
[facility] [address]  [place], [place], [address] Phone: [phone]   Fax:  [phone]  Physical Therapy Treatment  Patient Details  Name: Shirley [name] MRN: [id number] Date of Birth: [birth year] Referring Provider (PT): Karlton Lemon, MD   Encounter Date: 05/16/2019  PT End of Session - 05/16/19 0941    Visit Number  3    Number of Visits  4    Date for PT Re-Evaluation  05/19/19    Authorization Type  Medicaid    Authorization Time Period  3 visits from 07/17 - 07/27    Authorization - Visit Number  2    Authorization - Number of Visits  3    PT Start Time  0851    PT Stop Time  0931    PT Time Calculation (min)  40 min    Activity Tolerance  Patient tolerated treatment well;Patient limited by pain    Behavior During Therapy  [facility] for tasks assessed/performed       Past Medical History:  Diagnosis Date  . ADHD (attention deficit hyperactivity disorder)   . ALLERGIC RHINITIS 06/26/2008  . ANEMIA-IRON DEFICIENCY 06/26/2008  . 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  . ANKLE PAIN, LEFT 06/26/2008  . BACK PAIN, LUMBAR 10/15/2007  . BIPOLAR DISORDER UNSPECIFIED 09/13/2007  . CERVICAL RADICULOPATHY, LEFT 06/26/2008  . Cervicalgia 10/15/2007  . Chronic pain syndrome 03/21/2011  . COMMON MIGRAINE 06/26/2008  . DIABETES MELLITUS, TYPE II 06/05/2007  . DYSPNEA 12/05/2007  . EAR PAIN, RIGHT 12/05/2007  . ELEVATED BP READING WITHOUT DX HYPERTENSION 07/29/2009  . FATIGUE 12/05/2007  . FIBROMYALGIA 06/26/2008  . GASTROENTERITIS, ACUTE 12/15/2008  . GERD 06/26/2008  . Headache(784.0) 09/13/2007  . HYPERLIPIDEMIA 09/13/2007  . JOINT EFFUSION, RIGHT KNEE 07/29/2009  . KNEE PAIN, LEFT 11/21/2010  . LUMBAR RADICULOPATHY, LEFT 06/26/2008  . OTHER DISEASE OF PHARYNX OR NASOPHARYNX 07/03/2008  . PERIPHERAL EDEMA 01/06/2009  . POLYARTHRITIS 11/21/2010  . SHOULDER PAIN, BILATERAL 07/03/2008  . SINUSITIS- ACUTE-NOS 07/29/2009  . TACHYCARDIA 12/05/2007  .  THYROID NODULE, RIGHT 11/20/2008  . TREMOR 01/02/2008  . Vertigo     Past Surgical History:  Procedure Laterality Date  . back surgury     lumbar 2001  . CESAREAN SECTION     x 3  . thyroid fine needle aspiration  May 2008   showed non neoplastic goiter  . VAGINAL HYSTERECTOMY     fibroids    There were no vitals filed for this visit.  Subjective Assessment - 05/16/19 0852    Subjective  Reports that she is okay as long as she doesn't lay on her L shoulder.    Pertinent History  vertigo, tremor, tachycardia, B shoulder pain, polyarthritis, peripheral edema, L lumbar radiculopathy, L knee pain, HLD, HA/migraine, GERD, fibromyalgia, DMII, chronic pain syndrome, L cervical radiculopathy, bipolar disorder, L ankle pain, anemia, ADHD, back surgery 2001    Patient Stated Goals  build strength and not have to have surgery    Currently in Pain?  Yes    Pain Score  1     Pain Location  Shoulder    Pain Orientation  Left    Pain Descriptors / Indicators  Aching    Pain Type  Acute pain                       [facility] Adult PT Treatment/Exercise - 05/16/19 0001      Shoulder Exercises: Supine  Protraction  Strengthening;Both;15 reps;Weights    Protraction Weight (lbs)  3    Protraction Limitations  2x15; cues for straight elbows    Horizontal ABduction  Strengthening;Both;10 reps;Theraband    Theraband Level (Shoulder Horizontal ABduction)  Level 1 (Yellow)    Horizontal ABduction Weight (lbs)  2x10; cues for scap retraction    External Rotation  Strengthening;Both;10 reps;Theraband    Theraband Level (Shoulder External Rotation)  Level 1 (Yellow)    External Rotation Limitations  2x10; cues for elbows in      Shoulder Exercises: Sidelying   External Rotation  Strengthening;Left;10 reps    External Rotation Limitations  with elbow at side; 1st set 0#; 2nd set 2#   cues for slight scap retraction     Shoulder Exercises: Standing   External Rotation   Strengthening;Left;10 reps;Theraband    Theraband Level (Shoulder External Rotation)  Level 1 (Yellow)    External Rotation Limitations  cues to move within free ROM    Internal Rotation  Strengthening;Left;10 reps;Theraband    Theraband Level (Shoulder Internal Rotation)  Level 1 (Yellow)    Internal Rotation Limitations  cues to move within free ROM    Row  Strengthening;Both;10 reps;Theraband    Theraband Level (Shoulder Row)  Level 2 (Red)    Row Limitations  cues for scap depression      Shoulder Exercises: Pulleys   Flexion  3 minutes    Flexion Limitations  to tolerance    ABduction  3 minutes    ABduction Limitations  to tolerance      Manual Therapy   Manual Therapy  Soft tissue mobilization;Myofascial release    Manual therapy comments  sitting    Soft tissue mobilization  STM to L UT, LS, scalenes, infraspinatus- most tenderness to infraspinatus and UT    Myofascial Release  manual TPR to L UT             PT Education - 05/16/19 0941    Education Details  update to HEP    Person(s) Educated  Patient    Methods  Explanation;Demonstration;Tactile cues;Verbal cues;Handout    Comprehension  Verbalized understanding;Returned demonstration       PT Short Term Goals - 05/16/19 0942      PT SHORT TERM GOAL #1   Title  Patient to be independent with initial HEP.    Time  1    Period  Weeks    Status  Achieved    Target Date  05/05/19        PT Long Term Goals - 05/09/19 1021      PT LONG TERM GOAL #1   Title  Patient to be independent with advanced HEP.    Time  3    Period  Weeks    Status  On-going      PT LONG TERM GOAL #2   Title  Patient to demonstrate L shoulder AROM WFL and without pain limiting.    Time  3    Period  Weeks    Status  On-going      PT LONG TERM GOAL #3   Title  Patient to demonstrate L shoulder strength >=4+/5.    Time  3    Period  Weeks    Status  On-going      PT LONG TERM GOAL #4   Title  Patient to report tolerance  of sleeping in L sidelying for 4 hours without increase in pain.    Time  3  Period  Weeks    Status  On-going            Plan - 05/16/19 3244    Clinical Impression Statement  Patient arrived to session with report of L sidelying as only aggravating factor for L shoulder. Reported "great relief" with STM last session. Worked on supine periscapular strengthening with light banded and weighted resistance. Patient reporting good tolerance with these exercises, but reporting difficulty and weakness. Patient requiring only minimal cues to correct form with scapular rows, thus administered red band. Ended session with STM and TPR to L UT, LS, scalenes, infraspinatus- patient still with most tenderness in the infraspinatus. Patient reported understanding of all edu given today and with no complaints at end of session.    Comorbidities  vertigo, tremor, tachycardia, B shoulder pain, polyarthritis, peripheral edema, L lumbar radiculopathy, L knee pain, HLD, HA/migraine, GERD, fibromyalgia, DMII, chronic pain syndrome, L cervical radiculopathy, bipolar disorder, L ankle pain, anemia, ADHD, back surgery 2001    PT Treatment/Interventions  ADLs/Self Care Home Management;Cryotherapy;Electrical Stimulation;Iontophoresis 4mg /ml Dexamethasone;Moist Heat;Therapeutic exercise;Therapeutic activities;Functional mobility training;Ultrasound;Neuromuscular re-education;Patient/family education;Manual techniques;Vasopneumatic Device;Taping;Splinting;Energy conservation;Dry needling;Passive range of motion    PT Next Visit Plan  STM and strengthening    Consulted and Agree with Plan of Care  Patient       Patient will benefit from skilled therapeutic intervention in order to improve the following deficits and impairments:  Hypomobility, Increased edema, Decreased activity tolerance, Decreased strength, Increased fascial restricitons, Impaired UE functional use, Pain, Increased muscle spasms, Improper body mechanics,  Decreased range of motion, Impaired flexibility, Postural dysfunction  Visit Diagnosis: 1. Acute pain of left shoulder   2. Stiffness of left shoulder, not elsewhere classified   3. Muscle weakness (generalized)        Problem List Patient Active Problem List   Diagnosis Date Noted  . Right foot injury, initial encounter 02/08/2018  . Bilateral knee pain 01/14/2018  . MRSA (methicillin resistant staph aureus) urine culture positive on 07/06/16 07/06/2016  . Foot injury 12/20/2015  . Thoracic back pain 12/20/2015  . Low back pain radiating to right leg 12/01/2015  . Shoulder impingement syndrome, left 11/07/2013  . Left flank pain 11/07/2013  . Encounter for long-term (current) use of high-risk medication 05/29/2011  . Preventative health care 05/28/2011  . Chronic pain syndrome 03/21/2011  . KNEE PAIN, LEFT 11/21/2010  . POLYARTHRITIS 11/21/2010  . BACK PAIN 11/22/2009  . JOINT EFFUSION, RIGHT KNEE 07/29/2009  . ELEVATED BP READING WITHOUT DX HYPERTENSION 07/29/2009  . 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  . PERIPHERAL EDEMA 01/06/2009  . GASTROENTERITIS, ACUTE 12/15/2008  . DYSPHAGIA UNSPECIFIED 11/23/2008  . THYROID NODULE, RIGHT 11/20/2008  . SHOULDER PAIN, BILATERAL 07/03/2008  . ANEMIA-IRON DEFICIENCY 06/26/2008  . COMMON MIGRAINE 06/26/2008  . ALLERGIC RHINITIS 06/26/2008  . GERD 06/26/2008  . ANKLE PAIN, LEFT 06/26/2008  . CERVICAL RADICULOPATHY, LEFT 06/26/2008  . LUMBAR RADICULOPATHY, LEFT 06/26/2008  . FIBROMYALGIA 06/26/2008  . TREMOR 01/02/2008  . EAR PAIN, RIGHT 12/05/2007  . FATIGUE 12/05/2007  . TACHYCARDIA 12/05/2007  . DYSPNEA 12/05/2007  . Neck pain 10/15/2007  . BACK PAIN, LUMBAR 10/15/2007  . Pain in Soft Tissues of Limb 10/15/2007  . Hyperlipidemia 09/13/2007  . BIPOLAR DISORDER UNSPECIFIED 09/13/2007  . Headache(784.0) 09/13/2007  . Diabetes (Hand) 06/05/2007     Janene Harvey, PT, DPT 05/16/19 9:43 AM   [facility] [address]  [place], [place], [address] Phone: [phone]  Fax:  [phone]  Name: GEORGENA WEISHEIT MRN: [id number] Date of Birth: January 06, 1976

## 2019-05-22 ENCOUNTER — Ambulatory Visit: Payer: Self-pay

## 2019-05-22 ENCOUNTER — Other Ambulatory Visit: Payer: Self-pay

## 2019-05-22 ENCOUNTER — Ambulatory Visit (INDEPENDENT_AMBULATORY_CARE_PROVIDER_SITE_OTHER): Payer: BC Managed Care – PPO | Admitting: Family Medicine

## 2019-05-22 ENCOUNTER — Encounter: Payer: Self-pay | Admitting: Family Medicine

## 2019-05-22 VITALS — BP 127/85 | HR 99 | Ht 64.0 in | Wt 200.0 lb

## 2019-05-22 DIAGNOSIS — S99922A Unspecified injury of left foot, initial encounter: Secondary | ICD-10-CM

## 2019-05-22 HISTORY — DX: Unspecified injury of left foot, initial encounter: S99.922A

## 2019-05-22 NOTE — Progress Notes (Signed)
Shirley [name] - 43 y.o. female MRN [id number]  Date of birth: 1976/07/30  SUBJECTIVE:  Including CC & ROS.  Chief Complaint  Patient presents with  . Toe Injury    left foot 4th toe    Shirley [name] is a 43 y.o. female that is presenting with acute left toe pain.  In the middle the night last night she stubbed her toe on the bed.  She heard a crunch.  This is most painful at the DIP of the fourth toe on the left foot.  She is having swelling over the area.  She is having pain with ambulation.  The pain is constant and throbbing.  Denies any improvement today.    Review of Systems  Constitutional: Negative for fever.  HENT: Negative for congestion.   Respiratory: Negative for cough.   Cardiovascular: Negative for chest pain.  Gastrointestinal: Negative for abdominal pain.  Musculoskeletal: Positive for gait problem and joint swelling.  Skin: Negative for color change.  Neurological: Negative for weakness.  Hematological: Negative for adenopathy.    HISTORY: Past Medical, Surgical, Social, and Family History Reviewed & Updated per EMR.   Pertinent Historical Findings include:  Past Medical History:  Diagnosis Date  . ADHD (attention deficit hyperactivity disorder)   . ALLERGIC RHINITIS 06/26/2008  . ANEMIA-IRON DEFICIENCY 06/26/2008  . 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  . ANKLE PAIN, LEFT 06/26/2008  . BACK PAIN, LUMBAR 10/15/2007  . BIPOLAR DISORDER UNSPECIFIED 09/13/2007  . CERVICAL RADICULOPATHY, LEFT 06/26/2008  . Cervicalgia 10/15/2007  . Chronic pain syndrome 03/21/2011  . COMMON MIGRAINE 06/26/2008  . DIABETES MELLITUS, TYPE II 06/05/2007  . DYSPNEA 12/05/2007  . EAR PAIN, RIGHT 12/05/2007  . ELEVATED BP READING WITHOUT DX HYPERTENSION 07/29/2009  . FATIGUE 12/05/2007  . FIBROMYALGIA 06/26/2008  . GASTROENTERITIS, ACUTE 12/15/2008  . GERD 06/26/2008  . Headache(784.0) 09/13/2007  . HYPERLIPIDEMIA 09/13/2007  . JOINT EFFUSION, RIGHT KNEE 07/29/2009  . KNEE PAIN, LEFT 11/21/2010  . LUMBAR  RADICULOPATHY, LEFT 06/26/2008  . OTHER DISEASE OF PHARYNX OR NASOPHARYNX 07/03/2008  . PERIPHERAL EDEMA 01/06/2009  . POLYARTHRITIS 11/21/2010  . SHOULDER PAIN, BILATERAL 07/03/2008  . SINUSITIS- ACUTE-NOS 07/29/2009  . TACHYCARDIA 12/05/2007  . THYROID NODULE, RIGHT 11/20/2008  . TREMOR 01/02/2008  . Vertigo     Past Surgical History:  Procedure Laterality Date  . back surgury     lumbar 2001  . CESAREAN SECTION     x 3  . thyroid fine needle aspiration  May 2008   showed non neoplastic goiter  . VAGINAL HYSTERECTOMY     fibroids    Allergies  Allergen Reactions  . Sulfa Antibiotics Anaphylaxis  . Contrast Media [Iodinated Diagnostic Agents] Itching    Mri, severe heaving ~ can take benadryl without reaction  . Promethazine Hcl     Iv dose with benadryl causes severe aggrivation  . Reglan [Metoclopramide]     "It makes me crazy"    Family History  Problem Relation Age of Onset  . Thyroid disease Mother   . Diabetes Mother   . Asthma Mother   . Hypertension Father   . Hyperlipidemia Father   . Diabetes Father   . Emphysema Other   . Coronary artery disease Other   . Cancer Other        pancreatic and breast cancer  . Cancer Other        lung and prostate cancer  . Breast cancer Neg Hx      Social History  Socioeconomic History  . Marital status: Significant Other    Spouse name: Not on file  . Number of children: 3  . Years of education: Not on file  . Highest education level: Not on file  Occupational History    Employer: UNEMPLOYED  Social Needs  . Financial resource strain: Not on file  . Food insecurity    Worry: Not on file    Inability: Not on file  . Transportation needs    Medical: Not on file    Non-medical: Not on file  Tobacco Use  . Smoking status: Never Smoker  . Smokeless tobacco: Never Used  Substance and Sexual Activity  . Alcohol use: No  . Drug use: No  . Sexual activity: Not on file  Lifestyle  . Physical activity    Days per  week: Not on file    Minutes per session: Not on file  . Stress: Not on file  Relationships  . Social Herbalist on phone: Not on file    Gets together: Not on file    Attends religious service: Not on file    Active member of club or organization: Not on file    Attends meetings of clubs or organizations: Not on file    Relationship status: Not on file  . Intimate partner violence    Fear of current or ex partner: Not on file    Emotionally abused: Not on file    Physically abused: Not on file    Forced sexual activity: Not on file  Other Topics Concern  . Not on file  Social History Narrative  . Not on file     PHYSICAL EXAM:  VS: BP 127/85   Pulse 99   Ht 5\' 4"  (1.626 m)   Wt 200 lb (90.7 kg)   BMI 34.33 kg/m  Physical Exam Gen: NAD, alert, cooperative with exam, well-appearing ENT: normal lips, normal nasal mucosa,  Eye: normal EOM, normal conjunctiva and lids CV:  no edema, +2 pedal pulses   Resp: no accessory muscle use, non-labored,  Skin: no rashes, no areas of induration  Neuro: normal tone, normal sensation to touch Psych:  normal insight, alert and oriented MSK:  Left foot: Tenderness to palpation over the DIP joint of the fourth toe. Obvious swelling at the distal end. No ecchymosis. Neurovascular intact  Limited ultrasound: Left foot:  Normal appearing fourth and fifth metatarsal joints. Normal proximal phalange E and middle phalange of the fourth digit. There appears to be a transverse nondisplaced fracture of the distal phalanx.  Summary: Findings suggest a possible nondisplaced transverse distal phalanx fracture.  Ultrasound and interpretation by Clearance Coots, MD      ASSESSMENT & PLAN:   Toe injury, left, initial encounter She hurt her toe on the bed earlier this morning.  Findings on ultrasound suggest a possible fracture. -Counseled on buddy taping. -Counseled on supportive care. -If pain is severe could consider a  postop shoe.

## 2019-05-22 NOTE — Assessment & Plan Note (Signed)
She hurt her toe on the bed earlier this morning.  Findings on ultrasound suggest a possible fracture. -Counseled on buddy taping. -Counseled on supportive care. -If pain is severe could consider a postop shoe.

## 2019-05-22 NOTE — Patient Instructions (Signed)
Good to see you Please buddy tape over the next 3-4 weeks  Please ice  Please wear a hard soled shoe  Please send me a message in MyChart with any questions or updates.  Please see me back in 2 weeks if not better, otherwise you can see [name] in 4 weeks.   --Dr. Raeford Razor

## 2019-05-23 ENCOUNTER — Encounter: Payer: Self-pay | Admitting: Physical Therapy

## 2019-05-23 ENCOUNTER — Ambulatory Visit: Payer: BC Managed Care – PPO | Admitting: Physical Therapy

## 2019-05-23 DIAGNOSIS — M25612 Stiffness of left shoulder, not elsewhere classified: Secondary | ICD-10-CM | POA: Diagnosis not present

## 2019-05-23 DIAGNOSIS — M6281 Muscle weakness (generalized): Secondary | ICD-10-CM

## 2019-05-23 DIAGNOSIS — M25512 Pain in left shoulder: Secondary | ICD-10-CM | POA: Diagnosis not present

## 2019-05-23 NOTE — Therapy (Signed)
[facility] [address]  [place], [place], [address] Phone: [phone]   Fax:  [phone]  Physical Therapy Treatment  Patient Details  Name: Shirley [name] MRN: [id number] Date of Birth: 1976/08/12 Referring Provider (PT): Karlton Lemon, MD   Encounter Date: 05/23/2019  PT End of Session - 05/23/19 1236    Visit Number  4    Number of Visits  10    Date for PT Re-Evaluation  07/04/19    Authorization Type  Medicaid    Authorization Time Period  3 visits from 07/17 - 08/06    Authorization - Visit Number  3    Authorization - Number of Visits  3    PT Start Time  0850    PT Stop Time  0928    PT Time Calculation (min)  38 min    Activity Tolerance  Patient tolerated treatment well;Patient limited by pain    Behavior During Therapy  [facility] for tasks assessed/performed       Past Medical History:  Diagnosis Date  . ADHD (attention deficit hyperactivity disorder)   . ALLERGIC RHINITIS 06/26/2008  . ANEMIA-IRON DEFICIENCY 06/26/2008  . 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  . ANKLE PAIN, LEFT 06/26/2008  . BACK PAIN, LUMBAR 10/15/2007  . BIPOLAR DISORDER UNSPECIFIED 09/13/2007  . CERVICAL RADICULOPATHY, LEFT 06/26/2008  . Cervicalgia 10/15/2007  . Chronic pain syndrome 03/21/2011  . COMMON MIGRAINE 06/26/2008  . DIABETES MELLITUS, TYPE II 06/05/2007  . DYSPNEA 12/05/2007  . EAR PAIN, RIGHT 12/05/2007  . ELEVATED BP READING WITHOUT DX HYPERTENSION 07/29/2009  . FATIGUE 12/05/2007  . FIBROMYALGIA 06/26/2008  . GASTROENTERITIS, ACUTE 12/15/2008  . GERD 06/26/2008  . Headache(784.0) 09/13/2007  . HYPERLIPIDEMIA 09/13/2007  . JOINT EFFUSION, RIGHT KNEE 07/29/2009  . KNEE PAIN, LEFT 11/21/2010  . LUMBAR RADICULOPATHY, LEFT 06/26/2008  . OTHER DISEASE OF PHARYNX OR NASOPHARYNX 07/03/2008  . PERIPHERAL EDEMA 01/06/2009  . POLYARTHRITIS 11/21/2010  . SHOULDER PAIN, BILATERAL 07/03/2008  . SINUSITIS- ACUTE-NOS 07/29/2009  . TACHYCARDIA 12/05/2007  .  THYROID NODULE, RIGHT 11/20/2008  . TREMOR 01/02/2008  . Vertigo     Past Surgical History:  Procedure Laterality Date  . back surgury     lumbar 2001  . CESAREAN SECTION     x 3  . thyroid fine needle aspiration  May 2008   showed non neoplastic goiter  . VAGINAL HYSTERECTOMY     fibroids    There were no vitals filed for this visit.  Subjective Assessment - 05/23/19 0850    Subjective  Reports that she broke her L 4th digit after stubbing it in the dark.Feels like her L shoulder is better. Has been compliant with HEP. Reports 50% improvement in L shoulder, would like to continue working on ROM.    Pertinent History  vertigo, tremor, tachycardia, B shoulder pain, polyarthritis, peripheral edema, L lumbar radiculopathy, L knee pain, HLD, HA/migraine, GERD, fibromyalgia, DMII, chronic pain syndrome, L cervical radiculopathy, bipolar disorder, L ankle pain, anemia, ADHD, back surgery 2001    Patient Stated Goals  build strength and not have to have surgery    Currently in Pain?  Yes    Pain Score  1     Pain Location  Shoulder    Pain Orientation  Left    Pain Descriptors / Indicators  Aching    Multiple Pain Sites  Yes    Pain Score  4    Pain Location  Toe (Comment which  one)   4th   Pain Orientation  Left    Pain Descriptors / Indicators  Throbbing    Pain Type  Acute pain         [facility] PT Assessment - 05/23/19 0001      Assessment   Medical Diagnosis  Upper back pain    Referring Provider (PT)  Karlton Lemon, MD    Onset Date/Surgical Date  01/27/19      AROM   Left Shoulder Flexion  149 Degrees    Left Shoulder ABduction  152 Degrees    Left Shoulder Internal Rotation  --   FIR T7   Left Shoulder External Rotation  --   FER T4     Strength   Left Shoulder Flexion  4+/5    Left Shoulder ABduction  4+/5    Left Shoulder Internal Rotation  4-/5   pain   Left Shoulder External Rotation  4/5                   [facility] Adult PT Treatment/Exercise -  05/23/19 0001      Shoulder Exercises: Seated   Horizontal ABduction  Strengthening;Both;10 reps;Theraband    Theraband Level (Shoulder Horizontal ABduction)  Level 1 (Yellow)    Horizontal ABduction Limitations  2x10; cues to maintain shoulders at 90 deg    External Rotation  Strengthening;Both;10 reps;Theraband    Theraband Level (Shoulder External Rotation)  Level 1 (Yellow)    External Rotation Limitations  2x10; cues for scapular retraction and depression    Abduction  AAROM;Left;10 reps    ABduction Limitations  with wand to tolerance      Shoulder Exercises: Prone   Extension  Strengthening;Left;10 reps    Extension Limitations  prone I over green pball     Horizontal ABduction 1  Strengthening;Left;10 reps    Horizontal ABduction 1 Limitations  prone T over green pball    Other Prone Exercises  L shoulder prone Y over green pball x10   limited ROM     Shoulder Exercises: Pulleys   Flexion  3 minutes    Flexion Limitations  to tolerance    ABduction  3 minutes    ABduction Limitations  to tolerance             PT Education - 05/23/19 1235    Education Details  update/consolidation of HEP; discussion on objective progress with PT    Person(s) Educated  Patient    Methods  Explanation;Demonstration;Tactile cues;Verbal cues;Handout    Comprehension  Verbalized understanding;Returned demonstration       PT Short Term Goals - 05/23/19 0854      PT SHORT TERM GOAL #1   Title  Patient to be independent with initial HEP.    Time  1    Period  Weeks    Status  Achieved    Target Date  05/05/19        PT Long Term Goals - 05/23/19 0854      PT LONG TERM GOAL #1   Title  Patient to be independent with advanced HEP.    Time  6    Period  Weeks    Status  Partially Met   met for current   Target Date  07/04/19      PT LONG TERM GOAL #2   Title  Patient to demonstrate L shoulder AROM WFL and without pain limiting.    Time  6    Period  Weeks  Status   Partially Met   has shown improvements in all planes of L shoulder movement; abduction only remaining limitation   Target Date  07/04/19      PT LONG TERM GOAL #3   Title  Patient to demonstrate L shoulder strength >=4+/5.    Time  6    Period  Weeks    Status  Partially Met   has shown improvements in L shoulder flexion, abduction, ER; most limited in IR   Target Date  07/04/19      PT LONG TERM GOAL #4   Title  Patient to report tolerance of sleeping in L sidelying for 4 hours without increase in pain.    Time  6    Period  Weeks    Status  On-going   tolerating 5 min before onset of pain   Target Date  07/04/19            Plan - 05/23/19 1240    Clinical Impression Statement  Patient arrived to session with 50% improvement in L shoulder since initial eval. Would like to continue working on ROM and L sidelying tolerance. Reports that she is still limited to only 5 minutes of L sidelying before onset of pain. Patient has demonstrated improvements in all planes of L shoulder AROM. Abduction is only remaining ROM limitation. Strength testing revealed improvements in flexion, abduction, and ER. Still most limited and painful with IR. Patient tolerated addition of prone periscapular strengthening, demonstrating limited ROM d/t difficulty and weakness. Reported difficulty with posterior shoulder strengthening with light banded resistance. Updated HEP to include these exercises as they were well tolerated despite difficulty. Ended session with no complaints. Patient is showing good improvement d/t compliance with HEP. Would benefit from continued skilled PT services 1x/week for 6 weeks to address remaining impairments.    Comorbidities  vertigo, tremor, tachycardia, B shoulder pain, polyarthritis, peripheral edema, L lumbar radiculopathy, L knee pain, HLD, HA/migraine, GERD, fibromyalgia, DMII, chronic pain syndrome, L cervical radiculopathy, bipolar disorder, L ankle pain, anemia, ADHD, back  surgery 2001    PT Treatment/Interventions  ADLs/Self Care Home Management;Cryotherapy;Electrical Stimulation;Iontophoresis 56m/ml Dexamethasone;Moist Heat;Therapeutic exercise;Therapeutic activities;Functional mobility training;Ultrasound;Neuromuscular re-education;Patient/family education;Manual techniques;Vasopneumatic Device;Taping;Splinting;Energy conservation;Dry needling;Passive range of motion    PT Next Visit Plan  STM and strengthening    Consulted and Agree with Plan of Care  Patient       Patient will benefit from skilled therapeutic intervention in order to improve the following deficits and impairments:  Hypomobility, Increased edema, Decreased activity tolerance, Decreased strength, Increased fascial restricitons, Impaired UE functional use, Pain, Increased muscle spasms, Improper body mechanics, Decreased range of motion, Impaired flexibility, Postural dysfunction  Visit Diagnosis: 1. Acute pain of left shoulder   2. Stiffness of left shoulder, not elsewhere classified   3. Muscle weakness (generalized)        Problem List Patient Active Problem List   Diagnosis Date Noted  . Toe injury, left, initial encounter 05/22/2019  . Right foot injury, initial encounter 02/08/2018  . Bilateral knee pain 01/14/2018  . MRSA (methicillin resistant staph aureus) urine culture positive on 07/06/16 07/06/2016  . Foot injury 12/20/2015  . Thoracic back pain 12/20/2015  . Low back pain radiating to right leg 12/01/2015  . Shoulder impingement syndrome, left 11/07/2013  . Left flank pain 11/07/2013  . Encounter for long-term (current) use of high-risk medication 05/29/2011  . Preventative health care 05/28/2011  . Chronic pain syndrome 03/21/2011  . KNEE PAIN, LEFT 11/21/2010  .  POLYARTHRITIS 11/21/2010  . BACK PAIN 11/22/2009  . JOINT EFFUSION, RIGHT KNEE 07/29/2009  . ELEVATED BP READING WITHOUT DX HYPERTENSION 07/29/2009  . 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  . PERIPHERAL EDEMA 01/06/2009   . GASTROENTERITIS, ACUTE 12/15/2008  . DYSPHAGIA UNSPECIFIED 11/23/2008  . THYROID NODULE, RIGHT 11/20/2008  . SHOULDER PAIN, BILATERAL 07/03/2008  . ANEMIA-IRON DEFICIENCY 06/26/2008  . COMMON MIGRAINE 06/26/2008  . ALLERGIC RHINITIS 06/26/2008  . GERD 06/26/2008  . ANKLE PAIN, LEFT 06/26/2008  . CERVICAL RADICULOPATHY, LEFT 06/26/2008  . LUMBAR RADICULOPATHY, LEFT 06/26/2008  . FIBROMYALGIA 06/26/2008  . TREMOR 01/02/2008  . EAR PAIN, RIGHT 12/05/2007  . FATIGUE 12/05/2007  . TACHYCARDIA 12/05/2007  . DYSPNEA 12/05/2007  . Neck pain 10/15/2007  . BACK PAIN, LUMBAR 10/15/2007  . Pain in Soft Tissues of Limb 10/15/2007  . Hyperlipidemia 09/13/2007  . BIPOLAR DISORDER UNSPECIFIED 09/13/2007  . Headache(784.0) 09/13/2007  . Diabetes ([place]) 06/05/2007     Janene Harvey, PT, DPT 05/23/19 12:46 PM   [facility] [address]  [place], [place], [address] Phone: [phone]   Fax:  [phone]  Name: MICAELA STITH MRN: [id number] Date of Birth: 1976/01/02

## 2019-05-30 ENCOUNTER — Ambulatory Visit: Payer: BC Managed Care – PPO

## 2019-06-05 ENCOUNTER — Ambulatory Visit: Payer: BC Managed Care – PPO | Admitting: Family Medicine

## 2019-06-05 NOTE — Progress Notes (Deleted)
Shirley [name] - 43 y.o. female MRN [id number]  Date of birth: [birth year]  SUBJECTIVE:  Including CC & ROS.  No chief complaint on file.   Shirley [name] is a 43 y.o. female that is  ***.  ***   Review of Systems  HISTORY: Past Medical, Surgical, Social, and Family History Reviewed & Updated per EMR.   Pertinent Historical Findings include:  Past Medical History:  Diagnosis Date  . ADHD (attention deficit hyperactivity disorder)   . ALLERGIC RHINITIS 06/26/2008  . ANEMIA-IRON DEFICIENCY 06/26/2008  . 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  . ANKLE PAIN, LEFT 06/26/2008  . BACK PAIN, LUMBAR 10/15/2007  . BIPOLAR DISORDER UNSPECIFIED 09/13/2007  . CERVICAL RADICULOPATHY, LEFT 06/26/2008  . Cervicalgia 10/15/2007  . Chronic pain syndrome 03/21/2011  . COMMON MIGRAINE 06/26/2008  . DIABETES MELLITUS, TYPE II 06/05/2007  . DYSPNEA 12/05/2007  . EAR PAIN, RIGHT 12/05/2007  . ELEVATED BP READING WITHOUT DX HYPERTENSION 07/29/2009  . FATIGUE 12/05/2007  . FIBROMYALGIA 06/26/2008  . GASTROENTERITIS, ACUTE 12/15/2008  . GERD 06/26/2008  . Headache(784.0) 09/13/2007  . HYPERLIPIDEMIA 09/13/2007  . JOINT EFFUSION, RIGHT KNEE 07/29/2009  . KNEE PAIN, LEFT 11/21/2010  . LUMBAR RADICULOPATHY, LEFT 06/26/2008  . OTHER DISEASE OF PHARYNX OR NASOPHARYNX 07/03/2008  . PERIPHERAL EDEMA 01/06/2009  . POLYARTHRITIS 11/21/2010  . SHOULDER PAIN, BILATERAL 07/03/2008  . SINUSITIS- ACUTE-NOS 07/29/2009  . TACHYCARDIA 12/05/2007  . THYROID NODULE, RIGHT 11/20/2008  . TREMOR 01/02/2008  . Vertigo     Past Surgical History:  Procedure Laterality Date  . back surgury     lumbar 2001  . CESAREAN SECTION     x 3  . thyroid fine needle aspiration  May 2008   showed non neoplastic goiter  . VAGINAL HYSTERECTOMY     fibroids    Allergies  Allergen Reactions  . Sulfa Antibiotics Anaphylaxis  . Contrast Media [Iodinated Diagnostic Agents] Itching    Mri, severe heaving ~ can take benadryl without reaction  . Promethazine Hcl      Iv dose with benadryl causes severe aggrivation  . Reglan [Metoclopramide]     "It makes me crazy"    Family History  Problem Relation Age of Onset  . Thyroid disease Mother   . Diabetes Mother   . Asthma Mother   . Hypertension Father   . Hyperlipidemia Father   . Diabetes Father   . Emphysema Other   . Coronary artery disease Other   . Cancer Other        pancreatic and breast cancer  . Cancer Other        lung and prostate cancer  . Breast cancer Neg Hx      Social History   Socioeconomic History  . Marital status: Significant Other    Spouse name: Not on file  . Number of children: 3  . Years of education: Not on file  . Highest education level: Not on file  Occupational History    Employer: UNEMPLOYED  Social Needs  . Financial resource strain: Not on file  . Food insecurity    Worry: Not on file    Inability: Not on file  . Transportation needs    Medical: Not on file    Non-medical: Not on file  Tobacco Use  . Smoking status: Never Smoker  . Smokeless tobacco: Never Used  Substance and Sexual Activity  . Alcohol use: No  . Drug use: No  . Sexual activity: Not on file  Lifestyle  .  Physical activity    Days per week: Not on file    Minutes per session: Not on file  . Stress: Not on file  Relationships  . Social Herbalist on phone: Not on file    Gets together: Not on file    Attends religious service: Not on file    Active member of club or organization: Not on file    Attends meetings of clubs or organizations: Not on file    Relationship status: Not on file  . Intimate partner violence    Fear of current or ex partner: Not on file    Emotionally abused: Not on file    Physically abused: Not on file    Forced sexual activity: Not on file  Other Topics Concern  . Not on file  Social History Narrative  . Not on file     PHYSICAL EXAM:  VS: There were no vitals taken for this visit. Physical Exam Gen: NAD, alert,  cooperative with exam, well-appearing ENT: normal lips, normal nasal mucosa,  Eye: normal EOM, normal conjunctiva and lids CV:  no edema, +2 pedal pulses   Resp: no accessory muscle use, non-labored,  GI: no masses or tenderness, no hernia  Skin: no rashes, no areas of induration  Neuro: normal tone, normal sensation to touch Psych:  normal insight, alert and oriented MSK:  ***      ASSESSMENT & PLAN:   No problem-specific Assessment & Plan notes found for this encounter.

## 2019-06-06 ENCOUNTER — Ambulatory Visit: Payer: BC Managed Care – PPO | Admitting: Physical Therapy

## 2019-06-10 ENCOUNTER — Ambulatory Visit (INDEPENDENT_AMBULATORY_CARE_PROVIDER_SITE_OTHER): Payer: BC Managed Care – PPO | Admitting: Family Medicine

## 2019-06-10 ENCOUNTER — Other Ambulatory Visit: Payer: Self-pay

## 2019-06-10 DIAGNOSIS — S99922A Unspecified injury of left foot, initial encounter: Secondary | ICD-10-CM

## 2019-06-10 DIAGNOSIS — M546 Pain in thoracic spine: Secondary | ICD-10-CM | POA: Diagnosis not present

## 2019-06-10 DIAGNOSIS — S99922D Unspecified injury of left foot, subsequent encounter: Secondary | ICD-10-CM | POA: Diagnosis not present

## 2019-06-10 DIAGNOSIS — G8929 Other chronic pain: Secondary | ICD-10-CM | POA: Diagnosis not present

## 2019-06-10 NOTE — Progress Notes (Signed)
Shirley [name] - 43 y.o. female MRN [id number]  Date of birth: [birth year]  SUBJECTIVE:  Including CC & ROS.  No chief complaint on file.   Shirley [name] is a 43 y.o. female that is following up for left toe pain and midthoracic back pain.  She has been going through physical therapy and reports improvement of her symptoms.  They do occur intermittently with it being worse towards the end of her shift.  It is much better than when she first started however.  It can be pinching at times.  Is occurring between her shoulder blades and on the inferior border of the scapulas.  The toe is much better.  It still has pain intermittently.  It is worse if she walks on the affected side.  But much improved compared to previously..   Review of Systems  Constitutional: Negative for fever.  HENT: Negative for congestion.   Respiratory: Negative for cough.   Cardiovascular: Negative for chest pain.  Gastrointestinal: Negative for abdominal pain.  Musculoskeletal: Positive for back pain.  Skin: Negative for color change.  Neurological: Negative for weakness.  Hematological: Negative for adenopathy.    HISTORY: Past Medical, Surgical, Social, and Family History Reviewed & Updated per EMR.   Pertinent Historical Findings include:  Past Medical History:  Diagnosis Date  . ADHD (attention deficit hyperactivity disorder)   . ALLERGIC RHINITIS 06/26/2008  . ANEMIA-IRON DEFICIENCY 06/26/2008  . 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  . ANKLE PAIN, LEFT 06/26/2008  . BACK PAIN, LUMBAR 10/15/2007  . BIPOLAR DISORDER UNSPECIFIED 09/13/2007  . CERVICAL RADICULOPATHY, LEFT 06/26/2008  . Cervicalgia 10/15/2007  . Chronic pain syndrome 03/21/2011  . COMMON MIGRAINE 06/26/2008  . DIABETES MELLITUS, TYPE II 06/05/2007  . DYSPNEA 12/05/2007  . EAR PAIN, RIGHT 12/05/2007  . ELEVATED BP READING WITHOUT DX HYPERTENSION 07/29/2009  . FATIGUE 12/05/2007  . FIBROMYALGIA 06/26/2008  . GASTROENTERITIS, ACUTE 12/15/2008  . GERD 06/26/2008  .  Headache(784.0) 09/13/2007  . HYPERLIPIDEMIA 09/13/2007  . JOINT EFFUSION, RIGHT KNEE 07/29/2009  . KNEE PAIN, LEFT 11/21/2010  . LUMBAR RADICULOPATHY, LEFT 06/26/2008  . OTHER DISEASE OF PHARYNX OR NASOPHARYNX 07/03/2008  . PERIPHERAL EDEMA 01/06/2009  . POLYARTHRITIS 11/21/2010  . SHOULDER PAIN, BILATERAL 07/03/2008  . SINUSITIS- ACUTE-NOS 07/29/2009  . TACHYCARDIA 12/05/2007  . THYROID NODULE, RIGHT 11/20/2008  . TREMOR 01/02/2008  . Vertigo     Past Surgical History:  Procedure Laterality Date  . back surgury     lumbar 2001  . CESAREAN SECTION     x 3  . thyroid fine needle aspiration  May 2008   showed non neoplastic goiter  . VAGINAL HYSTERECTOMY     fibroids    Allergies  Allergen Reactions  . Sulfa Antibiotics Anaphylaxis  . Contrast Media [Iodinated Diagnostic Agents] Itching    Mri, severe heaving ~ can take benadryl without reaction  . Promethazine Hcl     Iv dose with benadryl causes severe aggrivation  . Reglan [Metoclopramide]     "It makes me crazy"    Family History  Problem Relation Age of Onset  . Thyroid disease Mother   . Diabetes Mother   . Asthma Mother   . Hypertension Father   . Hyperlipidemia Father   . Diabetes Father   . Emphysema Other   . Coronary artery disease Other   . Cancer Other        pancreatic and breast cancer  . Cancer Other        lung and  prostate cancer  . Breast cancer Neg Hx      Social History   Socioeconomic History  . Marital status: Significant Other    Spouse name: Not on file  . Number of children: 3  . Years of education: Not on file  . Highest education level: Not on file  Occupational History    Employer: UNEMPLOYED  Social Needs  . Financial resource strain: Not on file  . Food insecurity    Worry: Not on file    Inability: Not on file  . Transportation needs    Medical: Not on file    Non-medical: Not on file  Tobacco Use  . Smoking status: Never Smoker  . Smokeless tobacco: Never Used   Substance and Sexual Activity  . Alcohol use: No  . Drug use: No  . Sexual activity: Not on file  Lifestyle  . Physical activity    Days per week: Not on file    Minutes per session: Not on file  . Stress: Not on file  Relationships  . Social Herbalist on phone: Not on file    Gets together: Not on file    Attends religious service: Not on file    Active member of club or organization: Not on file    Attends meetings of clubs or organizations: Not on file    Relationship status: Not on file  . Intimate partner violence    Fear of current or ex partner: Not on file    Emotionally abused: Not on file    Physically abused: Not on file    Forced sexual activity: Not on file  Other Topics Concern  . Not on file  Social History Narrative  . Not on file     PHYSICAL EXAM:  VS: There were no vitals taken for this visit. Physical Exam Gen: NAD, alert, cooperative with exam, well-appearing ENT: normal lips, normal nasal mucosa,  Eye: normal EOM, normal conjunctiva and lids CV:  no edema, +2 pedal pulses   Resp: no accessory muscle use, non-labored,  Skin: no rashes, no areas of induration  Neuro: normal tone, normal sensation to touch Psych:  normal insight, alert and oriented MSK:  Back: Some mild tenderness to palpation of the left and right paraspinal muscles in the thoracic region.   No winging of the scapula. Normal shoulder range of motion. Left foot: Mild tenderness to palpation over the fourth digit. No significant swelling or ecchymosis. Neurovascular intact      ASSESSMENT & PLAN:   Thoracic back pain Has had improvement of her symptoms with physical therapy.  Still occurs intermittently. -Continue physical therapy. -Counseled on backslash therapy and supportive care. -Discussed trigger point injections but will hold off at this time.  Toe injury, left, initial encounter Having improvement of her symptoms. -Counseled on buddy taping.  -Follow-up as needed.

## 2019-06-10 NOTE — Patient Instructions (Signed)
Good to see you Have a good day  Please continue physical therapy  Please try the thera cane  Please continue to tape your toe  Please send me a message in MyChart with any questions or updates.  Please see me back in 6-8 weeks.   --Dr. Raeford Razor

## 2019-06-10 NOTE — Assessment & Plan Note (Signed)
Has had improvement of her symptoms with physical therapy.  Still occurs intermittently. -Continue physical therapy. -Counseled on backslash therapy and supportive care. -Discussed trigger point injections but will hold off at this time.

## 2019-06-10 NOTE — Assessment & Plan Note (Signed)
Having improvement of her symptoms. -Counseled on buddy taping. -Follow-up as needed.

## 2019-06-11 ENCOUNTER — Ambulatory Visit: Payer: BC Managed Care – PPO | Attending: Family Medicine | Admitting: Physical Therapy

## 2019-06-11 ENCOUNTER — Encounter: Payer: Self-pay | Admitting: Physical Therapy

## 2019-06-11 DIAGNOSIS — M6281 Muscle weakness (generalized): Secondary | ICD-10-CM

## 2019-06-11 DIAGNOSIS — M25612 Stiffness of left shoulder, not elsewhere classified: Secondary | ICD-10-CM | POA: Insufficient documentation

## 2019-06-11 DIAGNOSIS — M25512 Pain in left shoulder: Secondary | ICD-10-CM | POA: Insufficient documentation

## 2019-06-11 NOTE — Therapy (Signed)
[facility] [address]  [place], [place], [address] Phone: [phone]   Fax:  [phone]  Physical Therapy Treatment  Patient Details  Name: Shirley [name] MRN: [id number] Date of Birth: 1976-02-10 Referring Provider (PT): Karlton Lemon, MD   Encounter Date: 06/11/2019  PT End of Session - 06/11/19 1512    Visit Number  5    Number of Visits  10    Date for PT Re-Evaluation  07/04/19    Authorization Type  Medicaid    Authorization Time Period  6 visits 08/07-09/17    Authorization - Visit Number  1    Authorization - Number of Visits  6    PT Start Time  1950    PT Stop Time  9326   pt left early   PT Time Calculation (min)  28 min    Activity Tolerance  Patient tolerated treatment well    Behavior During Therapy  [facility] for tasks assessed/performed       Past Medical History:  Diagnosis Date  . ADHD (attention deficit hyperactivity disorder)   . ALLERGIC RHINITIS 06/26/2008  . ANEMIA-IRON DEFICIENCY 06/26/2008  . 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  . ANKLE PAIN, LEFT 06/26/2008  . BACK PAIN, LUMBAR 10/15/2007  . BIPOLAR DISORDER UNSPECIFIED 09/13/2007  . CERVICAL RADICULOPATHY, LEFT 06/26/2008  . Cervicalgia 10/15/2007  . Chronic pain syndrome 03/21/2011  . COMMON MIGRAINE 06/26/2008  . DIABETES MELLITUS, TYPE II 06/05/2007  . DYSPNEA 12/05/2007  . EAR PAIN, RIGHT 12/05/2007  . ELEVATED BP READING WITHOUT DX HYPERTENSION 07/29/2009  . FATIGUE 12/05/2007  . FIBROMYALGIA 06/26/2008  . GASTROENTERITIS, ACUTE 12/15/2008  . GERD 06/26/2008  . Headache(784.0) 09/13/2007  . HYPERLIPIDEMIA 09/13/2007  . JOINT EFFUSION, RIGHT KNEE 07/29/2009  . KNEE PAIN, LEFT 11/21/2010  . LUMBAR RADICULOPATHY, LEFT 06/26/2008  . OTHER DISEASE OF PHARYNX OR NASOPHARYNX 07/03/2008  . PERIPHERAL EDEMA 01/06/2009  . POLYARTHRITIS 11/21/2010  . SHOULDER PAIN, BILATERAL 07/03/2008  . SINUSITIS- ACUTE-NOS 07/29/2009  . TACHYCARDIA 12/05/2007  . THYROID NODULE,  RIGHT 11/20/2008  . TREMOR 01/02/2008  . Vertigo     Past Surgical History:  Procedure Laterality Date  . back surgury     lumbar 2001  . CESAREAN SECTION     x 3  . thyroid fine needle aspiration  May 2008   showed non neoplastic goiter  . VAGINAL HYSTERECTOMY     fibroids    There were no vitals filed for this visit.  Subjective Assessment - 06/11/19 1448    Subjective  Reports that the shoulder is getting better.    Pertinent History  vertigo, tremor, tachycardia, B shoulder pain, polyarthritis, peripheral edema, L lumbar radiculopathy, L knee pain, HLD, HA/migraine, GERD, fibromyalgia, DMII, chronic pain syndrome, L cervical radiculopathy, bipolar disorder, L ankle pain, anemia, ADHD, back surgery 2001    Patient Stated Goals  build strength and not have to have surgery    Currently in Pain?  No/denies                       [facility] Adult PT Treatment/Exercise - 06/11/19 0001      Shoulder Exercises: Supine   Protraction  Strengthening;Both;15 reps;Weights    Protraction Weight (lbs)  4    Protraction Limitations  2x15; good form      Shoulder Exercises: Sidelying   External Rotation  Strengthening;Left;10 reps    External Rotation Limitations  with elbow at side; 2x10 2#  towel roll under elbow   ABduction  Strengthening;Left;10 reps;Weights    ABduction Weight (lbs)  1    ABduction Limitations  thumb up; cues with control on eccentric phase      Shoulder Exercises: Standing   Horizontal ABduction  Strengthening;Both;10 reps;Theraband    Theraband Level (Shoulder Horizontal ABduction)  Level 1 (Yellow)    Horizontal ABduction Limitations  2x10 in doorframe   cues for positioning   Extension  Strengthening;Both;10 reps;Theraband    Theraband Level (Shoulder Extension)  Level 3 (Green)    Extension Limitations  cues for scap retraction    Row  Strengthening;Both;10 reps;Theraband    Theraband Level (Shoulder Row)  Level 3 (Green)    Row Limitations   good form      Shoulder Exercises: ROM/Strengthening   UBE (Upper Arm Bike)  L1 x 108mn forward/3 min back      Shoulder Exercises: Stretch   Corner Stretch  2 reps;30 seconds    Corner Stretch Limitations  to tolerance   cues to maintain position for full time              PT Short Term Goals - 05/23/19 0854      PT SHORT TERM GOAL #1   Title  Patient to be independent with initial HEP.    Time  1    Period  Weeks    Status  Achieved    Target Date  05/05/19        PT Long Term Goals - 05/23/19 0854      PT LONG TERM GOAL #1   Title  Patient to be independent with advanced HEP.    Time  6    Period  Weeks    Status  Partially Met   met for current   Target Date  07/04/19      PT LONG TERM GOAL #2   Title  Patient to demonstrate L shoulder AROM WFL and without pain limiting.    Time  6    Period  Weeks    Status  Partially Met   has shown improvements in all planes of L shoulder movement; abduction only remaining limitation   Target Date  07/04/19      PT LONG TERM GOAL #3   Title  Patient to demonstrate L shoulder strength >=4+/5.    Time  6    Period  Weeks    Status  Partially Met   has shown improvements in L shoulder flexion, abduction, ER; most limited in IR   Target Date  07/04/19      PT LONG TERM GOAL #4   Title  Patient to report tolerance of sleeping in L sidelying for 4 hours without increase in pain.    Time  6    Period  Weeks    Status  On-going   tolerating 5 min before onset of pain   Target Date  07/04/19            Plan - 06/11/19 1517    Clinical Impression Statement  Patient reporting improvement in L shoulder pain since last session. Worked on sidelying shoulder strengthening with patient demonstrating good form and effort throughout. Able to tolerate increased weighted resistance with serratus punches and improved performance from last session. Overall with much improved form and tolerance with ther-ex today. Patient  requested to leave session early d/t another appointment. Reported feeling good after session.    Comorbidities  vertigo, tremor, tachycardia, B shoulder pain,  polyarthritis, peripheral edema, L lumbar radiculopathy, L knee pain, HLD, HA/migraine, GERD, fibromyalgia, DMII, chronic pain syndrome, L cervical radiculopathy, bipolar disorder, L ankle pain, anemia, ADHD, back surgery 2001    PT Treatment/Interventions  ADLs/Self Care Home Management;Cryotherapy;Electrical Stimulation;Iontophoresis 84m/ml Dexamethasone;Moist Heat;Therapeutic exercise;Therapeutic activities;Functional mobility training;Ultrasound;Neuromuscular re-education;Patient/family education;Manual techniques;Vasopneumatic Device;Taping;Splinting;Energy conservation;Dry needling;Passive range of motion    PT Next Visit Plan  STM and strengthening    Consulted and Agree with Plan of Care  Patient       Patient will benefit from skilled therapeutic intervention in order to improve the following deficits and impairments:  Hypomobility, Increased edema, Decreased activity tolerance, Decreased strength, Increased fascial restricitons, Impaired UE functional use, Pain, Increased muscle spasms, Improper body mechanics, Decreased range of motion, Impaired flexibility, Postural dysfunction  Visit Diagnosis: 1. Acute pain of left shoulder   2. Stiffness of left shoulder, not elsewhere classified   3. Muscle weakness (generalized)        Problem List Patient Active Problem List   Diagnosis Date Noted  . Toe injury, left, initial encounter 05/22/2019  . Right foot injury, initial encounter 02/08/2018  . Bilateral knee pain 01/14/2018  . MRSA (methicillin resistant staph aureus) urine culture positive on 07/06/16 07/06/2016  . Foot injury 12/20/2015  . Thoracic back pain 12/20/2015  . Low back pain radiating to right leg 12/01/2015  . Shoulder impingement syndrome, left 11/07/2013  . Left flank pain 11/07/2013  . Encounter for  long-term (current) use of high-risk medication 05/29/2011  . Preventative health care 05/28/2011  . Chronic pain syndrome 03/21/2011  . KNEE PAIN, LEFT 11/21/2010  . POLYARTHRITIS 11/21/2010  . BACK PAIN 11/22/2009  . JOINT EFFUSION, RIGHT KNEE 07/29/2009  . ELEVATED BP READING WITHOUT DX HYPERTENSION 07/29/2009  . 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  . PERIPHERAL EDEMA 01/06/2009  . GASTROENTERITIS, ACUTE 12/15/2008  . DYSPHAGIA UNSPECIFIED 11/23/2008  . THYROID NODULE, RIGHT 11/20/2008  . SHOULDER PAIN, BILATERAL 07/03/2008  . ANEMIA-IRON DEFICIENCY 06/26/2008  . COMMON MIGRAINE 06/26/2008  . ALLERGIC RHINITIS 06/26/2008  . GERD 06/26/2008  . ANKLE PAIN, LEFT 06/26/2008  . CERVICAL RADICULOPATHY, LEFT 06/26/2008  . LUMBAR RADICULOPATHY, LEFT 06/26/2008  . FIBROMYALGIA 06/26/2008  . TREMOR 01/02/2008  . EAR PAIN, RIGHT 12/05/2007  . FATIGUE 12/05/2007  . TACHYCARDIA 12/05/2007  . DYSPNEA 12/05/2007  . Neck pain 10/15/2007  . BACK PAIN, LUMBAR 10/15/2007  . Pain in Soft Tissues of Limb 10/15/2007  . Hyperlipidemia 09/13/2007  . BIPOLAR DISORDER UNSPECIFIED 09/13/2007  . Headache(784.0) 09/13/2007  . [place] 06/05/2007     YJanene Harvey PT, DPT 06/11/19 3:19 PM   [facility] [address] [place] [address]Phone: [phone]  Fax:  [phone] Name: PEMMALENA CANNYMRN: [id number] of Birth: [birth year]

## 2019-06-16 ENCOUNTER — Other Ambulatory Visit: Payer: Self-pay

## 2019-06-16 ENCOUNTER — Ambulatory Visit (INDEPENDENT_AMBULATORY_CARE_PROVIDER_SITE_OTHER): Payer: BC Managed Care – PPO | Admitting: Family Medicine

## 2019-06-16 ENCOUNTER — Encounter: Payer: Self-pay | Admitting: Family Medicine

## 2019-06-16 VITALS — BP 144/90 | Ht 64.0 in | Wt 200.0 lb

## 2019-06-16 DIAGNOSIS — M546 Pain in thoracic spine: Secondary | ICD-10-CM | POA: Diagnosis not present

## 2019-06-16 DIAGNOSIS — G8929 Other chronic pain: Secondary | ICD-10-CM | POA: Diagnosis not present

## 2019-06-16 MED ORDER — PREDNISONE 5 MG PO TABS: ORAL_TABLET | ORAL | 0 refills | Status: DC

## 2019-06-16 MED ORDER — KETOROLAC TROMETHAMINE 30 MG/ML IJ SOLN
30.0000 mg | Freq: Once | INTRAMUSCULAR | Status: AC
  Administered 2019-06-16: 16:00:00 30 mg via INTRAMUSCULAR

## 2019-06-16 NOTE — Patient Instructions (Signed)
Good to see you Please try heat on the back  Please continue with the exercises  Please try the muscle relaxer at night  Please send me a message in MyChart with any questions or updates.  Please see me back as schedule or sooner if needed.   --Dr. Raeford Razor

## 2019-06-16 NOTE — Assessment & Plan Note (Signed)
Appears that she has an acute exacerbation.  Seems that she must have a spasm.  Seems to have a new component of radicular symptoms on the right leg.  No deficits on exam. -IM Toradol. -Counseled on home exercise therapy and supportive care. -Can continue with physical therapy as she tolerates. -May need consider an MRI at some point.

## 2019-06-16 NOTE — Progress Notes (Signed)
Shirley [name] - 43 y.o. female MRN [id number]  Date of birth: [birth year]  SUBJECTIVE:  Including CC & ROS.  Chief Complaint  Patient presents with  . Back Pain    right-sided upper back    Shirley [name] is a 43 y.o. female that is presenting with acute exacerbation of her thoracic back pain with some radicular symptoms down the right side.  The symptoms started in about a day duration.  They are occurring in the back and down the right lower leg.  She denies any specific inciting event.  She cannot take muscle relaxers at work.  It is sharp and constant.  The pain is severe.  She denies any trauma.    Review of Systems  Constitutional: Negative for fever.  HENT: Negative for congestion.   Respiratory: Negative for cough.   Cardiovascular: Negative for chest pain.  Gastrointestinal: Negative for abdominal pain.  Musculoskeletal: Positive for back pain.  Skin: Negative for color change.  Neurological: Negative for weakness.  Hematological: Negative for adenopathy.    HISTORY: Past Medical, Surgical, Social, and Family History Reviewed & Updated per EMR.   Pertinent Historical Findings include:  Past Medical History:  Diagnosis Date  . ADHD (attention deficit hyperactivity disorder)   . ALLERGIC RHINITIS 06/26/2008  . ANEMIA-IRON DEFICIENCY 06/26/2008  . 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  . ANKLE PAIN, LEFT 06/26/2008  . BACK PAIN, LUMBAR 10/15/2007  . BIPOLAR DISORDER UNSPECIFIED 09/13/2007  . CERVICAL RADICULOPATHY, LEFT 06/26/2008  . Cervicalgia 10/15/2007  . Chronic pain syndrome 03/21/2011  . COMMON MIGRAINE 06/26/2008  . DIABETES MELLITUS, TYPE II 06/05/2007  . DYSPNEA 12/05/2007  . EAR PAIN, RIGHT 12/05/2007  . ELEVATED BP READING WITHOUT DX HYPERTENSION 07/29/2009  . FATIGUE 12/05/2007  . FIBROMYALGIA 06/26/2008  . GASTROENTERITIS, ACUTE 12/15/2008  . GERD 06/26/2008  . Headache(784.0) 09/13/2007  . HYPERLIPIDEMIA 09/13/2007  . JOINT EFFUSION, RIGHT KNEE 07/29/2009  . KNEE PAIN, LEFT  11/21/2010  . LUMBAR RADICULOPATHY, LEFT 06/26/2008  . OTHER DISEASE OF PHARYNX OR NASOPHARYNX 07/03/2008  . PERIPHERAL EDEMA 01/06/2009  . POLYARTHRITIS 11/21/2010  . SHOULDER PAIN, BILATERAL 07/03/2008  . SINUSITIS- ACUTE-NOS 07/29/2009  . TACHYCARDIA 12/05/2007  . THYROID NODULE, RIGHT 11/20/2008  . TREMOR 01/02/2008  . Vertigo     Past Surgical History:  Procedure Laterality Date  . back surgury     lumbar 2001  . CESAREAN SECTION     x 3  . thyroid fine needle aspiration  May 2008   showed non neoplastic goiter  . VAGINAL HYSTERECTOMY     fibroids    Allergies  Allergen Reactions  . Sulfa Antibiotics Anaphylaxis  . Contrast Media [Iodinated Diagnostic Agents] Itching    Mri, severe heaving ~ can take benadryl without reaction  . Promethazine Hcl     Iv dose with benadryl causes severe aggrivation  . Reglan [Metoclopramide]     "It makes me crazy"    Family History  Problem Relation Age of Onset  . Thyroid disease Mother   . Diabetes Mother   . Asthma Mother   . Hypertension Father   . Hyperlipidemia Father   . Diabetes Father   . Emphysema Other   . Coronary artery disease Other   . Cancer Other        pancreatic and breast cancer  . Cancer Other        lung and prostate cancer  . Breast cancer Neg Hx      Social History   Socioeconomic History  .  Marital status: Significant Other    Spouse name: Not on file  . Number of children: 3  . Years of education: Not on file  . Highest education level: Not on file  Occupational History    Employer: UNEMPLOYED  Social Needs  . Financial resource strain: Not on file  . Food insecurity    Worry: Not on file    Inability: Not on file  . Transportation needs    Medical: Not on file    Non-medical: Not on file  Tobacco Use  . Smoking status: Never Smoker  . Smokeless tobacco: Never Used  Substance and Sexual Activity  . Alcohol use: No  . Drug use: No  . Sexual activity: Not on file  Lifestyle  . Physical  activity    Days per week: Not on file    Minutes per session: Not on file  . Stress: Not on file  Relationships  . Social Herbalist on phone: Not on file    Gets together: Not on file    Attends religious service: Not on file    Active member of club or organization: Not on file    Attends meetings of clubs or organizations: Not on file    Relationship status: Not on file  . Intimate partner violence    Fear of current or ex partner: Not on file    Emotionally abused: Not on file    Physically abused: Not on file    Forced sexual activity: Not on file  Other Topics Concern  . Not on file  Social History Narrative  . Not on file     PHYSICAL EXAM:  VS: BP (!) 144/90   Ht 5\' 4"  (1.626 m)   Wt 200 lb (90.7 kg)   BMI 34.33 kg/m  Physical Exam Gen: NAD, alert, cooperative with exam, well-appearing ENT: normal lips, normal nasal mucosa,  Eye: normal EOM, normal conjunctiva and lids CV:  no edema, +2 pedal pulses   Resp: no accessory muscle use, non-labored,  Skin: no rashes, no areas of induration  Neuro: normal tone, normal sensation to touch Psych:  normal insight, alert and oriented MSK:  Back:  TTP along the paraspinal muscles in the thoracic region  Normal shoulder Rom  No winging of the scapula  Normal gait  NVI      ASSESSMENT & PLAN:   Thoracic back pain Appears that she has an acute exacerbation.  Seems that she must have a spasm.  Seems to have a new component of radicular symptoms on the right leg.  No deficits on exam. -IM Toradol. -Counseled on home exercise therapy and supportive care. -Can continue with physical therapy as she tolerates. -May need consider an MRI at some point.

## 2019-06-18 ENCOUNTER — Ambulatory Visit: Payer: BC Managed Care – PPO

## 2019-06-19 ENCOUNTER — Ambulatory Visit: Payer: BC Managed Care – PPO | Admitting: Physical Therapy

## 2019-06-19 ENCOUNTER — Other Ambulatory Visit: Payer: Self-pay

## 2019-06-19 ENCOUNTER — Encounter: Payer: Self-pay | Admitting: Physical Therapy

## 2019-06-19 DIAGNOSIS — M6281 Muscle weakness (generalized): Secondary | ICD-10-CM | POA: Diagnosis not present

## 2019-06-19 DIAGNOSIS — M25612 Stiffness of left shoulder, not elsewhere classified: Secondary | ICD-10-CM | POA: Diagnosis not present

## 2019-06-19 DIAGNOSIS — M25512 Pain in left shoulder: Secondary | ICD-10-CM

## 2019-06-19 NOTE — Therapy (Signed)
[facility] [address]  [place], [place], [address] Phone: [phone]   Fax:  [phone]  Physical Therapy Treatment  Patient Details  Name: Shirley [name] MRN: [id number] Date of Birth: 19-Feb-1976 Referring Provider (PT): Karlton Lemon, MD   Encounter Date: 06/19/2019  PT End of Session - 06/19/19 1742    Visit Number  6    Number of Visits  10    Date for PT Re-Evaluation  07/04/19    Authorization Type  Medicaid    Authorization Time Period  6 visits 08/07-09/17    Authorization - Visit Number  2    Authorization - Number of Visits  6    PT Start Time  1700    PT Stop Time  1700    PT Time Calculation (min)  0 min    Activity Tolerance  Patient tolerated treatment well;Patient limited by pain    Behavior During Therapy  [facility] for tasks assessed/performed       Past Medical History:  Diagnosis Date  . ADHD (attention deficit hyperactivity disorder)   . ALLERGIC RHINITIS 06/26/2008  . ANEMIA-IRON DEFICIENCY 06/26/2008  . 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  . ANKLE PAIN, LEFT 06/26/2008  . BACK PAIN, LUMBAR 10/15/2007  . BIPOLAR DISORDER UNSPECIFIED 09/13/2007  . CERVICAL RADICULOPATHY, LEFT 06/26/2008  . Cervicalgia 10/15/2007  . Chronic pain syndrome 03/21/2011  . COMMON MIGRAINE 06/26/2008  . DIABETES MELLITUS, TYPE II 06/05/2007  . DYSPNEA 12/05/2007  . EAR PAIN, RIGHT 12/05/2007  . ELEVATED BP READING WITHOUT DX HYPERTENSION 07/29/2009  . FATIGUE 12/05/2007  . FIBROMYALGIA 06/26/2008  . GASTROENTERITIS, ACUTE 12/15/2008  . GERD 06/26/2008  . Headache(784.0) 09/13/2007  . HYPERLIPIDEMIA 09/13/2007  . JOINT EFFUSION, RIGHT KNEE 07/29/2009  . KNEE PAIN, LEFT 11/21/2010  . LUMBAR RADICULOPATHY, LEFT 06/26/2008  . OTHER DISEASE OF PHARYNX OR NASOPHARYNX 07/03/2008  . PERIPHERAL EDEMA 01/06/2009  . POLYARTHRITIS 11/21/2010  . SHOULDER PAIN, BILATERAL 07/03/2008  . SINUSITIS- ACUTE-NOS 07/29/2009  . TACHYCARDIA 12/05/2007  . THYROID  NODULE, RIGHT 11/20/2008  . TREMOR 01/02/2008  . Vertigo     Past Surgical History:  Procedure Laterality Date  . back surgury     lumbar 2001  . CESAREAN SECTION     x 3  . thyroid fine needle aspiration  May 2008   showed non neoplastic goiter  . VAGINAL HYSTERECTOMY     fibroids    There were no vitals filed for this visit.  Subjective Assessment - 06/19/19 1701    Subjective  Has been suffering from back pain since over the weekend without known cause. Has been given injection and Prednisone. Having pain sitting. L shoulder is feeling good, but having pain between her shoulder blades as well as in the LB, radiating down the R LE.    Pertinent History  vertigo, tremor, tachycardia, B shoulder pain, polyarthritis, peripheral edema, L lumbar radiculopathy, L knee pain, HLD, HA/migraine, GERD, fibromyalgia, DMII, chronic pain syndrome, L cervical radiculopathy, bipolar disorder, L ankle pain, anemia, ADHD, back surgery 2001    Patient Stated Goals  build strength and not have to have surgery    Currently in Pain?  Yes    Pain Score  7     Pain Location  Back    Pain Orientation  Right    Pain Descriptors / Indicators  Aching    Pain Type  Acute pain    Pain Radiating Towards  down to R LE  [facility] Adult PT Treatment/Exercise - 06/19/19 0001      Self-Care   Self-Care  Other Self-Care Comments    Other Self-Care Comments   edu and practice on self-STM to B thoracolumbar paraspinals and R buttock for pain relief      Shoulder Exercises: Prone   Other Prone Exercises  limited ROM prone on elbows 10x3" to tolerance      Shoulder Exercises: ROM/Strengthening   UBE (Upper Arm Bike)  L1 x 62mn forward/3 min back   unable to tolerate- discontinued     Shoulder Exercises: Stretch   Other Shoulder Stretches  sitting lumbar flexion rollout with green pball 10x5"      Modalities   Modalities  Electrical Stimulation;Moist Heat      Moist Heat  Therapy   Number Minutes Moist Heat  10 Minutes    Moist Heat Location  Lumbar Spine   mid back and low back     Electrical Stimulation   Electrical Stimulation Location  B thoracic and lumbar parapsinals    Electrical Stimulation Action  IFC    Electrical Stimulation Parameters  output: 18 to tolerance; 10 min    Electrical Stimulation Goals  Tone;Pain      Manual Therapy   Manual therapy comments  prone    Soft tissue mobilization  STM over B rhomboids, LS, UT, thoracic and lumbar paraspinals, R piriformis and proximal- severe tightness and tenderness throughout    Myofascial Release  manual TPR to B UT, LS, R piriformis and proximal glute- severely tender               PT Short Term Goals - 05/23/19 0854      PT SHORT TERM GOAL #1   Title  Patient to be independent with initial HEP.    Time  1    Period  Weeks    Status  Achieved    Target Date  05/05/19        PT Long Term Goals - 05/23/19 0854      PT LONG TERM GOAL #1   Title  Patient to be independent with advanced HEP.    Time  6    Period  Weeks    Status  Partially Met   met for current   Target Date  07/04/19      PT LONG TERM GOAL #2   Title  Patient to demonstrate L shoulder AROM WFL and without pain limiting.    Time  6    Period  Weeks    Status  Partially Met   has shown improvements in all planes of L shoulder movement; abduction only remaining limitation   Target Date  07/04/19      PT LONG TERM GOAL #3   Title  Patient to demonstrate L shoulder strength >=4+/5.    Time  6    Period  Weeks    Status  Partially Met   has shown improvements in L shoulder flexion, abduction, ER; most limited in IR   Target Date  07/04/19      PT LONG TERM GOAL #4   Title  Patient to report tolerance of sleeping in L sidelying for 4 hours without increase in pain.    Time  6    Period  Weeks    Status  On-going   tolerating 5 min before onset of pain   Target Date  07/04/19            Plan -  06/19/19 1743    Clinical Impression Statement  Patient arrived to session with report of severe acute exacerbation of mid and low back pain since the weekend. Was given injection to LB and prescribed Prednisone. Patient unable to tolerate UBE warm up d/t back pain. Thus, had patient perform gentle thoracolumbar stretching within her tolerance. Patient tolerated STM and manual TPR to B sides of posterior chain, with considerable tenderness and trigger points palpable throughout B LS, UT, R piriformis and proximal glute. Patient reported good relief after manual therapy. Educated patient on use of tennis ball for self- STM for continue benefit at home. Ended session with e-stim and moist heat to mid and low back for pain relief. Patient reported good relief at end of session. Plan to continue addressing L shoulder in future sessions after hopeful resolution of back pain.    Comorbidities  vertigo, tremor, tachycardia, B shoulder pain, polyarthritis, peripheral edema, L lumbar radiculopathy, L knee pain, HLD, HA/migraine, GERD, fibromyalgia, DMII, chronic pain syndrome, L cervical radiculopathy, bipolar disorder, L ankle pain, anemia, ADHD, back surgery 2001    PT Treatment/Interventions  ADLs/Self Care Home Management;Cryotherapy;Electrical Stimulation;Iontophoresis 73m/ml Dexamethasone;Moist Heat;Therapeutic exercise;Therapeutic activities;Functional mobility training;Ultrasound;Neuromuscular re-education;Patient/family education;Manual techniques;Vasopneumatic Device;Taping;Splinting;Energy conservation;Dry needling;Passive range of motion    PT Next Visit Plan  STM and strengthening    Consulted and Agree with Plan of Care  Patient       Patient will benefit from skilled therapeutic intervention in order to improve the following deficits and impairments:  Hypomobility, Increased edema, Decreased activity tolerance, Decreased strength, Increased fascial restricitons, Impaired UE functional use, Pain,  Increased muscle spasms, Improper body mechanics, Decreased range of motion, Impaired flexibility, Postural dysfunction  Visit Diagnosis: Acute pain of left shoulder  Stiffness of left shoulder, not elsewhere classified  Muscle weakness (generalized)     Problem List Patient Active Problem List   Diagnosis Date Noted  . Toe injury, left, initial encounter 05/22/2019  . Right foot injury, initial encounter 02/08/2018  . Bilateral knee pain 01/14/2018  . MRSA (methicillin resistant staph aureus) urine culture positive on 07/06/16 07/06/2016  . Foot injury 12/20/2015  . Thoracic back pain 12/20/2015  . Low back pain radiating to right leg 12/01/2015  . Shoulder impingement syndrome, left 11/07/2013  . Left flank pain 11/07/2013  . Encounter for long-term (current) use of high-risk medication 05/29/2011  . Preventative health care 05/28/2011  . Chronic pain syndrome 03/21/2011  . KNEE PAIN, LEFT 11/21/2010  . POLYARTHRITIS 11/21/2010  . BACK PAIN 11/22/2009  . JOINT EFFUSION, RIGHT KNEE 07/29/2009  . ELEVATED BP READING WITHOUT DX HYPERTENSION 07/29/2009  . 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  . PERIPHERAL EDEMA 01/06/2009  . GASTROENTERITIS, ACUTE 12/15/2008  . DYSPHAGIA UNSPECIFIED 11/23/2008  . THYROID NODULE, RIGHT 11/20/2008  . SHOULDER PAIN, BILATERAL 07/03/2008  . ANEMIA-IRON DEFICIENCY 06/26/2008  . COMMON MIGRAINE 06/26/2008  . ALLERGIC RHINITIS 06/26/2008  . GERD 06/26/2008  . ANKLE PAIN, LEFT 06/26/2008  . CERVICAL RADICULOPATHY, LEFT 06/26/2008  . LUMBAR RADICULOPATHY, LEFT 06/26/2008  . FIBROMYALGIA 06/26/2008  . TREMOR 01/02/2008  . EAR PAIN, RIGHT 12/05/2007  . FATIGUE 12/05/2007  . TACHYCARDIA 12/05/2007  . DYSPNEA 12/05/2007  . Neck pain 10/15/2007  . BACK PAIN, LUMBAR 10/15/2007  . Pain in Soft Tissues of Limb 10/15/2007  . Hyperlipidemia 09/13/2007  . BIPOLAR DISORDER UNSPECIFIED 09/13/2007  . Headache(784.0) 09/13/2007  . Diabetes ([place] 06/05/2007     YJanene Harvey PT, DPT 06/19/19 6:00 PM   [facility]  [address]  [place], [place], [address] Phone: [phone]   Fax:  [phone]  Name: Shirley [name] MRN: [id number] Date of Birth: 03-25-1976

## 2019-06-20 ENCOUNTER — Encounter: Payer: BC Managed Care – PPO | Admitting: Physical Therapy

## 2019-06-25 ENCOUNTER — Encounter: Payer: Self-pay | Admitting: Physical Therapy

## 2019-06-25 ENCOUNTER — Ambulatory Visit: Payer: BC Managed Care – PPO | Attending: Family Medicine | Admitting: Physical Therapy

## 2019-06-25 ENCOUNTER — Other Ambulatory Visit: Payer: Self-pay

## 2019-06-25 DIAGNOSIS — M6281 Muscle weakness (generalized): Secondary | ICD-10-CM

## 2019-06-25 DIAGNOSIS — M25512 Pain in left shoulder: Secondary | ICD-10-CM | POA: Diagnosis not present

## 2019-06-25 DIAGNOSIS — M25612 Stiffness of left shoulder, not elsewhere classified: Secondary | ICD-10-CM | POA: Diagnosis not present

## 2019-06-25 NOTE — Therapy (Signed)
[facility] [address]  [place], [place], [address] Phone: [phone]   Fax:  [phone]  Physical Therapy Treatment  Patient Details  Name: Shirley [name] MRN: [id number] Date of Birth: 09/03/1976 Referring Provider (PT): Karlton Lemon, MD   Encounter Date: 06/25/2019  PT End of Session - 06/25/19 1613    Visit Number  7    Number of Visits  10    Date for PT Re-Evaluation  07/04/19    Authorization Type  Medicaid    Authorization Time Period  6 visits 08/07-09/17    Authorization - Visit Number  3    Authorization - Number of Visits  6    PT Start Time  9179    PT Stop Time  1505    PT Time Calculation (min)  43 min    Activity Tolerance  Patient tolerated treatment well    Behavior During Therapy  [facility] for tasks assessed/performed       Past Medical History:  Diagnosis Date  . ADHD (attention deficit hyperactivity disorder)   . ALLERGIC RHINITIS 06/26/2008  . ANEMIA-IRON DEFICIENCY 06/26/2008  . 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  . ANKLE PAIN, LEFT 06/26/2008  . BACK PAIN, LUMBAR 10/15/2007  . BIPOLAR DISORDER UNSPECIFIED 09/13/2007  . CERVICAL RADICULOPATHY, LEFT 06/26/2008  . Cervicalgia 10/15/2007  . Chronic pain syndrome 03/21/2011  . COMMON MIGRAINE 06/26/2008  . DIABETES MELLITUS, TYPE II 06/05/2007  . DYSPNEA 12/05/2007  . EAR PAIN, RIGHT 12/05/2007  . ELEVATED BP READING WITHOUT DX HYPERTENSION 07/29/2009  . FATIGUE 12/05/2007  . FIBROMYALGIA 06/26/2008  . GASTROENTERITIS, ACUTE 12/15/2008  . GERD 06/26/2008  . Headache(784.0) 09/13/2007  . HYPERLIPIDEMIA 09/13/2007  . JOINT EFFUSION, RIGHT KNEE 07/29/2009  . KNEE PAIN, LEFT 11/21/2010  . LUMBAR RADICULOPATHY, LEFT 06/26/2008  . OTHER DISEASE OF PHARYNX OR NASOPHARYNX 07/03/2008  . PERIPHERAL EDEMA 01/06/2009  . POLYARTHRITIS 11/21/2010  . SHOULDER PAIN, BILATERAL 07/03/2008  . SINUSITIS- ACUTE-NOS 07/29/2009  . TACHYCARDIA 12/05/2007  . THYROID NODULE, RIGHT 11/20/2008   . TREMOR 01/02/2008  . Vertigo     Past Surgical History:  Procedure Laterality Date  . back surgury     lumbar 2001  . CESAREAN SECTION     x 3  . thyroid fine needle aspiration  May 2008   showed non neoplastic goiter  . VAGINAL HYSTERECTOMY     fibroids    There were no vitals filed for this visit.  Subjective Assessment - 06/25/19 1532    Subjective  Feeling better after last session and doing stretches with her personal trainer.    Pertinent History  vertigo, tremor, tachycardia, B shoulder pain, polyarthritis, peripheral edema, L lumbar radiculopathy, L knee pain, HLD, HA/migraine, GERD, fibromyalgia, DMII, chronic pain syndrome, L cervical radiculopathy, bipolar disorder, L ankle pain, anemia, ADHD, back surgery 2001    Patient Stated Goals  build strength and not have to have surgery    Currently in Pain?  No/denies                       [facility] Adult PT Treatment/Exercise - 06/25/19 0001      Shoulder Exercises: Supine   Protraction  Strengthening;Both;15 reps;Weights    Protraction Weight (lbs)  5    Protraction Limitations  2x15; good form    Other Supine Exercises  L shoulder D2 flexion 2x10 with yellow TB    manual cues for proper plane of motion  Shoulder Exercises: Seated   Abduction  Strengthening;Left;10 reps;Weights    ABduction Weight (lbs)  1    ABduction Limitations  good form      Shoulder Exercises: Prone   Extension  Strengthening;Left;10 reps    Extension Weight (lbs)  1    Extension Limitations  prone I over green pball     Horizontal ABduction 1  Strengthening;Left;10 reps    Horizontal ABduction 1 Weight (lbs)  1    Horizontal ABduction 1 Limitations  prone T over green pball    Other Prone Exercises  L shoulder prone Y over green pball with 1# x10      Shoulder Exercises: Sidelying   ABduction  Strengthening;Left;10 reps;Weights    ABduction Weight (lbs)  2    ABduction Limitations  thumb up; good form      Shoulder  Exercises: ROM/Strengthening   UBE (Upper Arm Bike)  L1.5 3mn forward/3 min back    Lat Pull Limitations  2x10 15#    Cybex Row Limitations  row with narrow grip 2x10 20#             PT Education - 06/25/19 1608    Education Details  discussion on progress with PT with agreement to D/C next session d/t satisfaction with CLOF    Person(s) Educated  Patient    Methods  Explanation;Demonstration;Tactile cues;Verbal cues;Handout    Comprehension  Verbalized understanding;Returned demonstration       PT Short Term Goals - 05/23/19 0854      PT SHORT TERM GOAL #1   Title  Patient to be independent with initial HEP.    Time  1    Period  Weeks    Status  Achieved    Target Date  05/05/19        PT Long Term Goals - 05/23/19 0854      PT LONG TERM GOAL #1   Title  Patient to be independent with advanced HEP.    Time  6    Period  Weeks    Status  Partially Met   met for current   Target Date  07/04/19      PT LONG TERM GOAL #2   Title  Patient to demonstrate L shoulder AROM WFL and without pain limiting.    Time  6    Period  Weeks    Status  Partially Met   has shown improvements in all planes of L shoulder movement; abduction only remaining limitation   Target Date  07/04/19      PT LONG TERM GOAL #3   Title  Patient to demonstrate L shoulder strength >=4+/5.    Time  6    Period  Weeks    Status  Partially Met   has shown improvements in L shoulder flexion, abduction, ER; most limited in IR   Target Date  07/04/19      PT LONG TERM GOAL #4   Title  Patient to report tolerance of sleeping in L sidelying for 4 hours without increase in pain.    Time  6    Period  Weeks    Status  On-going   tolerating 5 min before onset of pain   Target Date  07/04/19            Plan - 06/25/19 1614    Clinical Impression Statement  Patient reporting improvement in back pain after last session as well as from stretching with her personal trainer yesterday.  Introduced supine resisted diagonals with patient demonstrating good form and tolerance. Able to increase weighted resistance with sidelying and sitting abduction with good tolerance and scapulohumeral rhythm- previously this was a very painful activity for the patient. Patient overall with excellent form and tolerance during session today. Spoke with patient about her progress with PT thus far as patient has come a long way. Patient agreeable to D/C next session.    Comorbidities  vertigo, tremor, tachycardia, B shoulder pain, polyarthritis, peripheral edema, L lumbar radiculopathy, L knee pain, HLD, HA/migraine, GERD, fibromyalgia, DMII, chronic pain syndrome, L cervical radiculopathy, bipolar disorder, L ankle pain, anemia, ADHD, back surgery 2001    PT Treatment/Interventions  ADLs/Self Care Home Management;Cryotherapy;Electrical Stimulation;Iontophoresis 67m/ml Dexamethasone;Moist Heat;Therapeutic exercise;Therapeutic activities;Functional mobility training;Ultrasound;Neuromuscular re-education;Patient/family education;Manual techniques;Vasopneumatic Device;Taping;Splinting;Energy conservation;Dry needling;Passive range of motion    PT Next Visit Plan  STM and strengthening    Consulted and Agree with Plan of Care  Patient       Patient will benefit from skilled therapeutic intervention in order to improve the following deficits and impairments:  Hypomobility, Increased edema, Decreased activity tolerance, Decreased strength, Increased fascial restricitons, Impaired UE functional use, Pain, Increased muscle spasms, Improper body mechanics, Decreased range of motion, Impaired flexibility, Postural dysfunction  Visit Diagnosis: Acute pain of left shoulder  Stiffness of left shoulder, not elsewhere classified  Muscle weakness (generalized)     Problem List Patient Active Problem List   Diagnosis Date Noted  . Toe injury, left, initial encounter 05/22/2019  . Right foot injury, initial  encounter 02/08/2018  . Bilateral knee pain 01/14/2018  . MRSA (methicillin resistant staph aureus) urine culture positive on 07/06/16 07/06/2016  . Foot injury 12/20/2015  . Thoracic back pain 12/20/2015  . Low back pain radiating to right leg 12/01/2015  . Shoulder impingement syndrome, left 11/07/2013  . Left flank pain 11/07/2013  . Encounter for long-term (current) use of high-risk medication 05/29/2011  . Preventative health care 05/28/2011  . Chronic pain syndrome 03/21/2011  . KNEE PAIN, LEFT 11/21/2010  . POLYARTHRITIS 11/21/2010  . BACK PAIN 11/22/2009  . JOINT EFFUSION, RIGHT KNEE 07/29/2009  . ELEVATED BP READING WITHOUT DX HYPERTENSION 07/29/2009  . 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  . PERIPHERAL EDEMA 01/06/2009  . GASTROENTERITIS, ACUTE 12/15/2008  . DYSPHAGIA UNSPECIFIED 11/23/2008  . THYROID NODULE, RIGHT 11/20/2008  . SHOULDER PAIN, BILATERAL 07/03/2008  . ANEMIA-IRON DEFICIENCY 06/26/2008  . COMMON MIGRAINE 06/26/2008  . ALLERGIC RHINITIS 06/26/2008  . GERD 06/26/2008  . ANKLE PAIN, LEFT 06/26/2008  . CERVICAL RADICULOPATHY, LEFT 06/26/2008  . LUMBAR RADICULOPATHY, LEFT 06/26/2008  . FIBROMYALGIA 06/26/2008  . TREMOR 01/02/2008  . EAR PAIN, RIGHT 12/05/2007  . FATIGUE 12/05/2007  . TACHYCARDIA 12/05/2007  . DYSPNEA 12/05/2007  . Neck pain 10/15/2007  . BACK PAIN, LUMBAR 10/15/2007  . Pain in Soft Tissues of Limb 10/15/2007  . Hyperlipidemia 09/13/2007  . BIPOLAR DISORDER UNSPECIFIED 09/13/2007  . Headache(784.0) 09/13/2007  . Diabetes ([place] 06/05/2007    YJanene Harvey PT, DPT 06/25/19 4:18 PM   [facility] [address] [place] [address]Phone: [phone]  Fax:  [phone] Name: Shirley CUMBERMRN: [id number] of Birth: [birth year]

## 2019-06-27 ENCOUNTER — Encounter: Payer: BC Managed Care – PPO | Admitting: Physical Therapy

## 2019-07-02 ENCOUNTER — Other Ambulatory Visit: Payer: Self-pay

## 2019-07-02 ENCOUNTER — Ambulatory Visit: Payer: BC Managed Care – PPO | Admitting: Physical Therapy

## 2019-07-02 ENCOUNTER — Encounter: Payer: Self-pay | Admitting: Physical Therapy

## 2019-07-02 DIAGNOSIS — M25512 Pain in left shoulder: Secondary | ICD-10-CM | POA: Diagnosis not present

## 2019-07-02 DIAGNOSIS — M25612 Stiffness of left shoulder, not elsewhere classified: Secondary | ICD-10-CM | POA: Diagnosis not present

## 2019-07-02 DIAGNOSIS — M6281 Muscle weakness (generalized): Secondary | ICD-10-CM

## 2019-07-02 NOTE — Therapy (Signed)
[facility] [address]  [place], [place], [address] Phone: [phone]   Fax:  [phone]  Physical Therapy Discharge Summary  Patient Details  Name: Shirley [name] MRN: [id number] Date of Birth: [birth year] Referring Provider (PT): Karlton Lemon, MD   Encounter Date: 07/02/2019  PT End of Session - 07/02/19 1654    Visit Number  8    Number of Visits  10    Date for PT Re-Evaluation  07/04/19    Authorization Type  Medicaid    Authorization Time Period  6 visits 08/07-09/17    Authorization - Visit Number  4    Authorization - Number of Visits  6    PT Start Time  7371    PT Stop Time  1521    PT Time Calculation (min)  35 min    Activity Tolerance  Patient tolerated treatment well    Behavior During Therapy  [facility] for tasks assessed/performed       Past Medical History:  Diagnosis Date  . ADHD (attention deficit hyperactivity disorder)   . ALLERGIC RHINITIS 06/26/2008  . ANEMIA-IRON DEFICIENCY 06/26/2008  . 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  . ANKLE PAIN, LEFT 06/26/2008  . BACK PAIN, LUMBAR 10/15/2007  . BIPOLAR DISORDER UNSPECIFIED 09/13/2007  . CERVICAL RADICULOPATHY, LEFT 06/26/2008  . Cervicalgia 10/15/2007  . Chronic pain syndrome 03/21/2011  . COMMON MIGRAINE 06/26/2008  . DIABETES MELLITUS, TYPE II 06/05/2007  . DYSPNEA 12/05/2007  . EAR PAIN, RIGHT 12/05/2007  . ELEVATED BP READING WITHOUT DX HYPERTENSION 07/29/2009  . FATIGUE 12/05/2007  . FIBROMYALGIA 06/26/2008  . GASTROENTERITIS, ACUTE 12/15/2008  . GERD 06/26/2008  . Headache(784.0) 09/13/2007  . HYPERLIPIDEMIA 09/13/2007  . JOINT EFFUSION, RIGHT KNEE 07/29/2009  . KNEE PAIN, LEFT 11/21/2010  . LUMBAR RADICULOPATHY, LEFT 06/26/2008  . OTHER DISEASE OF PHARYNX OR NASOPHARYNX 07/03/2008  . PERIPHERAL EDEMA 01/06/2009  . POLYARTHRITIS 11/21/2010  . SHOULDER PAIN, BILATERAL 07/03/2008  . SINUSITIS- ACUTE-NOS 07/29/2009  . TACHYCARDIA 12/05/2007  . THYROID NODULE, RIGHT  11/20/2008  . TREMOR 01/02/2008  . Vertigo     Past Surgical History:  Procedure Laterality Date  . back surgury     lumbar 2001  . CESAREAN SECTION     x 3  . thyroid fine needle aspiration  May 2008   showed non neoplastic goiter  . VAGINAL HYSTERECTOMY     fibroids    There were no vitals filed for this visit.  Subjective Assessment - 07/02/19 1448    Subjective  Has been doing well. L shoulder is feeling good- no problems whatsoever. Reports 100% improvement in L shoudlder since starting PT. Has improved in ROM and is now able to scratch her back and stretch out the shoulder without pain.    Pertinent History  vertigo, tremor, tachycardia, B shoulder pain, polyarthritis, peripheral edema, L lumbar radiculopathy, L knee pain, HLD, HA/migraine, GERD, fibromyalgia, DMII, chronic pain syndrome, L cervical radiculopathy, bipolar disorder, L ankle pain, anemia, ADHD, back surgery 2001    Patient Stated Goals  build strength and not have to have surgery    Currently in Pain?  Yes    Pain Score  1     Pain Location  Back    Pain Orientation  Mid    Pain Descriptors / Indicators  Burning    Pain Type  Acute pain         [facility] PT Assessment - 07/02/19 0001  AROM   Left Shoulder Flexion  161 Degrees    Left Shoulder ABduction  163 Degrees    Left Shoulder Internal Rotation  --   FIR T7   Left Shoulder External Rotation  --   FER T4     Strength   Left Shoulder Flexion  4+/5    Left Shoulder ABduction  4/5    Left Shoulder Internal Rotation  4+/5    Left Shoulder External Rotation  4/5                   [facility] Adult PT Treatment/Exercise - 07/02/19 0001      Shoulder Exercises: Seated   Horizontal ABduction  Strengthening;Both;10 reps;Theraband    Theraband Level (Shoulder Horizontal ABduction)  Level 1 (Yellow)    Horizontal ABduction Limitations  cues to maintain shoulders at 90 deg    External Rotation  Strengthening;Both;10 reps;Theraband    Theraband  Level (Shoulder External Rotation)  Level 1 (Yellow)    External Rotation Limitations  good effort for scap retraction and depression    Abduction  Strengthening;Left;10 reps;Weights    ABduction Weight (lbs)  1    ABduction Limitations  good control      Shoulder Exercises: Standing   Row  Strengthening;Both;Theraband;15 reps    Theraband Level (Shoulder Row)  Level 3 (Green)    Row Limitations  good form      Shoulder Exercises: ROM/Strengthening   UBE (Upper Arm Bike)  L1.5 43mn forward/3 min back      Shoulder Exercises: Stretch   Corner Stretch  2 reps;30 seconds    Corner Stretch Limitations  good form             PT Education - 07/02/19 1654    Education Details  update/consolidation to HEP; edu on postural awareness at work    [employer] Educated  Patient    Methods  Explanation;Demonstration;Tactile cues;Verbal cues;Handout    Comprehension  Verbalized understanding;Returned demonstration       PT Short Term Goals - 07/02/19 1452      PT SHORT TERM GOAL #1   Title  Patient to be independent with initial HEP.    Time  1    Period  Weeks    Status  Achieved    Target Date  05/05/19        PT Long Term Goals - 07/02/19 1452      PT LONG TERM GOAL #1   Title  Patient to be independent with advanced HEP.    Time  6    Period  Weeks    Status  Achieved      PT LONG TERM GOAL #2   Title  Patient to demonstrate L shoulder AROM WFL and without pain limiting.    Time  6    Period  Weeks    Status  Achieved   AROM improved in flexion and abduction     PT LONG TERM GOAL #3   Title  Patient to demonstrate L shoulder strength >=4+/5.    Time  6    Period  Weeks    Status  Partially Met   still limited in abduction and ER     PT LONG TERM GOAL #4   Title  Patient to report tolerance of sleeping in L sidelying for 4 hours without increase in pain.    Time  6    Period  Weeks    Status  Achieved  Plan - 07/02/19 1659    Clinical  Impression Statement  Patient reported 100% improvement in L shoulder at beginning of session. Notes that she has noticed improvement in ROM, ability to scratch her back, stretch her arm, and sleep on L side without pain. Patient has been compliant with HEP as well as working out with her Physiological scientist. Patient has now met her sleeping tolerance goal. AROM has improved in L shoulder flexion and abduction, and patient now with quite symmetrical motion compared to opposite side. Strength has improved, however still limited in abduction and ER strength. Updated HEP with strengthening exercises to continue progressing shoulder strength- patient reported understanding. Patient has met ort partially met all goals at this time and is pleased with her progress. Patient is discharged at this time.    Comorbidities  vertigo, tremor, tachycardia, B shoulder pain, polyarthritis, peripheral edema, L lumbar radiculopathy, L knee pain, HLD, HA/migraine, GERD, fibromyalgia, DMII, chronic pain syndrome, L cervical radiculopathy, bipolar disorder, L ankle pain, anemia, ADHD, back surgery 2001    PT Treatment/Interventions  ADLs/Self Care Home Management;Cryotherapy;Electrical Stimulation;Iontophoresis 29m/ml Dexamethasone;Moist Heat;Therapeutic exercise;Therapeutic activities;Functional mobility training;Ultrasound;Neuromuscular re-education;Patient/family education;Manual techniques;Vasopneumatic Device;Taping;Splinting;Energy conservation;Dry needling;Passive range of motion    PT Next Visit Plan  DC at this time    Consulted and Agree with Plan of Care  Patient       Patient will benefit from skilled therapeutic intervention in order to improve the following deficits and impairments:  Hypomobility, Increased edema, Decreased activity tolerance, Decreased strength, Increased fascial restricitons, Impaired UE functional use, Pain, Increased muscle spasms, Improper body mechanics, Decreased range of motion, Impaired  flexibility, Postural dysfunction  Visit Diagnosis: Acute pain of left shoulder  Stiffness of left shoulder, not elsewhere classified  Muscle weakness (generalized)     Problem List Patient Active Problem List   Diagnosis Date Noted  . Toe injury, left, initial encounter 05/22/2019  . Right foot injury, initial encounter 02/08/2018  . Bilateral knee pain 01/14/2018  . MRSA (methicillin resistant staph aureus) urine culture positive on 07/06/16 07/06/2016  . Foot injury 12/20/2015  . Thoracic back pain 12/20/2015  . Low back pain radiating to right leg 12/01/2015  . Shoulder impingement syndrome, left 11/07/2013  . Left flank pain 11/07/2013  . Encounter for long-term (current) use of high-risk medication 05/29/2011  . Preventative health care 05/28/2011  . Chronic pain syndrome 03/21/2011  . KNEE PAIN, LEFT 11/21/2010  . POLYARTHRITIS 11/21/2010  . BACK PAIN 11/22/2009  . JOINT EFFUSION, RIGHT KNEE 07/29/2009  . ELEVATED BP READING WITHOUT DX HYPERTENSION 07/29/2009  . 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  . PERIPHERAL EDEMA 01/06/2009  . GASTROENTERITIS, ACUTE 12/15/2008  . DYSPHAGIA UNSPECIFIED 11/23/2008  . THYROID NODULE, RIGHT 11/20/2008  . SHOULDER PAIN, BILATERAL 07/03/2008  . ANEMIA-IRON DEFICIENCY 06/26/2008  . COMMON MIGRAINE 06/26/2008  . ALLERGIC RHINITIS 06/26/2008  . GERD 06/26/2008  . ANKLE PAIN, LEFT 06/26/2008  . CERVICAL RADICULOPATHY, LEFT 06/26/2008  . LUMBAR RADICULOPATHY, LEFT 06/26/2008  . FIBROMYALGIA 06/26/2008  . TREMOR 01/02/2008  . EAR PAIN, RIGHT 12/05/2007  . FATIGUE 12/05/2007  . TACHYCARDIA 12/05/2007  . DYSPNEA 12/05/2007  . Neck pain 10/15/2007  . BACK PAIN, LUMBAR 10/15/2007  . Pain in Soft Tissues of Limb 10/15/2007  . Hyperlipidemia 09/13/2007  . BIPOLAR DISORDER UNSPECIFIED 09/13/2007  . Headache(784.0) 09/13/2007  . Diabetes ([place] 06/05/2007     PHYSICAL THERAPY DISCHARGE SUMMARY  Visits from Start of Care: 8  Current functional  level related to goals / functional outcomes: See  above clinical impression   Remaining deficits: Decreased L shoulder strength   Education / Equipment: HEP  Plan: Patient agrees to discharge.  Patient goals were partially met. Patient is being discharged due to being pleased with the current functional level.  ?????     Janene Harvey, PT, DPT 07/02/19 5:01 PM   [facility] [address]  [place], [place], [address] Phone: [phone]   Fax:  [phone]  Name: Shirley [name] MRN: [id number] Date of Birth: 1976-06-17

## 2019-07-04 ENCOUNTER — Encounter: Payer: BC Managed Care – PPO | Admitting: Physical Therapy

## 2019-08-04 ENCOUNTER — Ambulatory Visit: Payer: BC Managed Care – PPO | Admitting: Family Medicine

## 2019-08-04 NOTE — Progress Notes (Deleted)
Shirley [name] - 43 y.o. female MRN [id number]  Date of birth: 03/10/1976  SUBJECTIVE:  Including CC & ROS.  No chief complaint on file.   Shirley [name] is a 43 y.o. female that is  ***.  ***   Review of Systems  HISTORY: Past Medical, Surgical, Social, and Family History Reviewed & Updated per EMR.   Pertinent Historical Findings include:  Past Medical History:  Diagnosis Date  . ADHD (attention deficit hyperactivity disorder)   . ALLERGIC RHINITIS 06/26/2008  . ANEMIA-IRON DEFICIENCY 06/26/2008  . 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  . ANKLE PAIN, LEFT 06/26/2008  . BACK PAIN, LUMBAR 10/15/2007  . BIPOLAR DISORDER UNSPECIFIED 09/13/2007  . CERVICAL RADICULOPATHY, LEFT 06/26/2008  . Cervicalgia 10/15/2007  . Chronic pain syndrome 03/21/2011  . COMMON MIGRAINE 06/26/2008  . DIABETES MELLITUS, TYPE II 06/05/2007  . DYSPNEA 12/05/2007  . EAR PAIN, RIGHT 12/05/2007  . ELEVATED BP READING WITHOUT DX HYPERTENSION 07/29/2009  . FATIGUE 12/05/2007  . FIBROMYALGIA 06/26/2008  . GASTROENTERITIS, ACUTE 12/15/2008  . GERD 06/26/2008  . Headache(784.0) 09/13/2007  . HYPERLIPIDEMIA 09/13/2007  . JOINT EFFUSION, RIGHT KNEE 07/29/2009  . KNEE PAIN, LEFT 11/21/2010  . LUMBAR RADICULOPATHY, LEFT 06/26/2008  . OTHER DISEASE OF PHARYNX OR NASOPHARYNX 07/03/2008  . PERIPHERAL EDEMA 01/06/2009  . POLYARTHRITIS 11/21/2010  . SHOULDER PAIN, BILATERAL 07/03/2008  . SINUSITIS- ACUTE-NOS 07/29/2009  . TACHYCARDIA 12/05/2007  . THYROID NODULE, RIGHT 11/20/2008  . TREMOR 01/02/2008  . Vertigo     Past Surgical History:  Procedure Laterality Date  . back surgury     lumbar 2001  . CESAREAN SECTION     x 3  . thyroid fine needle aspiration  May 2008   showed non neoplastic goiter  . VAGINAL HYSTERECTOMY     fibroids    Allergies  Allergen Reactions  . Sulfa Antibiotics Anaphylaxis  . Contrast Media [Iodinated Diagnostic Agents] Itching    Mri, severe heaving ~ can take benadryl without reaction  . Promethazine Hcl      Iv dose with benadryl causes severe aggrivation  . Reglan [Metoclopramide]     "It makes me crazy"    Family History  Problem Relation Age of Onset  . Thyroid disease Mother   . Diabetes Mother   . Asthma Mother   . Hypertension Father   . Hyperlipidemia Father   . Diabetes Father   . Emphysema Other   . Coronary artery disease Other   . Cancer Other        pancreatic and breast cancer  . Cancer Other        lung and prostate cancer  . Breast cancer Neg Hx      Social History   Socioeconomic History  . Marital status: Significant Other    Spouse name: Not on file  . Number of children: 3  . Years of education: Not on file  . Highest education level: Not on file  Occupational History    Employer: UNEMPLOYED  Social Needs  . Financial resource strain: Not on file  . Food insecurity    Worry: Not on file    Inability: Not on file  . Transportation needs    Medical: Not on file    Non-medical: Not on file  Tobacco Use  . Smoking status: Never Smoker  . Smokeless tobacco: Never Used  Substance and Sexual Activity  . Alcohol use: No  . Drug use: No  . Sexual activity: Not on file  Lifestyle  .   Physical activity    Days per week: Not on file    Minutes per session: Not on file  . Stress: Not on file  Relationships  . Social connections    Talks on phone: Not on file    Gets together: Not on file    Attends religious service: Not on file    Active member of club or organization: Not on file    Attends meetings of clubs or organizations: Not on file    Relationship status: Not on file  . Intimate partner violence    Fear of current or ex partner: Not on file    Emotionally abused: Not on file    Physically abused: Not on file    Forced sexual activity: Not on file  Other Topics Concern  . Not on file  Social History Narrative  . Not on file     PHYSICAL EXAM:  VS: There were no vitals taken for this visit. Physical Exam Gen: NAD, alert,  cooperative with exam, well-appearing ENT: normal lips, normal nasal mucosa,  Eye: normal EOM, normal conjunctiva and lids CV:  no edema, +2 pedal pulses   Resp: no accessory muscle use, non-labored,  GI: no masses or tenderness, no hernia  Skin: no rashes, no areas of induration  Neuro: normal tone, normal sensation to touch Psych:  normal insight, alert and oriented MSK:  ***      ASSESSMENT & PLAN:   No problem-specific Assessment & Plan notes found for this encounter.     

## 2019-08-25 ENCOUNTER — Other Ambulatory Visit: Payer: Self-pay

## 2019-08-25 ENCOUNTER — Ambulatory Visit (INDEPENDENT_AMBULATORY_CARE_PROVIDER_SITE_OTHER): Payer: BC Managed Care – PPO | Admitting: Family Medicine

## 2019-08-25 ENCOUNTER — Encounter: Payer: Self-pay | Admitting: Family Medicine

## 2019-08-25 VITALS — BP 132/86 | Ht 64.0 in | Wt 210.0 lb

## 2019-08-25 DIAGNOSIS — M542 Cervicalgia: Secondary | ICD-10-CM

## 2019-08-25 MED ORDER — METHYLPREDNISOLONE ACETATE 40 MG/ML IJ SUSP
40.0000 mg | Freq: Once | INTRAMUSCULAR | Status: AC
  Administered 2019-08-25: 40 mg via INTRAMUSCULAR

## 2019-08-25 MED ORDER — KETOROLAC TROMETHAMINE 30 MG/ML IJ SOLN
30.0000 mg | Freq: Once | INTRAMUSCULAR | Status: AC
  Administered 2019-08-25: 30 mg via INTRAMUSCULAR

## 2019-08-25 NOTE — Patient Instructions (Signed)
Good to see you Please try heat  Please try to take time for yourself.  Please try the exercises   Please send me a message in MyChart with any questions or updates.  Please see me back in 3-4 weeks.   --Dr. Raeford Razor

## 2019-08-25 NOTE — Progress Notes (Signed)
Shirley [name] - 43 y.o. female MRN [id number]  Date of birth: 1976/09/10  SUBJECTIVE:  Including CC & ROS.  Chief Complaint  Patient presents with  . Neck Pain    Shirley [name] is a 43 y.o. female that is  Presenting with neck pain and headache. Pain has been ongoing for about a week. The pain originates in her neck and trapezius and radiates proximally to the base of the skull.  She has some pain that radiates from the base to the front in a global distribution.  Denies any trauma or inciting event.  Feels worse intermittently through the course of the day.  No improvement with sleep.  Does have some photophobia and sensitivity at night.  Has a history of 1 prior concussion.  No double vision.  Has not had her eyes checked in about a year.  Independent review of the CT head and cervical spine from 2015 does not show any significant degenerative changes.   Review of Systems  Constitutional: Negative for fever.  HENT: Negative for congestion.   Respiratory: Negative for cough.   Cardiovascular: Negative for chest pain.  Gastrointestinal: Negative for abdominal pain.  Musculoskeletal: Positive for myalgias and neck pain.  Skin: Negative for color change.  Neurological: Negative for weakness.  Hematological: Negative for adenopathy.    HISTORY: Past Medical, Surgical, Social, and Family History Reviewed & Updated per EMR.   Pertinent Historical Findings include:  Past Medical History:  Diagnosis Date  . ADHD (attention deficit hyperactivity disorder)   . ALLERGIC RHINITIS 06/26/2008  . ANEMIA-IRON DEFICIENCY 06/26/2008  . 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  . ANKLE PAIN, LEFT 06/26/2008  . BACK PAIN, LUMBAR 10/15/2007  . BIPOLAR DISORDER UNSPECIFIED 09/13/2007  . CERVICAL RADICULOPATHY, LEFT 06/26/2008  . Cervicalgia 10/15/2007  . Chronic pain syndrome 03/21/2011  . COMMON MIGRAINE 06/26/2008  . DIABETES MELLITUS, TYPE II 06/05/2007  . DYSPNEA 12/05/2007  . EAR PAIN, RIGHT 12/05/2007  . ELEVATED  BP READING WITHOUT DX HYPERTENSION 07/29/2009  . FATIGUE 12/05/2007  . FIBROMYALGIA 06/26/2008  . GASTROENTERITIS, ACUTE 12/15/2008  . GERD 06/26/2008  . Headache(784.0) 09/13/2007  . HYPERLIPIDEMIA 09/13/2007  . JOINT EFFUSION, RIGHT KNEE 07/29/2009  . KNEE PAIN, LEFT 11/21/2010  . LUMBAR RADICULOPATHY, LEFT 06/26/2008  . OTHER DISEASE OF PHARYNX OR NASOPHARYNX 07/03/2008  . PERIPHERAL EDEMA 01/06/2009  . POLYARTHRITIS 11/21/2010  . SHOULDER PAIN, BILATERAL 07/03/2008  . SINUSITIS- ACUTE-NOS 07/29/2009  . TACHYCARDIA 12/05/2007  . THYROID NODULE, RIGHT 11/20/2008  . TREMOR 01/02/2008  . Vertigo     Past Surgical History:  Procedure Laterality Date  . back surgury     lumbar 2001  . CESAREAN SECTION     x 3  . thyroid fine needle aspiration  May 2008   showed non neoplastic goiter  . VAGINAL HYSTERECTOMY     fibroids    Allergies  Allergen Reactions  . Sulfa Antibiotics Anaphylaxis  . Contrast Media [Iodinated Diagnostic Agents] Itching    Mri, severe heaving ~ can take benadryl without reaction  . Promethazine Hcl     Iv dose with benadryl causes severe aggrivation  . Reglan [Metoclopramide]     "It makes me crazy"    Family History  Problem Relation Age of Onset  . Thyroid disease Mother   . Diabetes Mother   . Asthma Mother   . Hypertension Father   . Hyperlipidemia Father   . Diabetes Father   . Emphysema Other   . Coronary artery disease Other   .  Cancer Other        pancreatic and breast cancer  . Cancer Other        lung and prostate cancer  . Breast cancer Neg Hx      Social History   Socioeconomic History  . Marital status: Significant Other    Spouse name: Not on file  . Number of children: 3  . Years of education: Not on file  . Highest education level: Not on file  Occupational History    Employer: UNEMPLOYED  Social Needs  . Financial resource strain: Not on file  . Food insecurity    Worry: Not on file    Inability: Not on file  . Transportation  needs    Medical: Not on file    Non-medical: Not on file  Tobacco Use  . Smoking status: Never Smoker  . Smokeless tobacco: Never Used  Substance and Sexual Activity  . Alcohol use: No  . Drug use: No  . Sexual activity: Not on file  Lifestyle  . Physical activity    Days per week: Not on file    Minutes per session: Not on file  . Stress: Not on file  Relationships  . Social Herbalist on phone: Not on file    Gets together: Not on file    Attends religious service: Not on file    Active member of club or organization: Not on file    Attends meetings of clubs or organizations: Not on file    Relationship status: Not on file  . Intimate partner violence    Fear of current or ex partner: Not on file    Emotionally abused: Not on file    Physically abused: Not on file    Forced sexual activity: Not on file  Other Topics Concern  . Not on file  Social History Narrative  . Not on file     PHYSICAL EXAM:  VS: BP 132/86   Ht 5\' 4"  (1.626 m)   Wt 210 lb (95.3 kg)   BMI 36.05 kg/m  Physical Exam Gen: NAD, alert, cooperative with exam, well-appearing ENT: normal lips, normal nasal mucosa,  Eye: normal EOM, normal conjunctiva and lids CV:  no edema, +2 pedal pulses   Resp: no accessory muscle use, non-labored,  Skin: no rashes, no areas of induration  Neuro: normal tone, normal sensation to touch Psych:  normal insight, alert and oriented MSK:  Neck:  Tender to palpation over the right left paraspinal cervical muscles and trapezius muscles. Normal flexion extension. Normal lateral rotation. Normal sidebend. Normal shoulder range of motion. Normal grip strength. Neurovascularly intact     ASSESSMENT & PLAN:   Neck pain Pain seems to be muscular in nature.  Likely radiating and causing her headache which is likely a stress headache.  May have a component of migraine when it is periorbital. -IM Toradol and Depo. -Counseled on supportive care and home  exercise program. -If no improvement can consider imaging and trigger point injections.

## 2019-08-25 NOTE — Assessment & Plan Note (Signed)
Pain seems to be muscular in nature.  Likely radiating and causing her headache which is likely a stress headache.  May have a component of migraine when it is periorbital. -IM Toradol and Depo. -Counseled on supportive care and home exercise program. -If no improvement can consider imaging and trigger point injections.

## 2019-09-15 ENCOUNTER — Ambulatory Visit (INDEPENDENT_AMBULATORY_CARE_PROVIDER_SITE_OTHER): Payer: BC Managed Care – PPO | Admitting: Family Medicine

## 2019-09-15 ENCOUNTER — Encounter: Payer: Self-pay | Admitting: Family Medicine

## 2019-09-15 ENCOUNTER — Other Ambulatory Visit: Payer: Self-pay

## 2019-09-15 DIAGNOSIS — M542 Cervicalgia: Secondary | ICD-10-CM | POA: Diagnosis not present

## 2019-09-15 NOTE — Patient Instructions (Signed)
Good to see you Happy Thanksgiving  Please try the exercises. Try to do some stretching at while working  Please try heat   Please send me a message in MyChart with any questions or updates.  Please see [name] back as needed or in 2 months.   --Dr. Raeford Razor

## 2019-09-15 NOTE — Progress Notes (Signed)
Shirley [name] - 43 y.o. female MRN [id number]  Date of birth: 08-Jun-1976  SUBJECTIVE:  Including CC & ROS.  Chief Complaint  Patient presents with  . Follow-up    follow up for neck     Shirley [name] is a 43 y.o. female that is following up for her neck pain.  The pain is intermittent in nature.  She get mild improvement from the previous intramuscular injections.  The pain to be worse with stress.  She is to be localized to the neck.  Does get improvement with massage.  Still tries to stay active.    Review of Systems  Constitutional: Negative for fever.  HENT: Negative for congestion.   Respiratory: Negative for cough.   Cardiovascular: Negative for chest pain.  Gastrointestinal: Negative for abdominal pain.  Musculoskeletal: Positive for neck pain.  Skin: Negative for color change.  Neurological: Negative for weakness.  Hematological: Negative for adenopathy.    HISTORY: Past Medical, Surgical, Social, and Family History Reviewed & Updated per EMR.   Pertinent Historical Findings include:  Past Medical History:  Diagnosis Date  . ADHD (attention deficit hyperactivity disorder)   . ALLERGIC RHINITIS 06/26/2008  . ANEMIA-IRON DEFICIENCY 06/26/2008  . 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  . ANKLE PAIN, LEFT 06/26/2008  . BACK PAIN, LUMBAR 10/15/2007  . BIPOLAR DISORDER UNSPECIFIED 09/13/2007  . CERVICAL RADICULOPATHY, LEFT 06/26/2008  . Cervicalgia 10/15/2007  . Chronic pain syndrome 03/21/2011  . COMMON MIGRAINE 06/26/2008  . DIABETES MELLITUS, TYPE II 06/05/2007  . DYSPNEA 12/05/2007  . EAR PAIN, RIGHT 12/05/2007  . ELEVATED BP READING WITHOUT DX HYPERTENSION 07/29/2009  . FATIGUE 12/05/2007  . FIBROMYALGIA 06/26/2008  . GASTROENTERITIS, ACUTE 12/15/2008  . GERD 06/26/2008  . Headache(784.0) 09/13/2007  . HYPERLIPIDEMIA 09/13/2007  . JOINT EFFUSION, RIGHT KNEE 07/29/2009  . KNEE PAIN, LEFT 11/21/2010  . LUMBAR RADICULOPATHY, LEFT 06/26/2008  . OTHER DISEASE OF PHARYNX OR NASOPHARYNX 07/03/2008   . PERIPHERAL EDEMA 01/06/2009  . POLYARTHRITIS 11/21/2010  . SHOULDER PAIN, BILATERAL 07/03/2008  . SINUSITIS- ACUTE-NOS 07/29/2009  . TACHYCARDIA 12/05/2007  . THYROID NODULE, RIGHT 11/20/2008  . TREMOR 01/02/2008  . Vertigo     Past Surgical History:  Procedure Laterality Date  . back surgury     lumbar 2001  . CESAREAN SECTION     x 3  . thyroid fine needle aspiration  May 2008   showed non neoplastic goiter  . VAGINAL HYSTERECTOMY     fibroids    Allergies  Allergen Reactions  . Sulfa Antibiotics Anaphylaxis  . Contrast Media [Iodinated Diagnostic Agents] Itching    Mri, severe heaving ~ can take benadryl without reaction  . Promethazine Hcl     Iv dose with benadryl causes severe aggrivation  . Reglan [Metoclopramide]     "It makes me crazy"    Family History  Problem Relation Age of Onset  . Thyroid disease Mother   . Diabetes Mother   . Asthma Mother   . Hypertension Father   . Hyperlipidemia Father   . Diabetes Father   . Emphysema Other   . Coronary artery disease Other   . Cancer Other        pancreatic and breast cancer  . Cancer Other        lung and prostate cancer  . Breast cancer Neg Hx      Social History   Socioeconomic History  . Marital status: Significant Other    Spouse name: Not on file  . Number  of children: 3  . Years of education: Not on file  . Highest education level: Not on file  Occupational History    Employer: UNEMPLOYED  Social Needs  . Financial resource strain: Not on file  . Food insecurity    Worry: Not on file    Inability: Not on file  . Transportation needs    Medical: Not on file    Non-medical: Not on file  Tobacco Use  . Smoking status: Never Smoker  . Smokeless tobacco: Never Used  Substance and Sexual Activity  . Alcohol use: No  . Drug use: No  . Sexual activity: Not on file  Lifestyle  . Physical activity    Days per week: Not on file    Minutes per session: Not on file  . Stress: Not on file   Relationships  . Social Herbalist on phone: Not on file    Gets together: Not on file    Attends religious service: Not on file    Active member of club or organization: Not on file    Attends meetings of clubs or organizations: Not on file    Relationship status: Not on file  . Intimate partner violence    Fear of current or ex partner: Not on file    Emotionally abused: Not on file    Physically abused: Not on file    Forced sexual activity: Not on file  Other Topics Concern  . Not on file  Social History Narrative  . Not on file     PHYSICAL EXAM:  VS: BP 129/85   Ht 5\' 4"  (1.626 m)   Wt 200 lb (90.7 kg)   BMI 34.33 kg/m  Physical Exam Gen: NAD, alert, cooperative with exam, well-appearing ENT: normal lips, normal nasal mucosa,  Eye: normal EOM, normal conjunctiva and lids CV:  no edema, +2 pedal pulses   Resp: no accessory muscle use, non-labored,   Skin: no rashes, no areas of induration  Neuro: normal tone, normal sensation to touch Psych:  normal insight, alert and oriented MSK:  Neck: Tenderness palpation of the paraspinal muscles. Normal range of motion. Normal strength resistive shrug. Normal shoulder range of motion. Neurovascular intact     ASSESSMENT & PLAN:   Neck pain Pain has improved somewhat but not completely gone away.  Headaches are intermittent but she seems to be able to control them. -Counseled on exercise therapy and supportive care. -Could consider further imaging or physical therapy.

## 2019-09-15 NOTE — Assessment & Plan Note (Signed)
Pain has improved somewhat but not completely gone away.  Headaches are intermittent but she seems to be able to control them. -Counseled on exercise therapy and supportive care. -Could consider further imaging or physical therapy.

## 2019-11-19 ENCOUNTER — Encounter: Payer: Self-pay | Admitting: Family Medicine

## 2019-11-19 ENCOUNTER — Ambulatory Visit: Payer: BC Managed Care – PPO | Admitting: Family Medicine

## 2019-11-19 ENCOUNTER — Other Ambulatory Visit: Payer: Self-pay

## 2019-11-19 DIAGNOSIS — M7542 Impingement syndrome of left shoulder: Secondary | ICD-10-CM

## 2019-11-19 MED ORDER — PREDNISONE 5 MG PO TABS: ORAL_TABLET | ORAL | 0 refills | Status: DC

## 2019-11-19 NOTE — Patient Instructions (Signed)
Good to see you Please try ice  Please try the exericses   Please send me a message in MyChart with any questions or updates.  Please see me back in 4 weeks.   --Dr. Ireanna Finlayson  

## 2019-11-19 NOTE — Progress Notes (Signed)
Shirley [name] - 44 y.o. female MRN [id number]  Date of birth: 1976-05-06  SUBJECTIVE:  Including CC & ROS.  Chief Complaint  Patient presents with  . Shoulder Pain    left shoulder x 1 week    Shirley [name] is a 44 y.o. female that is presenting with acute left shoulder pain.  The pain is been ongoing for about a week.  It seems to be worse with certain movements.  She does endorse pain down to her hand at times.  Has tried ibuprofen with limited improvement.  Denies any numbness or tingling.  Denies any specific inciting event.   Review of Systems See HPI   HISTORY: Past Medical, Surgical, Social, and Family History Reviewed & Updated per EMR.   Pertinent Historical Findings include:  Past Medical History:  Diagnosis Date  . ADHD (attention deficit hyperactivity disorder)   . ALLERGIC RHINITIS 06/26/2008  . ANEMIA-IRON DEFICIENCY 06/26/2008  . 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  . ANKLE PAIN, LEFT 06/26/2008  . BACK PAIN, LUMBAR 10/15/2007  . BIPOLAR DISORDER UNSPECIFIED 09/13/2007  . CERVICAL RADICULOPATHY, LEFT 06/26/2008  . Cervicalgia 10/15/2007  . Chronic pain syndrome 03/21/2011  . COMMON MIGRAINE 06/26/2008  . DIABETES MELLITUS, TYPE II 06/05/2007  . DYSPNEA 12/05/2007  . EAR PAIN, RIGHT 12/05/2007  . ELEVATED BP READING WITHOUT DX HYPERTENSION 07/29/2009  . FATIGUE 12/05/2007  . FIBROMYALGIA 06/26/2008  . GASTROENTERITIS, ACUTE 12/15/2008  . GERD 06/26/2008  . Headache(784.0) 09/13/2007  . HYPERLIPIDEMIA 09/13/2007  . JOINT EFFUSION, RIGHT KNEE 07/29/2009  . KNEE PAIN, LEFT 11/21/2010  . LUMBAR RADICULOPATHY, LEFT 06/26/2008  . OTHER DISEASE OF PHARYNX OR NASOPHARYNX 07/03/2008  . PERIPHERAL EDEMA 01/06/2009  . POLYARTHRITIS 11/21/2010  . SHOULDER PAIN, BILATERAL 07/03/2008  . SINUSITIS- ACUTE-NOS 07/29/2009  . TACHYCARDIA 12/05/2007  . THYROID NODULE, RIGHT 11/20/2008  . TREMOR 01/02/2008  . Vertigo     Past Surgical History:  Procedure Laterality Date  . back surgury     lumbar 2001  .  CESAREAN SECTION     x 3  . thyroid fine needle aspiration  May 2008   showed non neoplastic goiter  . VAGINAL HYSTERECTOMY     fibroids    Allergies  Allergen Reactions  . Sulfa Antibiotics Anaphylaxis  . Contrast Media [Iodinated Diagnostic Agents] Itching    Mri, severe heaving ~ can take benadryl without reaction  . Promethazine Hcl     Iv dose with benadryl causes severe aggrivation  . Reglan [Metoclopramide]     "It makes me crazy"    Family History  Problem Relation Age of Onset  . Thyroid disease Mother   . Diabetes Mother   . Asthma Mother   . Hypertension Father   . Hyperlipidemia Father   . Diabetes Father   . Emphysema Other   . Coronary artery disease Other   . Cancer Other        pancreatic and breast cancer  . Cancer Other        lung and prostate cancer  . Breast cancer Neg Hx      Social History   Socioeconomic History  . Marital status: Significant Other    Spouse name: Not on file  . Number of children: 3  . Years of education: Not on file  . Highest education level: Not on file  Occupational History    Employer: UNEMPLOYED  Tobacco Use  . Smoking status: Never Smoker  . Smokeless tobacco: Never Used  Substance and Sexual Activity  .  Alcohol use: No  . Drug use: No  . Sexual activity: Not on file  Other Topics Concern  . Not on file  Social History Narrative  . Not on file   Social Determinants of Health   Financial Resource Strain:   . Difficulty of Paying Living Expenses: Not on file  Food Insecurity:   . Worried About Charity fundraiser in the Last Year: Not on file  . Ran Out of Food in the Last Year: Not on file  Transportation Needs:   . Lack of Transportation (Medical): Not on file  . Lack of Transportation (Non-Medical): Not on file  Physical Activity:   . Days of Exercise per Week: Not on file  . Minutes of Exercise per Session: Not on file  Stress:   . Feeling of Stress : Not on file  Social Connections:   .  Frequency of Communication with Friends and Family: Not on file  . Frequency of Social Gatherings with Friends and Family: Not on file  . Attends Religious Services: Not on file  . Active Member of Clubs or Organizations: Not on file  . Attends Archivist Meetings: Not on file  . Marital Status: Not on file  Intimate Partner Violence:   . Fear of Current or Ex-Partner: Not on file  . Emotionally Abused: Not on file  . Physically Abused: Not on file  . Sexually Abused: Not on file     PHYSICAL EXAM:  VS: BP 118/81   Ht 5\' 4"  (1.626 m)   Wt 200 lb (90.7 kg)   BMI 34.33 kg/m  Physical Exam Gen: NAD, alert, cooperative with exam, well-appearing ENT: normal lips, normal nasal mucosa,  Eye: normal EOM, normal conjunctiva and lids Skin: no rashes, no areas of induration  Neuro: normal tone, normal sensation to touch Psych:  normal insight, alert and oriented MSK:  Left shoulder: Normal range of motion. Normal strength resistance. Pain with empty can testing and Hawkins testing. Neurovascularly intact     ASSESSMENT & PLAN:   Shoulder impingement syndrome, left Acute in nature.  Seems a bit more localized to the shoulder.  May have a component of radiculopathy as well. -Prednisone. -Counseled on home exercise therapy and supportive care. -Could consider injection or physical therapy.

## 2019-11-20 NOTE — Assessment & Plan Note (Signed)
Acute in nature.  Seems a bit more localized to the shoulder.  May have a component of radiculopathy as well. -Prednisone. -Counseled on home exercise therapy and supportive care. -Could consider injection or physical therapy.

## 2019-12-16 ENCOUNTER — Other Ambulatory Visit (HOSPITAL_COMMUNITY): Payer: Self-pay | Admitting: Gastroenterology

## 2019-12-16 ENCOUNTER — Other Ambulatory Visit: Payer: Self-pay | Admitting: Gastroenterology

## 2019-12-16 DIAGNOSIS — K5904 Chronic idiopathic constipation: Secondary | ICD-10-CM | POA: Diagnosis not present

## 2019-12-16 DIAGNOSIS — K219 Gastro-esophageal reflux disease without esophagitis: Secondary | ICD-10-CM | POA: Diagnosis not present

## 2019-12-16 DIAGNOSIS — R1011 Right upper quadrant pain: Secondary | ICD-10-CM

## 2019-12-16 DIAGNOSIS — K600 Acute anal fissure: Secondary | ICD-10-CM | POA: Diagnosis not present

## 2019-12-17 ENCOUNTER — Ambulatory Visit: Payer: BC Managed Care – PPO | Admitting: Family Medicine

## 2019-12-17 NOTE — Progress Notes (Deleted)
Shirley [name] - 44 y.o. female MRN [id number]  Date of birth: 1975/11/22  SUBJECTIVE:  Including CC & ROS.  No chief complaint on file.   Shirley [name] is a 44 y.o. female that is  ***.  ***   Review of Systems See HPI   HISTORY: Past Medical, Surgical, Social, and Family History Reviewed & Updated per EMR.   Pertinent Historical Findings include:  Past Medical History:  Diagnosis Date  . ADHD (attention deficit hyperactivity disorder)   . ALLERGIC RHINITIS 06/26/2008  . ANEMIA-IRON DEFICIENCY 06/26/2008  . 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  . ANKLE PAIN, LEFT 06/26/2008  . BACK PAIN, LUMBAR 10/15/2007  . BIPOLAR DISORDER UNSPECIFIED 09/13/2007  . CERVICAL RADICULOPATHY, LEFT 06/26/2008  . Cervicalgia 10/15/2007  . Chronic pain syndrome 03/21/2011  . COMMON MIGRAINE 06/26/2008  . DIABETES MELLITUS, TYPE II 06/05/2007  . DYSPNEA 12/05/2007  . EAR PAIN, RIGHT 12/05/2007  . ELEVATED BP READING WITHOUT DX HYPERTENSION 07/29/2009  . FATIGUE 12/05/2007  . FIBROMYALGIA 06/26/2008  . GASTROENTERITIS, ACUTE 12/15/2008  . GERD 06/26/2008  . Headache(784.0) 09/13/2007  . HYPERLIPIDEMIA 09/13/2007  . JOINT EFFUSION, RIGHT KNEE 07/29/2009  . KNEE PAIN, LEFT 11/21/2010  . LUMBAR RADICULOPATHY, LEFT 06/26/2008  . OTHER DISEASE OF PHARYNX OR NASOPHARYNX 07/03/2008  . PERIPHERAL EDEMA 01/06/2009  . POLYARTHRITIS 11/21/2010  . SHOULDER PAIN, BILATERAL 07/03/2008  . SINUSITIS- ACUTE-NOS 07/29/2009  . TACHYCARDIA 12/05/2007  . THYROID NODULE, RIGHT 11/20/2008  . TREMOR 01/02/2008  . Vertigo     Past Surgical History:  Procedure Laterality Date  . back surgury     lumbar 2001  . CESAREAN SECTION     x 3  . thyroid fine needle aspiration  May 2008   showed non neoplastic goiter  . VAGINAL HYSTERECTOMY     fibroids    Family History  Problem Relation Age of Onset  . Thyroid disease Mother   . Diabetes Mother   . Asthma Mother   . Hypertension Father   . Hyperlipidemia Father   . Diabetes Father   .  Emphysema Other   . Coronary artery disease Other   . Cancer Other        pancreatic and breast cancer  . Cancer Other        lung and prostate cancer  . Breast cancer Neg Hx     Social History   Socioeconomic History  . Marital status: Significant Other    Spouse name: Not on file  . Number of children: 3  . Years of education: Not on file  . Highest education level: Not on file  Occupational History    Employer: UNEMPLOYED  Tobacco Use  . Smoking status: Never Smoker  . Smokeless tobacco: Never Used  Substance and Sexual Activity  . Alcohol use: No  . Drug use: No  . Sexual activity: Not on file  Other Topics Concern  . Not on file  Social History Narrative  . Not on file   Social Determinants of Health   Financial Resource Strain:   . Difficulty of Paying Living Expenses: Not on file  Food Insecurity:   . Worried About Charity fundraiser in the Last Year: Not on file  . Ran Out of Food in the Last Year: Not on file  Transportation Needs:   . Lack of Transportation (Medical): Not on file  . Lack of Transportation (Non-Medical): Not on file  Physical Activity:   . Days of Exercise per Week: Not on file  .  Minutes of Exercise per Session: Not on file  Stress:   . Feeling of Stress : Not on file  Social Connections:   . Frequency of Communication with Friends and Family: Not on file  . Frequency of Social Gatherings with Friends and Family: Not on file  . Attends Religious Services: Not on file  . Active Member of Clubs or Organizations: Not on file  . Attends Archivist Meetings: Not on file  . Marital Status: Not on file  Intimate Partner Violence:   . Fear of Current or Ex-Partner: Not on file  . Emotionally Abused: Not on file  . Physically Abused: Not on file  . Sexually Abused: Not on file     PHYSICAL EXAM:  VS: There were no vitals taken for this visit. Physical Exam Gen: NAD, alert, cooperative with exam, well-appearing MSK:  ***       ASSESSMENT & PLAN:   No problem-specific Assessment & Plan notes found for this encounter.

## 2019-12-25 ENCOUNTER — Encounter (HOSPITAL_COMMUNITY): Admission: RE | Admit: 2019-12-25 | Payer: BC Managed Care – PPO | Source: Ambulatory Visit

## 2019-12-25 ENCOUNTER — Encounter (HOSPITAL_COMMUNITY)
Admission: RE | Admit: 2019-12-25 | Discharge: 2019-12-25 | Disposition: A | Discharge status: Home / Self Care | Payer: BC Managed Care – PPO | Source: Ambulatory Visit | Attending: Gastroenterology | Admitting: Gastroenterology

## 2019-12-25 ENCOUNTER — Other Ambulatory Visit: Payer: Self-pay

## 2019-12-25 ENCOUNTER — Ambulatory Visit (HOSPITAL_COMMUNITY)
Admission: RE | Admit: 2019-12-25 | Discharge: 2019-12-25 | Disposition: A | Discharge status: Home / Self Care | Payer: BC Managed Care – PPO | Source: Ambulatory Visit | Attending: Gastroenterology | Admitting: Gastroenterology

## 2019-12-25 DIAGNOSIS — E119 Type 2 diabetes mellitus without complications: Secondary | ICD-10-CM | POA: Diagnosis not present

## 2019-12-25 DIAGNOSIS — R1011 Right upper quadrant pain: Secondary | ICD-10-CM | POA: Insufficient documentation

## 2019-12-25 DIAGNOSIS — K760 Fatty (change of) liver, not elsewhere classified: Secondary | ICD-10-CM | POA: Diagnosis not present

## 2019-12-25 MED ORDER — TECHNETIUM TC 99M MEBROFENIN IV KIT
5.0000 | PACK | Freq: Once | INTRAVENOUS | Status: AC | PRN
  Administered 2019-12-25: 16:00:00 5 via INTRAVENOUS

## 2020-01-07 ENCOUNTER — Other Ambulatory Visit: Payer: Self-pay

## 2020-01-07 ENCOUNTER — Emergency Department (HOSPITAL_BASED_OUTPATIENT_CLINIC_OR_DEPARTMENT_OTHER)
Admission: EM | Admit: 2020-01-07 | Discharge: 2020-01-07 | Disposition: A | Discharge status: Home / Self Care | Payer: BC Managed Care – PPO | Attending: Emergency Medicine | Admitting: Emergency Medicine

## 2020-01-07 ENCOUNTER — Encounter (HOSPITAL_BASED_OUTPATIENT_CLINIC_OR_DEPARTMENT_OTHER): Payer: Self-pay

## 2020-01-07 DIAGNOSIS — I10 Essential (primary) hypertension: Secondary | ICD-10-CM | POA: Insufficient documentation

## 2020-01-07 DIAGNOSIS — Z91041 Radiographic dye allergy status: Secondary | ICD-10-CM | POA: Insufficient documentation

## 2020-01-07 DIAGNOSIS — Z888 Allergy status to other drugs, medicaments and biological substances status: Secondary | ICD-10-CM | POA: Insufficient documentation

## 2020-01-07 DIAGNOSIS — Z79899 Other long term (current) drug therapy: Secondary | ICD-10-CM | POA: Diagnosis not present

## 2020-01-07 DIAGNOSIS — E785 Hyperlipidemia, unspecified: Secondary | ICD-10-CM | POA: Diagnosis not present

## 2020-01-07 DIAGNOSIS — Z882 Allergy status to sulfonamides status: Secondary | ICD-10-CM | POA: Diagnosis not present

## 2020-01-07 DIAGNOSIS — Z7984 Long term (current) use of oral hypoglycemic drugs: Secondary | ICD-10-CM | POA: Diagnosis not present

## 2020-01-07 DIAGNOSIS — K529 Noninfective gastroenteritis and colitis, unspecified: Secondary | ICD-10-CM | POA: Diagnosis not present

## 2020-01-07 DIAGNOSIS — R197 Diarrhea, unspecified: Secondary | ICD-10-CM | POA: Diagnosis present

## 2020-01-07 DIAGNOSIS — E119 Type 2 diabetes mellitus without complications: Secondary | ICD-10-CM | POA: Diagnosis not present

## 2020-01-07 DIAGNOSIS — M797 Fibromyalgia: Secondary | ICD-10-CM | POA: Insufficient documentation

## 2020-01-07 LAB — COMPREHENSIVE METABOLIC PANEL
ALT: 25 U/L (ref 0–44)
AST: 20 U/L (ref 15–41)
Albumin: 3.9 g/dL (ref 3.5–5.0)
Alkaline Phosphatase: 104 U/L (ref 38–126)
Anion gap: 9 (ref 5–15)
BUN: 8 mg/dL (ref 6–20)
CO2: 24 mmol/L (ref 22–32)
Calcium: 9.6 mg/dL (ref 8.9–10.3)
Chloride: 100 mmol/L (ref 98–111)
Creatinine, Ser: 0.6 mg/dL (ref 0.44–1.00)
GFR calc Af Amer: >60 mL/min (ref >60)
GFR calc non Af Amer: >60 mL/min (ref >60)
Glucose, Bld: 328 mg/dL — ABNORMAL HIGH (ref 70–99)
Potassium: 4 mmol/L (ref 3.5–5.1)
Sodium: 133 mmol/L — ABNORMAL LOW (ref 135–145)
Total Bilirubin: 0.6 mg/dL (ref 0.3–1.2)
Total Protein: 8 g/dL (ref 6.5–8.1)

## 2020-01-07 LAB — CBC WITH DIFFERENTIAL/PLATELET
Abs Immature Granulocytes: 0.01 10*3/uL (ref 0.00–0.07)
Basophils Absolute: 0.1 10*3/uL (ref 0.0–0.1)
Basophils Relative: 1 %
Eosinophils Absolute: 0.1 10*3/uL (ref 0.0–0.5)
Eosinophils Relative: 2 %
HCT: 44.3 % (ref 36.0–46.0)
Hemoglobin: 14.4 g/dL (ref 12.0–15.0)
Immature Granulocytes: 0 %
Lymphocytes Relative: 26 %
Lymphs Abs: 1.5 10*3/uL (ref 0.7–4.0)
MCH: 27.8 pg (ref 26.0–34.0)
MCHC: 32.5 g/dL (ref 30.0–36.0)
MCV: 85.5 fL (ref 80.0–100.0)
Monocytes Absolute: 0.6 10*3/uL (ref 0.1–1.0)
Monocytes Relative: 10 %
Neutro Abs: 3.4 10*3/uL (ref 1.7–7.7)
Neutrophils Relative %: 61 %
Platelets: 342 10*3/uL (ref 150–400)
RBC: 5.18 MIL/uL — ABNORMAL HIGH (ref 3.87–5.11)
RDW: 13.2 % (ref 11.5–15.5)
WBC: 5.7 10*3/uL (ref 4.0–10.5)
nRBC: 0 % (ref 0.0–0.2)

## 2020-01-07 LAB — LIPASE, BLOOD: Lipase: 27 U/L (ref 11–51)

## 2020-01-07 MED ORDER — ONDANSETRON 8 MG PO TBDP: ORAL_TABLET | ORAL | 0 refills | Status: DC

## 2020-01-07 MED ORDER — SODIUM CHLORIDE 0.9 % IV BOLUS
1000.0000 mL | Freq: Once | INTRAVENOUS | Status: AC
  Administered 2020-01-07: 1000 mL via INTRAVENOUS

## 2020-01-07 MED ORDER — ONDANSETRON HCL 4 MG/2ML IJ SOLN
4.0000 mg | Freq: Once | INTRAMUSCULAR | Status: AC
  Administered 2020-01-07: 4 mg via INTRAVENOUS
  Filled 2020-01-07: qty 2

## 2020-01-07 MED ORDER — KETOROLAC TROMETHAMINE 30 MG/ML IJ SOLN
30.0000 mg | Freq: Once | INTRAMUSCULAR | Status: AC
  Administered 2020-01-07: 30 mg via INTRAVENOUS
  Filled 2020-01-07: qty 1

## 2020-01-07 NOTE — Discharge Instructions (Addendum)
Zofran as prescribed as needed for nausea.  Clear liquid diet for the next 12 hours, then advance as tolerated.  Return to the emergency department if you develop severe abdominal pain, high fever, bloody stool, or other new and concerning symptoms.

## 2020-01-07 NOTE — ED Triage Notes (Signed)
Pt states that she has been on a GI sample medication, unsure of the name of the medication states that she has been having diarrhea since Monday, thought it may be the medication, also reports her boyfriend has also had diarrhea.

## 2020-01-07 NOTE — ED Notes (Signed)
Unable to provide urine at this time.

## 2020-01-07 NOTE — ED Provider Notes (Addendum)
[facility] EMERGENCY DEPARTMENT Provider Note   CSN: [id number] Arrival date & time: 01/07/20  [id number]     History Chief Complaint  Patient presents with  . Diarrhea    Shirley [name] is a 44 y.o. female.  Patient is a 44 year old female with past medical history of diabetes, fibromyalgia, bipolar, anemia.  She presents today for evaluation of nausea, vomiting, diarrhea.  This is been occurring intermittently for the past 3 days.  Her boyfriend is ill with similar symptoms.  Patient denies any bloody stool or vomit.  She denies any fevers or chills.  She reports intermittent abdominal cramping and bloating, but denies any abdominal pain.  The history is provided by the patient.  Diarrhea Severity:  Moderate Onset quality:  Sudden Duration:  3 days Progression:  Unchanged Relieved by:  Nothing Worsened by:  Nothing Associated symptoms: no abdominal pain, no diaphoresis and no fever        Past Medical History:  Diagnosis Date  . ADHD (attention deficit hyperactivity disorder)   . ALLERGIC RHINITIS 06/26/2008  . ANEMIA-IRON DEFICIENCY 06/26/2008  . 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  . ANKLE PAIN, LEFT 06/26/2008  . BACK PAIN, LUMBAR 10/15/2007  . BIPOLAR DISORDER UNSPECIFIED 09/13/2007  . CERVICAL RADICULOPATHY, LEFT 06/26/2008  . Cervicalgia 10/15/2007  . Chronic pain syndrome 03/21/2011  . COMMON MIGRAINE 06/26/2008  . DIABETES MELLITUS, TYPE II 06/05/2007  . DYSPNEA 12/05/2007  . EAR PAIN, RIGHT 12/05/2007  . ELEVATED BP READING WITHOUT DX HYPERTENSION 07/29/2009  . FATIGUE 12/05/2007  . FIBROMYALGIA 06/26/2008  . GASTROENTERITIS, ACUTE 12/15/2008  . GERD 06/26/2008  . Headache(784.0) 09/13/2007  . HYPERLIPIDEMIA 09/13/2007  . JOINT EFFUSION, RIGHT KNEE 07/29/2009  . KNEE PAIN, LEFT 11/21/2010  . LUMBAR RADICULOPATHY, LEFT 06/26/2008  . OTHER DISEASE OF PHARYNX OR NASOPHARYNX 07/03/2008  . PERIPHERAL EDEMA 01/06/2009  . POLYARTHRITIS 11/21/2010  . SHOULDER PAIN, BILATERAL 07/03/2008  .  SINUSITIS- ACUTE-NOS 07/29/2009  . TACHYCARDIA 12/05/2007  . THYROID NODULE, RIGHT 11/20/2008  . TREMOR 01/02/2008  . Vertigo     Patient Active Problem List   Diagnosis Date Noted  . Toe injury, left, initial encounter 05/22/2019  . Right foot injury, initial encounter 02/08/2018  . Bilateral knee pain 01/14/2018  . MRSA (methicillin resistant staph aureus) urine culture positive on 07/06/16 07/06/2016  . Foot injury 12/20/2015  . Thoracic back pain 12/20/2015  . Low back pain radiating to right leg 12/01/2015  . Shoulder impingement syndrome, left 11/07/2013  . Left flank pain 11/07/2013  . Encounter for long-term (current) use of high-risk medication 05/29/2011  . Preventative health care 05/28/2011  . Chronic pain syndrome 03/21/2011  . KNEE PAIN, LEFT 11/21/2010  . POLYARTHRITIS 11/21/2010  . BACK PAIN 11/22/2009  . JOINT EFFUSION, RIGHT KNEE 07/29/2009  . ELEVATED BP READING WITHOUT DX HYPERTENSION 07/29/2009  . 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  . PERIPHERAL EDEMA 01/06/2009  . GASTROENTERITIS, ACUTE 12/15/2008  . DYSPHAGIA UNSPECIFIED 11/23/2008  . THYROID NODULE, RIGHT 11/20/2008  . SHOULDER PAIN, BILATERAL 07/03/2008  . ANEMIA-IRON DEFICIENCY 06/26/2008  . COMMON MIGRAINE 06/26/2008  . ALLERGIC RHINITIS 06/26/2008  . GERD 06/26/2008  . ANKLE PAIN, LEFT 06/26/2008  . CERVICAL RADICULOPATHY, LEFT 06/26/2008  . LUMBAR RADICULOPATHY, LEFT 06/26/2008  . FIBROMYALGIA 06/26/2008  . TREMOR 01/02/2008  . EAR PAIN, RIGHT 12/05/2007  . FATIGUE 12/05/2007  . TACHYCARDIA 12/05/2007  . DYSPNEA 12/05/2007  . Neck pain 10/15/2007  . BACK PAIN, LUMBAR 10/15/2007  . Pain in Soft Tissues of Limb 10/15/2007  .  Hyperlipidemia 09/13/2007  . BIPOLAR DISORDER UNSPECIFIED 09/13/2007  . Headache(784.0) 09/13/2007  . Diabetes ([place]) 06/05/2007    Past Surgical History:  Procedure Laterality Date  . back surgury     lumbar 2001  . CESAREAN SECTION     x 3  . thyroid fine needle aspiration   May 2008   showed non neoplastic goiter  . VAGINAL HYSTERECTOMY     fibroids     OB History    Gravida  3   Para  3   Term  2   Preterm  1   AB      Living  3     SAB      TAB      Ectopic      Multiple      Live Births  3           Family History  Problem Relation Age of Onset  . Thyroid disease Mother   . Diabetes Mother   . Asthma Mother   . Hypertension Father   . Hyperlipidemia Father   . Diabetes Father   . Emphysema Other   . Coronary artery disease Other   . Cancer Other        pancreatic and breast cancer  . Cancer Other        lung and prostate cancer  . Breast cancer Neg Hx     Social History   Tobacco Use  . Smoking status: Never Smoker  . Smokeless tobacco: Never Used  Substance Use Topics  . Alcohol use: No  . Drug use: No    Home Medications Prior to Admission medications   Medication Sig Start Date End Date Taking? Authorizing Provider  cyclobenzaprine (FLEXERIL) 10 MG tablet Take 1 tablet (10 mg total) by mouth 3 (three) times daily as needed for muscle spasms. 03/10/19   Hudnall, Sharyn Lull, MD  estradiol (ESTRACE) 0.5 MG tablet Take 1 tablet (0.5 mg total) by mouth daily. 02/03/19   Truett Mainland, DO  metFORMIN (GLUCOPHAGE) 500 MG tablet Take 1 tablet (500 mg total) by mouth 2 (two) times daily with a meal. 01/11/18   Truett Mainland, DO  naproxen (NAPROSYN) 500 MG tablet Take 1 tablet (500 mg total) by mouth 2 (two) times daily as needed. 02/11/19   Hudnall, Sharyn Lull, MD  predniSONE (DELTASONE) 5 MG tablet Take 6 pills for first day, 5 pills second day, 4 pills third day, 3 pills fourth day, 2 pills the fifth day, and 1 pill sixth day. 11/19/19   Rosemarie Ax, MD  progesterone (PROMETRIUM) 200 MG capsule Take 1 capsule (200 mg total) by mouth daily. 01/30/19   Truett Mainland, DO    Allergies    Sulfa antibiotics, Contrast media [iodinated diagnostic agents], Promethazine hcl, and Reglan [metoclopramide]  Review of Systems    Review of Systems  Constitutional: Negative for diaphoresis and fever.  Gastrointestinal: Positive for diarrhea. Negative for abdominal pain.  All other systems reviewed and are negative.   Physical Exam Updated Vital Signs BP (!) 147/93 (BP Location: Right Arm)   Pulse 99   Temp 98.2 F (36.8 C) (Oral)   Resp 18   Ht 5\' 5"  (1.651 m)   Wt 90.7 kg   SpO2 100%   BMI 33.28 kg/m   Physical Exam Vitals and nursing note reviewed.  Constitutional:      General: She is not in acute distress.    Appearance: She is well-developed. She  is not diaphoretic.  HENT:     Head: Normocephalic and atraumatic.     Mouth/Throat:     Mouth: Mucous membranes are moist.  Cardiovascular:     Rate and Rhythm: Normal rate and regular rhythm.     Heart sounds: No murmur. No friction rub. No gallop.   Pulmonary:     Effort: Pulmonary effort is normal. No respiratory distress.     Breath sounds: Normal breath sounds. No wheezing.  Abdominal:     General: Bowel sounds are normal. There is no distension.     Palpations: Abdomen is soft.     Tenderness: There is no abdominal tenderness.  Musculoskeletal:        General: Normal range of motion.     Cervical back: Normal range of motion and neck supple.     Right lower leg: No edema.     Left lower leg: No edema.  Skin:    General: Skin is warm and dry.  Neurological:     Mental Status: She is alert and oriented to person, place, and time.     ED Results / Procedures / Treatments   Labs (all labs ordered are listed, but only abnormal results are displayed) Labs Reviewed  CBC WITH DIFFERENTIAL/PLATELET  COMPREHENSIVE METABOLIC PANEL  LIPASE, BLOOD    EKG None  Radiology No results found.  Procedures Procedures (including critical care time)  Medications Ordered in ED Medications  sodium chloride 0.9 % bolus 1,000 mL (has no administration in time range)  ondansetron (ZOFRAN) injection 4 mg (has no administration in time range)    ketorolac (TORADOL) 30 MG/ML injection 30 mg (has no administration in time range)    ED Course  I have reviewed the triage vital signs and the nursing notes.  Pertinent labs & imaging results that were available during my care of the patient were reviewed by me and considered in my medical decision making (see chart for details).    MDM Rules/Calculators/A&P  Patient is a 44 year old female presenting with complaints of diarrhea for the past 2-1/2 days.  All of this has been nonbloody.  She denies abdominal pain and her abdominal exam is benign.  Laboratory studies are reassuring.  Patient hydrated with normal saline and is actually feeling significantly improved.  Her significant other has similar symptoms and I suspect a viral etiology.  Patient appears appropriate for discharge with Zofran and return as needed.  Final Clinical Impression(s) / ED Diagnoses Final diagnoses:  None    Rx / DC Orders ED Discharge Orders    None       Veryl Speak, MD 01/07/20 1104    Veryl Speak, MD 01/07/20 1105

## 2020-03-23 ENCOUNTER — Ambulatory Visit (INDEPENDENT_AMBULATORY_CARE_PROVIDER_SITE_OTHER): Payer: BC Managed Care – PPO | Admitting: Family Medicine

## 2020-03-23 ENCOUNTER — Encounter: Payer: Self-pay | Admitting: Family Medicine

## 2020-03-23 ENCOUNTER — Other Ambulatory Visit: Payer: Self-pay

## 2020-03-23 DIAGNOSIS — M797 Fibromyalgia: Secondary | ICD-10-CM

## 2020-03-23 MED ORDER — METHYLPREDNISOLONE ACETATE 40 MG/ML IJ SUSP
40.0000 mg | Freq: Once | INTRAMUSCULAR | Status: AC
  Administered 2020-03-23: 40 mg via INTRAMUSCULAR

## 2020-03-23 MED ORDER — KETOROLAC TROMETHAMINE 30 MG/ML IJ SOLN
30.0000 mg | Freq: Once | INTRAMUSCULAR | Status: AC
  Administered 2020-03-23: 30 mg via INTRAMUSCULAR

## 2020-03-23 NOTE — Assessment & Plan Note (Signed)
May have an acute flare of her fibromyalgia.  Seems less likely for polymyalgia -Counseled on home exercise therapy and supportive care. -IM Depo. -IM Toradol. -Could consider physical therapy or lab work if no improvement.

## 2020-03-23 NOTE — Progress Notes (Signed)
[name] - 44 y.o. female MRN [id number]  Date of birth: 1976/04/12  SUBJECTIVE:  Including CC & ROS.  Chief Complaint  Patient presents with  . Back Pain    upper to low back  . Neck Pain  . Leg Pain    bilateral    Shirley [name] is a 44 y.o. female that is presenting with worsening global body pain.  She cannot identify one specific spot.  Has been ongoing over the past few weeks but has gotten worse over the past few days.  No improvement with modalities today.  She does has a history of fibromyalgia.  Has had associated stress recently.  She is picking up more boxes and things at work.   Review of Systems See HPI   HISTORY: Past Medical, Surgical, Social, and Family History Reviewed & Updated per EMR.   Pertinent Historical Findings include:  Past Medical History:  Diagnosis Date  . ADHD (attention deficit hyperactivity disorder)   . ALLERGIC RHINITIS 06/26/2008  . ANEMIA-IRON DEFICIENCY 06/26/2008  . 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  . ANKLE PAIN, LEFT 06/26/2008  . BACK PAIN, LUMBAR 10/15/2007  . BIPOLAR DISORDER UNSPECIFIED 09/13/2007  . CERVICAL RADICULOPATHY, LEFT 06/26/2008  . Cervicalgia 10/15/2007  . Chronic pain syndrome 03/21/2011  . COMMON MIGRAINE 06/26/2008  . DIABETES MELLITUS, TYPE II 06/05/2007  . DYSPNEA 12/05/2007  . EAR PAIN, RIGHT 12/05/2007  . ELEVATED BP READING WITHOUT DX HYPERTENSION 07/29/2009  . FATIGUE 12/05/2007  . FIBROMYALGIA 06/26/2008  . GASTROENTERITIS, ACUTE 12/15/2008  . GERD 06/26/2008  . Headache(784.0) 09/13/2007  . HYPERLIPIDEMIA 09/13/2007  . JOINT EFFUSION, RIGHT KNEE 07/29/2009  . KNEE PAIN, LEFT 11/21/2010  . LUMBAR RADICULOPATHY, LEFT 06/26/2008  . OTHER DISEASE OF PHARYNX OR NASOPHARYNX 07/03/2008  . PERIPHERAL EDEMA 01/06/2009  . POLYARTHRITIS 11/21/2010  . SHOULDER PAIN, BILATERAL 07/03/2008  . SINUSITIS- ACUTE-NOS 07/29/2009  . TACHYCARDIA 12/05/2007  . THYROID NODULE, RIGHT 11/20/2008  . TREMOR 01/02/2008  . Vertigo     Past Surgical  History:  Procedure Laterality Date  . back surgury     lumbar 2001  . CESAREAN SECTION     x 3  . thyroid fine needle aspiration  May 2008   showed non neoplastic goiter  . VAGINAL HYSTERECTOMY     fibroids    Family History  Problem Relation Age of Onset  . Thyroid disease Mother   . Diabetes Mother   . Asthma Mother   . Hypertension Father   . Hyperlipidemia Father   . Diabetes Father   . Emphysema Other   . Coronary artery disease Other   . Cancer Other        pancreatic and breast cancer  . Cancer Other        lung and prostate cancer  . Breast cancer Neg Hx     Social History   Socioeconomic History  . Marital status: Significant Other    Spouse name: Not on file  . Number of children: 3  . Years of education: Not on file  . Highest education level: Not on file  Occupational History    Employer: UNEMPLOYED  Tobacco Use  . Smoking status: Never Smoker  . Smokeless tobacco: Never Used  Substance and Sexual Activity  . Alcohol use: No  . Drug use: No  . Sexual activity: Not on file  Other Topics Concern  . Not on file  Social History Narrative  . Not on file   Social Determinants of Health  Financial Resource Strain:   . Difficulty of Paying Living Expenses:   Food Insecurity:   . Worried About Charity fundraiser in the Last Year:   . Arboriculturist in the Last Year:   Transportation Needs:   . Film/video editor (Medical):   Marland Kitchen Lack of Transportation (Non-Medical):   Physical Activity:   . Days of Exercise per Week:   . Minutes of Exercise per Session:   Stress:   . Feeling of Stress :   Social Connections:   . Frequency of Communication with Friends and Family:   . Frequency of Social Gatherings with Friends and Family:   . Attends Religious Services:   . Active Member of Clubs or Organizations:   . Attends Archivist Meetings:   Marland Kitchen Marital Status:   Intimate Partner Violence:   . Fear of Current or Ex-Partner:   .  Emotionally Abused:   Marland Kitchen Physically Abused:   . Sexually Abused:      PHYSICAL EXAM:  VS: Ht 5\' 4"  (1.626 m)   Wt 210 lb (95.3 kg)   BMI 36.05 kg/m  Physical Exam Gen: NAD, alert, cooperative with exam, well-appearing MSK:  Normal neck range of motion. Normal shoulder range of motion. Normal back range of motion. Normal grip strength. Normal strength in lower extremity. Neurovascularly intact     ASSESSMENT & PLAN:   Fibromyalgia May have an acute flare of her fibromyalgia.  Seems less likely for polymyalgia -Counseled on home exercise therapy and supportive care. -IM Depo. -IM Toradol. -Could consider physical therapy or lab work if no improvement.

## 2020-03-23 NOTE — Patient Instructions (Signed)
Good to see you Please try relaxation techniques  Please try heat  Please try light activity  Please let me know if your pain isn't improved   Please send me a message in MyChart with any questions or updates.  Please see me back in 4 weeks or sooner if needed.   --Dr. Raeford Razor

## 2020-03-29 ENCOUNTER — Encounter: Payer: Self-pay | Admitting: Family Medicine

## 2020-03-31 ENCOUNTER — Other Ambulatory Visit: Payer: Self-pay | Admitting: Family Medicine

## 2020-03-31 MED ORDER — GABAPENTIN 100 MG PO CAPS: 100.0000 mg | ORAL_CAPSULE | Freq: Three times a day (TID) | ORAL | 1 refills | Status: DC | PRN

## 2020-04-08 ENCOUNTER — Other Ambulatory Visit: Payer: Self-pay | Admitting: Family Medicine

## 2020-04-09 ENCOUNTER — Telehealth: Payer: Self-pay

## 2020-04-09 NOTE — Telephone Encounter (Signed)
Patient needs apppointment for breast issue. Kathrene Alu RN

## 2020-04-12 ENCOUNTER — Other Ambulatory Visit: Payer: Self-pay

## 2020-04-12 ENCOUNTER — Other Ambulatory Visit: Payer: Self-pay | Admitting: Family Medicine

## 2020-04-12 ENCOUNTER — Ambulatory Visit (INDEPENDENT_AMBULATORY_CARE_PROVIDER_SITE_OTHER): Payer: BC Managed Care – PPO | Admitting: Family Medicine

## 2020-04-12 ENCOUNTER — Encounter: Payer: Self-pay | Admitting: Family Medicine

## 2020-04-12 VITALS — BP 122/84 | HR 100 | Ht 64.0 in | Wt 196.0 lb

## 2020-04-12 DIAGNOSIS — D242 Benign neoplasm of left breast: Secondary | ICD-10-CM

## 2020-04-12 DIAGNOSIS — D241 Benign neoplasm of right breast: Secondary | ICD-10-CM

## 2020-04-12 DIAGNOSIS — D249 Benign neoplasm of unspecified breast: Secondary | ICD-10-CM

## 2020-04-12 NOTE — Progress Notes (Signed)
   Subjective:    Patient ID: Shirley [name], female    DOB: [birth year], 44 y.o.   MRN: [id number]  HPI Patient seen for breast tenderness on right outer breast. She thinks she feels a lump there. Started a few days ago.   Review of Systems     Objective:   Physical Exam Vitals reviewed. Exam conducted with a chaperone present.  Constitutional:      Appearance: Normal appearance.  Chest:    Neurological:     Mental Status: She is alert.        Assessment & Plan:  1. Breast fibroadenoma in female, unspecified laterality Diagnostic mammogram with [name]. - US BREAST LTD UNI LEFT INC AXILLA; Future - US BREAST LTD UNI RIGHT INC AXILLA; Future

## 2020-04-20 ENCOUNTER — Ambulatory Visit (INDEPENDENT_AMBULATORY_CARE_PROVIDER_SITE_OTHER): Payer: BC Managed Care – PPO | Admitting: Family Medicine

## 2020-04-20 ENCOUNTER — Encounter: Payer: Self-pay | Admitting: Family Medicine

## 2020-04-20 ENCOUNTER — Other Ambulatory Visit: Payer: Self-pay

## 2020-04-20 VITALS — BP 129/91 | HR 99 | Ht 64.0 in | Wt 196.0 lb

## 2020-04-20 DIAGNOSIS — M546 Pain in thoracic spine: Secondary | ICD-10-CM | POA: Diagnosis not present

## 2020-04-20 DIAGNOSIS — E119 Type 2 diabetes mellitus without complications: Secondary | ICD-10-CM

## 2020-04-20 DIAGNOSIS — M797 Fibromyalgia: Secondary | ICD-10-CM

## 2020-04-20 NOTE — Assessment & Plan Note (Signed)
Pain seems musculoskeletal in nature.  Seems less likely for aortic dissection or pancreatitis. -X-ray. -Could consider CT if needed.

## 2020-04-20 NOTE — Assessment & Plan Note (Signed)
Uncontrolled.  Most recent A1c has been significantly elevated.  May be contributing some of her symptoms. -Hemoglobin A1c.

## 2020-04-20 NOTE — Progress Notes (Signed)
Shirley [name] - 44 y.o. female MRN [id number]  Date of birth: [birth year]  SUBJECTIVE:  Including CC & ROS.  Chief Complaint  Patient presents with  . Follow-up    neck,upper back, bilateral leg  . Knee Pain    right    Shirley [name] is a 44 y.o. female that is presenting with thoracic back pain and right knee pain.  The pain is acute on chronic in nature.  This pain feels different than previously.  It originates in the center of her thoracic back and tends to spread peripherally.  Gabapentin does help some.  Seems to be worse with any lifting.  The right knee pain is anterior nature.  Is worse with getting up from a seated position.  Independent review of the right knee x-ray from 2019 shows no structural changes.   Review of Systems See HPI   HISTORY: Past Medical, Surgical, Social, and Family History Reviewed & Updated per EMR.   Pertinent Historical Findings include:  Past Medical History:  Diagnosis Date  . ADHD (attention deficit hyperactivity disorder)   . ALLERGIC RHINITIS 06/26/2008  . ANEMIA-IRON DEFICIENCY 06/26/2008  . 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  . ANKLE PAIN, LEFT 06/26/2008  . BACK PAIN, LUMBAR 10/15/2007  . BIPOLAR DISORDER UNSPECIFIED 09/13/2007  . CERVICAL RADICULOPATHY, LEFT 06/26/2008  . Cervicalgia 10/15/2007  . Chronic pain syndrome 03/21/2011  . COMMON MIGRAINE 06/26/2008  . DIABETES MELLITUS, TYPE II 06/05/2007  . DYSPNEA 12/05/2007  . EAR PAIN, RIGHT 12/05/2007  . ELEVATED BP READING WITHOUT DX HYPERTENSION 07/29/2009  . FATIGUE 12/05/2007  . FIBROMYALGIA 06/26/2008  . GASTROENTERITIS, ACUTE 12/15/2008  . GERD 06/26/2008  . Headache(784.0) 09/13/2007  . HYPERLIPIDEMIA 09/13/2007  . JOINT EFFUSION, RIGHT KNEE 07/29/2009  . KNEE PAIN, LEFT 11/21/2010  . LUMBAR RADICULOPATHY, LEFT 06/26/2008  . OTHER DISEASE OF PHARYNX OR NASOPHARYNX 07/03/2008  . PERIPHERAL EDEMA 01/06/2009  . POLYARTHRITIS 11/21/2010  . SHOULDER PAIN, BILATERAL 07/03/2008  . SINUSITIS- ACUTE-NOS  07/29/2009  . TACHYCARDIA 12/05/2007  . THYROID NODULE, RIGHT 11/20/2008  . TREMOR 01/02/2008  . Vertigo     Past Surgical History:  Procedure Laterality Date  . back surgury     lumbar 2001  . CESAREAN SECTION     x 3  . thyroid fine needle aspiration  May 2008   showed non neoplastic goiter  . VAGINAL HYSTERECTOMY     fibroids    Family History  Problem Relation Age of Onset  . Thyroid disease Mother   . Diabetes Mother   . Asthma Mother   . Hypertension Father   . Hyperlipidemia Father   . Diabetes Father   . Emphysema Other   . Coronary artery disease Other   . Cancer Other        pancreatic and breast cancer  . Cancer Other        lung and prostate cancer  . Breast cancer Neg Hx     Social History   Socioeconomic History  . Marital status: Significant Other    Spouse name: Not on file  . Number of children: 3  . Years of education: Not on file  . Highest education level: Not on file  Occupational History    Employer: UNEMPLOYED  Tobacco Use  . Smoking status: Never Smoker  . Smokeless tobacco: Never Used  Substance and Sexual Activity  . Alcohol use: No  . Drug use: No  . Sexual activity: Not on file  Other Topics Concern  . Not  on file  Social History Narrative  . Not on file   Social Determinants of Health   Financial Resource Strain:   . Difficulty of Paying Living Expenses:   Food Insecurity:   . Worried About Charity fundraiser in the Last Year:   . Arboriculturist in the Last Year:   Transportation Needs:   . Film/video editor (Medical):   Marland Kitchen Lack of Transportation (Non-Medical):   Physical Activity:   . Days of Exercise per Week:   . Minutes of Exercise per Session:   Stress:   . Feeling of Stress :   Social Connections:   . Frequency of Communication with Friends and Family:   . Frequency of Social Gatherings with Friends and Family:   . Attends Religious Services:   . Active Member of Clubs or Organizations:   . Attends English as a second language teacher Meetings:   Marland Kitchen Marital Status:   Intimate Partner Violence:   . Fear of Current or Ex-Partner:   . Emotionally Abused:   Marland Kitchen Physically Abused:   . Sexually Abused:      PHYSICAL EXAM:  VS: BP (!) 129/91   Pulse 99   Ht 5\' 4"  (1.626 m)   Wt 196 lb (88.9 kg)   BMI 33.64 kg/m  Physical Exam Gen: NAD, alert, cooperative with exam, well-appearing MSK:  Back: Normal shoulder range of motion. Normal neck range of motion. No signs of atrophy. Right knee: No effusion. Normal range of motion. Neurovascularly intact     ASSESSMENT & PLAN:   Type 2 diabetes mellitus without complication, without long-term current use of insulin (HCC) Uncontrolled.  Most recent A1c has been significantly elevated.  May be contributing some of her symptoms. -Hemoglobin A1c.  Fibromyalgia Has tried different medications in the past help control the pain.  This may be more related to diabetes as opposed to just fibromyalgia. -ANA panel, sed rate, CRP, CK -Could consider nortriptyline. -Continue gabapentin for now.  Thoracic back pain Pain seems musculoskeletal in nature.  Seems less likely for aortic dissection or pancreatitis. -X-ray. -Could consider CT if needed.

## 2020-04-20 NOTE — Patient Instructions (Signed)
Good to see you Please try heat on the back  Please try ice on the knee  Please try the exercises  I will call with the results from today   Please send me a message in MyChart with any questions or updates.  Please see me back in 4 weeks.   --Dr. Raeford Razor

## 2020-04-20 NOTE — Assessment & Plan Note (Signed)
Has tried different medications in the past help control the pain.  This may be more related to diabetes as opposed to just fibromyalgia. -ANA panel, sed rate, CRP, CK -Could consider nortriptyline. -Continue gabapentin for now.

## 2020-04-22 ENCOUNTER — Telehealth: Payer: Self-pay | Admitting: Family Medicine

## 2020-04-22 ENCOUNTER — Other Ambulatory Visit: Payer: Self-pay

## 2020-04-22 ENCOUNTER — Ambulatory Visit (HOSPITAL_BASED_OUTPATIENT_CLINIC_OR_DEPARTMENT_OTHER)
Admission: RE | Admit: 2020-04-22 | Discharge: 2020-04-22 | Disposition: A | Discharge status: Home / Self Care | Payer: BC Managed Care – PPO | Source: Ambulatory Visit | Attending: Family Medicine | Admitting: Family Medicine

## 2020-04-22 DIAGNOSIS — M546 Pain in thoracic spine: Secondary | ICD-10-CM | POA: Diagnosis present

## 2020-04-22 LAB — C-REACTIVE PROTEIN: CRP: 24 mg/L — ABNORMAL HIGH (ref 0–10)

## 2020-04-22 LAB — LIPASE: Lipase: 110 U/L — ABNORMAL HIGH (ref 14–72)

## 2020-04-22 LAB — ANA,IFA RA DIAG PNL W/RFLX TIT/PATN
ANA Titer 1: NEGATIVE
Cyclic Citrullin Peptide Ab: 10 units (ref 0–19)
Rheumatoid fact SerPl-aCnc: <10 IU/mL (ref 0.0–13.9)

## 2020-04-22 LAB — AMYLASE: Amylase: 67 U/L (ref 31–110)

## 2020-04-22 LAB — SEDIMENTATION RATE: Sed Rate: 27 mm/hr (ref 0–32)

## 2020-04-22 LAB — CK: Total CK: 83 U/L (ref 32–182)

## 2020-04-22 LAB — HEMOGLOBIN A1C
Est. average glucose Bld gHb Est-mCnc: 312 mg/dL
Hgb A1c MFr Bld: 12.5 % — ABNORMAL HIGH (ref 4.8–5.6)

## 2020-04-22 NOTE — Telephone Encounter (Signed)
Left VM for patient. If she calls back please have her speak with a nurse/CMA and inform her that her A1c is elevated at 12.5.  This is severely uncontrolled diabetes.  She needs to have close follow-up with her primary care and this may be related to some of her symptoms.  She has an elevated lipase which could be related to pancreatitis or contributing from her diabetes.  She has nonspecific inflammatory markers elevated.  Try to focus on treating her diabetes.  If that does not improve then may need to consider imaging to check for pancreatitis.  If any questions then please take the best time and phone number to call and I will try to call her back.   Rosemarie Ax, MD [facility] 04/22/2020, 8:06 AM

## 2020-04-23 ENCOUNTER — Ambulatory Visit
Admission: RE | Admit: 2020-04-23 | Discharge: 2020-04-23 | Disposition: A | Discharge status: Home / Self Care | Payer: BC Managed Care – PPO | Source: Ambulatory Visit | Attending: Family Medicine | Admitting: Family Medicine

## 2020-04-23 ENCOUNTER — Telehealth: Payer: Self-pay | Admitting: Family Medicine

## 2020-04-23 ENCOUNTER — Other Ambulatory Visit: Payer: Self-pay | Admitting: Family Medicine

## 2020-04-23 DIAGNOSIS — D242 Benign neoplasm of left breast: Secondary | ICD-10-CM

## 2020-04-23 DIAGNOSIS — D249 Benign neoplasm of unspecified breast: Secondary | ICD-10-CM

## 2020-04-23 DIAGNOSIS — R921 Mammographic calcification found on diagnostic imaging of breast: Secondary | ICD-10-CM

## 2020-04-23 DIAGNOSIS — D241 Benign neoplasm of right breast: Secondary | ICD-10-CM

## 2020-04-23 NOTE — Telephone Encounter (Signed)
Informed of results. Counseled on diabetes.   Rosemarie Ax, MD [facility] 04/23/2020, 12:00 PM

## 2020-05-03 ENCOUNTER — Ambulatory Visit
Admission: RE | Admit: 2020-05-03 | Discharge: 2020-05-03 | Disposition: A | Discharge status: Home / Self Care | Payer: BC Managed Care – PPO | Source: Ambulatory Visit | Attending: Family Medicine | Admitting: Family Medicine

## 2020-05-03 ENCOUNTER — Other Ambulatory Visit: Payer: Self-pay

## 2020-05-03 DIAGNOSIS — R921 Mammographic calcification found on diagnostic imaging of breast: Secondary | ICD-10-CM

## 2020-05-17 ENCOUNTER — Ambulatory Visit: Payer: BC Managed Care – PPO | Admitting: Family Medicine

## 2020-05-17 NOTE — Progress Notes (Deleted)
Shirley [name] - 44 y.o. female MRN [id number]  Date of birth: 06/26/1976  SUBJECTIVE:  Including CC & ROS.  No chief complaint on file.   Shirley [name] is a 44 y.o. female that is  ***.  ***   Review of Systems See HPI   HISTORY: Past Medical, Surgical, Social, and Family History Reviewed & Updated per EMR.   Pertinent Historical Findings include:  Past Medical History:  Diagnosis Date  . ADHD (attention deficit hyperactivity disorder)   . ALLERGIC RHINITIS 06/26/2008  . ANEMIA-IRON DEFICIENCY 06/26/2008  . 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  . ANKLE PAIN, LEFT 06/26/2008  . BACK PAIN, LUMBAR 10/15/2007  . BIPOLAR DISORDER UNSPECIFIED 09/13/2007  . CERVICAL RADICULOPATHY, LEFT 06/26/2008  . Cervicalgia 10/15/2007  . Chronic pain syndrome 03/21/2011  . COMMON MIGRAINE 06/26/2008  . DIABETES MELLITUS, TYPE II 06/05/2007  . DYSPNEA 12/05/2007  . EAR PAIN, RIGHT 12/05/2007  . ELEVATED BP READING WITHOUT DX HYPERTENSION 07/29/2009  . FATIGUE 12/05/2007  . FIBROMYALGIA 06/26/2008  . GASTROENTERITIS, ACUTE 12/15/2008  . GERD 06/26/2008  . Headache(784.0) 09/13/2007  . HYPERLIPIDEMIA 09/13/2007  . JOINT EFFUSION, RIGHT KNEE 07/29/2009  . KNEE PAIN, LEFT 11/21/2010  . LUMBAR RADICULOPATHY, LEFT 06/26/2008  . OTHER DISEASE OF PHARYNX OR NASOPHARYNX 07/03/2008  . PERIPHERAL EDEMA 01/06/2009  . POLYARTHRITIS 11/21/2010  . SHOULDER PAIN, BILATERAL 07/03/2008  . SINUSITIS- ACUTE-NOS 07/29/2009  . TACHYCARDIA 12/05/2007  . THYROID NODULE, RIGHT 11/20/2008  . TREMOR 01/02/2008  . Vertigo     Past Surgical History:  Procedure Laterality Date  . back surgury     lumbar 2001  . CESAREAN SECTION     x 3  . thyroid fine needle aspiration  May 2008   showed non neoplastic goiter  . VAGINAL HYSTERECTOMY     fibroids    Family History  Problem Relation Age of Onset  . Thyroid disease Mother   . Diabetes Mother   . Asthma Mother   . Hypertension Father   . Hyperlipidemia Father   . Diabetes Father   .  Emphysema Other   . Coronary artery disease Other   . Cancer Other        pancreatic and breast cancer  . Cancer Other        lung and prostate cancer  . Breast cancer Neg Hx     Social History   Socioeconomic History  . Marital status: Significant Other    Spouse name: Not on file  . Number of children: 3  . Years of education: Not on file  . Highest education level: Not on file  Occupational History    Employer: UNEMPLOYED  Tobacco Use  . Smoking status: Never Smoker  . Smokeless tobacco: Never Used  Substance and Sexual Activity  . Alcohol use: No  . Drug use: No  . Sexual activity: Not on file  Other Topics Concern  . Not on file  Social History Narrative  . Not on file   Social Determinants of Health   Financial Resource Strain:   . Difficulty of Paying Living Expenses:   Food Insecurity:   . Worried About Running Out of Food in the Last Year:   . Ran Out of Food in the Last Year:   Transportation Needs:   . Lack of Transportation (Medical):   . Lack of Transportation (Non-Medical):   Physical Activity:   . Days of Exercise per Week:   . Minutes of Exercise per Session:   Stress:   .   Feeling of Stress :   Social Connections:   . Frequency of Communication with Friends and Family:   . Frequency of Social Gatherings with Friends and Family:   . Attends Religious Services:   . Active Member of Clubs or Organizations:   . Attends Club or Organization Meetings:   . Marital Status:   Intimate Partner Violence:   . Fear of Current or Ex-Partner:   . Emotionally Abused:   . Physically Abused:   . Sexually Abused:      PHYSICAL EXAM:  VS: There were no vitals taken for this visit. Physical Exam Gen: NAD, alert, cooperative with exam, well-appearing MSK:  ***      ASSESSMENT & PLAN:   No problem-specific Assessment & Plan notes found for this encounter.     

## 2020-05-18 ENCOUNTER — Ambulatory Visit: Payer: BC Managed Care – PPO | Admitting: Family Medicine

## 2020-06-02 ENCOUNTER — Ambulatory Visit: Payer: BC Managed Care – PPO | Admitting: Family Medicine

## 2020-06-02 NOTE — Progress Notes (Deleted)
Shirley [name] - 44 y.o. female MRN [id number]  Date of birth: 08-09-1976  SUBJECTIVE:  Including CC & ROS.  No chief complaint on file.   Shirley [name] is a 44 y.o. female that is  ***.  ***   Review of Systems See HPI   HISTORY: Past Medical, Surgical, Social, and Family History Reviewed & Updated per EMR.   Pertinent Historical Findings include:  Past Medical History:  Diagnosis Date  . ADHD (attention deficit hyperactivity disorder)   . ALLERGIC RHINITIS 06/26/2008  . ANEMIA-IRON DEFICIENCY 06/26/2008  . 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  . ANKLE PAIN, LEFT 06/26/2008  . BACK PAIN, LUMBAR 10/15/2007  . BIPOLAR DISORDER UNSPECIFIED 09/13/2007  . CERVICAL RADICULOPATHY, LEFT 06/26/2008  . Cervicalgia 10/15/2007  . Chronic pain syndrome 03/21/2011  . COMMON MIGRAINE 06/26/2008  . DIABETES MELLITUS, TYPE II 06/05/2007  . DYSPNEA 12/05/2007  . EAR PAIN, RIGHT 12/05/2007  . ELEVATED BP READING WITHOUT DX HYPERTENSION 07/29/2009  . FATIGUE 12/05/2007  . FIBROMYALGIA 06/26/2008  . GASTROENTERITIS, ACUTE 12/15/2008  . GERD 06/26/2008  . Headache(784.0) 09/13/2007  . HYPERLIPIDEMIA 09/13/2007  . JOINT EFFUSION, RIGHT KNEE 07/29/2009  . KNEE PAIN, LEFT 11/21/2010  . LUMBAR RADICULOPATHY, LEFT 06/26/2008  . OTHER DISEASE OF PHARYNX OR NASOPHARYNX 07/03/2008  . PERIPHERAL EDEMA 01/06/2009  . POLYARTHRITIS 11/21/2010  . SHOULDER PAIN, BILATERAL 07/03/2008  . SINUSITIS- ACUTE-NOS 07/29/2009  . TACHYCARDIA 12/05/2007  . THYROID NODULE, RIGHT 11/20/2008  . TREMOR 01/02/2008  . Vertigo     Past Surgical History:  Procedure Laterality Date  . back surgury     lumbar 2001  . CESAREAN SECTION     x 3  . thyroid fine needle aspiration  May 2008   showed non neoplastic goiter  . VAGINAL HYSTERECTOMY     fibroids    Family History  Problem Relation Age of Onset  . Thyroid disease Mother   . Diabetes Mother   . Asthma Mother   . Hypertension Father   . Hyperlipidemia Father   . Diabetes Father   .  Emphysema Other   . Coronary artery disease Other   . Cancer Other        pancreatic and breast cancer  . Cancer Other        lung and prostate cancer  . Breast cancer Neg Hx     Social History   Socioeconomic History  . Marital status: Significant Other    Spouse name: Not on file  . Number of children: 3  . Years of education: Not on file  . Highest education level: Not on file  Occupational History    Employer: UNEMPLOYED  Tobacco Use  . Smoking status: Never Smoker  . Smokeless tobacco: Never Used  Substance and Sexual Activity  . Alcohol use: No  . Drug use: No  . Sexual activity: Not on file  Other Topics Concern  . Not on file  Social History Narrative  . Not on file   Social Determinants of Health   Financial Resource Strain:   . Difficulty of Paying Living Expenses:   Food Insecurity:   . Worried About Charity fundraiser in the Last Year:   . Arboriculturist in the Last Year:   Transportation Needs:   . Film/video editor (Medical):   Marland Kitchen Lack of Transportation (Non-Medical):   Physical Activity:   . Days of Exercise per Week:   . Minutes of Exercise per Session:   Stress:   .  Feeling of Stress :   Social Connections:   . Frequency of Communication with Friends and Family:   . Frequency of Social Gatherings with Friends and Family:   . Attends Religious Services:   . Active Member of Clubs or Organizations:   . Attends Archivist Meetings:   Marland Kitchen Marital Status:   Intimate Partner Violence:   . Fear of Current or Ex-Partner:   . Emotionally Abused:   Marland Kitchen Physically Abused:   . Sexually Abused:      PHYSICAL EXAM:  VS: There were no vitals taken for this visit. Physical Exam Gen: NAD, alert, cooperative with exam, well-appearing MSK:  ***      ASSESSMENT & PLAN:   No problem-specific Assessment & Plan notes found for this encounter.

## 2020-06-17 ENCOUNTER — Ambulatory Visit (INDEPENDENT_AMBULATORY_CARE_PROVIDER_SITE_OTHER): Payer: BC Managed Care – PPO | Admitting: Family Medicine

## 2020-06-17 ENCOUNTER — Encounter: Payer: Self-pay | Admitting: Family Medicine

## 2020-06-17 ENCOUNTER — Other Ambulatory Visit: Payer: Self-pay

## 2020-06-17 ENCOUNTER — Encounter (HOSPITAL_BASED_OUTPATIENT_CLINIC_OR_DEPARTMENT_OTHER): Payer: Self-pay | Admitting: Emergency Medicine

## 2020-06-17 ENCOUNTER — Emergency Department (HOSPITAL_BASED_OUTPATIENT_CLINIC_OR_DEPARTMENT_OTHER)
Admission: EM | Admit: 2020-06-17 | Discharge: 2020-06-17 | Disposition: A | Discharge status: Home / Self Care | Payer: BC Managed Care – PPO | Attending: Emergency Medicine | Admitting: Emergency Medicine

## 2020-06-17 VITALS — BP 150/89 | Ht 65.0 in | Wt 210.0 lb

## 2020-06-17 DIAGNOSIS — M545 Low back pain: Secondary | ICD-10-CM | POA: Diagnosis not present

## 2020-06-17 DIAGNOSIS — Z7984 Long term (current) use of oral hypoglycemic drugs: Secondary | ICD-10-CM | POA: Insufficient documentation

## 2020-06-17 DIAGNOSIS — G8929 Other chronic pain: Secondary | ICD-10-CM | POA: Diagnosis not present

## 2020-06-17 DIAGNOSIS — R208 Other disturbances of skin sensation: Secondary | ICD-10-CM | POA: Insufficient documentation

## 2020-06-17 DIAGNOSIS — M549 Dorsalgia, unspecified: Secondary | ICD-10-CM

## 2020-06-17 DIAGNOSIS — E119 Type 2 diabetes mellitus without complications: Secondary | ICD-10-CM

## 2020-06-17 DIAGNOSIS — M79604 Pain in right leg: Secondary | ICD-10-CM

## 2020-06-17 MED ORDER — OXYCODONE-ACETAMINOPHEN 5-325 MG PO TABS: 1.0000 | ORAL_TABLET | Freq: Four times a day (QID) | ORAL | 0 refills | Status: DC | PRN

## 2020-06-17 MED ORDER — GABAPENTIN 100 MG PO CAPS: 100.0000 mg | ORAL_CAPSULE | Freq: Three times a day (TID) | ORAL | 1 refills | Status: DC | PRN

## 2020-06-17 MED ORDER — KETOROLAC TROMETHAMINE 30 MG/ML IJ SOLN
30.0000 mg | Freq: Once | INTRAMUSCULAR | Status: AC
  Administered 2020-06-17: 30 mg via INTRAMUSCULAR

## 2020-06-17 NOTE — Assessment & Plan Note (Signed)
Does have a history of surgery from 2001.  She does have symptoms bilaterally at times.  Unclear if this is related to polyneuropathy versus a herniated disc. -IM Toradol. -Counseled supportive care. -Gabapentin. -X-ray. -MRI to evaluate for herniation and post procedural changes.

## 2020-06-17 NOTE — Progress Notes (Signed)
Shirley [name] - 44 y.o. female MRN [id number]  Date of birth: May 28, 1976  SUBJECTIVE:  Including CC & ROS.  Chief Complaint  Patient presents with  . Back Pain    bilateral low back    Shirley [name] is a 44 y.o. female that is presenting with acute worsening of low back pain with radicular type symptoms.  Also presenting with headache and altered sensation in her hands at intermittent times.  Denies any injury or inciting event.  She does have a history of lumbar surgery from 2001.  Does get relief intermittently with gabapentin.   Review of Systems See HPI   HISTORY: Past Medical, Surgical, Social, and Family History Reviewed & Updated per EMR.   Pertinent Historical Findings include:  Past Medical History:  Diagnosis Date  . ADHD (attention deficit hyperactivity disorder)   . ALLERGIC RHINITIS 06/26/2008  . ANEMIA-IRON DEFICIENCY 06/26/2008  . 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  . ANKLE PAIN, LEFT 06/26/2008  . BACK PAIN, LUMBAR 10/15/2007  . BIPOLAR DISORDER UNSPECIFIED 09/13/2007  . CERVICAL RADICULOPATHY, LEFT 06/26/2008  . Cervicalgia 10/15/2007  . Chronic pain syndrome 03/21/2011  . COMMON MIGRAINE 06/26/2008  . DIABETES MELLITUS, TYPE II 06/05/2007  . DYSPNEA 12/05/2007  . EAR PAIN, RIGHT 12/05/2007  . ELEVATED BP READING WITHOUT DX HYPERTENSION 07/29/2009  . FATIGUE 12/05/2007  . FIBROMYALGIA 06/26/2008  . GASTROENTERITIS, ACUTE 12/15/2008  . GERD 06/26/2008  . Headache(784.0) 09/13/2007  . HYPERLIPIDEMIA 09/13/2007  . JOINT EFFUSION, RIGHT KNEE 07/29/2009  . KNEE PAIN, LEFT 11/21/2010  . LUMBAR RADICULOPATHY, LEFT 06/26/2008  . OTHER DISEASE OF PHARYNX OR NASOPHARYNX 07/03/2008  . PERIPHERAL EDEMA 01/06/2009  . POLYARTHRITIS 11/21/2010  . SHOULDER PAIN, BILATERAL 07/03/2008  . SINUSITIS- ACUTE-NOS 07/29/2009  . TACHYCARDIA 12/05/2007  . THYROID NODULE, RIGHT 11/20/2008  . TREMOR 01/02/2008  . Vertigo     Past Surgical History:  Procedure Laterality Date  . back surgury     lumbar 2001    . CESAREAN SECTION     x 3  . thyroid fine needle aspiration  May 2008   showed non neoplastic goiter  . VAGINAL HYSTERECTOMY     fibroids    Family History  Problem Relation Age of Onset  . Thyroid disease Mother   . Diabetes Mother   . Asthma Mother   . Hypertension Father   . Hyperlipidemia Father   . Diabetes Father   . Emphysema Other   . Coronary artery disease Other   . Cancer Other        pancreatic and breast cancer  . Cancer Other        lung and prostate cancer  . Breast cancer Neg Hx     Social History   Socioeconomic History  . Marital status: Significant Other    Spouse name: Not on file  . Number of children: 3  . Years of education: Not on file  . Highest education level: Not on file  Occupational History    Employer: UNEMPLOYED  Tobacco Use  . Smoking status: Never Smoker  . Smokeless tobacco: Never Used  Substance and Sexual Activity  . Alcohol use: No  . Drug use: No  . Sexual activity: Not on file  Other Topics Concern  . Not on file  Social History Narrative  . Not on file   Social Determinants of Health   Financial Resource Strain:   . Difficulty of Paying Living Expenses: Not on file  Food Insecurity:   . Worried About  Running Out of Food in the Last Year: Not on file  . Ran Out of Food in the Last Year: Not on file  Transportation Needs:   . Lack of Transportation (Medical): Not on file  . Lack of Transportation (Non-Medical): Not on file  Physical Activity:   . Days of Exercise per Week: Not on file  . Minutes of Exercise per Session: Not on file  Stress:   . Feeling of Stress : Not on file  Social Connections:   . Frequency of Communication with Friends and Family: Not on file  . Frequency of Social Gatherings with Friends and Family: Not on file  . Attends Religious Services: Not on file  . Active Member of Clubs or Organizations: Not on file  . Attends Archivist Meetings: Not on file  . Marital Status: Not on  file  Intimate Partner Violence:   . Fear of Current or Ex-Partner: Not on file  . Emotionally Abused: Not on file  . Physically Abused: Not on file  . Sexually Abused: Not on file     PHYSICAL EXAM:  VS: BP (!) 150/89   Ht 5\' 5"  (1.651 m)   Wt 210 lb (95.3 kg)   BMI 34.95 kg/m  Physical Exam Gen: NAD, alert, cooperative with exam, well-appearing MSK:  Back: Normal flexion and extension. Negative straight leg raise. Neurovascularly intact     ASSESSMENT & PLAN:   Low back pain radiating to right leg Does have a history of surgery from 2001.  She does have symptoms bilaterally at times.  Unclear if this is related to polyneuropathy versus a herniated disc. -IM Toradol. -Counseled supportive care. -Gabapentin. -X-ray. -MRI to evaluate for herniation and post procedural changes.  Type 2 diabetes mellitus without complication, without long-term current use of insulin (HCC) Uncontrolled.  Lipase was elevated which could be related to elevated triglycerides. -Counseled on diet nutrition. -Could consider getting fasted lipid panel at some point.

## 2020-06-17 NOTE — Assessment & Plan Note (Addendum)
Uncontrolled.  Lipase was elevated which could be related to elevated triglycerides. -Counseled on diet nutrition. -Could consider getting fasted lipid panel at some point.

## 2020-06-17 NOTE — ED Provider Notes (Signed)
[facility] Provider Note: Georgena Spurling, MD, FACEP  CSN: [id number] MRN: [id number] ARRIVAL: 06/17/20 at [place]: [place]  Back Pain   HISTORY OF PRESENT ILLNESS  06/17/20 5:51 AM Shirley [name] is a 44 y.o. female with fibromyalgia and chronic neck and back pain.  She has been seeing Dr. Clearance Coots for management of her pains.  Specifically she has neck pain which often causes headaches as well as some grip weakness in the left hand; she states this is been present for several months.  She also has thoracic and lumbar pain with sciatica.  This pain radiates to her legs bilaterally and causes paresthesias of the feet.  She is here with an exacerbation for the last 2 days.  The pain begins in her mid thoracic back and radiates down to her tailbone and then to her legs bilaterally.  She feels it notably in the right anterior thigh.  She describes it as aching, sharp and shooting.  She rates it as an 8 out of 10 and it is worse with movement.  She is also having paresthesias and allodynia of the lower legs, prominently in the ankles and feet.  She is not having any new weakness and is still able to walk.  She denies any change in bowel or bladder function.  She has had plain films of her spine but no MRI.  She has a history of back surgery about 20 years ago.   Past Medical History:  Diagnosis Date  . ADHD (attention deficit hyperactivity disorder)   . ALLERGIC RHINITIS 06/26/2008  . ANEMIA-IRON DEFICIENCY 06/26/2008  . 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  . ANKLE PAIN, LEFT 06/26/2008  . BACK PAIN, LUMBAR 10/15/2007  . BIPOLAR DISORDER UNSPECIFIED 09/13/2007  . CERVICAL RADICULOPATHY, LEFT 06/26/2008  . Cervicalgia 10/15/2007  . Chronic pain syndrome 03/21/2011  . COMMON MIGRAINE 06/26/2008  . DIABETES MELLITUS, TYPE II 06/05/2007  . DYSPNEA 12/05/2007  . EAR PAIN, RIGHT 12/05/2007  . ELEVATED BP READING WITHOUT DX HYPERTENSION 07/29/2009  . FATIGUE 12/05/2007  .  FIBROMYALGIA 06/26/2008  . GASTROENTERITIS, ACUTE 12/15/2008  . GERD 06/26/2008  . Headache(784.0) 09/13/2007  . HYPERLIPIDEMIA 09/13/2007  . JOINT EFFUSION, RIGHT KNEE 07/29/2009  . KNEE PAIN, LEFT 11/21/2010  . LUMBAR RADICULOPATHY, LEFT 06/26/2008  . OTHER DISEASE OF PHARYNX OR NASOPHARYNX 07/03/2008  . PERIPHERAL EDEMA 01/06/2009  . POLYARTHRITIS 11/21/2010  . SHOULDER PAIN, BILATERAL 07/03/2008  . SINUSITIS- ACUTE-NOS 07/29/2009  . TACHYCARDIA 12/05/2007  . THYROID NODULE, RIGHT 11/20/2008  . TREMOR 01/02/2008  . Vertigo     Past Surgical History:  Procedure Laterality Date  . back surgury     lumbar 2001  . CESAREAN SECTION     x 3  . thyroid fine needle aspiration  May 2008   showed non neoplastic goiter  . VAGINAL HYSTERECTOMY     fibroids    Family History  Problem Relation Age of Onset  . Thyroid disease Mother   . Diabetes Mother   . Asthma Mother   . Hypertension Father   . Hyperlipidemia Father   . Diabetes Father   . Emphysema Other   . Coronary artery disease Other   . Cancer Other        pancreatic and breast cancer  . Cancer Other        lung and prostate cancer  . Breast cancer Neg Hx     Social History   Tobacco Use  . Smoking status:  Never Smoker  . Smokeless tobacco: Never Used  Substance Use Topics  . Alcohol use: No  . Drug use: No    Prior to Admission medications   Medication Sig Start Date End Date Taking? Authorizing Provider  estradiol (ESTRACE) 0.5 MG tablet Take 1 tablet (0.5 mg total) by mouth daily. 02/03/19   Truett Mainland, DO  gabapentin (NEURONTIN) 100 MG capsule Take 1 capsule (100 mg total) by mouth 3 (three) times daily as needed. 03/31/20   Rosemarie Ax, MD  metFORMIN (GLUCOPHAGE) 500 MG tablet Take 1 tablet (500 mg total) by mouth 2 (two) times daily with a meal. 01/11/18   Truett Mainland, DO  progesterone (PROMETRIUM) 200 MG capsule Take 1 capsule (200 mg total) by mouth daily. 01/30/19   Truett Mainland, DO     Allergies Sulfa antibiotics, Contrast media [iodinated diagnostic agents], Promethazine hcl, and Reglan [metoclopramide]   REVIEW OF SYSTEMS  Negative except as noted here or in the History of Present Illness.   PHYSICAL EXAMINATION  Initial Vital Signs Blood pressure (!) 149/91, pulse 85, temperature 98.4 F (36.9 C), temperature source Oral, resp. rate 18, height 5\' 5"  (1.651 m), weight 95.3 kg, SpO2 98 %.  Examination General: Well-developed, well-nourished female in no acute distress; appearance consistent with age of record HENT: normocephalic; atraumatic Eyes: pupils equal, round and reactive to light; extraocular muscles intact Neck: supple Heart: regular rate and rhythm Lungs: clear to auscultation bilaterally Abdomen: soft; nondistended; nontender; bowel sounds present Back: Tenderness of thoracic and lumbar paraspinal soft tissues; tenderness of lower lumbar spine and sacrum Extremities: No deformity; full range of motion; pulses normal Neurologic: Awake, alert and oriented; motor function intact in all extremities and symmetric but weaker in the lower extremities than in the upper extremities; sensation intact and symmetric in the lower extremities but with allodynia of the ankles and feet; no facial droop Skin: Warm and dry Psychiatric: Normal mood and affect   RESULTS  Summary of this visit's results, reviewed and interpreted by myself:   EKG Interpretation  Date/Time:    Ventricular Rate:    PR Interval:    QRS Duration:   QT Interval:    QTC Calculation:   R Axis:     Text Interpretation:        Laboratory Studies: No results found for this or any previous visit (from the past 24 hour(s)). Imaging Studies: No results found.  ED COURSE and MDM  Nursing notes, initial and subsequent vitals signs, including pulse oximetry, reviewed and interpreted by myself.  Vitals:   06/17/20 0546 06/17/20 0548  BP:  (!) 149/91  Pulse:  85  Resp:  18  Temp:   98.4 F (36.9 C)  TempSrc:  Oral  SpO2:  98%  Weight: 95.3 kg   Height: 5\' 5"  (1.651 m)    Medications - No data to display  This appears to be an acute exacerbation of her chronic pain.  She states the pattern and nature of the pain is similar only to the severity has changed.  She usually takes gabapentin with relief but it is not presently adequate.  She does not get relief with steroids.  She has been prescribed a narcotic since 2019.  I feel it is reasonable to treat her with a short course of narcotics and will contact Dr. Raeford Razor about close follow-up and consideration of MRIs to evaluate possible CNS or radicular contributors to her pain.  PROCEDURES  Procedures   ED  DIAGNOSES     ICD-10-CM   1. Exacerbation of chronic back pain  M54.9    G89.29   2. Allodynia  R20.8        Shanon Rosser, MD 06/17/20 [phone]

## 2020-06-17 NOTE — ED Triage Notes (Signed)
Pt is c/o pain in the middle of her back that goes down into her sacral area and down both legs  Pt states she has had back problems for a long time but worse this morning  Pt has had back xrays that she was told were normal  Pt states last night she started having tingling in her left foot  Pt states if she sits on the toilet her legs get numb  Pt also states with this she has been getting headaches and weakness in her arms so she is unable to open jars

## 2020-06-17 NOTE — Patient Instructions (Signed)
Good to see you Please try checking your blood sugar  I will call with the xray results.   Please send me a message in MyChart with any questions or updates.  We will schedule a virtual visit once the MRI is resulted.   --Dr. Raeford Razor

## 2020-07-07 ENCOUNTER — Other Ambulatory Visit: Payer: BC Managed Care – PPO

## 2020-07-19 ENCOUNTER — Emergency Department (HOSPITAL_BASED_OUTPATIENT_CLINIC_OR_DEPARTMENT_OTHER): Payer: BC Managed Care – PPO

## 2020-07-19 ENCOUNTER — Encounter (HOSPITAL_BASED_OUTPATIENT_CLINIC_OR_DEPARTMENT_OTHER): Payer: Self-pay | Admitting: *Deleted

## 2020-07-19 ENCOUNTER — Other Ambulatory Visit: Payer: Self-pay

## 2020-07-19 ENCOUNTER — Encounter: Payer: Self-pay | Admitting: Cardiology

## 2020-07-19 ENCOUNTER — Emergency Department (HOSPITAL_BASED_OUTPATIENT_CLINIC_OR_DEPARTMENT_OTHER)
Admission: EM | Admit: 2020-07-19 | Discharge: 2020-07-19 | Disposition: A | Discharge status: Home / Self Care | Payer: BC Managed Care – PPO | Attending: Emergency Medicine | Admitting: Emergency Medicine

## 2020-07-19 ENCOUNTER — Ambulatory Visit (INDEPENDENT_AMBULATORY_CARE_PROVIDER_SITE_OTHER): Payer: BC Managed Care – PPO | Admitting: Cardiology

## 2020-07-19 VITALS — BP 134/88 | HR 102 | Ht 65.0 in | Wt 200.0 lb

## 2020-07-19 DIAGNOSIS — Z7984 Long term (current) use of oral hypoglycemic drugs: Secondary | ICD-10-CM | POA: Diagnosis not present

## 2020-07-19 DIAGNOSIS — R42 Dizziness and giddiness: Secondary | ICD-10-CM | POA: Insufficient documentation

## 2020-07-19 DIAGNOSIS — R030 Elevated blood-pressure reading, without diagnosis of hypertension: Secondary | ICD-10-CM | POA: Diagnosis not present

## 2020-07-19 DIAGNOSIS — E871 Hypo-osmolality and hyponatremia: Secondary | ICD-10-CM

## 2020-07-19 DIAGNOSIS — R079 Chest pain, unspecified: Secondary | ICD-10-CM

## 2020-07-19 DIAGNOSIS — R0789 Other chest pain: Secondary | ICD-10-CM | POA: Insufficient documentation

## 2020-07-19 DIAGNOSIS — R002 Palpitations: Secondary | ICD-10-CM | POA: Diagnosis not present

## 2020-07-19 DIAGNOSIS — R000 Tachycardia, unspecified: Secondary | ICD-10-CM

## 2020-07-19 DIAGNOSIS — E119 Type 2 diabetes mellitus without complications: Secondary | ICD-10-CM

## 2020-07-19 DIAGNOSIS — R739 Hyperglycemia, unspecified: Secondary | ICD-10-CM

## 2020-07-19 DIAGNOSIS — I4510 Unspecified right bundle-branch block: Secondary | ICD-10-CM | POA: Diagnosis not present

## 2020-07-19 DIAGNOSIS — E1165 Type 2 diabetes mellitus with hyperglycemia: Secondary | ICD-10-CM | POA: Insufficient documentation

## 2020-07-19 DIAGNOSIS — R0602 Shortness of breath: Secondary | ICD-10-CM | POA: Insufficient documentation

## 2020-07-19 LAB — CBC WITH DIFFERENTIAL/PLATELET
Abs Immature Granulocytes: 0.02 10*3/uL (ref 0.00–0.07)
Basophils Absolute: 0.1 10*3/uL (ref 0.0–0.1)
Basophils Relative: 1 %
Eosinophils Absolute: 0.2 10*3/uL (ref 0.0–0.5)
Eosinophils Relative: 2 %
HCT: 43.9 % (ref 36.0–46.0)
Hemoglobin: 14.5 g/dL (ref 12.0–15.0)
Immature Granulocytes: 0 %
Lymphocytes Relative: 25 %
Lymphs Abs: 2 10*3/uL (ref 0.7–4.0)
MCH: 27.4 pg (ref 26.0–34.0)
MCHC: 33 g/dL (ref 30.0–36.0)
MCV: 82.8 fL (ref 80.0–100.0)
Monocytes Absolute: 0.8 10*3/uL (ref 0.1–1.0)
Monocytes Relative: 10 %
Neutro Abs: 5 10*3/uL (ref 1.7–7.7)
Neutrophils Relative %: 62 %
Platelets: 353 10*3/uL (ref 150–400)
RBC: 5.3 MIL/uL — ABNORMAL HIGH (ref 3.87–5.11)
RDW: 12.8 % (ref 11.5–15.5)
WBC: 8.1 10*3/uL (ref 4.0–10.5)
nRBC: 0 % (ref 0.0–0.2)

## 2020-07-19 LAB — BASIC METABOLIC PANEL
Anion gap: 11 (ref 5–15)
BUN: 15 mg/dL (ref 6–20)
CO2: 22 mmol/L (ref 22–32)
Calcium: 9.4 mg/dL (ref 8.9–10.3)
Chloride: 94 mmol/L — ABNORMAL LOW (ref 98–111)
Creatinine, Ser: 0.92 mg/dL (ref 0.44–1.00)
GFR calc Af Amer: >60 mL/min (ref >60)
GFR calc non Af Amer: >60 mL/min (ref >60)
Glucose, Bld: 446 mg/dL — ABNORMAL HIGH (ref 70–99)
Potassium: 3.8 mmol/L (ref 3.5–5.1)
Sodium: 127 mmol/L — ABNORMAL LOW (ref 135–145)

## 2020-07-19 LAB — TROPONIN I (HIGH SENSITIVITY)
Troponin I (High Sensitivity): 3 ng/L (ref <18)
Troponin I (High Sensitivity): 3 ng/L (ref <18)

## 2020-07-19 LAB — D-DIMER, QUANTITATIVE: D-Dimer, Quant: 0.51 ug/mL-FEU — ABNORMAL HIGH (ref 0.00–0.50)

## 2020-07-19 LAB — TSH: TSH: 2.611 u[IU]/mL (ref 0.350–4.500)

## 2020-07-19 LAB — CBG MONITORING, ED: Glucose-Capillary: 398 mg/dL — ABNORMAL HIGH (ref 70–99)

## 2020-07-19 MED ORDER — SODIUM CHLORIDE 0.9 % IV BOLUS
500.0000 mL | Freq: Once | INTRAVENOUS | Status: AC
  Administered 2020-07-19: 500 mL via INTRAVENOUS

## 2020-07-19 MED ORDER — METOPROLOL TARTRATE 50 MG PO TABS: 50.0000 mg | ORAL_TABLET | Freq: Two times a day (BID) | ORAL | 0 refills | Status: DC

## 2020-07-19 MED ORDER — DIPHENHYDRAMINE HCL 50 MG/ML IJ SOLN
25.0000 mg | Freq: Once | INTRAMUSCULAR | Status: AC
  Administered 2020-07-19: 25 mg via INTRAVENOUS
  Filled 2020-07-19: qty 1

## 2020-07-19 MED ORDER — LORAZEPAM 2 MG/ML IJ SOLN
1.0000 mg | Freq: Once | INTRAMUSCULAR | Status: AC
  Administered 2020-07-19: 1 mg via INTRAVENOUS
  Filled 2020-07-19: qty 1

## 2020-07-19 MED ORDER — SODIUM CHLORIDE 0.9 % IV BOLUS
1000.0000 mL | Freq: Once | INTRAVENOUS | Status: AC
  Administered 2020-07-19: 1000 mL via INTRAVENOUS

## 2020-07-19 MED ORDER — SITAGLIPTIN PHOSPHATE 50 MG PO TABS: 50.0000 mg | ORAL_TABLET | Freq: Every day | ORAL | 0 refills | Status: DC

## 2020-07-19 MED ORDER — IOHEXOL 350 MG/ML SOLN
100.0000 mL | Freq: Once | INTRAVENOUS | Status: AC | PRN
  Administered 2020-07-19: 100 mL via INTRAVENOUS

## 2020-07-19 MED ORDER — SITAGLIPTIN PHOSPHATE 50 MG PO TABS
50.0000 mg | ORAL_TABLET | Freq: Every day | ORAL | 4 refills | Status: DC
Start: 2020-07-19 — End: 2021-01-03

## 2020-07-19 NOTE — Patient Instructions (Signed)
Medication Instructions:  Your physician has recommended you make the following change in your medication:  START: Synjardy 12.5/1000mg  take one tablet by mouth daily.  *If you need a refill on your cardiac medications before your next appointment, please call your pharmacy*   Lab Work: None If you have labs (blood work) drawn today and your tests are completely normal, you will receive your results only by: Marland Kitchen MyChart Message (if you have MyChart) OR . A paper copy in the mail If you have any lab test that is abnormal or we need to change your treatment, we will call you to review the results.   Testing/Procedures: Your physician has requested that you have an echocardiogram. Echocardiography is a painless test that uses sound waves to create images of your heart. It provides your doctor with information about the size and shape of your heart and how well your heart's chambers and valves are working. This procedure takes approximately one hour. There are no restrictions for this procedure.     Follow-Up: At [facility], you and your health needs are our priority.  As part of our continuing mission to provide you with exceptional heart care, we have created designated Provider Care Teams.  These Care Teams include your primary Cardiologist (physician) and Advanced Practice Providers (APPs -  Physician Assistants and Nurse Practitioners) who all work together to provide you with the care you need, when you need it.  We recommend signing up for the patient portal called "MyChart".  Sign up information is provided on this After Visit Summary.  MyChart is used to connect with patients for Virtual Visits (Telemedicine).  Patients are able to view lab/test results, encounter notes, upcoming appointments, etc.  Non-urgent messages can be sent to your provider as well.   To learn more about what you can do with MyChart, go to [email or web address].    Your next appointment:    6 week(s)  The  format for your next appointment:   In Person  Provider:   Shirlee More, MD   Other Instructions

## 2020-07-19 NOTE — ED Provider Notes (Signed)
Blood pressure (!) 145/106, pulse (!) 105, temperature 98.3 F (36.8 C), temperature source Oral, resp. rate 16, height 5\' 5"  (1.651 m), weight 90.7 kg, SpO2 95 %.  Assuming care from Dr. Dina Rich.  In short, Shirley [name] is a 44 y.o. female with a chief complaint of sob .  Refer to the original H&P for additional details.  The current plan of care is to f/u on TSH.  11:10 AM  TSH is normal.  Patient's heart rate remains elevated but improved overall from arrival.  I do plan to start metoprolol 50 mg twice daily which was called into her pharmacy.  Her hyponatremia is likely pseudohyponatremia from her hyperglycemia.  She is not in DKA.  She is eating and drinking here without difficulty or pain.  She does not like taking the Metformin because it causes nausea and vomiting.  She plans to call her PCP today to talk about alternative diabetes medications.  I have also placed an ambulatory referral to cardiology with elevated heart rate, palpitations to assist with further rate control and possible echo if warranted.  Discussed ED return precautions in detail.  Patient is comfortable with the plan at discharge.    Margette Fast, MD 07/19/20 1119

## 2020-07-19 NOTE — ED Notes (Signed)
Pt to CT; pt verbalized several times that she is NOT allergic to CT contrast, she confirmed she was allergic to MRI contrast and that she had dry heaves and itching when given it; this RN confirmed orders with EDP

## 2020-07-19 NOTE — Progress Notes (Signed)
Cardiology Office Note:    Date:  07/19/2020   ID:  Shirley [name], DOB [birth year], MRN [id number]  PCP:  Eston Esters, NP  Cardiologist:  Shirlee More, MD   Referring MD: Margette Fast, MD  ASSESSMENT:    1. Right bundle branch block   2. Palpitations   3. Sinus tachycardia   4. Elevated blood pressure reading   5. Type 2 diabetes mellitus without complication, without long-term current use of insulin (HCC)    PLAN:    In order of problems listed above:  1. I think the central problem here is diabetes out-of-control she has no treatment has no PCP and I am going to give her samples of Synjardy to initiate and asked her to make an appointment as soon as possible to come under medical care. 2. Palpitation and sinus tachycardia related to diabetes out-of-control she has prescribed Lopressor in the ED which I think is a good choice patient elevated blood pressure reading 3. With right bundle branch block and complaints of shortness of breath prior to the ED echocardiogram ordered  Next appointment 6 weeks   Medication Adjustments/Labs and Tests Ordered: Current medicines are reviewed at length with the patient today.  Concerns regarding medicines are outlined above.  No orders of the defined types were placed in this encounter.  No orders of the defined types were placed in this encounter.    Chief Complaint  Patient presents with  . Follow-up    ED visit    History of Present Illness:    Shirley [name] is a 44 y.o. female who is being seen today for the evaluation of abnormal EKG and sinus tachycardia at the request of Long, Wonda Olds, MD. Is seen in the emergency room today with complaints of shortness of breath found to be tachycardic hypertensive uncontrolled diabetes and hyponatremia.  She is recommended from the ED to be evaluated with an echocardiogram Her evaluation included CT of the chest which showed no findings of pulmonary embolism cardiovascular  contours were normal there was no coronary calcification lungs were clear personally reviewed sinus tachycardia 109 bpm right bundle branch block.  Sensitivity troponins were normal.  And CBC were normal.  Background history of prediabetes treated with Metformin but cannot tolerate the generic form.  She has not felt well for a few weeks she has had polyuria polydipsia polyphagia felt increasingly weak nausea some visual changes aware of her heart beating blood pressure at home was elevated 140s over 100 heart rate was up to 122 bpm and decided to come to the ED because of sinus tachycardia right bundle branch block.  Not having chest pain and no specific shortness of breath edema but aware of her heart beating feels very weak and in general does not feel well.  She has not initiated on diabetic treatment we have samples of Synjardy 12.51 thousand that we will give her for 2 weeks to start medical therapy strongly encouraged her to make an appointment to see a primary care physician soon as possible.  Her serum sodium is 127 attributed to pseudohyponatremia and blood sugar 446 in the emergency room.  No known history of heart disease Past Medical History:  Diagnosis Date  . ADHD (attention deficit hyperactivity disorder)   . ALLERGIC RHINITIS 06/26/2008  . ANEMIA-IRON DEFICIENCY 06/26/2008  . 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  . ANKLE PAIN, LEFT 06/26/2008  . BACK PAIN, LUMBAR 10/15/2007  . BIPOLAR DISORDER UNSPECIFIED 09/13/2007  . CERVICAL RADICULOPATHY, LEFT 06/26/2008  .  Cervicalgia 10/15/2007  . Chronic pain syndrome 03/21/2011  . COMMON MIGRAINE 06/26/2008  . DIABETES MELLITUS, TYPE II 06/05/2007  . DYSPNEA 12/05/2007  . EAR PAIN, RIGHT 12/05/2007  . ELEVATED BP READING WITHOUT DX HYPERTENSION 07/29/2009  . FATIGUE 12/05/2007  . FIBROMYALGIA 06/26/2008  . GASTROENTERITIS, ACUTE 12/15/2008  . GERD 06/26/2008  . Headache(784.0) 09/13/2007  . HYPERLIPIDEMIA 09/13/2007  . JOINT EFFUSION, RIGHT KNEE 07/29/2009  . KNEE  PAIN, LEFT 11/21/2010  . LUMBAR RADICULOPATHY, LEFT 06/26/2008  . OTHER DISEASE OF PHARYNX OR NASOPHARYNX 07/03/2008  . PERIPHERAL EDEMA 01/06/2009  . POLYARTHRITIS 11/21/2010  . SHOULDER PAIN, BILATERAL 07/03/2008  . SINUSITIS- ACUTE-NOS 07/29/2009  . TACHYCARDIA 12/05/2007  . THYROID NODULE, RIGHT 11/20/2008  . TREMOR 01/02/2008  . Vertigo     Past Surgical History:  Procedure Laterality Date  . back surgury     lumbar 2001  . CESAREAN SECTION     x 3  . thyroid fine needle aspiration  May 2008   showed non neoplastic goiter  . VAGINAL HYSTERECTOMY     fibroids    Current Medications: Current Meds  Medication Sig  . estradiol (ESTRACE) 0.5 MG tablet Take 1 tablet (0.5 mg total) by mouth daily.  Marland Kitchen gabapentin (NEURONTIN) 100 MG capsule Take 1 capsule (100 mg total) by mouth 3 (three) times daily as needed.  . metoprolol tartrate (LOPRESSOR) 50 MG tablet Take 1 tablet (50 mg total) by mouth 2 (two) times daily.  Marland Kitchen oxyCODONE-acetaminophen (PERCOCET/ROXICET) 5-325 MG tablet Take 1 tablet by mouth every 6 (six) hours as needed for severe pain.     Allergies:   Sulfa antibiotics, Gadolinium derivatives, Promethazine hcl, and Reglan [metoclopramide]   Social History   Socioeconomic History  . Marital status: Significant Other    Spouse name: Not on file  . Number of children: 3  . Years of education: Not on file  . Highest education level: Not on file  Occupational History    Employer: UNEMPLOYED  Tobacco Use  . Smoking status: Never Smoker  . Smokeless tobacco: Never Used  Substance and Sexual Activity  . Alcohol use: No  . Drug use: No  . Sexual activity: Not on file  Other Topics Concern  . Not on file  Social History Narrative  . Not on file   Social Determinants of Health   Financial Resource Strain:   . Difficulty of Paying Living Expenses: Not on file  Food Insecurity:   . Worried About Charity fundraiser in the Last Year: Not on file  . Ran Out of Food in the  Last Year: Not on file  Transportation Needs:   . Lack of Transportation (Medical): Not on file  . Lack of Transportation (Non-Medical): Not on file  Physical Activity:   . Days of Exercise per Week: Not on file  . Minutes of Exercise per Session: Not on file  Stress:   . Feeling of Stress : Not on file  Social Connections:   . Frequency of Communication with Friends and Family: Not on file  . Frequency of Social Gatherings with Friends and Family: Not on file  . Attends Religious Services: Not on file  . Active Member of Clubs or Organizations: Not on file  . Attends Archivist Meetings: Not on file  . Marital Status: Not on file     Family History: The patient's family history includes Asthma in her mother; Cancer in some other family members; Coronary artery disease in an  other family member; Diabetes in her father and mother; Emphysema in an other family member; Hyperlipidemia in her father; Hypertension in her father; Thyroid disease in her mother. There is no history of Breast cancer.  ROS:   ROS Please see the history of present illness.     All other systems reviewed and are negative.  EKGs/Labs/Other Studies Reviewed:    The following studies were reviewed today:   EKG:  EKG is  ordered today.  The ekg ordered today is personally reviewed and demonstrates sinus tachycardia 109 bpm right bundle branch block  Recent Labs: 01/07/2020: ALT 25 07/19/2020: BUN 15; Creatinine, Ser 0.92; Hemoglobin 14.5; Platelets 353; Potassium 3.8; Sodium 127; TSH 2.611  Recent Lipid Panel    Component Value Date/Time   CHOL 251 (H) 01/10/2018 1035   TRIG 130 01/10/2018 1035   HDL 51 01/10/2018 1035   CHOLHDL 4.9 (H) 01/10/2018 1035   CHOLHDL 5 05/26/2011 1238   VLDL 22.8 05/26/2011 1238   LDLCALC 174 (H) 01/10/2018 1035   LDLDIRECT 165.1 05/26/2011 1238    Physical Exam:    VS:  BP 134/88   Pulse (!) 102   Ht 5\' 5"  (1.651 m)   Wt 200 lb (90.7 kg)   SpO2 99%   BMI  33.28 kg/m     Wt Readings from Last 3 Encounters:  07/19/20 200 lb (90.7 kg)  07/19/20 200 lb (90.7 kg)  06/17/20 210 lb (95.3 kg)     GEN: She is alert looks fatigued well nourished, well developed in no acute distress HEENT: Normal NECK: No JVD; No carotid bruits LYMPHATICS: No lymphadenopathy CARDIAC: RRR, no murmurs, rubs, gallops RESPIRATORY:  Clear to auscultation without rales, wheezing or rhonchi  ABDOMEN: Soft, non-tender, non-distended MUSCULOSKELETAL:  No edema; No deformity  SKIN: Warm and dry NEUROLOGIC:  Alert and oriented x 3 PSYCHIATRIC:  Normal affect     Signed, Shirlee More, MD  07/19/2020 3:13 PM    [facility]

## 2020-07-19 NOTE — ED Triage Notes (Addendum)
Pt states her blood pressure has been high for the past several days. (does not take b/p meds)  C/o feeling light headed and sob. Symptoms since Wednesday. C/o chest pain when she was coming in here this morning. Describes as a heavy feeling. Pt c/o general fatigue. C/o nausea. Denies any vomiting. Pt has traveled 2 hours on Friday.

## 2020-07-19 NOTE — Discharge Instructions (Addendum)
You were seen in the emergency room today with elevated heart rate and feeling like your heart is racing.  Your blood sugar is high here likely from not taking the Metformin.  I would like for you to call your primary care doctor first thing today to have them talk with you about additional blood sugar medications you can take.  I am also sending you to see the cardiologist.  I have started a new medicine, metoprolol, to take as directed.  This should help decrease your heart rate slightly but may need to be adjusted.  The cardiologist can help with this.  Please call to confirm your appointment.   If you develop new or suddenly worsening symptoms please return to the emergency department immediately for evaluation.

## 2020-07-19 NOTE — ED Notes (Signed)
Attempted PIV twice unsuccessfully; pt tolerated well; requested another to insert

## 2020-07-19 NOTE — ED Provider Notes (Signed)
[facility] EMERGENCY DEPARTMENT Provider Note   CSN: [id number] Arrival date & time: 07/19/20  1638     History Chief Complaint  Patient presents with  . sob    Shirley [name] is a 44 y.o. female.  HPI     Thi s 44 year old female who presents with concerns for high blood pressure.  Patient reports that she has had increasing blood pressure since Wednesday.  She does not recall the readings.  No history of high blood pressures and does not take any medications.  She states she has had increased stress at work which is caused her to have the symptoms.  She feels like her heart is racing and she now feels lightheaded and short of breath.  Describes some heavy feeling in her chest that started this morning.  No shortness of breath, cough, fevers.  No known sick contacts or Covid exposures.  She does report history of thyroid goiter but is on no thyroid medication and no history of hyper or hypothyroidism.  Past Medical History:  Diagnosis Date  . ADHD (attention deficit hyperactivity disorder)   . ALLERGIC RHINITIS 06/26/2008  . ANEMIA-IRON DEFICIENCY 06/26/2008  . 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  . ANKLE PAIN, LEFT 06/26/2008  . BACK PAIN, LUMBAR 10/15/2007  . BIPOLAR DISORDER UNSPECIFIED 09/13/2007  . CERVICAL RADICULOPATHY, LEFT 06/26/2008  . Cervicalgia 10/15/2007  . Chronic pain syndrome 03/21/2011  . COMMON MIGRAINE 06/26/2008  . DIABETES MELLITUS, TYPE II 06/05/2007  . DYSPNEA 12/05/2007  . EAR PAIN, RIGHT 12/05/2007  . ELEVATED BP READING WITHOUT DX HYPERTENSION 07/29/2009  . FATIGUE 12/05/2007  . FIBROMYALGIA 06/26/2008  . GASTROENTERITIS, ACUTE 12/15/2008  . GERD 06/26/2008  . Headache(784.0) 09/13/2007  . HYPERLIPIDEMIA 09/13/2007  . JOINT EFFUSION, RIGHT KNEE 07/29/2009  . KNEE PAIN, LEFT 11/21/2010  . LUMBAR RADICULOPATHY, LEFT 06/26/2008  . OTHER DISEASE OF PHARYNX OR NASOPHARYNX 07/03/2008  . PERIPHERAL EDEMA 01/06/2009  . POLYARTHRITIS 11/21/2010  . SHOULDER PAIN, BILATERAL  07/03/2008  . SINUSITIS- ACUTE-NOS 07/29/2009  . TACHYCARDIA 12/05/2007  . THYROID NODULE, RIGHT 11/20/2008  . TREMOR 01/02/2008  . Vertigo     Patient Active Problem List   Diagnosis Date Noted  . Toe injury, left, initial encounter 05/22/2019  . Right foot injury, initial encounter 02/08/2018  . Bilateral knee pain 01/14/2018  . MRSA (methicillin resistant staph aureus) urine culture positive on 07/06/16 07/06/2016  . Thoracic back pain 12/20/2015  . Low back pain radiating to right leg 12/01/2015  . Shoulder impingement syndrome, left 11/07/2013  . Encounter for long-term (current) use of high-risk medication 05/29/2011  . Chronic pain syndrome 03/21/2011  . THYROID NODULE, RIGHT 11/20/2008  . GERD 06/26/2008  . CERVICAL RADICULOPATHY, LEFT 06/26/2008  . LUMBAR RADICULOPATHY, LEFT 06/26/2008  . Fibromyalgia 06/26/2008  . Neck pain 10/15/2007  . Hyperlipidemia 09/13/2007  . BIPOLAR DISORDER UNSPECIFIED 09/13/2007  . Type 2 diabetes mellitus without complication, without long-term current use of insulin ([place]) 06/05/2007    Past Surgical History:  Procedure Laterality Date  . back surgury     lumbar 2001  . CESAREAN SECTION     x 3  . thyroid fine needle aspiration  May 2008   showed non neoplastic goiter  . VAGINAL HYSTERECTOMY     fibroids     OB History    Gravida  3   Para  3   Term  2   Preterm  1   AB      Living  3  SAB      TAB      Ectopic      Multiple      Live Births  3           Family History  Problem Relation Age of Onset  . Thyroid disease Mother   . Diabetes Mother   . Asthma Mother   . Hypertension Father   . Hyperlipidemia Father   . Diabetes Father   . Emphysema Other   . Coronary artery disease Other   . Cancer Other        pancreatic and breast cancer  . Cancer Other        lung and prostate cancer  . Breast cancer Neg Hx     Social History   Tobacco Use  . Smoking status: Never Smoker  . Smokeless tobacco:  Never Used  Substance Use Topics  . Alcohol use: No  . Drug use: No    Home Medications Prior to Admission medications   Medication Sig Start Date End Date Taking? Authorizing Provider  estradiol (ESTRACE) 0.5 MG tablet Take 1 tablet (0.5 mg total) by mouth daily. 02/03/19   Truett Mainland, DO  gabapentin (NEURONTIN) 100 MG capsule Take 1 capsule (100 mg total) by mouth 3 (three) times daily as needed. 06/17/20   Rosemarie Ax, MD  metFORMIN (GLUCOPHAGE) 500 MG tablet Take 1 tablet (500 mg total) by mouth 2 (two) times daily with a meal. 01/11/18   Truett Mainland, DO  oxyCODONE-acetaminophen (PERCOCET/ROXICET) 5-325 MG tablet Take 1 tablet by mouth every 6 (six) hours as needed for severe pain. 06/17/20   Molpus, John, MD  progesterone (PROMETRIUM) 200 MG capsule Take 1 capsule (200 mg total) by mouth daily. 01/30/19   Truett Mainland, DO    Allergies    Sulfa antibiotics, Gadolinium derivatives, Promethazine hcl, and Reglan [metoclopramide]  Review of Systems   Review of Systems  Constitutional: Negative for fever.  Respiratory: Positive for chest tightness. Negative for cough.   Cardiovascular: Positive for palpitations. Negative for chest pain.  Gastrointestinal: Negative for abdominal pain, diarrhea, nausea and vomiting.  Neurological: Positive for light-headedness.  All other systems reviewed and are negative.   Physical Exam Updated Vital Signs BP (!) 145/106   Pulse (!) 105   Temp 98.3 F (36.8 C) (Oral)   Resp 16   Ht 1.651 m (5\' 5" )   Wt 90.7 kg   SpO2 95%   BMI 33.28 kg/m   Physical Exam Vitals and nursing note reviewed.  Constitutional:      Appearance: She is well-developed. She is not ill-appearing.  HENT:     Head: Normocephalic and atraumatic.     Mouth/Throat:     Mouth: Mucous membranes are moist.  Eyes:     Pupils: Pupils are equal, round, and reactive to light.  Cardiovascular:     Rate and Rhythm: Regular rhythm. Tachycardia present.      Heart sounds: Normal heart sounds.  Pulmonary:     Effort: Pulmonary effort is normal. No respiratory distress.     Breath sounds: No wheezing.  Abdominal:     General: Bowel sounds are normal.     Palpations: Abdomen is soft.     Tenderness: There is no abdominal tenderness.  Musculoskeletal:     Cervical back: Neck supple.     Right lower leg: No edema.     Left lower leg: No edema.  Skin:    General: Skin is  warm and dry.  Neurological:     Mental Status: She is alert and oriented to person, place, and time.  Psychiatric:        Mood and Affect: Mood normal.     ED Results / Procedures / Treatments   Labs (all labs ordered are listed, but only abnormal results are displayed) Labs Reviewed  CBC WITH DIFFERENTIAL/PLATELET - Abnormal; Notable for the following components:      Result Value   RBC 5.30 (*)    All other components within normal limits  D-DIMER, QUANTITATIVE (NOT AT [facility]) - Abnormal; Notable for the following components:   D-Dimer, Quant 0.51 (*)    All other components within normal limits  BASIC METABOLIC PANEL - Abnormal; Notable for the following components:   Sodium 127 (*)    Chloride 94 (*)    Glucose, Bld 446 (*)    All other components within normal limits  CBG MONITORING, ED - Abnormal; Notable for the following components:   Glucose-Capillary 398 (*)    All other components within normal limits  TSH  TROPONIN I (HIGH SENSITIVITY)  TROPONIN I (HIGH SENSITIVITY)    EKG EKG Interpretation  Date/Time:  Monday July 19 2020 03:28:58 EDT Ventricular Rate:  126 PR Interval:    QRS Duration: 143 QT Interval:  338 QTC Calculation: 490 R Axis:   106 Text Interpretation: Sinus tachycardia RBBB and LPFB Confirmed by Thayer Jew [phone]) on 07/19/2020 3:42:41 AM   Radiology CT Angio Chest PE W and/or Wo Contrast  Result Date: 07/19/2020 CLINICAL DATA:  Positive D-dimer. Pulmonary embolus suspected with low to intermediate probability.  High blood pressure for several days. Lightheaded and shortness of breath. Chest pain. EXAM: CT ANGIOGRAPHY CHEST WITH CONTRAST TECHNIQUE: Multidetector CT imaging of the chest was performed using the standard protocol during bolus administration of intravenous contrast. Multiplanar CT image reconstructions and MIPs were obtained to evaluate the vascular anatomy. CONTRAST:  162mL OMNIPAQUE IOHEXOL 350 MG/ML SOLN COMPARISON:  01/06/2009 FINDINGS: Cardiovascular: Good opacification of the central and segmental pulmonary arteries. No focal filling defects. No evidence of significant pulmonary embolus. Normal heart size. No pericardial effusions. Normal caliber thoracic aorta. No aortic dissection. Great vessel origins are patent. Mediastinum/Nodes: No significant lymphadenopathy. Esophagus is decompressed. Lungs/Pleura: Motion artifact limits evaluation. Lungs appear clear. No pleural effusions. No pneumothorax. Mediastinal contours appear intact. Upper Abdomen: No acute abnormalities demonstrated in the visualized upper abdomen. Musculoskeletal: No chest wall abnormality. No acute or significant osseous findings. Review of the MIP images confirms the above findings. IMPRESSION: 1. No evidence of significant pulmonary embolus. 2. No evidence of active pulmonary disease. Electronically Signed   By: Lucienne Capers M.D.   On: 07/19/2020 05:34   DG Chest Portable 1 View  Result Date: 07/19/2020 CLINICAL DATA:  High blood pressure for several days. Lightheaded and shortness of breath. EXAM: PORTABLE CHEST 1 VIEW COMPARISON:  03/05/2017 FINDINGS: Shallow inspiration. The heart size and mediastinal contours are within normal limits. Both lungs are clear. The visualized skeletal structures are unremarkable. IMPRESSION: No active disease. Electronically Signed   By: Lucienne Capers M.D.   On: 07/19/2020 04:08    Procedures Procedures (including critical care time)  Medications Ordered in ED Medications  sodium  chloride 0.9 % bolus 1,000 mL (0 mLs Intravenous Stopped 07/19/20 0517)  LORazepam (ATIVAN) injection 1 mg (1 mg Intravenous Given 07/19/20 0408)  diphenhydrAMINE (BENADRYL) injection 25 mg (25 mg Intravenous Given 07/19/20 0509)  iohexol (OMNIPAQUE) 350 MG/ML injection 100  mL (100 mLs Intravenous Contrast Given 07/19/20 0518)  sodium chloride 0.9 % bolus 500 mL (500 mLs Intravenous New Bag/Given 07/19/20 0620)    ED Course  I have reviewed the triage vital signs and the nursing notes.  Pertinent labs & imaging results that were available during my care of the patient were reviewed by me and considered in my medical decision making (see chart for details).  Clinical Course as of Jul 19 654  Mon Jul 19, 2020  0300 On recheck, patient remains mildly tachycardic.  CT is negative for PE.  Repeat blood sugar in the 300s.  She reports erratic blood sugars at home.  She has been given 200 and fluids.  She has mild hyponatremia.  Still awaiting thyroid testing.   [CH]    Clinical Course User Index [CH] Denielle Bayard, Barbette Hair, MD   MDM Rules/Calculators/A&P                          Patient presents with concerns for high blood pressure.  Reports associated chest tightness and shortness of breath.  She is overall nontoxic-appearing.  Reports increased stressors at work.  Notably hypertensive but nontoxic on of evaluation.  Considerations include but not limited to, ACS, PE, hypertensive urgency, thyroid disease given tachycardia, anxiety related to stress.  Work-up initiated.  EKG shows sinus tachycardia without acute ischemic or arrhythmic changes.  Chest x-ray without thorax or pneumonia.  Screening D-dimer was slightly elevated.  CTA of the chest obtained and shows no evidence of acute pneumothorax.  Thyroid testing is pending.  See clinical course above.  Patient was given fluids and Ativan for her symptoms and had some relief although she remained mildly tachycardic.  She is also hyperglycemic without  evidence of an anion gap or DKA.  Patient signed out pending thyroid testing to rule out thyroid storm.  Final Clinical Impression(s) / ED Diagnoses Final diagnoses:  Tachycardia  Chest pain, unspecified type  Hyperglycemia  Hyponatremia    Rx / DC Orders ED Discharge Orders    None       Merryl Hacker, MD 07/19/20 [phone]

## 2020-07-19 NOTE — ED Notes (Addendum)
Pt reported new chest pain under right breast; this RN noted monitor showed abnormal waveforms; re-checked EKG with EDP Horton and informed her of pt chest pain; EDP reviewed the new EKG and said it looked the same as her triage EKG and that the pt has a BBB and will be abnormal on an EKG

## 2020-07-19 NOTE — Progress Notes (Signed)
Per verbal order from Dr. Bettina Gavia patient is to not take the [facility] and is instead going to take Januvia 50 mg daily. I called the patient to let her know this and sent the prescription in for her. She verbalizes understanding and states that the soonest she was able to get in with her PCP was in December.

## 2020-07-19 NOTE — ED Notes (Signed)
ED Provider at bedside. 

## 2020-07-30 ENCOUNTER — Other Ambulatory Visit: Payer: Self-pay

## 2020-07-30 ENCOUNTER — Ambulatory Visit
Admission: RE | Admit: 2020-07-30 | Discharge: 2020-07-30 | Disposition: A | Discharge status: Home / Self Care | Payer: BC Managed Care – PPO | Source: Ambulatory Visit | Attending: Family Medicine | Admitting: Family Medicine

## 2020-07-30 DIAGNOSIS — M79604 Pain in right leg: Secondary | ICD-10-CM

## 2020-08-02 ENCOUNTER — Telehealth: Payer: Self-pay | Admitting: Family Medicine

## 2020-08-02 NOTE — Telephone Encounter (Signed)
Called pt to schedule MRI review appt --left msg for her to call office.  -glh

## 2020-08-09 ENCOUNTER — Other Ambulatory Visit: Payer: Self-pay

## 2020-08-09 ENCOUNTER — Ambulatory Visit (HOSPITAL_BASED_OUTPATIENT_CLINIC_OR_DEPARTMENT_OTHER)
Admission: RE | Admit: 2020-08-09 | Discharge: 2020-08-09 | Disposition: A | Discharge status: Home / Self Care | Payer: BC Managed Care – PPO | Source: Ambulatory Visit | Attending: Cardiology | Admitting: Cardiology

## 2020-08-09 DIAGNOSIS — R002 Palpitations: Secondary | ICD-10-CM | POA: Diagnosis not present

## 2020-08-09 DIAGNOSIS — I340 Nonrheumatic mitral (valve) insufficiency: Secondary | ICD-10-CM

## 2020-08-09 DIAGNOSIS — R000 Tachycardia, unspecified: Secondary | ICD-10-CM | POA: Insufficient documentation

## 2020-08-09 DIAGNOSIS — I361 Nonrheumatic tricuspid (valve) insufficiency: Secondary | ICD-10-CM | POA: Diagnosis not present

## 2020-08-09 LAB — ECHOCARDIOGRAM COMPLETE
Area-P 1/2: 3.93 cm2
S' Lateral: 2.6 cm

## 2020-08-10 ENCOUNTER — Encounter: Payer: Self-pay | Admitting: Family Medicine

## 2020-08-10 ENCOUNTER — Ambulatory Visit (INDEPENDENT_AMBULATORY_CARE_PROVIDER_SITE_OTHER): Payer: BC Managed Care – PPO | Admitting: Family Medicine

## 2020-08-10 DIAGNOSIS — M79604 Pain in right leg: Secondary | ICD-10-CM

## 2020-08-10 DIAGNOSIS — M545 Low back pain, unspecified: Secondary | ICD-10-CM

## 2020-08-10 DIAGNOSIS — M546 Pain in thoracic spine: Secondary | ICD-10-CM

## 2020-08-10 NOTE — Assessment & Plan Note (Signed)
Pain is still occurring in the thoracic region.  She feels her radiate in several different areas.  Concern for possible radicular component. -Counseled on home exercise therapy and supportive care. -MRI to evaluate for nerve impingement.

## 2020-08-10 NOTE — Progress Notes (Signed)
Shirley [name] - 44 y.o. female MRN [id number]  Date of birth: 05/25/1976  SUBJECTIVE:  Including CC & ROS.  Chief Complaint  Patient presents with  . Results    MRI  . Back Pain    low to upper     Shirley [name] is a 44 y.o. female that is following up after the MRI of her lumbar spine.  This was revealing for previous postsurgical changes.  She is also experiencing severe mid thoracic type pain.  She has radiation around to the front of her body from time to time.  She also has symptoms that radiate proximal and distal.  This is been acute on chronic in nature.   Review of Systems See HPI   HISTORY: Past Medical, Surgical, Social, and Family History Reviewed & Updated per EMR.   Pertinent Historical Findings include:  Past Medical History:  Diagnosis Date  . ADHD (attention deficit hyperactivity disorder)   . ALLERGIC RHINITIS 06/26/2008  . ANEMIA-IRON DEFICIENCY 06/26/2008  . 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  . ANKLE PAIN, LEFT 06/26/2008  . BACK PAIN, LUMBAR 10/15/2007  . BIPOLAR DISORDER UNSPECIFIED 09/13/2007  . CERVICAL RADICULOPATHY, LEFT 06/26/2008  . Cervicalgia 10/15/2007  . Chronic pain syndrome 03/21/2011  . COMMON MIGRAINE 06/26/2008  . DIABETES MELLITUS, TYPE II 06/05/2007  . DYSPNEA 12/05/2007  . EAR PAIN, RIGHT 12/05/2007  . ELEVATED BP READING WITHOUT DX HYPERTENSION 07/29/2009  . FATIGUE 12/05/2007  . FIBROMYALGIA 06/26/2008  . GASTROENTERITIS, ACUTE 12/15/2008  . GERD 06/26/2008  . Headache(784.0) 09/13/2007  . HYPERLIPIDEMIA 09/13/2007  . JOINT EFFUSION, RIGHT KNEE 07/29/2009  . KNEE PAIN, LEFT 11/21/2010  . LUMBAR RADICULOPATHY, LEFT 06/26/2008  . OTHER DISEASE OF PHARYNX OR NASOPHARYNX 07/03/2008  . PERIPHERAL EDEMA 01/06/2009  . POLYARTHRITIS 11/21/2010  . SHOULDER PAIN, BILATERAL 07/03/2008  . SINUSITIS- ACUTE-NOS 07/29/2009  . TACHYCARDIA 12/05/2007  . THYROID NODULE, RIGHT 11/20/2008  . TREMOR 01/02/2008  . Vertigo     Past Surgical History:  Procedure Laterality Date    . back surgury     lumbar 2001  . CESAREAN SECTION     x 3  . thyroid fine needle aspiration  May 2008   showed non neoplastic goiter  . VAGINAL HYSTERECTOMY     fibroids    Family History  Problem Relation Age of Onset  . Thyroid disease Mother   . Diabetes Mother   . Asthma Mother   . Hypertension Father   . Hyperlipidemia Father   . Diabetes Father   . Emphysema Other   . Coronary artery disease Other   . Cancer Other        pancreatic and breast cancer  . Cancer Other        lung and prostate cancer  . Breast cancer Neg Hx     Social History   Socioeconomic History  . Marital status: Significant Other    Spouse name: Not on file  . Number of children: 3  . Years of education: Not on file  . Highest education level: Not on file  Occupational History    Employer: UNEMPLOYED  Tobacco Use  . Smoking status: Never Smoker  . Smokeless tobacco: Never Used  Substance and Sexual Activity  . Alcohol use: No  . Drug use: No  . Sexual activity: Not on file  Other Topics Concern  . Not on file  Social History Narrative  . Not on file   Social Determinants of Health   Financial Resource Strain:   .  Difficulty of Paying Living Expenses: Not on file  Food Insecurity:   . Worried About Charity fundraiser in the Last Year: Not on file  . Ran Out of Food in the Last Year: Not on file  Transportation Needs:   . Lack of Transportation (Medical): Not on file  . Lack of Transportation (Non-Medical): Not on file  Physical Activity:   . Days of Exercise per Week: Not on file  . Minutes of Exercise per Session: Not on file  Stress:   . Feeling of Stress : Not on file  Social Connections:   . Frequency of Communication with Friends and Family: Not on file  . Frequency of Social Gatherings with Friends and Family: Not on file  . Attends Religious Services: Not on file  . Active Member of Clubs or Organizations: Not on file  . Attends Archivist Meetings: Not  on file  . Marital Status: Not on file  Intimate Partner Violence:   . Fear of Current or Ex-Partner: Not on file  . Emotionally Abused: Not on file  . Physically Abused: Not on file  . Sexually Abused: Not on file     PHYSICAL EXAM:  VS: BP 131/86   Ht 5\' 4"  (1.626 m)   Wt 200 lb (90.7 kg)   BMI 34.33 kg/m  Physical Exam Gen: NAD, alert, cooperative with exam, well-appearing MSK:  Back: Normal flexion and extension. Normal shoulder range of motion. Neurovascularly intact     ASSESSMENT & PLAN:   Thoracic back pain Pain is still occurring in the thoracic region.  She feels her radiate in several different areas.  Concern for possible radicular component. -Counseled on home exercise therapy and supportive care. -MRI to evaluate for nerve impingement.  Low back pain radiating to right leg Postsurgical changes appreciated on MRI.  She does have degenerative changes of the disc as well as facet joints.  Pain seems more oriented with thoracic at this time. -Counseled on home exercise therapy and supportive care. -Could consider epidural or facet injections or physical therapy.

## 2020-08-10 NOTE — Assessment & Plan Note (Signed)
Postsurgical changes appreciated on MRI.  She does have degenerative changes of the disc as well as facet joints.  Pain seems more oriented with thoracic at this time. -Counseled on home exercise therapy and supportive care. -Could consider epidural or facet injections or physical therapy.

## 2020-08-11 ENCOUNTER — Telehealth: Payer: Self-pay

## 2020-08-11 NOTE — Telephone Encounter (Signed)
PA started on CMM with both of her insurances.  Primary key: [id number] and Secondary Key: [id number].  On primary insurance CMM stated that the PA had been resolved so I called the pharmacy to confirm and the patient picked the Januvia up with a discount card for $3.00.

## 2020-08-27 ENCOUNTER — Telehealth: Payer: Self-pay

## 2020-08-27 NOTE — Telephone Encounter (Signed)
Per Dr. Bettina Gavia: Echo results  Good result No findings of heart failure with SOB  Spoke with patient regarding results and recommendation.  Patient verbalizes understanding and is agreeable to plan of care. Advised patient to call back with any issues or concerns.

## 2020-08-29 ENCOUNTER — Emergency Department (HOSPITAL_BASED_OUTPATIENT_CLINIC_OR_DEPARTMENT_OTHER): Payer: BC Managed Care – PPO

## 2020-08-29 ENCOUNTER — Emergency Department (HOSPITAL_BASED_OUTPATIENT_CLINIC_OR_DEPARTMENT_OTHER)
Admission: EM | Admit: 2020-08-29 | Discharge: 2020-08-29 | Disposition: A | Discharge status: Home / Self Care | Payer: BC Managed Care – PPO | Attending: Emergency Medicine | Admitting: Emergency Medicine

## 2020-08-29 ENCOUNTER — Inpatient Hospital Stay: Admission: RE | Admit: 2020-08-29 | Payer: BC Managed Care – PPO | Source: Ambulatory Visit

## 2020-08-29 ENCOUNTER — Encounter (HOSPITAL_BASED_OUTPATIENT_CLINIC_OR_DEPARTMENT_OTHER): Payer: Self-pay | Admitting: *Deleted

## 2020-08-29 ENCOUNTER — Other Ambulatory Visit: Payer: Self-pay

## 2020-08-29 DIAGNOSIS — Z20822 Contact with and (suspected) exposure to covid-19: Secondary | ICD-10-CM | POA: Diagnosis not present

## 2020-08-29 DIAGNOSIS — E119 Type 2 diabetes mellitus without complications: Secondary | ICD-10-CM | POA: Insufficient documentation

## 2020-08-29 DIAGNOSIS — R000 Tachycardia, unspecified: Secondary | ICD-10-CM | POA: Insufficient documentation

## 2020-08-29 DIAGNOSIS — J069 Acute upper respiratory infection, unspecified: Secondary | ICD-10-CM | POA: Diagnosis not present

## 2020-08-29 DIAGNOSIS — R059 Cough, unspecified: Secondary | ICD-10-CM | POA: Diagnosis present

## 2020-08-29 DIAGNOSIS — Z7984 Long term (current) use of oral hypoglycemic drugs: Secondary | ICD-10-CM | POA: Insufficient documentation

## 2020-08-29 LAB — RESPIRATORY PANEL BY RT PCR (FLU A&B, COVID)
Influenza A by PCR: NEGATIVE
Influenza B by PCR: NEGATIVE
SARS Coronavirus 2 by RT PCR: NEGATIVE

## 2020-08-29 MED ORDER — BENZONATATE 100 MG PO CAPS
100.0000 mg | ORAL_CAPSULE | Freq: Once | ORAL | Status: AC
  Administered 2020-08-29: 100 mg via ORAL
  Filled 2020-08-29: qty 1

## 2020-08-29 MED ORDER — BENZONATATE 100 MG PO CAPS: 100.0000 mg | ORAL_CAPSULE | Freq: Three times a day (TID) | ORAL | 0 refills | Status: DC | PRN

## 2020-08-29 NOTE — ED Provider Notes (Signed)
[facility] Provider Note   CSN: [id number] Arrival date & time: 08/29/20  1509     History Chief Complaint  Patient presents with  . Cough    Shirley [name] is a 44 y.o. female.  HPI Patient is a 44 year old female with a medical history as noted below.  Patient states for the past few days she began experiencing a dry cough, fatigue, rhinorrhea.  She states last night she started feeling a bit short of breath but this has since alleviated.  She reports an episode of nausea and vomiting yesterday but denies any recurrences since.  No current nausea or vomiting.  She is fully vaccinated for COVID-19.  She states that her significant other as well as her child are both experiencing similar symptoms and have both been around other sick contacts.  No sore throat, ear pain, chest pain, abdominal pain, urinary changes, leg swelling, calf pain.    Past Medical History:  Diagnosis Date  . ADHD (attention deficit hyperactivity disorder)   . ALLERGIC RHINITIS 06/26/2008  . ANEMIA-IRON DEFICIENCY 06/26/2008  . 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  . ANKLE PAIN, LEFT 06/26/2008  . BACK PAIN, LUMBAR 10/15/2007  . BIPOLAR DISORDER UNSPECIFIED 09/13/2007  . CERVICAL RADICULOPATHY, LEFT 06/26/2008  . Cervicalgia 10/15/2007  . Chronic pain syndrome 03/21/2011  . COMMON MIGRAINE 06/26/2008  . DIABETES MELLITUS, TYPE II 06/05/2007  . DYSPNEA 12/05/2007  . EAR PAIN, RIGHT 12/05/2007  . ELEVATED BP READING WITHOUT DX HYPERTENSION 07/29/2009  . FATIGUE 12/05/2007  . FIBROMYALGIA 06/26/2008  . GASTROENTERITIS, ACUTE 12/15/2008  . GERD 06/26/2008  . Headache(784.0) 09/13/2007  . HYPERLIPIDEMIA 09/13/2007  . JOINT EFFUSION, RIGHT KNEE 07/29/2009  . KNEE PAIN, LEFT 11/21/2010  . LUMBAR RADICULOPATHY, LEFT 06/26/2008  . OTHER DISEASE OF PHARYNX OR NASOPHARYNX 07/03/2008  . PERIPHERAL EDEMA 01/06/2009  . POLYARTHRITIS 11/21/2010  . SHOULDER PAIN, BILATERAL 07/03/2008  . SINUSITIS- ACUTE-NOS 07/29/2009  .  TACHYCARDIA 12/05/2007  . THYROID NODULE, RIGHT 11/20/2008  . TREMOR 01/02/2008  . Vertigo     Patient Active Problem List   Diagnosis Date Noted  . Vertigo   . Toe injury, left, initial encounter 05/22/2019  . Right foot injury, initial encounter 02/08/2018  . Bilateral knee pain 01/14/2018  . MRSA (methicillin resistant staph aureus) urine culture positive on 07/06/16 07/06/2016  . Thoracic back pain 12/20/2015  . Low back pain radiating to right leg 12/01/2015  . Shoulder impingement syndrome, left 11/07/2013  . Encounter for long-term (current) use of high-risk medication 05/29/2011  . Chronic pain syndrome 03/21/2011  . POLYARTHRITIS 11/21/2010  . KNEE PAIN, LEFT 11/21/2010  . SINUSITIS- ACUTE-NOS 07/29/2009  . JOINT EFFUSION, RIGHT KNEE 07/29/2009  . PERIPHERAL EDEMA 01/06/2009  . THYROID NODULE, RIGHT 11/20/2008  . SHOULDER PAIN, BILATERAL 07/03/2008  . GERD 06/26/2008  . CERVICAL RADICULOPATHY, LEFT 06/26/2008  . LUMBAR RADICULOPATHY, LEFT 06/26/2008  . Fibromyalgia 06/26/2008  . ANKLE PAIN, LEFT 06/26/2008  . TACHYCARDIA 12/05/2007  . DYSPNEA 12/05/2007  . Neck pain 10/15/2007  . BACK PAIN, LUMBAR 10/15/2007  . Cervicalgia 10/15/2007  . Hyperlipidemia 09/13/2007  . BIPOLAR DISORDER UNSPECIFIED 09/13/2007  . Type 2 diabetes mellitus without complication, without long-term current use of insulin ([place]) 06/05/2007    Past Surgical History:  Procedure Laterality Date  . back surgury     lumbar 2001  . CESAREAN SECTION     x 3  . thyroid fine needle aspiration  May 2008   showed non neoplastic goiter  .  VAGINAL HYSTERECTOMY     fibroids     OB History    Gravida  3   Para  3   Term  2   Preterm  1   AB      Living  3     SAB      TAB      Ectopic      Multiple      Live Births  3           Family History  Problem Relation Age of Onset  . Thyroid disease Mother   . Diabetes Mother   . Asthma Mother   . Hypertension Father   .  Hyperlipidemia Father   . Diabetes Father   . Emphysema Other   . Coronary artery disease Other   . Cancer Other        pancreatic and breast cancer  . Cancer Other        lung and prostate cancer  . Breast cancer Neg Hx     Social History   Tobacco Use  . Smoking status: Never Smoker  . Smokeless tobacco: Never Used  Substance Use Topics  . Alcohol use: No  . Drug use: No    Home Medications Prior to Admission medications   Medication Sig Start Date End Date Taking? Authorizing Provider  estradiol (ESTRACE) 0.5 MG tablet Take 1 tablet (0.5 mg total) by mouth daily. 02/03/19   Truett Mainland, DO  gabapentin (NEURONTIN) 100 MG capsule Take 1 capsule (100 mg total) by mouth 3 (three) times daily as needed. 06/17/20   Rosemarie Ax, MD  metFORMIN (GLUCOPHAGE) 500 MG tablet Take 1 tablet (500 mg total) by mouth 2 (two) times daily with a meal. Patient not taking: Reported on 07/19/2020 01/11/18   Truett Mainland, DO  metoprolol tartrate (LOPRESSOR) 50 MG tablet Take 1 tablet (50 mg total) by mouth 2 (two) times daily. 07/19/20 08/18/20  Long, Wonda Olds, MD  oxyCODONE-acetaminophen (PERCOCET/ROXICET) 5-325 MG tablet Take 1 tablet by mouth every 6 (six) hours as needed for severe pain. 06/17/20   Molpus, John, MD  progesterone (PROMETRIUM) 200 MG capsule Take 1 capsule (200 mg total) by mouth daily. Patient not taking: Reported on 07/19/2020 01/30/19   Truett Mainland, DO  sitaGLIPtin (JANUVIA) 50 MG tablet Take 1 tablet (50 mg total) by mouth daily. 07/19/20   Richardo Priest, MD    Allergies    Sulfa antibiotics, Gadolinium derivatives, Promethazine hcl, and Reglan [metoclopramide]  Review of Systems   Review of Systems  All other systems reviewed and are negative. Ten systems reviewed and are negative for acute change, except as noted in the HPI.   Physical Exam Updated Vital Signs BP (!) 136/100 (BP Location: Right Arm)   Pulse (!) 105   Temp 98.1 F (36.7 C) (Oral)   Resp  18   Ht 5\' 4"  (1.626 m)   Wt 93 kg   SpO2 100%   BMI 35.19 kg/m   Physical Exam Vitals and nursing note reviewed.  Constitutional:      General: She is not in acute distress.    Appearance: Normal appearance. She is not ill-appearing, toxic-appearing or diaphoretic.  HENT:     Head: Normocephalic and atraumatic.     Right Ear: External ear normal.     Left Ear: External ear normal.     Nose: Nose normal.     Mouth/Throat:     Mouth: Mucous membranes  are moist.     Pharynx: Oropharynx is clear. No oropharyngeal exudate or posterior oropharyngeal erythema.     Comments: Uvula midline.  No erythema noted in the posterior oropharynx.  Readily handling secretions. Eyes:     General: No scleral icterus.       Right eye: No discharge.        Left eye: No discharge.     Extraocular Movements: Extraocular movements intact.     Conjunctiva/sclera: Conjunctivae normal.  Cardiovascular:     Rate and Rhythm: Regular rhythm. Tachycardia present.     Pulses: Normal pulses.     Heart sounds: Normal heart sounds. No murmur heard.  No friction rub. No gallop.   Pulmonary:     Effort: Pulmonary effort is normal. No respiratory distress.     Breath sounds: Normal breath sounds. No stridor. No wheezing, rhonchi or rales.     Comments: Lungs are clear to auscultation bilaterally. Abdominal:     General: Abdomen is flat.     Palpations: Abdomen is soft.     Tenderness: There is no abdominal tenderness.     Comments: Abdomen is soft and nontender in all 4 quadrants.  Musculoskeletal:        General: Normal range of motion.     Cervical back: Normal range of motion and neck supple. No rigidity or tenderness.  Skin:    General: Skin is warm and dry.  Neurological:     General: No focal deficit present.     Mental Status: She is alert and oriented to person, place, and time.  Psychiatric:        Mood and Affect: Mood normal.        Behavior: Behavior normal.    ED Results / Procedures /  Treatments   Labs (all labs ordered are listed, but only abnormal results are displayed) Labs Reviewed  RESPIRATORY PANEL BY RT PCR (FLU A&B, COVID)   EKG None  Radiology DG Chest 2 View  Result Date: 08/29/2020 CLINICAL DATA:  Short of breath for 3 days, productive cough, fever EXAM: CHEST - 2 VIEW COMPARISON:  07/19/2020 FINDINGS: The heart size and mediastinal contours are within normal limits. Both lungs are clear. The visualized skeletal structures are unremarkable. IMPRESSION: No active cardiopulmonary disease. Electronically Signed   By: Randa Ngo M.D.   On: 08/29/2020 18:19    Procedures Procedures (including critical care time)  Medications Ordered in ED Medications  benzonatate (TESSALON) capsule 100 mg (100 mg Oral Given 08/29/20 1918)    ED Course  I have reviewed the triage vital signs and the nursing notes.  Pertinent labs & imaging results that were available during my care of the patient were reviewed by me and considered in my medical decision making (see chart for details).  Clinical Course as of Aug 29 1956  Sun Aug 29, 2020  1851 No active cardiopulmonary disease.  DG Chest 2 View [LJ]    Clinical Course User Index [LJ] Rayna Sexton, PA-C   MDM Rules/Calculators/A&P                          Patient presents today with symptoms consistent with a viral URI.  She is fully vaccinated for COVID-19 but was concerned that she could possibly have COVID-19 so she came to the emergency department for further evaluation.  Patient reports multiple sick contacts in her household with similar symptoms.  Patient mildly tachycardic but without murmurs, rubs,  gallops.  Her lungs are clear to auscultation bilaterally.  Oxygen saturations are 100% on room air during my exam.  Breathing unlabored.  Respiratory panel and chest x-ray were obtained in triage.  Chest x-ray shows no active cardiopulmonary disease.  This was personally reviewed and evaluated myself.   Respiratory panel was negative.  Patient states that she was feeling mildly short of breath last night but this is improved.  No current shortness of breath.  No abdominal pain, nausea, vomiting, diarrhea.  Recommended management of her cough with Tessalon and she was given a dose in the emergency department.    Do not feel that further work-up was warranted at this time and patient was agreeable.  Patient was given a prescription for [employer]  OTC medications for symptom management.  She is planning on following up with her PCP next week if her symptoms do not begin to improve.  She understands she can return to the ED with any new or worsening symptoms.  Her questions were answered and she was amicable at the time of discharge.   An After Visit Summary was printed and given to the patient.  Patient discharged to home/self care.  Condition at discharge: Stable  Note: Portions of this report may have been transcribed using voice recognition software. Every effort was made to ensure accuracy; however, inadvertent computerized transcription errors may be present.   Diagnoses Final diagnoses:  Viral URI with cough    Rx / DC Orders ED Discharge Orders         Ordered    benzonatate (TESSALON) 100 MG capsule  3 times daily PRN        08/29/20 1857           Rayna Sexton, PA-C 08/29/20 Eugene Garnet, MD 08/30/20 [phone]

## 2020-08-29 NOTE — ED Triage Notes (Signed)
Pt reports 2 days of cough, feeling SOB, fatigue. States she is fully vaccinated for COIVD

## 2020-08-29 NOTE — Discharge Instructions (Addendum)
I prescribed you a medication called Tessalon Perles.  You can take this up to 3 times a day for management of your cough.  Follow-up with your regular doctor next week for reevaluation.  If you develop worsening symptoms you can always return to the emergency department for reevaluation.  It was a pleasure to meet you.

## 2020-08-31 ENCOUNTER — Encounter (HOSPITAL_BASED_OUTPATIENT_CLINIC_OR_DEPARTMENT_OTHER): Payer: Self-pay | Admitting: *Deleted

## 2020-08-31 ENCOUNTER — Emergency Department (HOSPITAL_BASED_OUTPATIENT_CLINIC_OR_DEPARTMENT_OTHER)
Admission: EM | Admit: 2020-08-31 | Discharge: 2020-08-31 | Disposition: A | Discharge status: Home / Self Care | Payer: BC Managed Care – PPO | Attending: Emergency Medicine | Admitting: Emergency Medicine

## 2020-08-31 ENCOUNTER — Other Ambulatory Visit: Payer: Self-pay

## 2020-08-31 DIAGNOSIS — E119 Type 2 diabetes mellitus without complications: Secondary | ICD-10-CM | POA: Diagnosis not present

## 2020-08-31 DIAGNOSIS — J069 Acute upper respiratory infection, unspecified: Secondary | ICD-10-CM | POA: Insufficient documentation

## 2020-08-31 DIAGNOSIS — R059 Cough, unspecified: Secondary | ICD-10-CM | POA: Diagnosis present

## 2020-08-31 DIAGNOSIS — Z7984 Long term (current) use of oral hypoglycemic drugs: Secondary | ICD-10-CM | POA: Diagnosis not present

## 2020-08-31 LAB — D-DIMER, QUANTITATIVE: D-Dimer, Quant: 0.5 ug/mL-FEU (ref 0.00–0.50)

## 2020-08-31 LAB — BASIC METABOLIC PANEL
Anion gap: 9 (ref 5–15)
BUN: 7 mg/dL (ref 6–20)
CO2: 25 mmol/L (ref 22–32)
Calcium: 9.6 mg/dL (ref 8.9–10.3)
Chloride: 99 mmol/L (ref 98–111)
Creatinine, Ser: 0.74 mg/dL (ref 0.44–1.00)
GFR, Estimated: >60 mL/min (ref >60)
Glucose, Bld: 176 mg/dL — ABNORMAL HIGH (ref 70–99)
Potassium: 3.6 mmol/L (ref 3.5–5.1)
Sodium: 133 mmol/L — ABNORMAL LOW (ref 135–145)

## 2020-08-31 LAB — CBC WITH DIFFERENTIAL/PLATELET
Abs Immature Granulocytes: 0.03 10*3/uL (ref 0.00–0.07)
Basophils Absolute: 0.1 10*3/uL (ref 0.0–0.1)
Basophils Relative: 1 %
Eosinophils Absolute: 0.2 10*3/uL (ref 0.0–0.5)
Eosinophils Relative: 3 %
HCT: 43.7 % (ref 36.0–46.0)
Hemoglobin: 13.9 g/dL (ref 12.0–15.0)
Immature Granulocytes: 0 %
Lymphocytes Relative: 35 %
Lymphs Abs: 2.8 10*3/uL (ref 0.7–4.0)
MCH: 27.1 pg (ref 26.0–34.0)
MCHC: 31.8 g/dL (ref 30.0–36.0)
MCV: 85.2 fL (ref 80.0–100.0)
Monocytes Absolute: 0.7 10*3/uL (ref 0.1–1.0)
Monocytes Relative: 8 %
Neutro Abs: 4.2 10*3/uL (ref 1.7–7.7)
Neutrophils Relative %: 53 %
Platelets: 381 10*3/uL (ref 150–400)
RBC: 5.13 MIL/uL — ABNORMAL HIGH (ref 3.87–5.11)
RDW: 13.2 % (ref 11.5–15.5)
WBC: 7.9 10*3/uL (ref 4.0–10.5)
nRBC: 0 % (ref 0.0–0.2)

## 2020-08-31 MED ORDER — IPRATROPIUM-ALBUTEROL 20-100 MCG/ACT IN AERS
2.0000 | INHALATION_SPRAY | Freq: Four times a day (QID) | RESPIRATORY_TRACT | Status: DC
  Administered 2020-08-31: 2 via RESPIRATORY_TRACT
  Filled 2020-08-31: qty 4

## 2020-08-31 NOTE — Discharge Instructions (Signed)
You have been seen here for URI like symptoms. I have prescribed you a inhaler please use every 6 hours as needed for cough for the next 5 days. I recommend taking Tylenol for fever control and ibuprofen for pain control please follow dosing on the back of bottle.  I recommend staying hydrated and if you do not an appetite, I recommend soups as this will provide you with fluids and calories.    Would like to follow-up with your PCP for further evaluation in 7 days time symptoms not fully resolved.  Come back to the emergency department if you develop chest pain, shortness of breath, severe abdominal pain, uncontrolled nausea, vomiting, diarrhea.

## 2020-08-31 NOTE — ED Triage Notes (Signed)
Pt c/o cont cough , seen here 3 days ago for same

## 2020-08-31 NOTE — ED Provider Notes (Signed)
[facility] EMERGENCY DEPARTMENT Provider Note   CSN: [id number] Arrival date & time: 08/31/20  1511     History Chief Complaint  Patient presents with  . Cough    Shirley [name] is a 44 y.o. female.  HPI   Patient with significant medical history of ADHD, tachycardia, diabetes, fibromyalgia presents to the emergency department with chief complaint of URI-like symptoms and shortness of breath.  Patient states she has had URI-like symptoms nasal congestion, productive cough, fevers, chills for the last 4 days and states they are somewhat improving but she is experiencing worsening coughing fits and shortness of breath mainly at nighttime.  She states at night she goes to lay down and she starts to cough and feels like she is gasping for air.  She states this sometimes happens during the day but mainly happens at nighttime.  She denies chest pain, becoming diaphoretic, paresthesias or weakness in the upper or lower extremities.  She denies recent leg pain, leg swelling, recent mobilization surgeries has no history of DVTs or PEs is currently not on hormone therapy.  Patient denies any lung history and denies any smoking history.  She was recently seen here in the emergency department on 11/7 where she was diagnosed with viral URI.  She endorses that she is Covid vaccinated but her family members have had viral illness for the last few days.  She denies any alleviating factors at this time.  Patient denies headaches, ear pain, sore throat, chest pain, abdominal pain, nausea, vomiting, diarrhea, pedal edema.  Past Medical History:  Diagnosis Date  . ADHD (attention deficit hyperactivity disorder)   . ALLERGIC RHINITIS 06/26/2008  . ANEMIA-IRON DEFICIENCY 06/26/2008  . 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  . ANKLE PAIN, LEFT 06/26/2008  . BACK PAIN, LUMBAR 10/15/2007  . BIPOLAR DISORDER UNSPECIFIED 09/13/2007  . CERVICAL RADICULOPATHY, LEFT 06/26/2008  . Cervicalgia 10/15/2007  . Chronic pain syndrome  03/21/2011  . COMMON MIGRAINE 06/26/2008  . DIABETES MELLITUS, TYPE II 06/05/2007  . DYSPNEA 12/05/2007  . EAR PAIN, RIGHT 12/05/2007  . ELEVATED BP READING WITHOUT DX HYPERTENSION 07/29/2009  . FATIGUE 12/05/2007  . FIBROMYALGIA 06/26/2008  . GASTROENTERITIS, ACUTE 12/15/2008  . GERD 06/26/2008  . Headache(784.0) 09/13/2007  . HYPERLIPIDEMIA 09/13/2007  . JOINT EFFUSION, RIGHT KNEE 07/29/2009  . KNEE PAIN, LEFT 11/21/2010  . LUMBAR RADICULOPATHY, LEFT 06/26/2008  . OTHER DISEASE OF PHARYNX OR NASOPHARYNX 07/03/2008  . PERIPHERAL EDEMA 01/06/2009  . POLYARTHRITIS 11/21/2010  . SHOULDER PAIN, BILATERAL 07/03/2008  . SINUSITIS- ACUTE-NOS 07/29/2009  . TACHYCARDIA 12/05/2007  . THYROID NODULE, RIGHT 11/20/2008  . TREMOR 01/02/2008  . Vertigo     Patient Active Problem List   Diagnosis Date Noted  . Vertigo   . Toe injury, left, initial encounter 05/22/2019  . Right foot injury, initial encounter 02/08/2018  . Bilateral knee pain 01/14/2018  . MRSA (methicillin resistant staph aureus) urine culture positive on 07/06/16 07/06/2016  . Thoracic back pain 12/20/2015  . Low back pain radiating to right leg 12/01/2015  . Shoulder impingement syndrome, left 11/07/2013  . Encounter for long-term (current) use of high-risk medication 05/29/2011  . Chronic pain syndrome 03/21/2011  . POLYARTHRITIS 11/21/2010  . KNEE PAIN, LEFT 11/21/2010  . SINUSITIS- ACUTE-NOS 07/29/2009  . JOINT EFFUSION, RIGHT KNEE 07/29/2009  . PERIPHERAL EDEMA 01/06/2009  . THYROID NODULE, RIGHT 11/20/2008  . SHOULDER PAIN, BILATERAL 07/03/2008  . GERD 06/26/2008  . CERVICAL RADICULOPATHY, LEFT 06/26/2008  . LUMBAR RADICULOPATHY, LEFT 06/26/2008  . Fibromyalgia  06/26/2008  . ANKLE PAIN, LEFT 06/26/2008  . TACHYCARDIA 12/05/2007  . DYSPNEA 12/05/2007  . Neck pain 10/15/2007  . BACK PAIN, LUMBAR 10/15/2007  . Cervicalgia 10/15/2007  . Hyperlipidemia 09/13/2007  . BIPOLAR DISORDER UNSPECIFIED 09/13/2007  . Type 2 diabetes mellitus  without complication, without long-term current use of insulin ([place]) 06/05/2007    Past Surgical History:  Procedure Laterality Date  . back surgury     lumbar 2001  . CESAREAN SECTION     x 3  . thyroid fine needle aspiration  May 2008   showed non neoplastic goiter  . VAGINAL HYSTERECTOMY     fibroids     OB History    Gravida  3   Para  3   Term  2   Preterm  1   AB      Living  3     SAB      TAB      Ectopic      Multiple      Live Births  3           Family History  Problem Relation Age of Onset  . Thyroid disease Mother   . Diabetes Mother   . Asthma Mother   . Hypertension Father   . Hyperlipidemia Father   . Diabetes Father   . Emphysema Other   . Coronary artery disease Other   . Cancer Other        pancreatic and breast cancer  . Cancer Other        lung and prostate cancer  . Breast cancer Neg Hx     Social History   Tobacco Use  . Smoking status: Never Smoker  . Smokeless tobacco: Never Used  Substance Use Topics  . Alcohol use: No  . Drug use: No    Home Medications Prior to Admission medications   Medication Sig Start Date End Date Taking? Authorizing Provider  benzonatate (TESSALON) 100 MG capsule Take 1 capsule (100 mg total) by mouth 3 (three) times daily as needed for cough. 08/29/20   Rayna Sexton, PA-C  estradiol (ESTRACE) 0.5 MG tablet Take 1 tablet (0.5 mg total) by mouth daily. 02/03/19   Truett Mainland, DO  gabapentin (NEURONTIN) 100 MG capsule Take 1 capsule (100 mg total) by mouth 3 (three) times daily as needed. 06/17/20   Rosemarie Ax, MD  metoprolol tartrate (LOPRESSOR) 50 MG tablet Take 1 tablet (50 mg total) by mouth 2 (two) times daily. 07/19/20 08/18/20  Long, Wonda Olds, MD  oxyCODONE-acetaminophen (PERCOCET/ROXICET) 5-325 MG tablet Take 1 tablet by mouth every 6 (six) hours as needed for severe pain. 06/17/20   Molpus, John, MD  progesterone (PROMETRIUM) 200 MG capsule Take 1 capsule (200 mg total)  by mouth daily. Patient not taking: Reported on 07/19/2020 01/30/19   Truett Mainland, DO  sitaGLIPtin (JANUVIA) 50 MG tablet Take 1 tablet (50 mg total) by mouth daily. 07/19/20   Richardo Priest, MD    Allergies    Sulfa antibiotics, Gadolinium derivatives, Promethazine hcl, and Reglan [metoclopramide]  Review of Systems   Review of Systems  Constitutional: Positive for chills and fever.  HENT: Positive for congestion. Negative for sore throat.   Respiratory: Positive for cough and shortness of breath.   Cardiovascular: Negative for chest pain.  Gastrointestinal: Negative for abdominal pain, diarrhea, nausea and vomiting.  Genitourinary: Negative for enuresis.  Musculoskeletal: Negative for back pain.  Skin: Negative for rash.  Neurological:  Negative for dizziness.  Hematological: Does not bruise/bleed easily.    Physical Exam Updated Vital Signs BP (!) 154/88   Pulse 98   Temp 98.2 F (36.8 C) (Oral)   Resp 19   Ht 5\' 4"  (1.626 m)   Wt 93 kg   SpO2 100%   BMI 35.19 kg/m   Physical Exam Vitals and nursing note reviewed.  Constitutional:      General: She is not in acute distress.    Appearance: She is not ill-appearing.  HENT:     Head: Normocephalic and atraumatic.     Right Ear: Tympanic membrane, ear canal and external ear normal.     Left Ear: Tympanic membrane, ear canal and external ear normal.     Nose: No congestion.     Mouth/Throat:     Pharynx: No oropharyngeal exudate or posterior oropharyngeal erythema.  Eyes:     Conjunctiva/sclera: Conjunctivae normal.  Cardiovascular:     Rate and Rhythm: Normal rate and regular rhythm.     Pulses: Normal pulses.     Heart sounds: No murmur heard.  No friction rub. No gallop.   Pulmonary:     Effort: No respiratory distress.     Breath sounds: No wheezing, rhonchi or rales.  Abdominal:     Palpations: Abdomen is soft.     Tenderness: There is no abdominal tenderness.  Musculoskeletal:     Right lower leg: No  edema.     Left lower leg: No edema.  Skin:    General: Skin is warm and dry.  Neurological:     Mental Status: She is alert.  Psychiatric:        Mood and Affect: Mood normal.     ED Results / Procedures / Treatments   Labs (all labs ordered are listed, but only abnormal results are displayed) Labs Reviewed  CBC WITH DIFFERENTIAL/PLATELET - Abnormal; Notable for the following components:      Result Value   RBC 5.13 (*)    All other components within normal limits  BASIC METABOLIC PANEL - Abnormal; Notable for the following components:   Sodium 133 (*)    Glucose, Bld 176 (*)    All other components within normal limits  D-DIMER, QUANTITATIVE (NOT AT [facility])    EKG None  Radiology No results found.  Procedures Procedures (including critical care time)  Medications Ordered in ED Medications  Ipratropium-Albuterol (COMBIVENT) respimat 2 puff (2 puffs Inhalation Given 08/31/20 2004)    ED Course  I have reviewed the triage vital signs and the nursing notes.  Pertinent labs & imaging results that were available during my care of the patient were reviewed by me and considered in my medical decision making (see chart for details).    MDM Rules/Calculators/A&P                          Patient presents with URI-like symptoms and worsening shortness of breath.  She is alert, does not appear in acute distress, vital signs significant for tachycardia.  Will order basic lab work-up D-dimer EKG for further evaluation.  Will provide patient with dual neb and reevaluate.  CBC negative for leukocytosis or signs of anemia, CMP shows slight hyponatremia, hyperglycemia of 176, no anion gap noted. D-dimer 0.5 EKG sinus rhythm without signs of ischemia no ST elevation or depression noted.  Patient was reevaluated after providing her with DuoNeb states she is feeling much better,  feels like  she get a full breath of air, lung sounds were clear bilaterally.  Low suspicion for systemic  infection as patient is nontoxic-appearing, vital signs reassuring, no obvious source infection noted on exam.  Low suspicion for pneumonia as lung sounds are clear bilaterally, x-ray was performed on 11/7 which was unremarkable no new findings on exam at indicate to repeat imaging I have low suspicion for PE as patient denies pleuritic chest pain, patient had negative D-dimer.  low suspicion for strep throat as oropharynx was visualized, no erythema or exudates noted.  Low suspicion patient would need  hospitalized due to viral infection as vital signs reassuring, patient is not in respiratory distress. I suspect patient suffering from viral URI will recommend over-the-counter pain medications follow-up with PCP for further evaluation.  Vital signs have remained stable, no indication for hospital admission.   Patient given at home care as well strict return precautions.  Patient verbalized that they understood agreed to said plan.    Final Clinical Impression(s) / ED Diagnoses Final diagnoses:  Viral URI with cough    Rx / DC Orders ED Discharge Orders    None       Aron Baba 08/31/20 2055    Quintella Reichert, MD 08/31/20 2326

## 2020-09-02 ENCOUNTER — Other Ambulatory Visit: Payer: Self-pay

## 2020-09-02 DIAGNOSIS — F909 Attention-deficit hyperactivity disorder, unspecified type: Secondary | ICD-10-CM | POA: Insufficient documentation

## 2020-09-06 ENCOUNTER — Ambulatory Visit: Payer: BC Managed Care – PPO | Admitting: Cardiology

## 2020-09-27 ENCOUNTER — Ambulatory Visit (INDEPENDENT_AMBULATORY_CARE_PROVIDER_SITE_OTHER): Payer: BC Managed Care – PPO | Admitting: Medical

## 2020-09-27 ENCOUNTER — Encounter: Payer: Self-pay | Admitting: Medical

## 2020-09-27 ENCOUNTER — Other Ambulatory Visit: Payer: Self-pay

## 2020-09-27 VITALS — BP 134/84 | HR 115 | Temp 98.5°F | Resp 18 | Ht 65.5 in | Wt 199.2 lb

## 2020-09-27 DIAGNOSIS — R000 Tachycardia, unspecified: Secondary | ICD-10-CM

## 2020-09-27 DIAGNOSIS — I10 Essential (primary) hypertension: Secondary | ICD-10-CM

## 2020-09-27 DIAGNOSIS — E785 Hyperlipidemia, unspecified: Secondary | ICD-10-CM

## 2020-09-27 DIAGNOSIS — F439 Reaction to severe stress, unspecified: Secondary | ICD-10-CM

## 2020-09-27 DIAGNOSIS — E119 Type 2 diabetes mellitus without complications: Secondary | ICD-10-CM | POA: Diagnosis not present

## 2020-09-27 DIAGNOSIS — E010 Iodine-deficiency related diffuse (endemic) goiter: Secondary | ICD-10-CM

## 2020-09-27 MED ORDER — AZITHROMYCIN 250 MG PO TABS: ORAL_TABLET | ORAL | 0 refills | Status: DC

## 2020-09-27 MED ORDER — FLUTICASONE PROPIONATE 50 MCG/ACT NA SUSP: 2.0000 | Freq: Every day | NASAL | 1 refills | Status: DC

## 2020-09-27 MED ORDER — MUPIROCIN 2 % EX OINT: 1.0000 "application " | TOPICAL_OINTMENT | Freq: Two times a day (BID) | CUTANEOUS | 0 refills | Status: DC

## 2020-09-27 NOTE — Patient Instructions (Addendum)
Your blood pressure is well controlled recently. Continue lopressor.  For high level stress could offer meds for anxiety if you feel high level stress is too much.   For diabetes continue januvia 50 mg daily. I put in in future cmp and a1c to get scheduled/next week. Also will get lipid panel. Reminder to do fasting labs.  Hx of tachydardia, beta blockers can help with pulse and well as blood pressure.  By exam enlarged thyroid. Will get tsh, t4 and thyroid US.  For area on lower lip broken skin following cold sore. Will rx mupirocin topical antibiotic. If area is not healing then refer to ENT.  For sinus pressure/infection rx azithromycin antibiotic and flonase.   Follow up 2 weeks  or as needed

## 2020-09-27 NOTE — Progress Notes (Signed)
Subjective:    Patient ID: Shirley [name], female    DOB: 1976/10/10, 44 y.o.   MRN: [id number]  HPI  Pt in for follow up.  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 Not exercising regularly. Non smoker. Rare alcohol use.  Pt needs new pcp.   Pt has htn. She is on lopressor. Pt bp relatively well controlled. On med for about 3 months. Pt states her bp was high back in September when super stressed.   Pt states stressed being single parents. On daughter married, on daughter in college and one senior in high school.   Pt has hx of tachcyardia.   Pt is diabetic. Last a1c in summer was 12.5.  Pt is on januvia.  Pt also states has had cold sore on her lower lip for about 4 days. Now has mild peeled area left side lower lip.  Also nasal congestion and sinus pressure for 2 weeks.     Review of Systems  Constitutional: Negative for chills, fatigue and fever.  HENT: Negative for congestion and drooling.   Respiratory: Negative for cough, chest tightness, shortness of breath and wheezing.   Cardiovascular: Negative for chest pain and palpitations.  Gastrointestinal: Negative for abdominal distention, abdominal pain, anal bleeding, blood in stool, constipation and diarrhea.  Genitourinary: Negative for dysuria and flank pain.  Musculoskeletal: Negative for back pain, neck pain and neck stiffness.  Skin: Negative for rash.  Neurological: Negative for dizziness, seizures, speech difficulty, weakness and headaches.  Hematological: Negative for adenopathy. Does not bruise/bleed easily.  Psychiatric/Behavioral: Negative for agitation, behavioral problems, decreased concentration and self-injury.    Past Medical History:  Diagnosis Date  . ADHD (attention deficit hyperactivity disorder)   . ALLERGIC RHINITIS 06/26/2008  . ANEMIA-IRON DEFICIENCY 06/26/2008  . 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  . ANKLE PAIN, LEFT 06/26/2008  . BACK PAIN, LUMBAR 10/15/2007  . BIPOLAR DISORDER UNSPECIFIED 09/13/2007  . CERVICAL  RADICULOPATHY, LEFT 06/26/2008  . Cervicalgia 10/15/2007  . Chronic pain syndrome 03/21/2011  . COMMON MIGRAINE 06/26/2008  . DIABETES MELLITUS, TYPE II 06/05/2007  . DYSPNEA 12/05/2007  . EAR PAIN, RIGHT 12/05/2007  . ELEVATED BP READING WITHOUT DX HYPERTENSION 07/29/2009  . FATIGUE 12/05/2007  . FIBROMYALGIA 06/26/2008  . GASTROENTERITIS, ACUTE 12/15/2008  . GERD 06/26/2008  . Headache(784.0) 09/13/2007  . HYPERLIPIDEMIA 09/13/2007  . JOINT EFFUSION, RIGHT KNEE 07/29/2009  . KNEE PAIN, LEFT 11/21/2010  . LUMBAR RADICULOPATHY, LEFT 06/26/2008  . OTHER DISEASE OF PHARYNX OR NASOPHARYNX 07/03/2008  . PERIPHERAL EDEMA 01/06/2009  . POLYARTHRITIS 11/21/2010  . SHOULDER PAIN, BILATERAL 07/03/2008  . SINUSITIS- ACUTE-NOS 07/29/2009  . TACHYCARDIA 12/05/2007  . THYROID NODULE, RIGHT 11/20/2008  . TREMOR 01/02/2008  . Vertigo      Social History   Socioeconomic History  . Marital status: Significant Other    Spouse name: Not on file  . Number of children: 3  . Years of education: Not on file  . Highest education level: Not on file  Occupational History    Employer: UNEMPLOYED  Tobacco Use  . Smoking status: Never Smoker  . Smokeless tobacco: Never Used  Vaping Use  . Vaping Use: Never used  Substance and Sexual Activity  . Alcohol use: No  . Drug use: No  . Sexual activity: Not on file  Other Topics Concern  . Not on file  Social History Narrative  . Not on file   Social Determinants of Health   Financial Resource Strain:   . Difficulty of Paying Living  Expenses: Not on file  Food Insecurity:   . Worried About Charity fundraiser in the Last Year: Not on file  . Ran Out of Food in the Last Year: Not on file  Transportation Needs:   . Lack of Transportation (Medical): Not on file  . Lack of Transportation (Non-Medical): Not on file  Physical Activity:   . Days of Exercise per Week: Not on file  . Minutes of Exercise per Session: Not on file  Stress:   . Feeling of Stress : Not on file   Social Connections:   . Frequency of Communication with Friends and Family: Not on file  . Frequency of Social Gatherings with Friends and Family: Not on file  . Attends Religious Services: Not on file  . Active Member of Clubs or Organizations: Not on file  . Attends Archivist Meetings: Not on file  . Marital Status: Not on file  Intimate Partner Violence:   . Fear of Current or Ex-Partner: Not on file  . Emotionally Abused: Not on file  . Physically Abused: Not on file  . Sexually Abused: Not on file    Past Surgical History:  Procedure Laterality Date  . back surgury     lumbar 2001  . CESAREAN SECTION     x 3  . thyroid fine needle aspiration  May 2008   showed non neoplastic goiter  . VAGINAL HYSTERECTOMY     fibroids    Family History  Problem Relation Age of Onset  . Thyroid disease Mother   . Diabetes Mother   . Asthma Mother   . Hypertension Father   . Hyperlipidemia Father   . Diabetes Father   . Emphysema Other   . Coronary artery disease Other   . Cancer Other        pancreatic and breast cancer  . Cancer Other        lung and prostate cancer  . Breast cancer Neg Hx     Allergies  Allergen Reactions  . Sulfa Antibiotics Anaphylaxis  . Gadolinium Derivatives Itching    Severe heaving- can take benadryl without having reaction  . Promethazine Hcl     Iv dose with benadryl causes severe aggrivation  . Reglan [Metoclopramide]     "It makes me crazy"    Current Outpatient Medications on File Prior to Visit  Medication Sig Dispense Refill  . estradiol (ESTRACE) 0.5 MG tablet Take 1 tablet (0.5 mg total) by mouth daily. 90 tablet 3  . fluconazole (DIFLUCAN) 150 MG tablet     . gabapentin (NEURONTIN) 100 MG capsule Take 1 capsule (100 mg total) by mouth 3 (three) times daily as needed. 90 capsule 1  . progesterone (PROMETRIUM) 200 MG capsule Take 1 capsule (200 mg total) by mouth daily. 90 capsule 1  . sitaGLIPtin (JANUVIA) 50 MG tablet Take  1 tablet (50 mg total) by mouth daily. 30 tablet 4  . metoprolol tartrate (LOPRESSOR) 50 MG tablet Take 1 tablet (50 mg total) by mouth 2 (two) times daily. 60 tablet 0   No current facility-administered medications on file prior to visit.    BP 134/84   Pulse (!) 115   Temp 98.5 F (36.9 C) (Oral)   Resp 18   Ht 5' 5.5" (1.664 m)   Wt 199 lb 3.2 oz (90.4 kg)   SpO2 99%   BMI 32.64 kg/m      Objective:   Physical Exam  General Mental Status- Alert.  General Appearance- Not in acute distress.   Skin General: Color- Normal Color. Moisture- Normal Moisture.  Neck Carotid Arteries- Normal color. Moisture- Normal Moisture. No carotid bruits. No JVD.  Chest and Lung Exam Auscultation: Breath Sounds:-Normal.  Cardiovascular Auscultation:Rythm- Regular. Murmurs & Other Heart Sounds:Auscultation of the heart reveals- No Murmurs.  Abdomen Inspection:-Inspeection Normal. Palpation/Percussion:Note:No mass. Palpation and Percussion of the abdomen reveal- Non Tender, Non Distended + BS, no rebound or guarding.   Neurologic Cranial Nerve exam:- CN III-XII intact(No nystagmus), symmetric smile. Strength:- 5/5 equal and symmetric strength both upper and lower extremities.  heent- frontal and maxillary sinus pressure/tender to palpation.  Lower lip- left side lower lip. Looks like cold sore healing.      Assessment & Plan:  Your blood pressure is well controlled recently. Continue lopressor.  For high level stress could offer meds for anxiety if you feel high level stress is too much.   For diabetes continue januvia 50 mg daily. I put in in future cmp and a1c to get scheduled/next week. Also will get lipid panel. Reminder to do fasting labs.  Hx of tachydardia, beta blockers can help with pulse and well as blood pressure.   Follow up 1 month or as needed  [employer], PA-C

## 2020-09-30 ENCOUNTER — Other Ambulatory Visit: Payer: Self-pay

## 2020-09-30 ENCOUNTER — Ambulatory Visit (HOSPITAL_BASED_OUTPATIENT_CLINIC_OR_DEPARTMENT_OTHER)
Admission: RE | Admit: 2020-09-30 | Discharge: 2020-09-30 | Disposition: A | Discharge status: Home / Self Care | Payer: BC Managed Care – PPO | Source: Ambulatory Visit | Attending: Medical | Admitting: Medical

## 2020-09-30 DIAGNOSIS — R000 Tachycardia, unspecified: Secondary | ICD-10-CM | POA: Diagnosis not present

## 2020-09-30 DIAGNOSIS — E010 Iodine-deficiency related diffuse (endemic) goiter: Secondary | ICD-10-CM | POA: Insufficient documentation

## 2020-10-03 NOTE — Progress Notes (Signed)
Cardiology Office Note:    Date:  10/04/2020   ID:  Shirley [name], DOB 1976-06-14, MRN [id number]  PCP:  Mackie Pai, PA-C  Cardiologist:  Shirlee More, MD    Referring MD: Eston Esters, *    ASSESSMENT:    1. Palpitations   2. Sinus tachycardia   3. Right bundle branch block   4. Type 2 diabetes mellitus without complication, without long-term current use of insulin ([place])   5. Multinodular goiter   6. Elevated blood pressure reading    PLAN:    In order of problems listed above:  1. It appears as if the symptoms are due to sinus tachycardia although there is paroxysmal symptoms also that awakened her from her sleep will apply a 7-day ZIO monitor and start her back on a beta-blocker.  2. Stable recent EKG pattern 3. Improved stable on oral therapy 4. And highly suspicious she has thyroid excessive and when asked her second time she allow me to draw thyroid labs today 5. I suspect she has clinical hypertension with a history of hypertension with pregnancy and will benefit from the beta-blocker   Next appointment: 6 weeks   She has pending labs that are sitting in her medication Adjustments/Labs and Tests Ordered: Current medicines are reviewed at length with the patient today.  Concerns regarding medicines are outlined above.  Orders Placed This Encounter  Procedures  . LONG TERM MONITOR (3-14 DAYS)   Meds ordered this encounter  Medications  . metoprolol tartrate (LOPRESSOR) 25 MG tablet    Sig: Take 1 tablet (25 mg total) by mouth 2 (two) times daily.    Dispense:  180 tablet    Refill:  3    Chief Complaint  Patient presents with  . Tachycardia  . Hypertension    History of Present Illness:    Shirley [name] is a 44 y.o. female with a hx of palpitation sinus tachycardia right bundle branch block with diabetes out-of-control and hyponatremia.  She was last seen 07/19/2020 and I initiated oral agent for diabetes.  Compliance with diet,  lifestyle and medications: Yes  Following the visit 08/09/2020 echocardiogram was performed left ventricle is normal in size no hypertrophy ejection fraction 60 to 65% and normal diastolic filling pressures.  The right ventricle is normal there is no significant valvular abnormality.  An EKG in the emergency room 08/31/2020 with the same pattern of right bundle branch block  Repeat BMP 1 month ago showed a sodium of 133 and a glucose of 176 normal creatinine and GFR. Past Medical History:  Diagnosis Date  . ADHD (attention deficit hyperactivity disorder)   . ALLERGIC RHINITIS 06/26/2008  . ANEMIA-IRON DEFICIENCY 06/26/2008  . 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  . ANKLE PAIN, LEFT 06/26/2008  . BACK PAIN, LUMBAR 10/15/2007  . BIPOLAR DISORDER UNSPECIFIED 09/13/2007  . CERVICAL RADICULOPATHY, LEFT 06/26/2008  . Cervicalgia 10/15/2007  . Chronic pain syndrome 03/21/2011  . COMMON MIGRAINE 06/26/2008  . DIABETES MELLITUS, TYPE II 06/05/2007  . DYSPNEA 12/05/2007  . EAR PAIN, RIGHT 12/05/2007  . ELEVATED BP READING WITHOUT DX HYPERTENSION 07/29/2009  . FATIGUE 12/05/2007  . FIBROMYALGIA 06/26/2008  . GASTROENTERITIS, ACUTE 12/15/2008  . GERD 06/26/2008  . Headache(784.0) 09/13/2007  . HYPERLIPIDEMIA 09/13/2007  . JOINT EFFUSION, RIGHT KNEE 07/29/2009  . KNEE PAIN, LEFT 11/21/2010  . LUMBAR RADICULOPATHY, LEFT 06/26/2008  . OTHER DISEASE OF PHARYNX OR NASOPHARYNX 07/03/2008  . PERIPHERAL EDEMA 01/06/2009  . POLYARTHRITIS 11/21/2010  . SHOULDER PAIN, BILATERAL 07/03/2008  .  SINUSITIS- ACUTE-NOS 07/29/2009  . TACHYCARDIA 12/05/2007  . THYROID NODULE, RIGHT 11/20/2008  . TREMOR 01/02/2008  . Vertigo     Past Surgical History:  Procedure Laterality Date  . back surgury     lumbar 2001  . CESAREAN SECTION     x 3  . thyroid fine needle aspiration  May 2008   showed non neoplastic goiter  . VAGINAL HYSTERECTOMY     fibroids    Current Medications: Current Meds  Medication Sig  . estradiol (ESTRACE) 0.5 MG tablet Take  1 tablet (0.5 mg total) by mouth daily.  . fluticasone (FLONASE) 50 MCG/ACT nasal spray Place 2 sprays into both nostrils daily.  Marland Kitchen gabapentin (NEURONTIN) 100 MG capsule Take 1 capsule (100 mg total) by mouth 3 (three) times daily as needed.  . progesterone (PROMETRIUM) 200 MG capsule Take 1 capsule (200 mg total) by mouth daily.  . sitaGLIPtin (JANUVIA) 50 MG tablet Take 1 tablet (50 mg total) by mouth daily.  . [DISCONTINUED] metoprolol tartrate (LOPRESSOR) 50 MG tablet Take 1 tablet (50 mg total) by mouth 2 (two) times daily.     Allergies:   Sulfa antibiotics, Gadolinium derivatives, Promethazine hcl, and Reglan [metoclopramide]   Social History   Socioeconomic History  . Marital status: Significant Other    Spouse name: Not on file  . Number of children: 3  . Years of education: Not on file  . Highest education level: Not on file  Occupational History    Employer: UNEMPLOYED  Tobacco Use  . Smoking status: Never Smoker  . Smokeless tobacco: Never Used  Vaping Use  . Vaping Use: Never used  Substance and Sexual Activity  . Alcohol use: No  . Drug use: No  . Sexual activity: Not on file  Other Topics Concern  . Not on file  Social History Narrative  . Not on file   Social Determinants of Health   Financial Resource Strain: Not on file  Food Insecurity: Not on file  Transportation Needs: Not on file  Physical Activity: Not on file  Stress: Not on file  Social Connections: Not on file     Family History: The patient's family history includes Asthma in her mother; Cancer in some other family members; Coronary artery disease in an other family member; Diabetes in her father and mother; Emphysema in an other family member; Hyperlipidemia in her father; Hypertension in her father; Thyroid disease in her mother. There is no history of Breast cancer. ROS:   Please see the history of present illness.    All other systems reviewed and are negative.  EKGs/Labs/Other Studies  Reviewed:    The following studies were reviewed today:    Recent Labs: 01/07/2020: ALT 25 07/19/2020: TSH 2.611 08/31/2020: BUN 7; Creatinine, Ser 0.74; Hemoglobin 13.9; Platelets 381; Potassium 3.6; Sodium 133  Recent Lipid Panel    Component Value Date/Time   CHOL 251 (H) 01/10/2018 1035   TRIG 130 01/10/2018 1035   HDL 51 01/10/2018 1035   CHOLHDL 4.9 (H) 01/10/2018 1035   CHOLHDL 5 05/26/2011 1238   VLDL 22.8 05/26/2011 1238   LDLCALC 174 (H) 01/10/2018 1035   LDLDIRECT 165.1 05/26/2011 1238    Physical Exam:    VS:  BP 135/90   Pulse (!) 112   Ht 5' 5.5" (1.664 m)   Wt 199 lb (90.3 kg)   SpO2 99%   BMI 32.61 kg/m     Wt Readings from Last 3 Encounters:  10/04/20 199  lb (90.3 kg)  09/27/20 199 lb 3.2 oz (90.4 kg)  08/31/20 205 lb (93 kg)     GEN:  Well nourished, well developed in no acute distress HEENT: Normal NECK: No JVD; No carotid bruits LYMPHATICS: No lymphadenopathy CARDIAC: RRR, no murmurs, rubs, gallops RESPIRATORY:  Clear to auscultation without rales, wheezing or rhonchi  ABDOMEN: Soft, non-tender, non-distended MUSCULOSKELETAL:  No edema; No deformity  SKIN: Warm and dry NEUROLOGIC:  Alert and oriented x 3 PSYCHIATRIC:  Normal affect    Signed, Shirlee More, MD  10/04/2020 4:00 PM    [facility]

## 2020-10-04 ENCOUNTER — Encounter: Payer: Self-pay | Admitting: Cardiology

## 2020-10-04 ENCOUNTER — Other Ambulatory Visit: Payer: Self-pay

## 2020-10-04 ENCOUNTER — Ambulatory Visit (INDEPENDENT_AMBULATORY_CARE_PROVIDER_SITE_OTHER): Payer: BC Managed Care – PPO | Admitting: Cardiology

## 2020-10-04 ENCOUNTER — Ambulatory Visit (INDEPENDENT_AMBULATORY_CARE_PROVIDER_SITE_OTHER): Payer: BC Managed Care – PPO

## 2020-10-04 VITALS — BP 135/90 | HR 112 | Ht 65.5 in | Wt 199.0 lb

## 2020-10-04 DIAGNOSIS — R002 Palpitations: Secondary | ICD-10-CM

## 2020-10-04 DIAGNOSIS — R000 Tachycardia, unspecified: Secondary | ICD-10-CM

## 2020-10-04 DIAGNOSIS — I4510 Unspecified right bundle-branch block: Secondary | ICD-10-CM | POA: Diagnosis not present

## 2020-10-04 DIAGNOSIS — R030 Elevated blood-pressure reading, without diagnosis of hypertension: Secondary | ICD-10-CM

## 2020-10-04 DIAGNOSIS — E042 Nontoxic multinodular goiter: Secondary | ICD-10-CM

## 2020-10-04 DIAGNOSIS — E785 Hyperlipidemia, unspecified: Secondary | ICD-10-CM

## 2020-10-04 DIAGNOSIS — E010 Iodine-deficiency related diffuse (endemic) goiter: Secondary | ICD-10-CM

## 2020-10-04 DIAGNOSIS — E119 Type 2 diabetes mellitus without complications: Secondary | ICD-10-CM | POA: Diagnosis not present

## 2020-10-04 MED ORDER — METOPROLOL TARTRATE 25 MG PO TABS: 25.0000 mg | ORAL_TABLET | Freq: Two times a day (BID) | ORAL | 3 refills | Status: DC

## 2020-10-04 NOTE — Patient Instructions (Addendum)
Medication Instructions:  Your physician has recommended you make the following change in your medication: DECREASE: Lopressor 25 mg take one tablet by mouth twice daily  *If you need a refill on your cardiac medications before your next appointment, please call your pharmacy*   Lab Work: Your physician recommends that you return for lab work in: TODAY Lipids, HGB A1C, TSH, T3, T4, CMP If you have labs (blood work) drawn today and your tests are completely normal, you will receive your results only by: Marland Kitchen MyChart Message (if you have MyChart) OR . A paper copy in the mail If you have any lab test that is abnormal or we need to change your treatment, we will call you to review the results.   Testing/Procedures: A zio monitor was ordered today. It will remain on for 7 days. You will then return monitor and event diary in provided box. It takes 1-2 weeks for report to be downloaded and returned to [name]. We will call you with the results. If monitor falls off or has orange flashing light, please call Zio for further instructions.      Follow-Up: At [facility], you and your health needs are our priority.  As part of our continuing mission to provide you with exceptional heart care, we have created designated Provider Care Teams.  These Care Teams include your primary Cardiologist (physician) and Advanced Practice Providers (APPs -  Physician Assistants and Nurse Practitioners) who all work together to provide you with the care you need, when you need it.  We recommend signing up for the patient portal called "MyChart".  Sign up information is provided on this After Visit Summary.  MyChart is used to connect with patients for Virtual Visits (Telemedicine).  Patients are able to view lab/test results, encounter notes, upcoming appointments, etc.  Non-urgent messages can be sent to your provider as well.   To learn more about what you can do with MyChart, go to [email or web address].    Your  next appointment:   6 week(s)  The format for your next appointment:   In Person  Provider:   Shirlee More, MD   Other Instructions

## 2020-10-05 ENCOUNTER — Telehealth: Payer: Self-pay | Admitting: Medical

## 2020-10-05 DIAGNOSIS — E042 Nontoxic multinodular goiter: Secondary | ICD-10-CM

## 2020-10-05 DIAGNOSIS — E010 Iodine-deficiency related diffuse (endemic) goiter: Secondary | ICD-10-CM

## 2020-10-05 DIAGNOSIS — E119 Type 2 diabetes mellitus without complications: Secondary | ICD-10-CM

## 2020-10-05 LAB — COMPREHENSIVE METABOLIC PANEL
ALT: 18 IU/L (ref 0–32)
AST: 20 IU/L (ref 0–40)
Albumin/Globulin Ratio: 1.3 (ref 1.2–2.2)
Albumin: 4.2 g/dL (ref 3.8–4.8)
Alkaline Phosphatase: 120 IU/L (ref 44–121)
BUN/Creatinine Ratio: 13 (ref 9–23)
BUN: 9 mg/dL (ref 6–24)
Bilirubin Total: 0.3 mg/dL (ref 0.0–1.2)
CO2: 22 mmol/L (ref 20–29)
Calcium: 9.7 mg/dL (ref 8.7–10.2)
Chloride: 97 mmol/L (ref 96–106)
Creatinine, Ser: 0.68 mg/dL (ref 0.57–1.00)
GFR calc Af Amer: 121 mL/min/{1.73_m2} (ref >59)
GFR calc non Af Amer: 105 mL/min/{1.73_m2} (ref >59)
Globulin, Total: 3.3 g/dL (ref 1.5–4.5)
Glucose: 335 mg/dL — ABNORMAL HIGH (ref 65–99)
Potassium: 4.2 mmol/L (ref 3.5–5.2)
Sodium: 134 mmol/L (ref 134–144)
Total Protein: 7.5 g/dL (ref 6.0–8.5)

## 2020-10-05 LAB — TSH+T4F+T3FREE
Free T4: 1.22 ng/dL (ref 0.82–1.77)
T3, Free: 2.7 pg/mL (ref 2.0–4.4)
TSH: 0.907 u[IU]/mL (ref 0.450–4.500)

## 2020-10-05 LAB — HEMOGLOBIN A1C
Est. average glucose Bld gHb Est-mCnc: 312 mg/dL
Hgb A1c MFr Bld: 12.5 % — ABNORMAL HIGH (ref 4.8–5.6)

## 2020-10-05 LAB — LIPID PANEL
Chol/HDL Ratio: 4.7 ratio — ABNORMAL HIGH (ref 0.0–4.4)
Cholesterol, Total: 268 mg/dL — ABNORMAL HIGH (ref 100–199)
HDL: 57 mg/dL (ref >39)
LDL Chol Calc (NIH): 183 mg/dL — ABNORMAL HIGH (ref 0–99)
Triglycerides: 152 mg/dL — ABNORMAL HIGH (ref 0–149)
VLDL Cholesterol Cal: 28 mg/dL (ref 5–40)

## 2020-10-05 NOTE — Telephone Encounter (Signed)
Refer to Dr. Kelton Pillar endocrinologist..

## 2020-10-11 ENCOUNTER — Ambulatory Visit: Payer: BC Managed Care – PPO | Admitting: Medical

## 2020-10-19 ENCOUNTER — Ambulatory Visit: Payer: BC Managed Care – PPO | Admitting: Medical

## 2020-10-28 ENCOUNTER — Telehealth: Payer: Self-pay | Admitting: Family Medicine

## 2020-10-28 MED ORDER — GABAPENTIN 100 MG PO CAPS
100.0000 mg | ORAL_CAPSULE | Freq: Three times a day (TID) | ORAL | 1 refills | Status: DC | PRN
Start: 2020-10-28 — End: 2021-04-07

## 2020-10-28 NOTE — Telephone Encounter (Signed)
Refilled gabapentin.   Myra Rude, MD [facility] 10/28/2020, 4:38 PM

## 2020-10-28 NOTE — Telephone Encounter (Signed)
Pt request refill of :   gabapentin (NEURONTIN) 100 MG capsule [[id number]]   Order Details Dose: 100 mg Route: Oral Frequency: 3 times daily PRN  Dispense Quantity: 90 capsule Refills: 1       Sig: Take 1 capsule (100 mg total) by mouth 3 (three) times daily as needed.       --forwarding request to provider that if approved --PT LOV 08/10/20   --pt uses:   [facility] [address] [place], [place] - [id number]  [address], [place] [address]  Phone:  [phone] Fax:  [phone]   --Fausto Skillern

## 2020-11-02 ENCOUNTER — Telehealth: Payer: Self-pay

## 2020-11-02 NOTE — Telephone Encounter (Signed)
Left message on patients voicemail to please return our call.   

## 2020-11-02 NOTE — Telephone Encounter (Signed)
-----   Message from Brian J Munley, MD sent at 11/02/2020 12:15 PM EST ----- This is a good result I like her to stay on her beta-blocker that we resumed last visit. 

## 2020-11-03 ENCOUNTER — Encounter: Payer: Self-pay | Admitting: Endocrinology

## 2020-11-04 ENCOUNTER — Telehealth: Payer: Self-pay

## 2020-11-04 NOTE — Telephone Encounter (Signed)
Left a message to return my call.

## 2020-11-04 NOTE — Telephone Encounter (Signed)
-----   Message from Richardo Priest, MD sent at 11/02/2020 12:15 PM EST ----- This is a good result I like her to stay on her beta-blocker that we resumed last visit.

## 2020-11-04 NOTE — Telephone Encounter (Signed)
Left message on patients voicemail to please return our call.   I will also mail the patient a letter at this time after trying to reach her x3 with no success.

## 2020-11-04 NOTE — Telephone Encounter (Signed)
-----   Message from Brian J Munley, MD sent at 11/02/2020 12:15 PM EST ----- This is a good result I like her to stay on her beta-blocker that we resumed last visit. 

## 2020-11-09 ENCOUNTER — Encounter (HOSPITAL_BASED_OUTPATIENT_CLINIC_OR_DEPARTMENT_OTHER): Payer: Self-pay

## 2020-11-09 ENCOUNTER — Other Ambulatory Visit: Payer: Self-pay

## 2020-11-09 ENCOUNTER — Emergency Department (HOSPITAL_BASED_OUTPATIENT_CLINIC_OR_DEPARTMENT_OTHER)
Admission: EM | Admit: 2020-11-09 | Discharge: 2020-11-09 | Disposition: A | Discharge status: Home / Self Care | Payer: Medicaid Other | Attending: Emergency Medicine | Admitting: Emergency Medicine

## 2020-11-09 DIAGNOSIS — Z7984 Long term (current) use of oral hypoglycemic drugs: Secondary | ICD-10-CM | POA: Diagnosis not present

## 2020-11-09 DIAGNOSIS — J069 Acute upper respiratory infection, unspecified: Secondary | ICD-10-CM | POA: Insufficient documentation

## 2020-11-09 DIAGNOSIS — U071 COVID-19: Secondary | ICD-10-CM | POA: Diagnosis not present

## 2020-11-09 DIAGNOSIS — R059 Cough, unspecified: Secondary | ICD-10-CM | POA: Diagnosis present

## 2020-11-09 DIAGNOSIS — Z20822 Contact with and (suspected) exposure to covid-19: Secondary | ICD-10-CM

## 2020-11-09 DIAGNOSIS — E119 Type 2 diabetes mellitus without complications: Secondary | ICD-10-CM | POA: Diagnosis not present

## 2020-11-09 MED ORDER — IBUPROFEN 400 MG PO TABS
600.0000 mg | ORAL_TABLET | Freq: Once | ORAL | Status: AC
  Administered 2020-11-09: 600 mg via ORAL
  Filled 2020-11-09: qty 1

## 2020-11-09 MED ORDER — METHOCARBAMOL 500 MG PO TABS: 500.0000 mg | ORAL_TABLET | Freq: Two times a day (BID) | ORAL | 0 refills | Status: DC

## 2020-11-09 MED ORDER — FLUTICASONE PROPIONATE 50 MCG/ACT NA SUSP: 2.0000 | Freq: Every day | NASAL | 0 refills | Status: DC

## 2020-11-09 MED ORDER — BENZONATATE 100 MG PO CAPS: 100.0000 mg | ORAL_CAPSULE | Freq: Three times a day (TID) | ORAL | 0 refills | Status: DC

## 2020-11-09 MED ORDER — PROMETHAZINE-DM 6.25-15 MG/5ML PO SYRP: 5.0000 mL | ORAL_SOLUTION | Freq: Four times a day (QID) | ORAL | 0 refills | Status: DC | PRN

## 2020-11-09 NOTE — ED Provider Notes (Signed)
[facility] EMERGENCY DEPARTMENT Provider Note   CSN: [id number] Arrival date & time: 11/09/20  1442     History Chief Complaint  Patient presents with  . Cough    Shirley [name] is a 45 y.o. female.  HPI Patient is a 45 year old female with past medical history detailed below history of anemia due to iron deficiency, chronic pain syndrome, HLD, HTN, tachycardia on beta-blockers with questionable compliance  Patient is presenting today with 3 days of cough, congestion, feeling hot and chills, headache and fatigue.  She states that she has taken some Robitussin with some improvement in her symptoms.  She states that she has known positive COVID exposure.  She denies any chest pain or hemoptysis.  She states that she feels short of breath.  It appears that she was seen 11/7 and again on 11/9 for similar symptoms.  On 11/9 she had a D-dimer that was within normal limits she also has had a CT PE study in the last 6 months.  It appears that she is a poorly controlled diabetic as well as suffers from chronic pain of her low back.  She denies any other associated symptoms.  No aggravating mitigating factors.  She states that she is taking her medicine as prescribed.    Past Medical History:  Diagnosis Date  . ADHD (attention deficit hyperactivity disorder)   . ALLERGIC RHINITIS 06/26/2008  . ANEMIA-IRON DEFICIENCY 06/26/2008  . 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  . ANKLE PAIN, LEFT 06/26/2008  . BACK PAIN, LUMBAR 10/15/2007  . BIPOLAR DISORDER UNSPECIFIED 09/13/2007  . CERVICAL RADICULOPATHY, LEFT 06/26/2008  . Cervicalgia 10/15/2007  . Chronic pain syndrome 03/21/2011  . COMMON MIGRAINE 06/26/2008  . DIABETES MELLITUS, TYPE II 06/05/2007  . DYSPNEA 12/05/2007  . EAR PAIN, RIGHT 12/05/2007  . ELEVATED BP READING WITHOUT DX HYPERTENSION 07/29/2009  . FATIGUE 12/05/2007  . FIBROMYALGIA 06/26/2008  . GASTROENTERITIS, ACUTE 12/15/2008  . GERD 06/26/2008  . Headache(784.0) 09/13/2007  . HYPERLIPIDEMIA  09/13/2007  . JOINT EFFUSION, RIGHT KNEE 07/29/2009  . KNEE PAIN, LEFT 11/21/2010  . LUMBAR RADICULOPATHY, LEFT 06/26/2008  . OTHER DISEASE OF PHARYNX OR NASOPHARYNX 07/03/2008  . PERIPHERAL EDEMA 01/06/2009  . POLYARTHRITIS 11/21/2010  . SHOULDER PAIN, BILATERAL 07/03/2008  . SINUSITIS- ACUTE-NOS 07/29/2009  . TACHYCARDIA 12/05/2007  . THYROID NODULE, RIGHT 11/20/2008  . TREMOR 01/02/2008  . Vertigo     Patient Active Problem List   Diagnosis Date Noted  . ADHD (attention deficit hyperactivity disorder)   . Vertigo   . Toe injury, left, initial encounter 05/22/2019  . Right foot injury, initial encounter 02/08/2018  . Bilateral knee pain 01/14/2018  . MRSA (methicillin resistant staph aureus) urine culture positive on 07/06/16 07/06/2016  . Thoracic back pain 12/20/2015  . Low back pain radiating to right leg 12/01/2015  . Shoulder impingement syndrome, left 11/07/2013  . Encounter for long-term (current) use of high-risk medication 05/29/2011  . Chronic pain syndrome 03/21/2011  . POLYARTHRITIS 11/21/2010  . KNEE PAIN, LEFT 11/21/2010  . SINUSITIS- ACUTE-NOS 07/29/2009  . JOINT EFFUSION, RIGHT KNEE 07/29/2009  . PERIPHERAL EDEMA 01/06/2009  . THYROID NODULE, RIGHT 11/20/2008  . SHOULDER PAIN, BILATERAL 07/03/2008  . GERD 06/26/2008  . CERVICAL RADICULOPATHY, LEFT 06/26/2008  . LUMBAR RADICULOPATHY, LEFT 06/26/2008  . Fibromyalgia 06/26/2008  . ANKLE PAIN, LEFT 06/26/2008  . TACHYCARDIA 12/05/2007  . DYSPNEA 12/05/2007  . Neck pain 10/15/2007  . BACK PAIN, LUMBAR 10/15/2007  . Cervicalgia 10/15/2007  . Hyperlipidemia 09/13/2007  . BIPOLAR  DISORDER UNSPECIFIED 09/13/2007  . Type 2 diabetes mellitus without complication, without long-term current use of insulin ([place]) 06/05/2007    Past Surgical History:  Procedure Laterality Date  . back surgury     lumbar 2001  . CESAREAN SECTION     x 3  . thyroid fine needle aspiration  May 2008   showed non neoplastic goiter  . VAGINAL  HYSTERECTOMY     fibroids     OB History    Gravida  3   Para  3   Term  2   Preterm  1   AB      Living  3     SAB      IAB      Ectopic      Multiple      Live Births  3           Family History  Problem Relation Age of Onset  . Thyroid disease Mother   . Diabetes Mother   . Asthma Mother   . Hypertension Father   . Hyperlipidemia Father   . Diabetes Father   . Emphysema Other   . Coronary artery disease Other   . Cancer Other        pancreatic and breast cancer  . Cancer Other        lung and prostate cancer  . Breast cancer Neg Hx     Social History   Tobacco Use  . Smoking status: Never Smoker  . Smokeless tobacco: Never Used  Vaping Use  . Vaping Use: Never used  Substance Use Topics  . Alcohol use: No  . Drug use: No    Home Medications Prior to Admission medications   Medication Sig Start Date End Date Taking? Authorizing Provider  benzonatate (TESSALON) 100 MG capsule Take 1 capsule (100 mg total) by mouth every 8 (eight) hours. 11/09/20  Yes Charmagne Buhl S, PA  fluticasone (FLONASE) 50 MCG/ACT nasal spray Place 2 sprays into both nostrils daily for 14 days. 11/09/20 11/23/20 Yes Ronella Plunk S, PA  methocarbamol (ROBAXIN) 500 MG tablet Take 1 tablet (500 mg total) by mouth 2 (two) times daily. 11/09/20  Yes Myan Suit S, PA  promethazine-dextromethorphan (PROMETHAZINE-DM) 6.25-15 MG/5ML syrup Take 5 mLs by mouth 4 (four) times daily as needed for cough. 11/09/20  Yes Lucill Mauck, Ova Freshwater S, PA  estradiol (ESTRACE) 0.5 MG tablet Take 1 tablet (0.5 mg total) by mouth daily. 02/03/19   Truett Mainland, DO  fluconazole (DIFLUCAN) 150 MG tablet  07/22/20   [provider]  gabapentin (NEURONTIN) 100 MG capsule Take 1 capsule (100 mg total) by mouth 3 (three) times daily as needed. 10/28/20   Rosemarie Ax, MD  metoprolol tartrate (LOPRESSOR) 25 MG tablet Take 1 tablet (25 mg total) by mouth 2 (two) times daily. 10/04/20 01/02/21   Richardo Priest, MD  progesterone (PROMETRIUM) 200 MG capsule Take 1 capsule (200 mg total) by mouth daily. 01/30/19   Truett Mainland, DO  sitaGLIPtin (JANUVIA) 50 MG tablet Take 1 tablet (50 mg total) by mouth daily. 07/19/20   Richardo Priest, MD    Allergies    Sulfa antibiotics, Gadolinium derivatives, Promethazine hcl, and Reglan [metoclopramide]  Review of Systems   Review of Systems  Constitutional: Positive for chills, fatigue and fever.  HENT: Positive for congestion. Negative for postnasal drip, rhinorrhea and sore throat.   Eyes: Negative for redness.  Respiratory: Positive for cough and shortness of breath.  Cardiovascular: Negative for chest pain and leg swelling.  Gastrointestinal: Negative for abdominal pain, diarrhea, nausea and vomiting.  Endocrine: Negative for polyphagia.  Genitourinary: Negative for dysuria.  Musculoskeletal: Positive for myalgias.  Skin: Negative for rash.  Neurological: Positive for headaches. Negative for syncope.  Psychiatric/Behavioral: Negative for confusion.    Physical Exam Updated Vital Signs BP (!) 150/93 (BP Location: Right Arm)   Pulse (!) 108   Temp 98.2 F (36.8 C) (Oral)   Resp 20   Ht 5\' 4"  (1.626 m)   Wt 87.5 kg   SpO2 100%   BMI 33.13 kg/m   Physical Exam Vitals and nursing note reviewed.  Constitutional:      General: She is not in acute distress.    Appearance: She is obese.     Comments: Morbidly obese 45 year old female sitting in bed pleasant, able answer questions appropriately follow commands  HENT:     Head: Normocephalic and atraumatic.     Nose: Nose normal.  Eyes:     General: No scleral icterus. Cardiovascular:     Rate and Rhythm: Normal rate and regular rhythm.     Pulses: Normal pulses.     Heart sounds: Normal heart sounds.  Pulmonary:     Effort: Pulmonary effort is normal. No respiratory distress.     Breath sounds: No wheezing.  Abdominal:     Palpations: Abdomen is soft.     Tenderness:  There is no abdominal tenderness.  Musculoskeletal:     Cervical back: Normal range of motion and neck supple. No tenderness.     Right lower leg: No edema.     Left lower leg: No edema.  Skin:    General: Skin is warm and dry.     Capillary Refill: Capillary refill takes less than 2 seconds.  Neurological:     Mental Status: She is alert. Mental status is at baseline.  Psychiatric:        Mood and Affect: Mood normal.        Behavior: Behavior normal.     ED Results / Procedures / Treatments   Labs (all labs ordered are listed, but only abnormal results are displayed) Labs Reviewed  SARS CORONAVIRUS 2 (TAT 6-24 HRS)    EKG None  Radiology No results found.  Procedures Procedures (including critical care time)  Medications Ordered in ED Medications  ibuprofen (ADVIL) tablet 600 mg (has no administration in time range)    ED Course  I have reviewed the triage vital signs and the nursing notes.  Pertinent labs & imaging results that were available during my care of the patient were reviewed by me and considered in my medical decision making (see chart for details).    MDM Rules/Calculators/A&P                          Patient is obese 45 year old female pleasant, able to express appropriate follow commands.  Physical exam is unremarkable.  She has very mildly tachycardic heart rate of 100.  I suspect she may be somewhat dehydrated likely secondary to her chronic ongoing mild hyperglycemia.  She understands that she needs to follow-up closely with her primary care doctor to recheck her blood sugars and to continue her outpatient diabetes management.  I suspect CBG would be elevated her baseline around 300 today. She decision-making physician which she is comfortable with no further blood work at this time.  Her lungs are clear to auscultation all  fields.  She is overall very well-appearing although chronically obese and poorly controlled of her diabetes.  She is  afebrile.  I reviewed patient's work-up from November of past year.  Also reviewed her last CT PE study which was negative.  It appears the patient comes to the ER for similar symptoms frequently.  She is a poorly controlled diabetic although this has been ongoing for some time she states she is taking her medications.  There is no indication for lab work today as her symptoms are consistent with a viral URI.  Suspect COVID in the situation.  COVID test obtained and pending.  Shirley [name] was evaluated in Emergency Department on 11/09/2020 for the symptoms described in the history of present illness. She was evaluated in the context of the global COVID-19 pandemic, which necessitated consideration that the patient might be at risk for infection with the SARS-CoV-2 virus that causes COVID-19. Institutional protocols and algorithms that pertain to the evaluation of patients at risk for COVID-19 are in a state of rapid change based on information released by regulatory bodies including the CDC and federal and state organizations. These policies and algorithms were followed during the patient's care in the ED.  Final Clinical Impression(s) / ED Diagnoses Final diagnoses:  Viral URI with cough  Suspected COVID-19 virus infection    Rx / DC Orders ED Discharge Orders         Ordered    methocarbamol (ROBAXIN) 500 MG tablet  2 times daily        11/09/20 2129    promethazine-dextromethorphan (PROMETHAZINE-DM) 6.25-15 MG/5ML syrup  4 times daily PRN        11/09/20 2129    benzonatate (TESSALON) 100 MG capsule  Every 8 hours        11/09/20 2129    fluticasone (FLONASE) 50 MCG/ACT nasal spray  Daily        11/09/20 2129           Tedd Sias, [place] 11/09/20 2140    Fredia Sorrow, MD 11/11/20 1859

## 2020-11-09 NOTE — Discharge Instructions (Signed)
Your COVID test is pending; you should expect results within 24 hours. You can access your results on your MyChart--if you test positive you should receive a phone call.  In the meantime follow CDC guidelines and quarantine, wear a mask, wash hands often.   Please use Tylenol or ibuprofen for pain.  You may use 600 mg ibuprofen every 6 hours or 650 mg of Tylenol every 6 hours.  You may choose to alternate between the 2.  This would be most effective.  Not to exceed 4 g of Tylenol within 24 hours.  Not to exceed 3200 mg ibuprofen 24 hours.   Please take over the counter vitamin D 2000-4000 units per day. I also recommend zinc 50 mg per day for the next two weeks.   Please return to ED if you feel have difficulty breathing or have emergent, new or concerning symptoms.  Patients who have symptoms consistent with COVID-19 should self isolated for: At least 3 days (72 hours) have passed since recovery, defined as resolution of fever without the use of fever reducing medications and improvement in respiratory symptoms (e.g., cough, shortness of breath), and At least 7 days have passed since symptoms first appeared.       Person Under Monitoring Name: Shirley [name]  Location: [address] [place] [address]   Infection Prevention Recommendations for Individuals Confirmed to have, or Being Evaluated for, 2019 Novel Coronavirus (COVID-19) Infection Who Receive Care at Home  Individuals who are confirmed to have, or are being evaluated for, COVID-19 should follow the prevention steps below until a healthcare provider or local or state health department says they can return to normal activities.  Stay home except to get medical care You should restrict activities outside your home, except for getting medical care. Do not go to work, school, or public areas, and do not use public transportation or taxis.  Call ahead before visiting your doctor Before your medical  appointment, call the healthcare provider and tell them that you have, or are being evaluated for, COVID-19 infection. This will help the healthcare provider's office take steps to keep other people from getting infected. Ask your healthcare provider to call the local or state health department.  Monitor your symptoms Seek prompt medical attention if your illness is worsening (e.g., difficulty breathing). Before going to your medical appointment, call the healthcare provider and tell them that you have, or are being evaluated for, COVID-19 infection. Ask your healthcare provider to call the local or state health department.  Wear a facemask You should wear a facemask that covers your nose and mouth when you are in the same room with other people and when you visit a healthcare provider. People who live with or visit you should also wear a facemask while they are in the same room with you.  Separate yourself from other people in your home As much as possible, you should stay in a different room from other people in your home. Also, you should use a separate bathroom, if available.  Avoid sharing household items You should not share dishes, drinking glasses, cups, eating utensils, towels, bedding, or other items with other people in your home. After using these items, you should wash them thoroughly with soap and water.  Cover your coughs and sneezes Cover your mouth and nose with a tissue when you cough or sneeze, or you can cough or sneeze into your sleeve. Throw used tissues in a lined trash can, and immediately  wash your hands with soap and water for at least 20 seconds or use an alcohol-based hand rub.  Wash your [employer] your hands often and thoroughly with soap and water for at least 20 seconds. You can use an alcohol-based hand sanitizer if soap and water are not available and if your hands are not visibly dirty. Avoid touching your eyes, nose, and mouth with unwashed  hands.   Prevention Steps for Caregivers and Household Members of Individuals Confirmed to have, or Being Evaluated for, COVID-19 Infection Being Cared for in the Home  If you live with, or provide care at home for, a person confirmed to have, or being evaluated for, COVID-19 infection please follow these guidelines to prevent infection:  Follow healthcare provider's instructions Make sure that you understand and can help the patient follow any healthcare provider instructions for all care.  Provide for the patient's basic needs You should help the patient with basic needs in the home and provide support for getting groceries, prescriptions, and other personal needs.  Monitor the patient's symptoms If they are getting sicker, call his or her medical provider and tell them that the patient has, or is being evaluated for, COVID-19 infection. This will help the healthcare provider's office take steps to keep other people from getting infected. Ask the healthcare provider to call the local or state health department.  Limit the number of people who have contact with the patient If possible, have only one caregiver for the patient. Other household members should stay in another home or place of residence. If this is not possible, they should stay in another room, or be separated from the patient as much as possible. Use a separate bathroom, if available. Restrict visitors who do not have an essential need to be in the home.  Keep older adults, very young children, and other sick people away from the patient Keep older adults, very young children, and those who have compromised immune systems or chronic health conditions away from the patient. This includes people with chronic heart, lung, or kidney conditions, diabetes, and cancer.  Ensure good ventilation Make sure that shared spaces in the home have good air flow, such as from an air conditioner or an opened window, weather  permitting.  Wash your hands often Wash your hands often and thoroughly with soap and water for at least 20 seconds. You can use an alcohol based hand sanitizer if soap and water are not available and if your hands are not visibly dirty. Avoid touching your eyes, nose, and mouth with unwashed hands. Use disposable paper towels to dry your hands. If not available, use dedicated cloth towels and replace them when they become wet.  Wear a facemask and gloves Wear a disposable facemask at all times in the room and gloves when you touch or have contact with the patient's blood, body fluids, and/or secretions or excretions, such as sweat, saliva, sputum, nasal mucus, vomit, urine, or feces.  Ensure the mask fits over your nose and mouth tightly, and do not touch it during use. Throw out disposable facemasks and gloves after using them. Do not reuse. Wash your hands immediately after removing your facemask and gloves. If your personal clothing becomes contaminated, carefully remove clothing and launder. Wash your hands after handling contaminated clothing. Place all used disposable facemasks, gloves, and other waste in a lined container before disposing them with other household waste. Remove gloves and wash your hands immediately after handling these items.  Do not share  dishes, glasses, or other household items with the patient Avoid sharing household items. You should not share dishes, drinking glasses, cups, eating utensils, towels, bedding, or other items with a patient who is confirmed to have, or being evaluated for, COVID-19 infection. After the person uses these items, you should wash them thoroughly with soap and water.  Wash laundry thoroughly Immediately remove and wash clothes or bedding that have blood, body fluids, and/or secretions or excretions, such as sweat, saliva, sputum, nasal mucus, vomit, urine, or feces, on them. Wear gloves when handling laundry from the patient. Read and  follow directions on labels of laundry or clothing items and detergent. In general, wash and dry with the warmest temperatures recommended on the label.  Clean all areas the individual has used often Clean all touchable surfaces, such as counters, tabletops, doorknobs, bathroom fixtures, toilets, phones, keyboards, tablets, and bedside tables, every day. Also, clean any surfaces that may have blood, body fluids, and/or secretions or excretions on them. Wear gloves when cleaning surfaces the patient has come in contact with. Use a diluted bleach solution (e.g., dilute bleach with 1 part bleach and 10 parts water) or a household disinfectant with a label that says EPA-registered for coronaviruses. To make a bleach solution at home, add 1 tablespoon of bleach to 1 quart (4 cups) of water. For a larger supply, add  cup of bleach to 1 gallon (16 cups) of water. Read labels of cleaning products and follow recommendations provided on product labels. Labels contain instructions for safe and effective use of the cleaning product including precautions you should take when applying the product, such as wearing gloves or eye protection and making sure you have good ventilation during use of the product. Remove gloves and wash hands immediately after cleaning.  Monitor yourself for signs and symptoms of illness Caregivers and household members are considered close contacts, should monitor their health, and will be asked to limit movement outside of the home to the extent possible. Follow the monitoring steps for close contacts listed on the symptom monitoring form.   ? If you have additional questions, contact your local health department or call the epidemiologist on call at [phone] (available 24/7). ? This guidance is subject to change. For the most up-to-date guidance from [facility], please refer to their website: [email or web address]

## 2020-11-09 NOTE — ED Notes (Signed)
Pt tolerated PO challenge

## 2020-11-09 NOTE — ED Triage Notes (Signed)
Pt c/o flu like sx started 1/15 with +covid exposure-NAD-steady gait

## 2020-11-10 ENCOUNTER — Encounter: Payer: Self-pay | Admitting: Medical

## 2020-11-10 ENCOUNTER — Ambulatory Visit (HOSPITAL_BASED_OUTPATIENT_CLINIC_OR_DEPARTMENT_OTHER)
Admission: RE | Admit: 2020-11-10 | Discharge: 2020-11-10 | Disposition: A | Discharge status: Home / Self Care | Payer: Medicaid Other | Source: Ambulatory Visit | Attending: Medical | Admitting: Medical

## 2020-11-10 ENCOUNTER — Telehealth (INDEPENDENT_AMBULATORY_CARE_PROVIDER_SITE_OTHER): Payer: Medicaid Other | Admitting: Medical

## 2020-11-10 VITALS — BP 150/93 | HR 108 | Temp 98.2°F

## 2020-11-10 DIAGNOSIS — R062 Wheezing: Secondary | ICD-10-CM | POA: Diagnosis not present

## 2020-11-10 DIAGNOSIS — R059 Cough, unspecified: Secondary | ICD-10-CM

## 2020-11-10 DIAGNOSIS — B349 Viral infection, unspecified: Secondary | ICD-10-CM

## 2020-11-10 DIAGNOSIS — R0602 Shortness of breath: Secondary | ICD-10-CM | POA: Diagnosis not present

## 2020-11-10 DIAGNOSIS — R079 Chest pain, unspecified: Secondary | ICD-10-CM | POA: Diagnosis not present

## 2020-11-10 DIAGNOSIS — J4520 Mild intermittent asthma, uncomplicated: Secondary | ICD-10-CM | POA: Diagnosis not present

## 2020-11-10 LAB — SARS CORONAVIRUS 2 (TAT 6-24 HRS): SARS Coronavirus 2: POSITIVE — AB

## 2020-11-10 MED ORDER — BUDESONIDE-FORMOTEROL FUMARATE 160-4.5 MCG/ACT IN AERO: 2.0000 | INHALATION_SPRAY | Freq: Two times a day (BID) | RESPIRATORY_TRACT | 3 refills | Status: DC

## 2020-11-10 NOTE — Progress Notes (Signed)
Subjective:    Patient ID: Shirley [name], female    DOB: [birth year], 45 y.o.   MRN: [id number]  HPI  Virtual Visit via Video Note  I connected with Shirley [name] on 11/10/20 at 11:20 AM EST by a video enabled telemedicine application and verified that I am speaking with the correct person using two identifiers.  Location: Patient: home Provider: office  Participants- pt and myself.   I discussed the limitations of evaluation and management by telemedicine and the availability of in person appointments. The patient expressed understanding and agreed to proceed.  History of Present Illness: Pt seen in ED.   hpi below.  "Patient is a 45 year old female with past medical history detailed below history of anemia due to iron deficiency, chronic pain syndrome, HLD, HTN, tachycardia on beta-blockers with questionable compliance  Patient is presenting today with 3 days of cough, congestion, feeling hot and chills, headache and fatigue.  She states that she has taken some Robitussin with some improvement in her symptoms.  She states that she has known positive COVID exposure.  She denies any chest pain or hemoptysis.  She states that she feels short of breath.  It appears that she was seen 11/7 and again on 11/9 for similar symptoms.  On 11/9 she had a D-dimer that was within normal limits she also has had a CT PE study in the last 6 months.  It appears that she is a poorly controlled diabetic as well as suffers from chronic pain of her low back.  She denies any other associated symptoms.  No aggravating mitigating factors.  She states that she is taking her medicine as prescribed."   Pt tells me that she feels like needs to bring up mucus but when tries feel like mucus gets stuck. She has concerns for pneumonia. She wants cxr. She stats can feel mucus in back of throat but can't bring up.   Pt given robaxin, promethazine, benzonatate and flonase.  Pharmacy did not have  promethazine.  Pt has flonase and benzonatate already.  Pt did get letter from endocrinologist office but she has not called them yet. Pt on januvia 50 mg daily.  Some wheezing present. She has combivent inhaler.    Observations/Objective:   General-no acute distress, pleasant, oriented. Moderate cough during interview. Lungs- on inspection lungs appear unlabored. Neck- no tracheal deviation or jvd on inspection. Neuro- gross motor function appears intact.  Assessment and Plan: Patient has recent viral syndrome that is today suspicious for possible COVID.  Recommend rest, hydration, low sugar diet and can take Tylenol for fever.  For cough can use benzonatate 100 to 200 mg every 8 hours as needed.  Use Flonase nasal spray for nasal congestion.  Chest x-ray done at the ED yesterday.  After discussion with her do think it is A good idea to go ahead and get chest x-ray.  After x-ray read might prescribe azithromycin.  Reports exposure to COVID.  Explained benefit of going ahead and getting O2 sat monitor in light of her wheezing and described reactive airway history.  Educated patient on O2 sat readings.  If any reading less than 94% let us know.  History of diabetes not well controlled.  Sugar was 300 yesterday in the emergency department.  Stressed importance of low sugar diet particularly when sick with viral illness.  Advised patient to go ahead and take her Januvia daily and her Synjardy.  Go ahead and call endocrinologist office back went over current  illness.  For wheezing prescribed Symbicort inhaler.  If this is filled by the pharmacy and she can hold hold Combivent.  Follow-up in 7 days or as needed.  Follow Up Instructions:    I discussed the assessment and treatment plan with the patient. The patient was provided an opportunity to ask questions and all were answered. The patient agreed with the plan and demonstrated an understanding of the instructions.   The patient  was advised to call back or seek an in-person evaluation if the symptoms worsen or if the condition fails to improve as anticipated.   Time spent with patient today was 30  minutes which consisted of chart review, discussing diagnosis, work up treatment and documentation.  Past Medical History:  Diagnosis Date  . ADHD (attention deficit hyperactivity disorder)   . ALLERGIC RHINITIS 06/26/2008  . ANEMIA-IRON DEFICIENCY 06/26/2008  . 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  . ANKLE PAIN, LEFT 06/26/2008  . BACK PAIN, LUMBAR 10/15/2007  . BIPOLAR DISORDER UNSPECIFIED 09/13/2007  . CERVICAL RADICULOPATHY, LEFT 06/26/2008  . Cervicalgia 10/15/2007  . Chronic pain syndrome 03/21/2011  . COMMON MIGRAINE 06/26/2008  . DIABETES MELLITUS, TYPE II 06/05/2007  . DYSPNEA 12/05/2007  . EAR PAIN, RIGHT 12/05/2007  . ELEVATED BP READING WITHOUT DX HYPERTENSION 07/29/2009  . FATIGUE 12/05/2007  . FIBROMYALGIA 06/26/2008  . GASTROENTERITIS, ACUTE 12/15/2008  . GERD 06/26/2008  . Headache(784.0) 09/13/2007  . HYPERLIPIDEMIA 09/13/2007  . JOINT EFFUSION, RIGHT KNEE 07/29/2009  . KNEE PAIN, LEFT 11/21/2010  . LUMBAR RADICULOPATHY, LEFT 06/26/2008  . OTHER DISEASE OF PHARYNX OR NASOPHARYNX 07/03/2008  . PERIPHERAL EDEMA 01/06/2009  . POLYARTHRITIS 11/21/2010  . SHOULDER PAIN, BILATERAL 07/03/2008  . SINUSITIS- ACUTE-NOS 07/29/2009  . TACHYCARDIA 12/05/2007  . THYROID NODULE, RIGHT 11/20/2008  . TREMOR 01/02/2008  . Vertigo      Social History   Socioeconomic History  . Marital status: Significant Other    Spouse name: Not on file  . Number of children: 3  . Years of education: Not on file  . Highest education level: Not on file  Occupational History    Employer: UNEMPLOYED  Tobacco Use  . Smoking status: Never Smoker  . Smokeless tobacco: Never Used  Vaping Use  . Vaping Use: Never used  Substance and Sexual Activity  . Alcohol use: No  . Drug use: No  . Sexual activity: Not on file  Other Topics Concern  . Not on file  Social  History Narrative  . Not on file   Social Determinants of Health   Financial Resource Strain: Not on file  Food Insecurity: Not on file  Transportation Needs: Not on file  Physical Activity: Not on file  Stress: Not on file  Social Connections: Not on file  Intimate Partner Violence: Not on file    Past Surgical History:  Procedure Laterality Date  . back surgury     lumbar 2001  . CESAREAN SECTION     x 3  . thyroid fine needle aspiration  May 2008   showed non neoplastic goiter  . VAGINAL HYSTERECTOMY     fibroids    Family History  Problem Relation Age of Onset  . Thyroid disease Mother   . Diabetes Mother   . Asthma Mother   . Hypertension Father   . Hyperlipidemia Father   . Diabetes Father   . Emphysema Other   . Coronary artery disease Other   . Cancer Other        pancreatic and breast cancer  .  Cancer Other        lung and prostate cancer  . Breast cancer Neg Hx     Allergies  Allergen Reactions  . Sulfa Antibiotics Anaphylaxis  . Gadolinium Derivatives Itching    Severe heaving- can take benadryl without having reaction  . Promethazine Hcl     Iv dose with benadryl causes severe aggrivation  . Reglan [Metoclopramide]     Makes me very aggitated    Current Outpatient Medications on File Prior to Visit  Medication Sig Dispense Refill  . benzonatate (TESSALON) 100 MG capsule Take 1 capsule (100 mg total) by mouth every 8 (eight) hours. 21 capsule 0  . estradiol (ESTRACE) 0.5 MG tablet Take 1 tablet (0.5 mg total) by mouth daily. 90 tablet 3  . fluconazole (DIFLUCAN) 150 MG tablet     . fluticasone (FLONASE) 50 MCG/ACT nasal spray Place 2 sprays into both nostrils daily for 14 days. 11.1 mL 0  . gabapentin (NEURONTIN) 100 MG capsule Take 1 capsule (100 mg total) by mouth 3 (three) times daily as needed. 90 capsule 1  . methocarbamol (ROBAXIN) 500 MG tablet Take 1 tablet (500 mg total) by mouth 2 (two) times daily. 20 tablet 0  . metoprolol tartrate  (LOPRESSOR) 25 MG tablet Take 1 tablet (25 mg total) by mouth 2 (two) times daily. 180 tablet 3  . progesterone (PROMETRIUM) 200 MG capsule Take 1 capsule (200 mg total) by mouth daily. 90 capsule 1  . promethazine-dextromethorphan (PROMETHAZINE-DM) 6.25-15 MG/5ML syrup Take 5 mLs by mouth 4 (four) times daily as needed for cough. 118 mL 0  . sitaGLIPtin (JANUVIA) 50 MG tablet Take 1 tablet (50 mg total) by mouth daily. 30 tablet 4   No current facility-administered medications on file prior to visit.    There were no vitals taken for this visit.    Mackie Pai, PA-C    Review of Systems  Constitutional: Positive for chills, fatigue and fever.  HENT: Positive for congestion.   Respiratory: Positive for cough and wheezing. Negative for shortness of breath.   Cardiovascular: Negative for chest pain and palpitations.  Gastrointestinal: Negative for abdominal pain.  Musculoskeletal: Positive for myalgias.  Skin: Negative for rash.  Hematological: Negative for adenopathy. Does not bruise/bleed easily.  Psychiatric/Behavioral: Negative for behavioral problems, decreased concentration and dysphoric mood.    Past Medical History:  Diagnosis Date  . ADHD (attention deficit hyperactivity disorder)   . ALLERGIC RHINITIS 06/26/2008  . ANEMIA-IRON DEFICIENCY 06/26/2008  . 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  . ANKLE PAIN, LEFT 06/26/2008  . BACK PAIN, LUMBAR 10/15/2007  . BIPOLAR DISORDER UNSPECIFIED 09/13/2007  . CERVICAL RADICULOPATHY, LEFT 06/26/2008  . Cervicalgia 10/15/2007  . Chronic pain syndrome 03/21/2011  . COMMON MIGRAINE 06/26/2008  . DIABETES MELLITUS, TYPE II 06/05/2007  . DYSPNEA 12/05/2007  . EAR PAIN, RIGHT 12/05/2007  . ELEVATED BP READING WITHOUT DX HYPERTENSION 07/29/2009  . FATIGUE 12/05/2007  . FIBROMYALGIA 06/26/2008  . GASTROENTERITIS, ACUTE 12/15/2008  . GERD 06/26/2008  . Headache(784.0) 09/13/2007  . HYPERLIPIDEMIA 09/13/2007  . JOINT EFFUSION, RIGHT KNEE 07/29/2009  . KNEE PAIN, LEFT  11/21/2010  . LUMBAR RADICULOPATHY, LEFT 06/26/2008  . OTHER DISEASE OF PHARYNX OR NASOPHARYNX 07/03/2008  . PERIPHERAL EDEMA 01/06/2009  . POLYARTHRITIS 11/21/2010  . SHOULDER PAIN, BILATERAL 07/03/2008  . SINUSITIS- ACUTE-NOS 07/29/2009  . TACHYCARDIA 12/05/2007  . THYROID NODULE, RIGHT 11/20/2008  . TREMOR 01/02/2008  . Vertigo      Social History   Socioeconomic History  . Marital  status: Significant Other    Spouse name: Not on file  . Number of children: 3  . Years of education: Not on file  . Highest education level: Not on file  Occupational History    Employer: UNEMPLOYED  Tobacco Use  . Smoking status: Never Smoker  . Smokeless tobacco: Never Used  Vaping Use  . Vaping Use: Never used  Substance and Sexual Activity  . Alcohol use: No  . Drug use: No  . Sexual activity: Not on file  Other Topics Concern  . Not on file  Social History Narrative  . Not on file   Social Determinants of Health   Financial Resource Strain: Not on file  Food Insecurity: Not on file  Transportation Needs: Not on file  Physical Activity: Not on file  Stress: Not on file  Social Connections: Not on file  Intimate Partner Violence: Not on file    Past Surgical History:  Procedure Laterality Date  . back surgury     lumbar 2001  . CESAREAN SECTION     x 3  . thyroid fine needle aspiration  May 2008   showed non neoplastic goiter  . VAGINAL HYSTERECTOMY     fibroids    Family History  Problem Relation Age of Onset  . Thyroid disease Mother   . Diabetes Mother   . Asthma Mother   . Hypertension Father   . Hyperlipidemia Father   . Diabetes Father   . Emphysema Other   . Coronary artery disease Other   . Cancer Other        pancreatic and breast cancer  . Cancer Other        lung and prostate cancer  . Breast cancer Neg Hx     Allergies  Allergen Reactions  . Sulfa Antibiotics Anaphylaxis  . Gadolinium Derivatives Itching    Severe heaving- can take benadryl without  having reaction  . Promethazine Hcl     Iv dose with benadryl causes severe aggrivation  . Reglan [Metoclopramide]     Makes me very aggitated    Current Outpatient Medications on File Prior to Visit  Medication Sig Dispense Refill  . benzonatate (TESSALON) 100 MG capsule Take 1 capsule (100 mg total) by mouth every 8 (eight) hours. 21 capsule 0  . estradiol (ESTRACE) 0.5 MG tablet Take 1 tablet (0.5 mg total) by mouth daily. 90 tablet 3  . fluconazole (DIFLUCAN) 150 MG tablet     . fluticasone (FLONASE) 50 MCG/ACT nasal spray Place 2 sprays into both nostrils daily for 14 days. 11.1 mL 0  . gabapentin (NEURONTIN) 100 MG capsule Take 1 capsule (100 mg total) by mouth 3 (three) times daily as needed. 90 capsule 1  . methocarbamol (ROBAXIN) 500 MG tablet Take 1 tablet (500 mg total) by mouth 2 (two) times daily. 20 tablet 0  . metoprolol tartrate (LOPRESSOR) 25 MG tablet Take 1 tablet (25 mg total) by mouth 2 (two) times daily. 180 tablet 3  . progesterone (PROMETRIUM) 200 MG capsule Take 1 capsule (200 mg total) by mouth daily. 90 capsule 1  . promethazine-dextromethorphan (PROMETHAZINE-DM) 6.25-15 MG/5ML syrup Take 5 mLs by mouth 4 (four) times daily as needed for cough. 118 mL 0  . sitaGLIPtin (JANUVIA) 50 MG tablet Take 1 tablet (50 mg total) by mouth daily. 30 tablet 4   No current facility-administered medications on file prior to visit.    There were no vitals taken for this visit.  Objective:   Physical Exam        Assessment & Plan:

## 2020-11-10 NOTE — Patient Instructions (Addendum)
Patient has recent viral syndrome that is today suspicious for possible COVID.  Recommend rest, hydration, low sugar diet and can take Tylenol for fever.  For cough can use benzonatate 100 to 200 mg every 8 hours as needed.  Use Flonase nasal spray for nasal congestion.  Chest x-ray done at the ED yesterday.  After discussion with her do think it is A good idea to go ahead and get chest x-ray.  After x-ray read might prescribe azithromycin.  Reports exposure to COVID.  Explained benefit of going ahead and getting O2 sat monitor in light of her wheezing and described reactive airway history.  Educated patient on O2 sat readings.  If any reading less than 94% let us know.  History of diabetes not well controlled.  Sugar was 300 yesterday in the emergency department.  Stressed importance of low sugar diet particularly when sick with viral illness.  Advised patient to go ahead and take her Januvia daily and her Synjardy.  Go ahead and call endocrinologist office back went over current illness.  For wheezing prescribed Symbicort inhaler.  If this is filled by the pharmacy and she can hold hold Combivent.  Follow-up in 7 days or as needed.  COVID test is pending.  Asked patient to send me a MyChart update when she gets the result.

## 2020-11-11 ENCOUNTER — Telehealth: Payer: Self-pay | Admitting: Family

## 2020-11-11 NOTE — Telephone Encounter (Signed)
Called to discuss with patient about COVID-19 symptoms and the use of one of the available treatments for those with mild to moderate Covid symptoms and at a high risk of hospitalization.  Pt appears to qualify for outpatient treatment due to co-morbid conditions and/or a member of an at-risk group in accordance with the FDA Emergency Use Authorization.    Symptom onset: 11/06/20 Vaccinated: No Booster? No Immunocompromised? No Qualifiers: Type 2 diabetes, hyperlipidemia, BMI 33, Elevated SVI score  Ms. Alas was seen in the [place] ED on 11/09/20 with 3 day history of cough, congestions, and alternation of feeling hot/chills. Covid test was found to be positive. She is not vaccinated.   I was unable to speak with Ms. Sayer and left a voicemail with call back number and MyChart message for follow up.  Terri Piedra, NP 11/11/2020 8:57 AM

## 2020-11-16 ENCOUNTER — Other Ambulatory Visit: Payer: Self-pay

## 2020-11-16 ENCOUNTER — Telehealth (INDEPENDENT_AMBULATORY_CARE_PROVIDER_SITE_OTHER): Payer: Medicaid Other | Admitting: Medical

## 2020-11-16 VITALS — BP 114/89 | HR 105

## 2020-11-16 DIAGNOSIS — U071 COVID-19: Secondary | ICD-10-CM

## 2020-11-16 DIAGNOSIS — R059 Cough, unspecified: Secondary | ICD-10-CM

## 2020-11-16 DIAGNOSIS — J3489 Other specified disorders of nose and nasal sinuses: Secondary | ICD-10-CM | POA: Diagnosis not present

## 2020-11-16 DIAGNOSIS — J40 Bronchitis, not specified as acute or chronic: Secondary | ICD-10-CM

## 2020-11-16 MED ORDER — AZITHROMYCIN 250 MG PO TABS: ORAL_TABLET | ORAL | 0 refills | Status: DC

## 2020-11-16 MED ORDER — HYDROCODONE-HOMATROPINE 5-1.5 MG/5ML PO SYRP: 5.0000 mL | ORAL_SOLUTION | Freq: Four times a day (QID) | ORAL | 0 refills | Status: DC | PRN

## 2020-11-16 MED ORDER — FLUCONAZOLE 150 MG PO TABS: 150.0000 mg | ORAL_TABLET | Freq: Once | ORAL | 0 refills | Status: AC

## 2020-11-16 NOTE — Progress Notes (Signed)
   Subjective:    Patient ID: Shirley [name], female    DOB: [birth year], 45 y.o.   MRN: [id number]  HPI   Virtual Visit via Video Note  I connected with Shirley [name] on 11/16/20 at 10:40 AM EST by a video enabled telemedicine application and verified that I am speaking with the correct person using two identifiers.  Location: Patient: home Provider: office.  Participants- pt and myself.     I discussed the limitations of evaluation and management by telemedicine and the availability of in person appointments. The patient expressed understanding and agreed to proceed.  History of Present Illness:  Pt in for follow up on covid +.  Pt states she has less cough, less sob, less fatigue and body aches.   Pt states she is bring up mucus when she coughs.  Her smell and taste is improving but not back to normal.   Pt has symbicort and benzonatate.   Pt had + covid test 7 days ago.  Pt did get vaccinated against covid. Got 2 shot series.  Pt notes had odd side effects of covid vaccines. Creeping crawling sensation inside all in her body.     Observations/Objective:  General-no acute distress, pleasant, oriented. Lungs- on inspection lungs appear unlabored. Neck- no tracheal deviation or jvd on inspection. Neuro- gross motor function appears intact.   Assessment and Plan: History of Covid and overall prescribing improving but he still has some residual sinus pressure and chest congestion.  X-ray done on January 19 not show pneumonia.  Recommend that you continue benzonatate for mild cough and Symbicort for shortness of breath and wheezing(if if needed)  Decided go ahead and prescribe azithromycin antibiotic to cover for sinus infection and bronchitis.  Making Hycodan available for severe cough particularly want to make it available as you return to work January 31.  Rx advised and given regarding sedation side effects.  Discussed when to take  medication.  Follow-up in 7 to 10 days or as needed.  Follow Up Instructions:    I discussed the assessment and treatment plan with the patient. The patient was provided an opportunity to ask questions and all were answered. The patient agreed with the plan and demonstrated an understanding of the instructions.   The patient was advised to call back or seek an in-person evaluation if the symptoms worsen or if the condition fails to improve as anticipated.     Mackie Pai, PA-C    Review of Systems     Objective:   Physical Exam        Assessment & Plan:

## 2020-11-16 NOTE — Patient Instructions (Signed)
History of Covid and overall prescribing improving but he still has some residual sinus pressure and chest congestion.  X-ray done on January 19 not show pneumonia.  Recommend that you continue benzonatate for mild cough and Symbicort for shortness of breath and wheezing(if if needed)  Decided go ahead and prescribe azithromycin antibiotic to cover for sinus infection and bronchitis.  Making Hycodan available for severe cough particularly want to make it available as you return to work January 31.  Rx advised and given regarding sedation side effects.  Discussed when to take medication.  Follow-up in 7 to 10 days or as needed.

## 2020-11-23 NOTE — Progress Notes (Signed)
Cardiology Office Note:    Date:  11/24/2020   ID:  Shirley [name], DOB 1976-09-30, MRN [id number]  PCP:  Shirley Pai, PA-C  Cardiologist:  Shirley More, MD    Referring MD: Shirley Pai, PA-C    ASSESSMENT:    1. Sinus tachycardia   2. Right bundle branch block   3. Type 2 diabetes mellitus without complication, without long-term current use of insulin ([place])   4. Pure hypercholesterolemia    PLAN:    In order of problems listed above:  1. Sinus tachycardia is persistent worsened and I suspect it is in the context of COVID-19 infection autonomic dysfunction continue beta-blocker at a low dose of rate limiting calcium channel blocker. 2. Stable EKG pattern 3. Poorly controlled she was referred to endocrinology 4. Start a statin 5. With Covid infection and ongoing symptoms recheck chest x-ray full labs including a troponin proBNP and sedimentation rate clinically she does not have pericarditis   Next appointment: 6 weeks   Medication Adjustments/Labs and Tests Ordered: Current medicines are reviewed at length with the patient today.  Concerns regarding medicines are outlined above.  No orders of the defined types were placed in this encounter.  No orders of the defined types were placed in this encounter.   Chief Complaint  Patient presents with  . Follow-up  . Tachycardia    On beta-blocker    History of Present Illness:    ARYIANA [name] is a 45 y.o. female with a hx of palpitation right bundle branch block and sinus tachycardia in the setting of uncontrolled diabetes and hyponatremia.  She had an echocardiogram 08/09/2020 showing normal left ventricular size systolic function diastolic filling pressures and normal right ventricle.  She was last seen 10/04/2020.  She had COVID-19 infection diagnosed approximately 11/10/2023 has been seen in virtual follow-up by her PCP for respiratory symptoms.   Chest x-ray January 19 did not show progression.   She  utilized an event monitor after that visit for 7 days beginning 10/04/2020 unremarkable the diary events were sinus rhythm and sinus tachycardia the average heart rate was 104 bpm minimum 79 maximum 144.  She had multiple laboratory abnormalities including a hemoglobin A1c 12.5 severe dyslipidemia with an LDL of 183 her thyroid studies were normal.  When I saw her lipid profile we advise statin therapy.   She never started a statin agrees to do it and will start rosuvastatin She has struggled with COVID-19 struggles with fatigue has shortness of breath has irritation in her chest not anginal in nature [name] sharp radiates into the back.  She is used inhalers and it has helped her. Resting heart rate is 121 bpm.  She is taking a beta-blocker. I think much of her troubles autonomic dysfunction with COVID-19 infection [name] put her on a small dose of rate limiting calcium channel blocker along with her beta-blocker check full labs including troponin BNP sedimentation rate CBC and CMP and get a chest x-ray today. Past Medical History:  Diagnosis Date  . ADHD (attention deficit hyperactivity disorder)   . ALLERGIC RHINITIS 06/26/2008  . ANEMIA-IRON DEFICIENCY 06/26/2008  . 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  . ANKLE PAIN, LEFT 06/26/2008  . BACK PAIN, LUMBAR 10/15/2007  . BIPOLAR DISORDER UNSPECIFIED 09/13/2007  . CERVICAL RADICULOPATHY, LEFT 06/26/2008  . Cervicalgia 10/15/2007  . Chronic pain syndrome 03/21/2011  . COMMON MIGRAINE 06/26/2008  . DIABETES MELLITUS, TYPE II 06/05/2007  . DYSPNEA 12/05/2007  . EAR PAIN, RIGHT 12/05/2007  . ELEVATED BP READING  WITHOUT DX HYPERTENSION 07/29/2009  . FATIGUE 12/05/2007  . FIBROMYALGIA 06/26/2008  . GASTROENTERITIS, ACUTE 12/15/2008  . GERD 06/26/2008  . Headache(784.0) 09/13/2007  . HYPERLIPIDEMIA 09/13/2007  . JOINT EFFUSION, RIGHT KNEE 07/29/2009  . KNEE PAIN, LEFT 11/21/2010  . LUMBAR RADICULOPATHY, LEFT 06/26/2008  . OTHER DISEASE OF PHARYNX OR NASOPHARYNX 07/03/2008  .  PERIPHERAL EDEMA 01/06/2009  . POLYARTHRITIS 11/21/2010  . SHOULDER PAIN, BILATERAL 07/03/2008  . SINUSITIS- ACUTE-NOS 07/29/2009  . TACHYCARDIA 12/05/2007  . THYROID NODULE, RIGHT 11/20/2008  . TREMOR 01/02/2008  . Vertigo     Past Surgical History:  Procedure Laterality Date  . back surgury     lumbar 2001  . CESAREAN SECTION     x 3  . thyroid fine needle aspiration  May 2008   showed non neoplastic goiter  . VAGINAL HYSTERECTOMY     fibroids    Current Medications: Current Meds  Medication Sig  . fluticasone (FLONASE) 50 MCG/ACT nasal spray Place 2 sprays into both nostrils daily for 14 days.  Marland Kitchen gabapentin (NEURONTIN) 100 MG capsule Take 1 capsule (100 mg total) by mouth 3 (three) times daily as needed.  Marland Kitchen HYDROcodone-homatropine (HYCODAN) 5-1.5 MG/5ML syrup Take 5 mLs by mouth every 6 (six) hours as needed.  . metoprolol tartrate (LOPRESSOR) 25 MG tablet Take 1 tablet (25 mg total) by mouth 2 (two) times daily.  . sitaGLIPtin (JANUVIA) 50 MG tablet Take 1 tablet (50 mg total) by mouth daily.  . [DISCONTINUED] budesonide-formoterol (SYMBICORT) 160-4.5 MCG/ACT inhaler Inhale 2 puffs into the lungs 2 (two) times daily.     Allergies:   Sulfa antibiotics, Gadolinium derivatives, Promethazine hcl, and Reglan [metoclopramide]   Social History   Socioeconomic History  . Marital status: Significant Other    Spouse name: Not on file  . Number of children: 3  . Years of education: Not on file  . Highest education level: Not on file  Occupational History    Employer: UNEMPLOYED  Tobacco Use  . Smoking status: Never Smoker  . Smokeless tobacco: Never Used  Vaping Use  . Vaping Use: Never used  Substance and Sexual Activity  . Alcohol use: No  . Drug use: No  . Sexual activity: Not on file  Other Topics Concern  . Not on file  Social History Narrative  . Not on file   Social Determinants of Health   Financial Resource Strain: Not on file  Food Insecurity: Not on file   Transportation Needs: Not on file  Physical Activity: Not on file  Stress: Not on file  Social Connections: Not on file     Family History: The patient's family history includes Asthma in her mother; Cancer in some other family members; Coronary artery disease in an other family member; Diabetes in her father and mother; Emphysema in an other family member; Hyperlipidemia in her father; Hypertension in her father; Thyroid disease in her mother. There is no history of Breast cancer. ROS:   Please see the history of present illness.    All other systems reviewed and are negative.  EKGs/Labs/Other Studies Reviewed:    The following studies were reviewed today:  EKG:  EKG ordered today and personally reviewed.  The ekg ordered today demonstrates sinus tachycardia 121 bpm right bundle branch block  Recent Labs: 08/31/2020: Hemoglobin 13.9; Platelets 381 10/04/2020: ALT 18; BUN 9; Creatinine, Ser 0.68; Potassium 4.2; Sodium 134; TSH 0.907  Recent Lipid Panel    Component Value Date/Time   CHOL 268 (  H) 10/04/2020 1616   TRIG 152 (H) 10/04/2020 1616   HDL 57 10/04/2020 1616   CHOLHDL 4.7 (H) 10/04/2020 1616   CHOLHDL 5 05/26/2011 1238   VLDL 22.8 05/26/2011 1238   LDLCALC 183 (H) 10/04/2020 1616   LDLDIRECT 165.1 05/26/2011 1238    Physical Exam:    VS:  BP 120/88   Pulse (!) 121   Ht 5\' 4"  (1.626 m)   Wt 196 lb (88.9 kg)   SpO2 99%   BMI 33.64 kg/m     Wt Readings from Last 3 Encounters:  11/24/20 196 lb (88.9 kg)  11/09/20 193 lb (87.5 kg)  10/04/20 199 lb (90.3 kg)     GEN: She looks washed out and fatigued well nourished, well developed in no acute distress HEENT: Normal NECK: No JVD; No carotid bruits LYMPHATICS: No lymphadenopathy CARDIAC: No pericardial or pleural rub RRR, no murmurs, rubs, gallops RESPIRATORY:  Clear to auscultation without rales, wheezing or rhonchi  ABDOMEN: Soft, non-tender, non-distended MUSCULOSKELETAL:  No edema; No deformity  SKIN:  Warm and dry NEUROLOGIC:  Alert and oriented x 3 PSYCHIATRIC:  Normal affect    Signed, Shirley More, MD  11/24/2020 2:49 PM    [place]

## 2020-11-24 ENCOUNTER — Encounter: Payer: Self-pay | Admitting: Cardiology

## 2020-11-24 ENCOUNTER — Other Ambulatory Visit: Payer: Self-pay

## 2020-11-24 ENCOUNTER — Ambulatory Visit: Payer: BC Managed Care – PPO | Admitting: Cardiology

## 2020-11-24 ENCOUNTER — Ambulatory Visit (HOSPITAL_BASED_OUTPATIENT_CLINIC_OR_DEPARTMENT_OTHER)
Admission: RE | Admit: 2020-11-24 | Discharge: 2020-11-24 | Disposition: A | Discharge status: Home / Self Care | Payer: Medicaid Other | Source: Ambulatory Visit | Attending: Cardiology | Admitting: Cardiology

## 2020-11-24 VITALS — BP 120/88 | HR 121 | Ht 64.0 in | Wt 196.0 lb

## 2020-11-24 DIAGNOSIS — E119 Type 2 diabetes mellitus without complications: Secondary | ICD-10-CM

## 2020-11-24 DIAGNOSIS — I4510 Unspecified right bundle-branch block: Secondary | ICD-10-CM

## 2020-11-24 DIAGNOSIS — U071 COVID-19: Secondary | ICD-10-CM

## 2020-11-24 DIAGNOSIS — E7800 Pure hypercholesterolemia, unspecified: Secondary | ICD-10-CM | POA: Diagnosis not present

## 2020-11-24 DIAGNOSIS — R0602 Shortness of breath: Secondary | ICD-10-CM

## 2020-11-24 DIAGNOSIS — R002 Palpitations: Secondary | ICD-10-CM | POA: Diagnosis not present

## 2020-11-24 DIAGNOSIS — R000 Tachycardia, unspecified: Secondary | ICD-10-CM | POA: Diagnosis not present

## 2020-11-24 DIAGNOSIS — R079 Chest pain, unspecified: Secondary | ICD-10-CM | POA: Diagnosis not present

## 2020-11-24 HISTORY — DX: COVID-19: U07.1

## 2020-11-24 MED ORDER — DILTIAZEM HCL 30 MG PO TABS: 30.0000 mg | ORAL_TABLET | Freq: Two times a day (BID) | ORAL | 3 refills | Status: DC

## 2020-11-24 MED ORDER — ROSUVASTATIN CALCIUM 20 MG PO TABS: 20.0000 mg | ORAL_TABLET | Freq: Every day | ORAL | 3 refills | Status: DC

## 2020-11-24 NOTE — Patient Instructions (Signed)
Medication Instructions:  Your physician has recommended you make the following change in your medication:  START: Crestor 20 mg take one tablet by mouth daily.  START: Cardizem 30 mg take one tablet by mouth twice daily.  *If you need a refill on your cardiac medications before your next appointment, please call your pharmacy*   Lab Work: Your physician recommends that you return for lab work in: TODAY Troponin, CBC, CMP, Sed rate, ProBNP If you have labs (blood work) drawn today and your tests are completely normal, you will receive your results only by: Marland Kitchen MyChart Message (if you have MyChart) OR . A paper copy in the mail If you have any lab test that is abnormal or we need to change your treatment, we will call you to review the results.   Testing/Procedures: We have put in the order for you to have a chest x-ray. Please go downstairs and get this done today.    Follow-Up: At [facility], you and your health needs are our priority.  As part of our continuing mission to provide you with exceptional heart care, we have created designated Provider Care Teams.  These Care Teams include your primary Cardiologist (physician) and Advanced Practice Providers (APPs -  Physician Assistants and Nurse Practitioners) who all work together to provide you with the care you need, when you need it.  We recommend signing up for the patient portal called "MyChart".  Sign up information is provided on this After Visit Summary.  MyChart is used to connect with patients for Virtual Visits (Telemedicine).  Patients are able to view lab/test results, encounter notes, upcoming appointments, etc.  Non-urgent messages can be sent to your provider as well.   To learn more about what you can do with MyChart, go to [email or web address].    Your next appointment:   6 week(s)  The format for your next appointment:   In Person  Provider:   Shirlee More, MD   Other Instructions

## 2020-11-25 ENCOUNTER — Telehealth: Payer: Self-pay

## 2020-11-25 LAB — CBC
Hematocrit: 41.3 % (ref 34.0–46.6)
Hemoglobin: 13.5 g/dL (ref 11.1–15.9)
MCH: 27 pg (ref 26.6–33.0)
MCHC: 32.7 g/dL (ref 31.5–35.7)
MCV: 83 fL (ref 79–97)
Platelets: 430 10*3/uL (ref 150–450)
RBC: 5 x10E6/uL (ref 3.77–5.28)
RDW: 13.2 % (ref 11.7–15.4)
WBC: 7.2 10*3/uL (ref 3.4–10.8)

## 2020-11-25 LAB — TROPONIN T: Troponin T (Highly Sensitive): <6 ng/L (ref 0–14)

## 2020-11-25 LAB — COMPREHENSIVE METABOLIC PANEL
ALT: 17 IU/L (ref 0–32)
AST: 17 IU/L (ref 0–40)
Albumin/Globulin Ratio: 1.2 (ref 1.2–2.2)
Albumin: 4.2 g/dL (ref 3.8–4.8)
Alkaline Phosphatase: 118 IU/L (ref 44–121)
BUN/Creatinine Ratio: 16 (ref 9–23)
BUN: 10 mg/dL (ref 6–24)
Bilirubin Total: 0.2 mg/dL (ref 0.0–1.2)
CO2: 22 mmol/L (ref 20–29)
Calcium: 10.3 mg/dL — ABNORMAL HIGH (ref 8.7–10.2)
Chloride: 102 mmol/L (ref 96–106)
Creatinine, Ser: 0.64 mg/dL (ref 0.57–1.00)
GFR calc Af Amer: 124 mL/min/{1.73_m2} (ref >59)
GFR calc non Af Amer: 107 mL/min/{1.73_m2} (ref >59)
Globulin, Total: 3.6 g/dL (ref 1.5–4.5)
Glucose: 253 mg/dL — ABNORMAL HIGH (ref 65–99)
Potassium: 4.1 mmol/L (ref 3.5–5.2)
Sodium: 139 mmol/L (ref 134–144)
Total Protein: 7.8 g/dL (ref 6.0–8.5)

## 2020-11-25 LAB — SEDIMENTATION RATE: Sed Rate: 38 mm/hr — ABNORMAL HIGH (ref 0–32)

## 2020-11-25 LAB — PRO B NATRIURETIC PEPTIDE: NT-Pro BNP: 6 pg/mL (ref 0–249)

## 2020-11-25 NOTE — Telephone Encounter (Signed)
Spoke with patient regarding results and recommendation.  Patient verbalizes understanding and is agreeable to plan of care. Advised patient to call back with any issues or concerns.  

## 2020-11-25 NOTE — Telephone Encounter (Signed)
-----   Message from Richardo Priest, MD sent at 11/25/2020  7:45 AM EST ----- Good result chest x-ray is normal  No change in treatment I start her on low-dose calcium channel blocker with persistent rapid heart rate I think this will improve her

## 2021-01-03 ENCOUNTER — Other Ambulatory Visit: Payer: Self-pay

## 2021-01-03 ENCOUNTER — Ambulatory Visit (INDEPENDENT_AMBULATORY_CARE_PROVIDER_SITE_OTHER): Payer: Medicaid Other | Admitting: Medical

## 2021-01-03 ENCOUNTER — Encounter: Payer: Self-pay | Admitting: Medical

## 2021-01-03 VITALS — BP 115/78 | HR 100 | Resp 18 | Ht 64.0 in | Wt 190.0 lb

## 2021-01-03 DIAGNOSIS — F324 Major depressive disorder, single episode, in partial remission: Secondary | ICD-10-CM | POA: Diagnosis not present

## 2021-01-03 DIAGNOSIS — R195 Other fecal abnormalities: Secondary | ICD-10-CM | POA: Diagnosis not present

## 2021-01-03 DIAGNOSIS — I10 Essential (primary) hypertension: Secondary | ICD-10-CM

## 2021-01-03 DIAGNOSIS — F439 Reaction to severe stress, unspecified: Secondary | ICD-10-CM

## 2021-01-03 DIAGNOSIS — F419 Anxiety disorder, unspecified: Secondary | ICD-10-CM | POA: Diagnosis not present

## 2021-01-03 DIAGNOSIS — E785 Hyperlipidemia, unspecified: Secondary | ICD-10-CM

## 2021-01-03 DIAGNOSIS — E119 Type 2 diabetes mellitus without complications: Secondary | ICD-10-CM | POA: Diagnosis not present

## 2021-01-03 MED ORDER — ONDANSETRON 4 MG PO TBDP: 4.0000 mg | ORAL_TABLET | Freq: Three times a day (TID) | ORAL | 0 refills | Status: DC | PRN

## 2021-01-03 MED ORDER — SITAGLIPTIN PHOSPHATE 50 MG PO TABS: 50.0000 mg | ORAL_TABLET | Freq: Every day | ORAL | 4 refills | Status: DC

## 2021-01-03 MED ORDER — BUSPIRONE HCL 7.5 MG PO TABS: 7.5000 mg | ORAL_TABLET | Freq: Two times a day (BID) | ORAL | 0 refills | Status: DC

## 2021-01-03 NOTE — Patient Instructions (Addendum)
Recent high-level stress and anxiety related to work.  I will prescribe BuSpar 7.5 mg low dose to take twice daily.  I do think recent conversation with your boss led to the high blood pressures.  Blood pressure was very well controlled presently.  Hypertension well controlled presently.  Continue Cardizem 30 mg daily.  History of diabetes with very high A1c.  Placed endocrinology referral back in December.  Looks like they left a voicemail but you never got that.  I will get CMP and A1c today.  Refilling your Januvia.  To call endocrinologist office at [phone].  Looks like they have attempted to contact you 3 times.  For recent loose stools x1day.  Placed in stool panel orders.  You can go ahead and collect stool panel/kit and turn those in within the hour.  Recommend rest, bland foods and hydrate well but make sure you are using sugar-free beverages such as sugar-free Gatorade or propel fitness water.  You have hyperlipidemia.  Continue Crestor.  Work note given today.  Follow-up in 7 to 10 days or as needed. 

## 2021-01-03 NOTE — Progress Notes (Signed)
Subjective:    Patient ID: Shirley [name], female    DOB: June 25, 1976, 45 y.o.   MRN: [id number]  HPI  Pt in for follow up.  Pt bp is well controlled presently. But states this morning her bp was 168/115 then later 156/114 and then 145/114. Pt states she took her medicine this morning after she took her first reading.   She state when bp was high on first first reading she felt like could hear heart beat.  Pt also states very stressed relating to her job. She does not like her job. States simple job but she states poor relations with boss. She had stress from conversation before she checked bp this morning.   Pt had 2 loose stools last night, one vomiting episode and nauseu  Pt has had partial hysterectomy in 20024.  Pt has hx of uncontrolled diabetes. A1c was 12.2 in the past. Pt is on januvia 50 mg daily. I had put in referral back in Dec 2021. Looks like endocrine office called and left voice mail. Pt states never got.  Hx of high cholesterol. She is on crestor 20 mg daily.      Review of Systems  Constitutional: Negative for chills, fatigue and fever.  Respiratory: Negative for cough, chest tightness, shortness of breath and wheezing.   Cardiovascular: Negative for chest pain and palpitations.  Gastrointestinal: Positive for diarrhea. Negative for abdominal pain, blood in stool, constipation and vomiting.  Genitourinary: Negative for dysuria.  Musculoskeletal: Negative for back pain, myalgias and neck stiffness.  Skin: Negative for color change and rash.  Neurological: Negative for dizziness, seizures, syncope, speech difficulty, weakness, light-headedness and headaches.  Hematological: Negative for adenopathy. Does not bruise/bleed easily.  Psychiatric/Behavioral: Negative for behavioral problems, decreased concentration and dysphoric mood. The patient is nervous/anxious.     Past Medical History:  Diagnosis Date  . ADHD (attention deficit hyperactivity disorder)    . ALLERGIC RHINITIS 06/26/2008  . ANEMIA-IRON DEFICIENCY 06/26/2008  . 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  . ANKLE PAIN, LEFT 06/26/2008  . BACK PAIN, LUMBAR 10/15/2007  . BIPOLAR DISORDER UNSPECIFIED 09/13/2007  . CERVICAL RADICULOPATHY, LEFT 06/26/2008  . Cervicalgia 10/15/2007  . Chronic pain syndrome 03/21/2011  . COMMON MIGRAINE 06/26/2008  . DIABETES MELLITUS, TYPE II 06/05/2007  . DYSPNEA 12/05/2007  . EAR PAIN, RIGHT 12/05/2007  . ELEVATED BP READING WITHOUT DX HYPERTENSION 07/29/2009  . FATIGUE 12/05/2007  . FIBROMYALGIA 06/26/2008  . GASTROENTERITIS, ACUTE 12/15/2008  . GERD 06/26/2008  . Headache(784.0) 09/13/2007  . HYPERLIPIDEMIA 09/13/2007  . JOINT EFFUSION, RIGHT KNEE 07/29/2009  . KNEE PAIN, LEFT 11/21/2010  . LUMBAR RADICULOPATHY, LEFT 06/26/2008  . OTHER DISEASE OF PHARYNX OR NASOPHARYNX 07/03/2008  . PERIPHERAL EDEMA 01/06/2009  . POLYARTHRITIS 11/21/2010  . SHOULDER PAIN, BILATERAL 07/03/2008  . SINUSITIS- ACUTE-NOS 07/29/2009  . TACHYCARDIA 12/05/2007  . THYROID NODULE, RIGHT 11/20/2008  . TREMOR 01/02/2008  . Vertigo      Social History   Socioeconomic History  . Marital status: Significant Other    Spouse name: Not on file  . Number of children: 3  . Years of education: Not on file  . Highest education level: Not on file  Occupational History    Employer: UNEMPLOYED  Tobacco Use  . Smoking status: Never Smoker  . Smokeless tobacco: Never Used  Vaping Use  . Vaping Use: Never used  Substance and Sexual Activity  . Alcohol use: No  . Drug use: No  . Sexual activity: Not on file  Other  Topics Concern  . Not on file  Social History Narrative  . Not on file   Social Determinants of Health   Financial Resource Strain: Not on file  Food Insecurity: Not on file  Transportation Needs: Not on file  Physical Activity: Not on file  Stress: Not on file  Social Connections: Not on file  Intimate Partner Violence: Not on file    Past Surgical History:  Procedure Laterality Date  .  back surgury     lumbar 2001  . CESAREAN SECTION     x 3  . thyroid fine needle aspiration  May 2008   showed non neoplastic goiter  . VAGINAL HYSTERECTOMY     fibroids    Family History  Problem Relation Age of Onset  . Thyroid disease Mother   . Diabetes Mother   . Asthma Mother   . Hypertension Father   . Hyperlipidemia Father   . Diabetes Father   . Emphysema Other   . Coronary artery disease Other   . Cancer Other        pancreatic and breast cancer  . Cancer Other        lung and prostate cancer  . Breast cancer Neg Hx     Allergies  Allergen Reactions  . Sulfa Antibiotics Anaphylaxis  . Gadolinium Derivatives Itching    Severe heaving- can take benadryl without having reaction  . Promethazine Hcl     Iv dose with benadryl causes severe aggrivation  . Reglan [Metoclopramide]     Makes me very aggitated    Current Outpatient Medications on File Prior to Visit  Medication Sig Dispense Refill  . diltiazem (CARDIZEM) 30 MG tablet Take 1 tablet (30 mg total) by mouth 2 (two) times daily. 180 tablet 3  . gabapentin (NEURONTIN) 100 MG capsule Take 1 capsule (100 mg total) by mouth 3 (three) times daily as needed. 90 capsule 1  . HYDROcodone-homatropine (HYCODAN) 5-1.5 MG/5ML syrup Take 5 mLs by mouth every 6 (six) hours as needed. 100 mL 0  . rosuvastatin (CRESTOR) 20 MG tablet Take 1 tablet (20 mg total) by mouth daily. 90 tablet 3  . sitaGLIPtin (JANUVIA) 50 MG tablet Take 1 tablet (50 mg total) by mouth daily. 30 tablet 4  . fluticasone (FLONASE) 50 MCG/ACT nasal spray Place 2 sprays into both nostrils daily for 14 days. 11.1 mL 0  . metoprolol tartrate (LOPRESSOR) 25 MG tablet Take 1 tablet (25 mg total) by mouth 2 (two) times daily. 180 tablet 3   No current facility-administered medications on file prior to visit.    BP 118/88   Pulse 100   Resp 18   Ht '5\' 4"'  (1.626 m)   Wt 190 lb (86.2 kg)   SpO2 100%   BMI 32.61 kg/m      Objective:   Physical  Exam   General Mental Status- Alert. General Appearance- Not in acute distress.   Skin General: Color- Normal Color. Moisture- Normal Moisture.  Neck Carotid Arteries- Normal color. Moisture- Normal Moisture. No carotid bruits. No JVD.  Chest and Lung Exam Auscultation: Breath Sounds:-Normal.  Cardiovascular Auscultation:Rythm- Regular. Murmurs & Other Heart Sounds:Auscultation of the heart reveals- No Murmurs.  Abdomen Inspection:-Inspeection Normal. Palpation/Percussion:Note:No mass. Palpation and Percussion of the abdomen reveal- Non Tender, Non Distended + BS, no rebound or guarding.    Neurologic Cranial Nerve exam:- CN III-XII intact(No nystagmus), symmetric smile. Strength:- 5/5 equal and symmetric strength both upper and lower extremities.  Assessment & Plan:  Recent high-level stress and anxiety related to work.  I will prescribe BuSpar 7.5 mg low dose to take twice daily.  I do think recent conversation with your boss led to the high blood pressures.  Blood pressure was very well controlled presently.  Hypertension well controlled presently.  Continue Cardizem 30 mg daily.  History of diabetes with very high A1c.  Placed endocrinology referral back in December.  Looks like they left a voicemail but you never got that.  I will get CMP and A1c today.  Refilling your Januvia.  To call endocrinologist office at [phone].  Looks like they have attempted to contact you 3 times.  For recent loose stools x1day.  Placed in stool panel orders.  You can go ahead and collect stool panel/kit and turn those in within the hour.  Recommend rest, bland foods and hydrate well but make sure you are using sugar-free beverages such as sugar-free Gatorade or propel fitness water.  You have hyperlipidemia.  Continue Crestor.  Work note given today.  Follow-up in 7 to 10 days or as needed.

## 2021-01-04 ENCOUNTER — Encounter: Payer: Self-pay | Admitting: Medical

## 2021-02-14 ENCOUNTER — Encounter: Payer: Self-pay | Admitting: Family Medicine

## 2021-02-14 ENCOUNTER — Ambulatory Visit (INDEPENDENT_AMBULATORY_CARE_PROVIDER_SITE_OTHER): Payer: Medicaid Other | Admitting: Family Medicine

## 2021-02-14 ENCOUNTER — Other Ambulatory Visit: Payer: Self-pay

## 2021-02-14 DIAGNOSIS — M797 Fibromyalgia: Secondary | ICD-10-CM

## 2021-02-14 MED ORDER — BACLOFEN 10 MG PO TABS: 5.0000 mg | ORAL_TABLET | Freq: Three times a day (TID) | ORAL | 1 refills | Status: DC | PRN

## 2021-02-14 MED ORDER — KETOROLAC TROMETHAMINE 30 MG/ML IJ SOLN
30.0000 mg | Freq: Once | INTRAMUSCULAR | Status: AC
  Administered 2021-02-14: 30 mg via INTRAMUSCULAR

## 2021-02-14 NOTE — Progress Notes (Signed)
Medication Samples have been provided to the patient.  Drug name: Duexis       Strength: 800/26.6 mg       Qty: 2boxes  LOT: [id number]   Exp.Date:02/2021   Dosing instructions: take 1 three times daily  The patient has been instructed regarding the correct time, dose, and frequency of taking this medication, including desired effects and most common side effects.   Jonathon Resides 3:44 PM 02/14/2021

## 2021-02-14 NOTE — Assessment & Plan Note (Addendum)
Acute flare and had covid in January. Possible long covid contribution.  -Counseled on home exercise therapy and supportive care. -IM Toradol. -Duexis samples. -Baclofen. -Could consider physical therapy. -Counseled on gabapentin

## 2021-02-14 NOTE — Progress Notes (Signed)
Shirley [name] - 45 y.o. female MRN [id number]  Date of birth: 1975-11-05  SUBJECTIVE:  Including CC & ROS.  No chief complaint on file.   Shirley [name] is a 45 y.o. female that is presenting with acute on chronic back pain, left shoulder pain right leg and foot pain.  Pain is intermittent in nature.  Has been taking gabapentin with little improvement.  Did get some improvement with the Toradol injection from the emergency department.  Does seem to have some contribution to her fibromyalgia but seems worse in nature.   Review of Systems See HPI   HISTORY: Past Medical, Surgical, Social, and Family History Reviewed & Updated per EMR.   Pertinent Historical Findings include:  Past Medical History:  Diagnosis Date  . ADHD (attention deficit hyperactivity disorder)   . ALLERGIC RHINITIS 06/26/2008  . ANEMIA-IRON DEFICIENCY 06/26/2008  . 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  . ANKLE PAIN, LEFT 06/26/2008  . BACK PAIN, LUMBAR 10/15/2007  . BIPOLAR DISORDER UNSPECIFIED 09/13/2007  . CERVICAL RADICULOPATHY, LEFT 06/26/2008  . Cervicalgia 10/15/2007  . Chronic pain syndrome 03/21/2011  . COMMON MIGRAINE 06/26/2008  . DIABETES MELLITUS, TYPE II 06/05/2007  . DYSPNEA 12/05/2007  . EAR PAIN, RIGHT 12/05/2007  . ELEVATED BP READING WITHOUT DX HYPERTENSION 07/29/2009  . FATIGUE 12/05/2007  . FIBROMYALGIA 06/26/2008  . GASTROENTERITIS, ACUTE 12/15/2008  . GERD 06/26/2008  . Headache(784.0) 09/13/2007  . HYPERLIPIDEMIA 09/13/2007  . JOINT EFFUSION, RIGHT KNEE 07/29/2009  . KNEE PAIN, LEFT 11/21/2010  . LUMBAR RADICULOPATHY, LEFT 06/26/2008  . OTHER DISEASE OF PHARYNX OR NASOPHARYNX 07/03/2008  . PERIPHERAL EDEMA 01/06/2009  . POLYARTHRITIS 11/21/2010  . SHOULDER PAIN, BILATERAL 07/03/2008  . SINUSITIS- ACUTE-NOS 07/29/2009  . TACHYCARDIA 12/05/2007  . THYROID NODULE, RIGHT 11/20/2008  . TREMOR 01/02/2008  . Vertigo     Past Surgical History:  Procedure Laterality Date  . back surgury     lumbar 2001  . CESAREAN  SECTION     x 3  . thyroid fine needle aspiration  May 2008   showed non neoplastic goiter  . VAGINAL HYSTERECTOMY     fibroids    Family History  Problem Relation Age of Onset  . Thyroid disease Mother   . Diabetes Mother   . Asthma Mother   . Hypertension Father   . Hyperlipidemia Father   . Diabetes Father   . Emphysema Other   . Coronary artery disease Other   . Cancer Other        pancreatic and breast cancer  . Cancer Other        lung and prostate cancer  . Breast cancer Neg Hx     Social History   Socioeconomic History  . Marital status: Significant Other    Spouse name: Not on file  . Number of children: 3  . Years of education: Not on file  . Highest education level: Not on file  Occupational History    Employer: UNEMPLOYED  Tobacco Use  . Smoking status: Never Smoker  . Smokeless tobacco: Never Used  Vaping Use  . Vaping Use: Never used  Substance and Sexual Activity  . Alcohol use: No  . Drug use: No  . Sexual activity: Not on file  Other Topics Concern  . Not on file  Social History Narrative  . Not on file   Social Determinants of Health   Financial Resource Strain: Not on file  Food Insecurity: Not on file  Transportation Needs: Not on file  Physical  Activity: Not on file  Stress: Not on file  Social Connections: Not on file  Intimate Partner Violence: Not on file     PHYSICAL EXAM:  VS: BP (!) 122/108 (BP Location: Left Arm, Patient Position: Sitting, Cuff Size: Large)   Ht 5\' 4"  (1.626 m)   Wt 190 lb (86.2 kg)   BMI 32.61 kg/m  Physical Exam Gen: NAD, alert, cooperative with exam, well-appearing    ASSESSMENT & PLAN:   Fibromyalgia Acute flare and had covid in January. Possible long covid contribution.  -Counseled on home exercise therapy and supportive care. -IM Toradol. -Duexis samples. -Baclofen. -Could consider physical therapy. -Counseled on gabapentin

## 2021-02-14 NOTE — Patient Instructions (Signed)
Good to see you Please try the duexis for 4 days straight and then as needed  Please try heat  Please try to stay active  Please try to avoid processed foods or pro-inflammatory foods   Please send me a message in [place] with any questions or updates.  Please see me back in 4 weeks.   --Dr. Raeford Razor

## 2021-03-01 ENCOUNTER — Other Ambulatory Visit: Payer: Self-pay

## 2021-03-01 ENCOUNTER — Ambulatory Visit (INDEPENDENT_AMBULATORY_CARE_PROVIDER_SITE_OTHER): Payer: Medicaid Other | Admitting: Family Medicine

## 2021-03-01 ENCOUNTER — Encounter: Payer: Self-pay | Admitting: Family Medicine

## 2021-03-01 ENCOUNTER — Ambulatory Visit: Payer: Self-pay

## 2021-03-01 VITALS — BP 138/88 | Ht 64.0 in | Wt 190.0 lb

## 2021-03-01 DIAGNOSIS — M25871 Other specified joint disorders, right ankle and foot: Secondary | ICD-10-CM | POA: Insufficient documentation

## 2021-03-01 DIAGNOSIS — M7741 Metatarsalgia, right foot: Secondary | ICD-10-CM | POA: Insufficient documentation

## 2021-03-01 DIAGNOSIS — M79671 Pain in right foot: Secondary | ICD-10-CM

## 2021-03-01 HISTORY — DX: Metatarsalgia, right foot: M77.41

## 2021-03-01 HISTORY — DX: Other specified joint disorders, right ankle and foot: M25.871

## 2021-03-01 NOTE — Assessment & Plan Note (Signed)
Has changes in the posterior compartment to suggest impingement.  Does stand on her feet for long periods of time. -Counseled on home exercise therapy and supportive care. -Green sport insoles. -Heel lifts

## 2021-03-01 NOTE — Assessment & Plan Note (Signed)
Has tenderness at the metatarsal heads. -Green sport insoles with metatarsal pads

## 2021-03-01 NOTE — Progress Notes (Signed)
Shirley [name] - 45 y.o. female MRN [id number]  Date of birth: 1976/05/28  SUBJECTIVE:  Including CC & ROS.  No chief complaint on file.   Shirley [name] is a 45 y.o. female that is presenting with right foot pain.  The pain is been ongoing for a few weeks.  Having pain in the posterior compartment as well as the ball of her forefoot.   Review of Systems See HPI   HISTORY: Past Medical, Surgical, Social, and Family History Reviewed & Updated per EMR.   Pertinent Historical Findings include:  Past Medical History:  Diagnosis Date  . ADHD (attention deficit hyperactivity disorder)   . ALLERGIC RHINITIS 06/26/2008  . ANEMIA-IRON DEFICIENCY 06/26/2008  . 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  . ANKLE PAIN, LEFT 06/26/2008  . BACK PAIN, LUMBAR 10/15/2007  . BIPOLAR DISORDER UNSPECIFIED 09/13/2007  . CERVICAL RADICULOPATHY, LEFT 06/26/2008  . Cervicalgia 10/15/2007  . Chronic pain syndrome 03/21/2011  . COMMON MIGRAINE 06/26/2008  . DIABETES MELLITUS, TYPE II 06/05/2007  . DYSPNEA 12/05/2007  . EAR PAIN, RIGHT 12/05/2007  . ELEVATED BP READING WITHOUT DX HYPERTENSION 07/29/2009  . FATIGUE 12/05/2007  . FIBROMYALGIA 06/26/2008  . GASTROENTERITIS, ACUTE 12/15/2008  . GERD 06/26/2008  . Headache(784.0) 09/13/2007  . HYPERLIPIDEMIA 09/13/2007  . JOINT EFFUSION, RIGHT KNEE 07/29/2009  . KNEE PAIN, LEFT 11/21/2010  . LUMBAR RADICULOPATHY, LEFT 06/26/2008  . OTHER DISEASE OF PHARYNX OR NASOPHARYNX 07/03/2008  . PERIPHERAL EDEMA 01/06/2009  . POLYARTHRITIS 11/21/2010  . SHOULDER PAIN, BILATERAL 07/03/2008  . SINUSITIS- ACUTE-NOS 07/29/2009  . TACHYCARDIA 12/05/2007  . THYROID NODULE, RIGHT 11/20/2008  . TREMOR 01/02/2008  . Vertigo     Past Surgical History:  Procedure Laterality Date  . back surgury     lumbar 2001  . CESAREAN SECTION     x 3  . thyroid fine needle aspiration  May 2008   showed non neoplastic goiter  . VAGINAL HYSTERECTOMY     fibroids    Family History  Problem Relation Age of Onset  .  Thyroid disease Mother   . Diabetes Mother   . Asthma Mother   . Hypertension Father   . Hyperlipidemia Father   . Diabetes Father   . Emphysema Other   . Coronary artery disease Other   . Cancer Other        pancreatic and breast cancer  . Cancer Other        lung and prostate cancer  . Breast cancer Neg Hx     Social History   Socioeconomic History  . Marital status: Significant Other    Spouse name: Not on file  . Number of children: 3  . Years of education: Not on file  . Highest education level: Not on file  Occupational History    Employer: UNEMPLOYED  Tobacco Use  . Smoking status: Never Smoker  . Smokeless tobacco: Never Used  Vaping Use  . Vaping Use: Never used  Substance and Sexual Activity  . Alcohol use: No  . Drug use: No  . Sexual activity: Not on file  Other Topics Concern  . Not on file  Social History Narrative  . Not on file   Social Determinants of Health   Financial Resource Strain: Not on file  Food Insecurity: Not on file  Transportation Needs: Not on file  Physical Activity: Not on file  Stress: Not on file  Social Connections: Not on file  Intimate Partner Violence: Not on file     PHYSICAL  EXAM:  VS: BP 138/88 (BP Location: Left Arm, Patient Position: Sitting, Cuff Size: Large)   Ht 5\' 4"  (1.626 m)   Wt 190 lb (86.2 kg)   BMI 32.61 kg/m  Physical Exam Gen: NAD, alert, cooperative with exam, well-appearing MSK:  Right ankle: Normal range of motion. Normal strength resistance. Tenderness palpation over the metatarsal heads. Neurovascularly intact  Limited ultrasound: Right ankle/foot:  No ankle effusion. Normal-appearing posterior tibialis. No changes of the acuities. Hypoechoic change within the posterior fat pad to suggest impingement  Summary: Posterior ankle impingement  Ultrasound and interpretation by Clearance Coots, MD    ASSESSMENT & PLAN:   Ankle impingement syndrome, right Has changes in the posterior  compartment to suggest impingement.  Does stand on her feet for long periods of time. -Counseled on home exercise therapy and supportive care. -Green sport insoles. -Heel lifts  Metatarsalgia of right foot Has tenderness at the metatarsal heads. -Green sport insoles with metatarsal pads

## 2021-03-15 ENCOUNTER — Ambulatory Visit: Payer: Medicaid Other | Admitting: Family Medicine

## 2021-03-17 ENCOUNTER — Emergency Department (HOSPITAL_BASED_OUTPATIENT_CLINIC_OR_DEPARTMENT_OTHER): Payer: Medicaid Other

## 2021-03-17 ENCOUNTER — Encounter (HOSPITAL_BASED_OUTPATIENT_CLINIC_OR_DEPARTMENT_OTHER): Payer: Self-pay

## 2021-03-17 ENCOUNTER — Emergency Department (HOSPITAL_BASED_OUTPATIENT_CLINIC_OR_DEPARTMENT_OTHER)
Admission: EM | Admit: 2021-03-17 | Discharge: 2021-03-17 | Disposition: A | Discharge status: Home / Self Care | Payer: Medicaid Other | Attending: Emergency Medicine | Admitting: Emergency Medicine

## 2021-03-17 ENCOUNTER — Ambulatory Visit (INDEPENDENT_AMBULATORY_CARE_PROVIDER_SITE_OTHER): Payer: Medicaid Other | Admitting: Family Medicine

## 2021-03-17 ENCOUNTER — Other Ambulatory Visit: Payer: Self-pay

## 2021-03-17 VITALS — BP 126/86 | Ht 64.0 in | Wt 205.0 lb

## 2021-03-17 DIAGNOSIS — E119 Type 2 diabetes mellitus without complications: Secondary | ICD-10-CM | POA: Diagnosis not present

## 2021-03-17 DIAGNOSIS — Z8616 Personal history of COVID-19: Secondary | ICD-10-CM | POA: Diagnosis not present

## 2021-03-17 DIAGNOSIS — M722 Plantar fascial fibromatosis: Secondary | ICD-10-CM

## 2021-03-17 DIAGNOSIS — R1030 Lower abdominal pain, unspecified: Secondary | ICD-10-CM

## 2021-03-17 DIAGNOSIS — N838 Other noninflammatory disorders of ovary, fallopian tube and broad ligament: Secondary | ICD-10-CM | POA: Diagnosis not present

## 2021-03-17 DIAGNOSIS — K5730 Diverticulosis of large intestine without perforation or abscess without bleeding: Secondary | ICD-10-CM | POA: Diagnosis not present

## 2021-03-17 DIAGNOSIS — N83202 Unspecified ovarian cyst, left side: Secondary | ICD-10-CM

## 2021-03-17 DIAGNOSIS — N83201 Unspecified ovarian cyst, right side: Secondary | ICD-10-CM | POA: Diagnosis not present

## 2021-03-17 DIAGNOSIS — N83292 Other ovarian cyst, left side: Secondary | ICD-10-CM | POA: Diagnosis not present

## 2021-03-17 DIAGNOSIS — K219 Gastro-esophageal reflux disease without esophagitis: Secondary | ICD-10-CM | POA: Insufficient documentation

## 2021-03-17 HISTORY — DX: Plantar fascial fibromatosis: M72.2

## 2021-03-17 LAB — URINALYSIS, MICROSCOPIC (REFLEX)

## 2021-03-17 LAB — URINALYSIS, ROUTINE W REFLEX MICROSCOPIC
Bilirubin Urine: NEGATIVE
Glucose, UA: >=500 mg/dL — AB
Hgb urine dipstick: NEGATIVE
Ketones, ur: NEGATIVE mg/dL
Leukocytes,Ua: NEGATIVE
Nitrite: NEGATIVE
Protein, ur: NEGATIVE mg/dL
Specific Gravity, Urine: 1.015 (ref 1.005–1.030)
pH: 6 (ref 5.0–8.0)

## 2021-03-17 MED ORDER — KETOROLAC TROMETHAMINE 30 MG/ML IJ SOLN
30.0000 mg | Freq: Once | INTRAMUSCULAR | Status: AC
  Administered 2021-03-17: 30 mg via INTRAMUSCULAR
  Filled 2021-03-17: qty 1

## 2021-03-17 MED ORDER — IBUPROFEN 800 MG PO TABS: 800.0000 mg | ORAL_TABLET | Freq: Three times a day (TID) | ORAL | 0 refills | Status: DC | PRN

## 2021-03-17 NOTE — ED Notes (Signed)
Pt transported to CT ?

## 2021-03-17 NOTE — ED Provider Notes (Signed)
Emergency Department Provider Note   I have reviewed the triage vital signs and the nursing notes.   HISTORY  Chief Complaint Flank Pain   HPI Shirley [name] is a 45 y.o. female with past medical history reviewed below presents to the emergency department with acute onset left flank pain starting yesterday.  Pain is intermittent but severe at times.  States it feels like a pressure that becomes gradually more intense and then relaxes.  She denies any dysuria, hesitancy, urgency.  She has had some mild frequency.  No gross hematuria.  Denies any vaginal bleeding or discharge.  Pain radiates through the left flank but no other location of the abdomen or chest.  No prior history of kidney stone or frequent urinary tract infections.   Past Medical History:  Diagnosis Date  . ADHD (attention deficit hyperactivity disorder)   . ALLERGIC RHINITIS 06/26/2008  . ANEMIA-IRON DEFICIENCY 06/26/2008  . 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  . ANKLE PAIN, LEFT 06/26/2008  . BACK PAIN, LUMBAR 10/15/2007  . BIPOLAR DISORDER UNSPECIFIED 09/13/2007  . CERVICAL RADICULOPATHY, LEFT 06/26/2008  . Cervicalgia 10/15/2007  . Chronic pain syndrome 03/21/2011  . COMMON MIGRAINE 06/26/2008  . DIABETES MELLITUS, TYPE II 06/05/2007  . DYSPNEA 12/05/2007  . EAR PAIN, RIGHT 12/05/2007  . ELEVATED BP READING WITHOUT DX HYPERTENSION 07/29/2009  . FATIGUE 12/05/2007  . FIBROMYALGIA 06/26/2008  . GASTROENTERITIS, ACUTE 12/15/2008  . GERD 06/26/2008  . Headache(784.0) 09/13/2007  . HYPERLIPIDEMIA 09/13/2007  . JOINT EFFUSION, RIGHT KNEE 07/29/2009  . KNEE PAIN, LEFT 11/21/2010  . LUMBAR RADICULOPATHY, LEFT 06/26/2008  . OTHER DISEASE OF PHARYNX OR NASOPHARYNX 07/03/2008  . PERIPHERAL EDEMA 01/06/2009  . POLYARTHRITIS 11/21/2010  . SHOULDER PAIN, BILATERAL 07/03/2008  . SINUSITIS- ACUTE-NOS 07/29/2009  . TACHYCARDIA 12/05/2007  . THYROID NODULE, RIGHT 11/20/2008  . TREMOR 01/02/2008  . Vertigo     Patient Active Problem List   Diagnosis Date  Noted  . Ankle impingement syndrome, right 03/01/2021  . Metatarsalgia of right foot 03/01/2021  . COVID-19 virus infection 11/24/2020  . ADHD (attention deficit hyperactivity disorder)   . Vertigo   . Toe injury, left, initial encounter 05/22/2019  . Right foot injury, initial encounter 02/08/2018  . Bilateral knee pain 01/14/2018  . MRSA (methicillin resistant staph aureus) urine culture positive on 07/06/16 07/06/2016  . Thoracic back pain 12/20/2015  . Low back pain radiating to right leg 12/01/2015  . Shoulder impingement syndrome, left 11/07/2013  . Encounter for Siana Panameno-term (current) use of high-risk medication 05/29/2011  . Chronic pain syndrome 03/21/2011  . POLYARTHRITIS 11/21/2010  . KNEE PAIN, LEFT 11/21/2010  . SINUSITIS- ACUTE-NOS 07/29/2009  . JOINT EFFUSION, RIGHT KNEE 07/29/2009  . ELEVATED BP READING WITHOUT DX HYPERTENSION 07/29/2009  . PERIPHERAL EDEMA 01/06/2009  . THYROID NODULE, RIGHT 11/20/2008  . SHOULDER PAIN, BILATERAL 07/03/2008  . GERD 06/26/2008  . CERVICAL RADICULOPATHY, LEFT 06/26/2008  . LUMBAR RADICULOPATHY, LEFT 06/26/2008  . Fibromyalgia 06/26/2008  . ANKLE PAIN, LEFT 06/26/2008  . ANEMIA-IRON DEFICIENCY 06/26/2008  . TACHYCARDIA 12/05/2007  . DYSPNEA 12/05/2007  . Neck pain 10/15/2007  . BACK PAIN, LUMBAR 10/15/2007  . Cervicalgia 10/15/2007  . Hyperlipidemia 09/13/2007  . BIPOLAR DISORDER UNSPECIFIED 09/13/2007  . Type 2 diabetes mellitus without complication, without Britania Shreeve-term current use of insulin ([place]) 06/05/2007    Past Surgical History:  Procedure Laterality Date  . back surgury     lumbar 2001  . CESAREAN SECTION     x 3  . thyroid fine  needle aspiration  May 2008   showed non neoplastic goiter  . VAGINAL HYSTERECTOMY     fibroids    Allergies Sulfa antibiotics, Gadolinium derivatives, Promethazine hcl, and Reglan [metoclopramide]  Family History  Problem Relation Age of Onset  . Thyroid disease Mother   . Diabetes  Mother   . Asthma Mother   . Hypertension Father   . Hyperlipidemia Father   . Diabetes Father   . Emphysema Other   . Coronary artery disease Other   . Cancer Other        pancreatic and breast cancer  . Cancer Other        lung and prostate cancer  . Breast cancer Neg Hx     Social History Social History   Tobacco Use  . Smoking status: Never Smoker  . Smokeless tobacco: Never Used  Vaping Use  . Vaping Use: Never used  Substance Use Topics  . Alcohol use: No  . Drug use: No    Review of Systems  Constitutional: No fever/chills Eyes: No visual changes. ENT: No sore throat. Cardiovascular: Denies chest pain. Respiratory: Denies shortness of breath. Gastrointestinal: Positive left flank/abdominal pain.  No nausea, no vomiting.  No diarrhea.  No constipation. Genitourinary: Negative for dysuria. Musculoskeletal: Negative for back pain. Skin: Negative for rash. Neurological: Negative for headaches, focal weakness or numbness.  10-point ROS otherwise negative.  ____________________________________________   PHYSICAL EXAM:  VITAL SIGNS: ED Triage Vitals  Enc Vitals Group     BP 03/17/21 0927 (!) 146/97     Pulse Rate 03/17/21 0927 93     Resp 03/17/21 0927 18     Temp 03/17/21 0927 98.2 F (36.8 C)     Temp Source 03/17/21 0927 Oral     SpO2 03/17/21 0927 100 %     Weight 03/17/21 0928 205 lb (93 kg)     Height 03/17/21 0928 5\' 4"  (1.626 m)   Constitutional: Alert and oriented. Well appearing and in no acute distress. Eyes: Conjunctivae are normal. Head: Atraumatic. Nose: No congestion/rhinnorhea. Mouth/Throat: Mucous membranes are moist.  Neck: No stridor.  Cardiovascular: Normal rate, regular rhythm. Good peripheral circulation. Grossly normal heart sounds.   Respiratory: Normal respiratory effort.  No retractions. Lungs CTAB. Gastrointestinal: Soft and nontender throughout. No rebound or guarding. No distention.  Musculoskeletal: No gross  deformities of extremities. Neurologic:  Normal speech and language.  Skin:  Skin is warm, dry and intact. No rash noted.   ____________________________________________   LABS (all labs ordered are listed, but only abnormal results are displayed)  Labs Reviewed  URINALYSIS, ROUTINE W REFLEX MICROSCOPIC - Abnormal; Notable for the following components:      Result Value   Glucose, UA >=500 (*)    All other components within normal limits  URINALYSIS, MICROSCOPIC (REFLEX) - Abnormal; Notable for the following components:   Bacteria, UA RARE (*)    All other components within normal limits   ____________________________________________  RADIOLOGY  CT Renal Stone Study  Result Date: 03/17/2021 CLINICAL DATA:  Left flank pain. EXAM: CT ABDOMEN AND PELVIS WITHOUT CONTRAST TECHNIQUE: Multidetector CT imaging of the abdomen and pelvis was performed following the standard protocol without IV contrast. COMPARISON:  CT abdomen 12/21/2006 FINDINGS: Lower chest: Lung bases are clear. Hepatobiliary: Normal appearance of the liver and gallbladder. No significant biliary dilatation. Pancreas: Unremarkable. No pancreatic ductal dilatation or surrounding inflammatory changes. Spleen: Normal in size without focal abnormality. Adrenals/Urinary Tract: Normal appearance of the adrenal glands. Normal appearance  of the urinary bladder. No kidney stones or hydronephrosis. Stomach/Bowel: Scattered colonic diverticula without acute inflammation. Normal appearance of the appendix. Normal appearance of stomach. No evidence for bowel obstruction. Vascular/Lymphatic: No significant vascular findings are present. No enlarged abdominal or pelvic lymph nodes. Reproductive: Uterus is surgically absent. Left ovary/adnexa is slightly prominent and cannot exclude a large follicle or small cyst in this area. Other: Negative for free fluid.  Negative for free air. Musculoskeletal: Severe disc space loss at the lumbosacral  junction. No evidence for an acute bone abnormality. IMPRESSION: 1. No acute abnormality in the abdomen or pelvis. Specifically, no evidence for kidney stones or hydronephrosis. 2. The left ovary/adnexa is slightly prominent but likely within normal limits. If this is an area of clinical concern, consider further characterization with a pelvic ultrasound. 3. Colonic diverticulosis without acute inflammation. Electronically Signed   By: Markus Daft M.D.   On: 03/17/2021 10:41   US PELVIC COMPLETE W TRANSVAGINAL AND TORSION R/O  Result Date: 03/17/2021 CLINICAL DATA:  Lower abdominal pain EXAM: TRANSABDOMINAL AND TRANSVAGINAL ULTRASOUND OF PELVIS DOPPLER ULTRASOUND OF OVARIES TECHNIQUE: Both transabdominal and transvaginal ultrasound examinations of the pelvis were performed. Transabdominal technique was performed for global imaging of the pelvis including uterus, ovaries, adnexal regions, and pelvic cul-de-sac. It was necessary to proceed with endovaginal exam following the transabdominal exam to visualize the ovaries. Color and duplex Doppler ultrasound was utilized to evaluate blood flow to the ovaries. COMPARISON:  05/13/2005 FINDINGS: Uterus Surgically absent Endometrium N/A Right ovary Measurements: 2.3 x 1.2 x 1.4 cm = volume: 2.1 mL. Normal morphology without mass. Internal blood flow present on color Doppler imaging. Left ovary Measurements: 3.2 x 2.5 x 3.5 cm = volume: 14.6 mL. Small simple cyst within LEFT ovary, 2.4 x 2.2 x 2.3 cm; no followup imaging recommended. Note: This recommendation does not apply to premenarchal patients or to those with increased risk (genetic, family history, elevated tumor markers or other high-risk factors) of ovarian cancer. Reference: Radiology 2019 Nov; 293(2):359-371. Pulsed Doppler evaluation of both ovaries demonstrates normal low-resistance arterial and venous waveforms. Other findings No free pelvic fluid.  No adnexal masses. IMPRESSION: Post hysterectomy. Simple  cyst LEFT ovary 2.4 cm greatest size; no follow-up imaging recommended, as above. No additional pelvic sonographic abnormalities identified. No evidence of ovarian torsion. Electronically Signed   By: Lavonia Dana M.D.   On: 03/17/2021 12:06    ____________________________________________   PROCEDURES  Procedure(s) performed:   Procedures  None  ____________________________________________   INITIAL IMPRESSION / ASSESSMENT AND PLAN / ED COURSE  Pertinent labs & imaging results that were available during my care of the patient were reviewed by me and considered in my medical decision making (see chart for details).   Patient presents to the emergency department with left flank pain which is been intermittent since yesterday.  Clinically seems consistent with ureteral stone CT renal performed showing no kidney stone or hydronephrosis.  No clear signs with the patient's symptoms.  There is note of some left ovary/adnexa prominence in comparison to the right.  Given his the side of patient's symptoms I will follow with a pelvic ultrasound. UA without evidence of infection. No tenderness on exam or vomiting to suspect pancreatitis, gastritis, or acute cholecystitis.   12:10 PM  UA without evidence of infection or blood.  Follow-up on the ultrasound the pelvis shows no evidence of torsion.  There is a simple cyst in the left ovary.  Question if this could be causing the  patient's symptoms.  She has an outpatient GYN as well as PCP.  She is feeling well here in the emergency department.  Plan for ibuprofen/Tylenol as needed for pain along with warm compress.  She will return with any new or suddenly worsening symptoms.  Plans to call her GYN and PCP teams for follow-up.  ____________________________________________  FINAL CLINICAL IMPRESSION(S) / ED DIAGNOSES  Final diagnoses:  Lower abdominal pain, unspecified  Left ovarian cyst     MEDICATIONS GIVEN DURING THIS VISIT:  Medications   ketorolac (TORADOL) 30 MG/ML injection 30 mg (30 mg Intramuscular Given 03/17/21 1024)     NEW OUTPATIENT MEDICATIONS STARTED DURING THIS VISIT:  New Prescriptions   IBUPROFEN (ADVIL) 800 MG TABLET    Take 1 tablet (800 mg total) by mouth every 8 (eight) hours as needed for moderate pain.    Note:  This document was prepared using Dragon voice recognition software and may include unintentional dictation errors.  Nanda Quinton, MD, [facility]    Margaux Engen, [facility], MD 03/17/21 1215

## 2021-03-17 NOTE — ED Triage Notes (Signed)
L flank pain since yesterday afternoon.  Pt denies any urinary sxs or hx of stones

## 2021-03-17 NOTE — ED Notes (Signed)
Pt. In ultrasound; unable to recheck VS at this time

## 2021-03-17 NOTE — Discharge Instructions (Signed)
You were seen in the ED today with left-sided abdominal pain.  We did a CT scan looking for possible kidney stones but this was normal.  The ultrasound showed a cyst on your left ovary which could be causing some of this pain.  You may take ibuprofen and Tylenol as needed and apply warm compress.  Please follow with your primary care doctor and GYN teams as an outpatient.  Return with any new or suddenly worsening symptoms.

## 2021-03-17 NOTE — Progress Notes (Signed)
Shirley [name] - 45 y.o. female MRN [id number]  Date of birth: [birth year]  SUBJECTIVE:  Including CC & ROS.  No chief complaint on file.   Shirley [name] is a 45 y.o. female that is following up for her bilateral foot pain.  She does get improvement with the insoles but still having pain at the base of each heel.  It is worse with the first few steps.   Review of Systems See HPI   HISTORY: Past Medical, Surgical, Social, and Family History Reviewed & Updated per EMR.   Pertinent Historical Findings include:  Past Medical History:  Diagnosis Date  . ADHD (attention deficit hyperactivity disorder)   . ALLERGIC RHINITIS 06/26/2008  . ANEMIA-IRON DEFICIENCY 06/26/2008  . 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  . ANKLE PAIN, LEFT 06/26/2008  . BACK PAIN, LUMBAR 10/15/2007  . BIPOLAR DISORDER UNSPECIFIED 09/13/2007  . CERVICAL RADICULOPATHY, LEFT 06/26/2008  . Cervicalgia 10/15/2007  . Chronic pain syndrome 03/21/2011  . COMMON MIGRAINE 06/26/2008  . DIABETES MELLITUS, TYPE II 06/05/2007  . DYSPNEA 12/05/2007  . EAR PAIN, RIGHT 12/05/2007  . ELEVATED BP READING WITHOUT DX HYPERTENSION 07/29/2009  . FATIGUE 12/05/2007  . FIBROMYALGIA 06/26/2008  . GASTROENTERITIS, ACUTE 12/15/2008  . GERD 06/26/2008  . Headache(784.0) 09/13/2007  . HYPERLIPIDEMIA 09/13/2007  . JOINT EFFUSION, RIGHT KNEE 07/29/2009  . KNEE PAIN, LEFT 11/21/2010  . LUMBAR RADICULOPATHY, LEFT 06/26/2008  . OTHER DISEASE OF PHARYNX OR NASOPHARYNX 07/03/2008  . PERIPHERAL EDEMA 01/06/2009  . POLYARTHRITIS 11/21/2010  . SHOULDER PAIN, BILATERAL 07/03/2008  . SINUSITIS- ACUTE-NOS 07/29/2009  . TACHYCARDIA 12/05/2007  . THYROID NODULE, RIGHT 11/20/2008  . TREMOR 01/02/2008  . Vertigo     Past Surgical History:  Procedure Laterality Date  . back surgury     lumbar 2001  . CESAREAN SECTION     x 3  . thyroid fine needle aspiration  May 2008   showed non neoplastic goiter  . VAGINAL HYSTERECTOMY     fibroids    Family History  Problem Relation Age  of Onset  . Thyroid disease Mother   . Diabetes Mother   . Asthma Mother   . Hypertension Father   . Hyperlipidemia Father   . Diabetes Father   . Emphysema Other   . Coronary artery disease Other   . Cancer Other        pancreatic and breast cancer  . Cancer Other        lung and prostate cancer  . Breast cancer Neg Hx     Social History   Socioeconomic History  . Marital status: Significant Other    Spouse name: Not on file  . Number of children: 3  . Years of education: Not on file  . Highest education level: Not on file  Occupational History    Employer: UNEMPLOYED  Tobacco Use  . Smoking status: Never Smoker  . Smokeless tobacco: Never Used  Vaping Use  . Vaping Use: Never used  Substance and Sexual Activity  . Alcohol use: No  . Drug use: No  . Sexual activity: Not on file  Other Topics Concern  . Not on file  Social History Narrative  . Not on file   Social Determinants of Health   Financial Resource Strain: Not on file  Food Insecurity: Not on file  Transportation Needs: Not on file  Physical Activity: Not on file  Stress: Not on file  Social Connections: Not on file  Intimate Partner Violence: Not on file  PHYSICAL EXAM:  VS: BP 126/86   Ht 5\' 4"  (1.626 m)   Wt 205 lb (93 kg)   BMI 35.19 kg/m  Physical Exam Gen: NAD, alert, cooperative with exam, well-appearing MSK:  Right and left heel: No swelling or ecchymosis. Tenderness palpation at the plantar aspect of the calcaneus. Neurovascular intact     ASSESSMENT & PLAN:   Plantar fasciitis, bilateral Symptoms seem most consistent with prior fasciitis at the current time.  Has got improvement with the insoles but still having pain at the base of the heel. -Counseled on home exercise therapy and supportive care. -Can consider bilateral injections.  We can send in Valium prior to the procedure.

## 2021-03-17 NOTE — Assessment & Plan Note (Signed)
Symptoms seem most consistent with prior fasciitis at the current time.  Has got improvement with the insoles but still having pain at the base of the heel. -Counseled on home exercise therapy and supportive care. -Can consider bilateral injections.  We can send in Valium prior to the procedure.

## 2021-03-18 ENCOUNTER — Telehealth: Payer: Self-pay

## 2021-03-18 NOTE — Telephone Encounter (Signed)
Transition Care Management Unsuccessful Follow-up Telephone Call  Date of discharge and from where:  03/17/2021 from [facility].  Attempts:  1st Attempt  Reason for unsuccessful TCM follow-up call:  Left voice message

## 2021-03-21 NOTE — Telephone Encounter (Signed)
Transition Care Management Unsuccessful Follow-up Telephone Call  Date of discharge and from where:  03/17/2021 from [facility]  Attempts:  2nd Attempt  Reason for unsuccessful TCM follow-up call:  Left voice message

## 2021-03-22 NOTE — Telephone Encounter (Signed)
Transition Care Management Unsuccessful Follow-up Telephone Call  Date of discharge and from where:  03/17/2021 from [facility]  Attempts:  3rd Attempt  Reason for unsuccessful TCM follow-up call:  Unable to reach patient

## 2021-03-23 ENCOUNTER — Ambulatory Visit: Payer: Medicaid Other | Admitting: Internal Medicine

## 2021-03-28 DIAGNOSIS — M722 Plantar fascial fibromatosis: Secondary | ICD-10-CM | POA: Diagnosis not present

## 2021-03-28 DIAGNOSIS — M6701 Short Achilles tendon (acquired), right ankle: Secondary | ICD-10-CM

## 2021-03-28 HISTORY — DX: Short Achilles tendon (acquired), right ankle: M67.01

## 2021-04-01 ENCOUNTER — Other Ambulatory Visit: Payer: Self-pay | Admitting: Family Medicine

## 2021-04-07 ENCOUNTER — Encounter: Payer: Self-pay | Admitting: Family Medicine

## 2021-04-07 ENCOUNTER — Ambulatory Visit (INDEPENDENT_AMBULATORY_CARE_PROVIDER_SITE_OTHER): Payer: Medicaid Other | Admitting: Family Medicine

## 2021-04-07 ENCOUNTER — Other Ambulatory Visit: Payer: Self-pay

## 2021-04-07 VITALS — BP 124/88 | Ht 64.0 in | Wt 205.0 lb

## 2021-04-07 DIAGNOSIS — M7661 Achilles tendinitis, right leg: Secondary | ICD-10-CM | POA: Insufficient documentation

## 2021-04-07 DIAGNOSIS — M722 Plantar fascial fibromatosis: Secondary | ICD-10-CM

## 2021-04-07 DIAGNOSIS — M797 Fibromyalgia: Secondary | ICD-10-CM | POA: Diagnosis not present

## 2021-04-07 HISTORY — DX: Achilles tendinitis, right leg: M76.61

## 2021-04-07 MED ORDER — DICLOFENAC SODIUM 1 % EX GEL: 4.0000 g | Freq: Four times a day (QID) | CUTANEOUS | 11 refills | Status: DC

## 2021-04-07 MED ORDER — GABAPENTIN 100 MG PO CAPS: 100.0000 mg | ORAL_CAPSULE | Freq: Three times a day (TID) | ORAL | 1 refills | Status: DC | PRN

## 2021-04-07 MED ORDER — CYCLOBENZAPRINE HCL 10 MG PO TABS: 5.0000 mg | ORAL_TABLET | Freq: Three times a day (TID) | ORAL | 1 refills | Status: DC | PRN

## 2021-04-07 NOTE — Progress Notes (Signed)
Shirley [name] - 45 y.o. female MRN [id number]  Date of birth: [birth year]  SUBJECTIVE:  Including CC & ROS.  No chief complaint on file.   Shirley [name] is a 45 y.o. female that is presenting with bilateral knee Planter fasciitis and right Achilles pain.  The pain in the heel is intermittent in nature.  Also endorsing Achilles pain on the right side.   Review of Systems See HPI   HISTORY: Past Medical, Surgical, Social, and Family History Reviewed & Updated per EMR.   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   ANKLE PAIN, LEFT 06/26/2008   BACK PAIN, LUMBAR 10/15/2007   BIPOLAR DISORDER UNSPECIFIED 09/13/2007   CERVICAL RADICULOPATHY, LEFT 06/26/2008   Cervicalgia 10/15/2007   Chronic pain syndrome 03/21/2011   COMMON MIGRAINE 06/26/2008   DIABETES MELLITUS, TYPE II 06/05/2007   DYSPNEA 12/05/2007   EAR PAIN, RIGHT 12/05/2007   ELEVATED BP READING WITHOUT DX HYPERTENSION 07/29/2009   FATIGUE 12/05/2007   FIBROMYALGIA 06/26/2008   GASTROENTERITIS, ACUTE 12/15/2008   GERD 06/26/2008   Headache(784.0) 09/13/2007   HYPERLIPIDEMIA 09/13/2007   JOINT EFFUSION, RIGHT KNEE 07/29/2009   KNEE PAIN, LEFT 11/21/2010   LUMBAR RADICULOPATHY, LEFT 06/26/2008   OTHER DISEASE OF PHARYNX OR NASOPHARYNX 07/03/2008   PERIPHERAL EDEMA 01/06/2009   POLYARTHRITIS 11/21/2010   SHOULDER PAIN, BILATERAL 07/03/2008   SINUSITIS- ACUTE-NOS 07/29/2009   TACHYCARDIA 12/05/2007   THYROID NODULE, RIGHT 11/20/2008   TREMOR 01/02/2008   Vertigo     Past Surgical History:  Procedure Laterality Date   back surgury     lumbar 2001   CESAREAN SECTION     x 3   thyroid fine needle aspiration  May 2008   showed non neoplastic goiter   VAGINAL HYSTERECTOMY     fibroids    Family History  Problem Relation Age of Onset   Thyroid disease Mother     Diabetes Mother    Asthma Mother    Hypertension Father    Hyperlipidemia Father    Diabetes Father    Emphysema Other    Coronary artery disease Other    Cancer Other        pancreatic and breast cancer   Cancer Other        lung and prostate cancer   Breast cancer Neg Hx     Social History   Socioeconomic History   Marital status: Significant Other    Spouse name: Not on file   Number of children: 3   Years of education: Not on file   Highest education level: Not on file  Occupational History    Employer: UNEMPLOYED  Tobacco Use   Smoking status: Never   Smokeless tobacco: Never  Vaping Use   Vaping Use: Never used  Substance and Sexual Activity   Alcohol use: No   Drug use: No   Sexual activity: Not on file  Other Topics Concern   Not on file  Social History Narrative   Not on file   Social Determinants of Health   Financial Resource Strain: Not on file  Food Insecurity: Not on file  Transportation Needs: Not on file  Physical Activity: Not on file  Stress: Not on file  Social Connections: Not on file  Intimate Partner Violence: Not on file     PHYSICAL EXAM:  VS:  BP 124/88 (BP Location: Left Arm, Patient Position: Sitting, Cuff Size: Large)   Ht 5\' 4"  (1.626 m)   Wt 205 lb (93 kg)   BMI 35.19 kg/m  Physical Exam Gen: NAD, alert, cooperative with exam, well-appearing    ASSESSMENT & PLAN:   Fibromyalgia Acute on chronic in nature. -Refilled gabapentin. -Refilled Flexeril.   Plantar fasciitis, bilateral Intermittent in nature.  Is doing well for the most part and is doing different activities than her regular job. -Counseled on home exercise therapy and supportive care. -Voltaren. -Could consider injection or physical therapy.  Achilles tendinitis of right lower extremity Acuity seems to be affected by the Planter fasciitis on inch lateral foot. -Counseled on home exercise therapy and supportive care. -Voltaren. -Counseled on the  left. -Could consider shockwave.

## 2021-04-07 NOTE — Assessment & Plan Note (Signed)
Intermittent in nature.  Is doing well for the most part and is doing different activities than her regular job. -Counseled on home exercise therapy and supportive care. -Voltaren. -Could consider injection or physical therapy.

## 2021-04-07 NOTE — Assessment & Plan Note (Addendum)
Acute on chronic in nature. -Refilled gabapentin. -Refilled Flexeril.

## 2021-04-07 NOTE — Patient Instructions (Signed)
Good to see you Please continue the stretching  Please try ice on the heel  Please try to wear a shoe with more of a heel   Please send me a message in MyChart with any questions or updates.  Please see me back in 4 weeks.   --Dr. Raeford Razor

## 2021-04-07 NOTE — Assessment & Plan Note (Signed)
Acuity seems to be affected by the Planter fasciitis on inch lateral foot. -Counseled on home exercise therapy and supportive care. -Voltaren. -Counseled on the left. -Could consider shockwave.

## 2021-04-08 ENCOUNTER — Ambulatory Visit: Payer: Medicaid Other | Admitting: Family Medicine

## 2021-04-28 ENCOUNTER — Other Ambulatory Visit: Payer: Self-pay

## 2021-04-28 ENCOUNTER — Encounter: Payer: Self-pay | Admitting: Family Medicine

## 2021-04-28 ENCOUNTER — Ambulatory Visit (INDEPENDENT_AMBULATORY_CARE_PROVIDER_SITE_OTHER): Payer: Medicaid Other | Admitting: Family Medicine

## 2021-04-28 VITALS — BP 146/84 | HR 100 | Temp 98.8°F | Wt 200.0 lb

## 2021-04-28 DIAGNOSIS — N952 Postmenopausal atrophic vaginitis: Secondary | ICD-10-CM

## 2021-04-28 MED ORDER — PROGESTERONE 200 MG PO CAPS: 200.0000 mg | ORAL_CAPSULE | Freq: Every day | ORAL | 3 refills | Status: DC

## 2021-04-28 MED ORDER — PREMARIN 0.625 MG/GM VA CREA: 1.0000 | TOPICAL_CREAM | Freq: Every day | VAGINAL | 12 refills | Status: DC

## 2021-04-28 NOTE — Progress Notes (Signed)
   Subjective:    Patient ID: Shirley [name], female    DOB: Feb 15, 1976, 45 y.o.   MRN: [id number]  HPI Patient seen for fissures at her introitus due to atrophy. She has run out of her medication and has started to have them again.    Review of Systems     Objective:   Physical Exam Vitals reviewed. Exam conducted with a chaperone present.  Constitutional:      Appearance: Normal appearance.  Cardiovascular:     Rate and Rhythm: Normal rate.  Neurological:     General: No focal deficit present.     Mental Status: She is alert.  Psychiatric:        Mood and Affect: Mood normal.        Behavior: Behavior normal.        Thought Content: Thought content normal.      Assessment & Plan:   1. Vaginal atrophy Restart HRT. Will give premarin for estrogen - hopefully the vaginal dosing will improve the atrophy. Will also give prometrium. F/u in 3 months

## 2021-05-05 ENCOUNTER — Ambulatory Visit: Payer: Medicaid Other | Admitting: Family Medicine

## 2021-05-05 NOTE — Progress Notes (Deleted)
Shirley [name] - 45 y.o. female MRN [id number]  Date of birth: 1976-04-29  SUBJECTIVE:  Including CC & ROS.  No chief complaint on file.   Shirley [name] is a 45 y.o. female that is  ***.  ***   Review of Systems See HPI   HISTORY: Past Medical, Surgical, Social, and Family History Reviewed & Updated per EMR.   Pertinent Historical Findings include:  Past Medical History:  Diagnosis Date  . ADHD (attention deficit hyperactivity disorder)   . ALLERGIC RHINITIS 06/26/2008  . ANEMIA-IRON DEFICIENCY 06/26/2008  . 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  . ANKLE PAIN, LEFT 06/26/2008  . BACK PAIN, LUMBAR 10/15/2007  . BIPOLAR DISORDER UNSPECIFIED 09/13/2007  . CERVICAL RADICULOPATHY, LEFT 06/26/2008  . Cervicalgia 10/15/2007  . Chronic pain syndrome 03/21/2011  . COMMON MIGRAINE 06/26/2008  . DIABETES MELLITUS, TYPE II 06/05/2007  . DYSPNEA 12/05/2007  . EAR PAIN, RIGHT 12/05/2007  . ELEVATED BP READING WITHOUT DX HYPERTENSION 07/29/2009  . FATIGUE 12/05/2007  . FIBROMYALGIA 06/26/2008  . GASTROENTERITIS, ACUTE 12/15/2008  . GERD 06/26/2008  . Headache(784.0) 09/13/2007  . HYPERLIPIDEMIA 09/13/2007  . JOINT EFFUSION, RIGHT KNEE 07/29/2009  . KNEE PAIN, LEFT 11/21/2010  . LUMBAR RADICULOPATHY, LEFT 06/26/2008  . OTHER DISEASE OF PHARYNX OR NASOPHARYNX 07/03/2008  . PERIPHERAL EDEMA 01/06/2009  . POLYARTHRITIS 11/21/2010  . SHOULDER PAIN, BILATERAL 07/03/2008  . SINUSITIS- ACUTE-NOS 07/29/2009  . TACHYCARDIA 12/05/2007  . THYROID NODULE, RIGHT 11/20/2008  . TREMOR 01/02/2008  . Vertigo     Past Surgical History:  Procedure Laterality Date  . back surgury     lumbar 2001  . CESAREAN SECTION     x 3  . thyroid fine needle aspiration  May 2008   showed non neoplastic goiter  . VAGINAL HYSTERECTOMY     fibroids    Family History  Problem Relation Age of Onset  . Thyroid disease Mother   . Diabetes Mother   . Asthma Mother   . Hypertension Father   . Hyperlipidemia Father   . Diabetes Father   .  Emphysema Other   . Coronary artery disease Other   . Cancer Other        pancreatic and breast cancer  . Cancer Other        lung and prostate cancer  . Breast cancer Neg Hx     Social History   Socioeconomic History  . Marital status: Significant Other    Spouse name: Not on file  . Number of children: 3  . Years of education: Not on file  . Highest education level: Not on file  Occupational History    Employer: UNEMPLOYED  Tobacco Use  . Smoking status: Never  . Smokeless tobacco: Never  Vaping Use  . Vaping Use: Never used  Substance and Sexual Activity  . Alcohol use: No  . Drug use: No  . Sexual activity: Not on file  Other Topics Concern  . Not on file  Social History Narrative  . Not on file   Social Determinants of Health   Financial Resource Strain: Not on file  Food Insecurity: Not on file  Transportation Needs: Not on file  Physical Activity: Not on file  Stress: Not on file  Social Connections: Not on file  Intimate Partner Violence: Not on file     PHYSICAL EXAM:  VS: There were no vitals taken for this visit. Physical Exam Gen: NAD, alert, cooperative with exam, well-appearing MSK:  ***  ASSESSMENT & PLAN:   No problem-specific Assessment & Plan notes found for this encounter.

## 2021-05-20 ENCOUNTER — Encounter: Payer: Self-pay | Admitting: Family Medicine

## 2021-05-20 ENCOUNTER — Other Ambulatory Visit: Payer: Self-pay

## 2021-05-20 ENCOUNTER — Ambulatory Visit (INDEPENDENT_AMBULATORY_CARE_PROVIDER_SITE_OTHER): Payer: Medicaid Other | Admitting: Family Medicine

## 2021-05-20 VITALS — BP 122/88 | Ht 64.0 in | Wt 200.0 lb

## 2021-05-20 DIAGNOSIS — M7542 Impingement syndrome of left shoulder: Secondary | ICD-10-CM | POA: Diagnosis not present

## 2021-05-20 DIAGNOSIS — M722 Plantar fascial fibromatosis: Secondary | ICD-10-CM

## 2021-05-20 DIAGNOSIS — M7661 Achilles tendinitis, right leg: Secondary | ICD-10-CM

## 2021-05-20 MED ORDER — MELOXICAM 15 MG PO TABS: ORAL_TABLET | ORAL | 3 refills | Status: DC

## 2021-05-20 NOTE — Progress Notes (Signed)
Shirley [name] - 45 y.o. female MRN [id number]  Date of birth: 01-16-1976  SUBJECTIVE:  Including CC & ROS.  No chief complaint on file.   Shirley [name] is a 45 y.o. female that is presenting with right heel pain and right Achilles pain and left shoulder pain.  Foot and ankle and heel pain all seem to be worse the more she is on her feet.  The shoulder pain is acute on chronic in nature.  She has received therapy in the past and injections and medications.    Review of Systems See HPI   HISTORY: Past Medical, Surgical, Social, and Family History Reviewed & Updated per EMR.   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   ANKLE PAIN, LEFT 06/26/2008   BACK PAIN, LUMBAR 10/15/2007   BIPOLAR DISORDER UNSPECIFIED 09/13/2007   CERVICAL RADICULOPATHY, LEFT 06/26/2008   Cervicalgia 10/15/2007   Chronic pain syndrome 03/21/2011   COMMON MIGRAINE 06/26/2008   DIABETES MELLITUS, TYPE II 06/05/2007   DYSPNEA 12/05/2007   EAR PAIN, RIGHT 12/05/2007   ELEVATED BP READING WITHOUT DX HYPERTENSION 07/29/2009   FATIGUE 12/05/2007   FIBROMYALGIA 06/26/2008   GASTROENTERITIS, ACUTE 12/15/2008   GERD 06/26/2008   Headache(784.0) 09/13/2007   HYPERLIPIDEMIA 09/13/2007   JOINT EFFUSION, RIGHT KNEE 07/29/2009   KNEE PAIN, LEFT 11/21/2010   LUMBAR RADICULOPATHY, LEFT 06/26/2008   OTHER DISEASE OF PHARYNX OR NASOPHARYNX 07/03/2008   PERIPHERAL EDEMA 01/06/2009   POLYARTHRITIS 11/21/2010   SHOULDER PAIN, BILATERAL 07/03/2008   SINUSITIS- ACUTE-NOS 07/29/2009   TACHYCARDIA 12/05/2007   THYROID NODULE, RIGHT 11/20/2008   TREMOR 01/02/2008   Vertigo     Past Surgical History:  Procedure Laterality Date   back surgury     lumbar 2001   CESAREAN SECTION     x 3   thyroid fine needle aspiration  May 2008   showed non neoplastic goiter    VAGINAL HYSTERECTOMY     fibroids    Family History  Problem Relation Age of Onset   Thyroid disease Mother    Diabetes Mother    Asthma Mother    Hypertension Father    Hyperlipidemia Father    Diabetes Father    Emphysema Other    Coronary artery disease Other    Cancer Other        pancreatic and breast cancer   Cancer Other        lung and prostate cancer   Breast cancer Neg Hx     Social History   Socioeconomic History   Marital status: Significant Other    Spouse name: Not on file   Number of children: 3   Years of education: Not on file   Highest education level: Not on file  Occupational History    Employer: UNEMPLOYED  Tobacco Use   Smoking status: Never   Smokeless tobacco: Never  Vaping Use   Vaping Use: Never used  Substance and Sexual Activity   Alcohol use: No   Drug use: No   Sexual activity: Not on file  Other Topics Concern   Not on file  Social History Narrative   Not on file   Social Determinants of Health   Financial Resource Strain: Not on file  Food Insecurity: Not on file  Transportation Needs: Not on file  Physical Activity: Not on file  Stress: Not on file  Social Connections: Not on file  Intimate Partner Violence: Not on file     PHYSICAL EXAM:  VS: BP 122/88 (BP Location: Left Arm, Patient Position: Sitting, Cuff Size: Large)   Ht '5\' 4"'$  (1.626 m)   Wt 200 lb (90.7 kg)   BMI 34.33 kg/m  Physical Exam Gen: NAD, alert, cooperative with exam, well-appearing      ASSESSMENT & PLAN:   Shoulder impingement syndrome, left Acute on chronic in nature.  Exacerbated with repetitive activities. -Counseled on home exercise therapy and supportive care. -Could consider shockwave, injection or physical therapy.  Achilles tendinitis of right lower extremity Still having some issue at the Achilles.  Has tried heel lifts with some improvement. -Counseled on home exercise therapy and supportive care. -Can consider shockwave therapy  or injection  Plantar fasciitis, bilateral Right is worse than the left. -Counseled on home exercise therapy and supportive care. -Mobic. -Could consider shockwave or injection.

## 2021-05-20 NOTE — Assessment & Plan Note (Signed)
Right is worse than the left. -Counseled on home exercise therapy and supportive care. -Mobic. -Could consider shockwave or injection.

## 2021-05-20 NOTE — Patient Instructions (Signed)
Good to see you Please try the mobic  Please try the exercises   Please send me a message in MyChart with any questions or updates.  Please see [name] back in 4 weeks  --Dr. Raeford Razor

## 2021-05-20 NOTE — Assessment & Plan Note (Signed)
Acute on chronic in nature.  Exacerbated with repetitive activities. -Counseled on home exercise therapy and supportive care. -Could consider shockwave, injection or physical therapy.

## 2021-05-20 NOTE — Assessment & Plan Note (Signed)
Still having some issue at the Achilles.  Has tried heel lifts with some improvement. -Counseled on home exercise therapy and supportive care. -Can consider shockwave therapy or injection

## 2021-05-25 ENCOUNTER — Telehealth: Payer: Self-pay | Admitting: Family Medicine

## 2021-05-25 NOTE — Telephone Encounter (Signed)
Pt cld states at last OV 7/29 provider suggest a  (boot) for Rt foot pain --pt declined but now says she wants to try the boot wonders if Rx needed or do we have boot here in office for her to pick up.  --please advise.  --Forwarding message to provider for pt's request.  -glh

## 2021-06-08 ENCOUNTER — Other Ambulatory Visit: Payer: Self-pay | Admitting: *Deleted

## 2021-06-08 DIAGNOSIS — M7661 Achilles tendinitis, right leg: Secondary | ICD-10-CM

## 2021-06-08 DIAGNOSIS — M722 Plantar fascial fibromatosis: Secondary | ICD-10-CM

## 2021-06-08 MED ORDER — MELOXICAM 15 MG PO TABS: ORAL_TABLET | ORAL | 3 refills | Status: DC

## 2021-06-13 ENCOUNTER — Ambulatory Visit: Payer: Medicaid Other | Admitting: Internal Medicine

## 2021-06-17 ENCOUNTER — Other Ambulatory Visit: Payer: Self-pay

## 2021-06-17 ENCOUNTER — Encounter (HOSPITAL_BASED_OUTPATIENT_CLINIC_OR_DEPARTMENT_OTHER): Payer: Self-pay

## 2021-06-17 DIAGNOSIS — E119 Type 2 diabetes mellitus without complications: Secondary | ICD-10-CM | POA: Diagnosis not present

## 2021-06-17 DIAGNOSIS — J00 Acute nasopharyngitis [common cold]: Secondary | ICD-10-CM | POA: Insufficient documentation

## 2021-06-17 DIAGNOSIS — Z8616 Personal history of COVID-19: Secondary | ICD-10-CM | POA: Insufficient documentation

## 2021-06-17 DIAGNOSIS — Z20822 Contact with and (suspected) exposure to covid-19: Secondary | ICD-10-CM | POA: Insufficient documentation

## 2021-06-17 DIAGNOSIS — J111 Influenza due to unidentified influenza virus with other respiratory manifestations: Secondary | ICD-10-CM | POA: Diagnosis not present

## 2021-06-17 DIAGNOSIS — Z79899 Other long term (current) drug therapy: Secondary | ICD-10-CM | POA: Insufficient documentation

## 2021-06-17 DIAGNOSIS — J3489 Other specified disorders of nose and nasal sinuses: Secondary | ICD-10-CM | POA: Diagnosis not present

## 2021-06-17 DIAGNOSIS — I10 Essential (primary) hypertension: Secondary | ICD-10-CM | POA: Diagnosis not present

## 2021-06-17 DIAGNOSIS — R059 Cough, unspecified: Secondary | ICD-10-CM | POA: Diagnosis not present

## 2021-06-17 NOTE — ED Triage Notes (Signed)
Pt c/o flu like sx x 5 days-NAD-steady gait

## 2021-06-18 ENCOUNTER — Emergency Department (HOSPITAL_BASED_OUTPATIENT_CLINIC_OR_DEPARTMENT_OTHER): Payer: Medicaid Other

## 2021-06-18 ENCOUNTER — Emergency Department (HOSPITAL_BASED_OUTPATIENT_CLINIC_OR_DEPARTMENT_OTHER)
Admission: EM | Admit: 2021-06-18 | Discharge: 2021-06-18 | Disposition: A | Discharge status: Home / Self Care | Payer: Medicaid Other | Attending: Emergency Medicine | Admitting: Emergency Medicine

## 2021-06-18 DIAGNOSIS — J00 Acute nasopharyngitis [common cold]: Secondary | ICD-10-CM

## 2021-06-18 DIAGNOSIS — J111 Influenza due to unidentified influenza virus with other respiratory manifestations: Secondary | ICD-10-CM | POA: Diagnosis not present

## 2021-06-18 DIAGNOSIS — R059 Cough, unspecified: Secondary | ICD-10-CM

## 2021-06-18 LAB — RESP PANEL BY RT-PCR (FLU A&B, COVID) ARPGX2
Influenza A by PCR: NEGATIVE
Influenza B by PCR: NEGATIVE
SARS Coronavirus 2 by RT PCR: NEGATIVE

## 2021-06-18 MED ORDER — BENZONATATE 100 MG PO CAPS: 100.0000 mg | ORAL_CAPSULE | Freq: Three times a day (TID) | ORAL | 0 refills | Status: DC

## 2021-06-18 MED ORDER — GUAIFENESIN-CODEINE 100-10 MG/5ML PO SOLN: 10.0000 mL | Freq: Four times a day (QID) | ORAL | 0 refills | Status: AC | PRN

## 2021-06-18 MED ORDER — ACETAMINOPHEN 500 MG PO TABS
1000.0000 mg | ORAL_TABLET | Freq: Once | ORAL | Status: AC
  Administered 2021-06-18: 1000 mg via ORAL
  Filled 2021-06-18: qty 2

## 2021-06-18 NOTE — ED Provider Notes (Signed)
[facility]ER[facility] DEPART[facility] Provider Note  CSN: [id number] Arrival date & time: 06/17/21 2254  Chief Complaint(s) Cough  HPI Shirley [name] is a 45 y.o. female    Cough Cough characteristics:  Productive Severity:  Moderate Onset quality:  Gradual Duration:  5 days Timing:  Constant Progression:  Waxing and waning Chronicity:  New Context: upper respiratory infection   Context: not sick contacts   Relieved by:  Nothing Worsened by:  Lying down Ineffective treatments:  Decongestant, rest and cough suppressants Associated symptoms: fever, myalgias, rhinorrhea, sinus congestion and sore throat    Past Medical History 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   ANKLE PAIN, LEFT 06/26/2008   BACK PAIN, LUMBAR 10/15/2007   BIPOLAR DISORDER UNSPECIFIED 09/13/2007   CERVICAL RADICULOPATHY, LEFT 06/26/2008   Cervicalgia 10/15/2007   Chronic pain syndrome 03/21/2011   COMMON MIGRAINE 06/26/2008   DIABETES MELLITUS, TYPE II 06/05/2007   DYSPNEA 12/05/2007   EAR PAIN, RIGHT 12/05/2007   ELEVATED BP READING WITHOUT DX HYPERTENSION 07/29/2009   FATIGUE 12/05/2007   FIBROMYALGIA 06/26/2008   GASTROENTERITIS, ACUTE 12/15/2008   GERD 06/26/2008   Headache(784.0) 09/13/2007   HYPERLIPIDEMIA 09/13/2007   JOINT EFFUSION, RIGHT KNEE 07/29/2009   KNEE PAIN, LEFT 11/21/2010   LUMBAR RADICULOPATHY, LEFT 06/26/2008   OTHER DISEASE OF PHARYNX OR NASOPHARYNX 07/03/2008   PERIPHERAL EDEMA 01/06/2009   POLYARTHRITIS 11/21/2010   SHOULDER PAIN, BILATERAL 07/03/2008   SINUSITIS- ACUTE-NOS 07/29/2009   TACHYCARDIA 12/05/2007   THYROID NODULE, RIGHT 11/20/2008   TREMOR 01/02/2008   Vertigo    Patient Active Problem List   Diagnosis Date Noted   Achilles tendinitis of right lower extremity 04/07/2021   Plantar fasciitis, bilateral 03/17/2021   Ankle impingement  syndrome, right 03/01/2021   Metatarsalgia of right foot 03/01/2021   COVID-19 virus infection 11/24/2020   ADHD (attention deficit hyperactivity disorder)    Toe injury, left, initial encounter 05/22/2019   MRSA (methicillin resistant staph aureus) urine culture positive on 07/06/16 07/06/2016   Thoracic back pain 12/20/2015   Low back pain radiating to right leg 12/01/2015   Shoulder impingement syndrome, left 11/07/2013   Encounter for long-term (current) use of high-risk medication 05/29/2011   Chronic pain syndrome 03/21/2011   SINUSITIS- ACUTE-NOS 07/29/2009   JOINT EFFUSION, RIGHT KNEE 07/29/2009   ELEVATED BP READING WITHOUT DX HYPERTENSION 07/29/2009   THYROID NODULE, RIGHT 11/20/2008   GERD 06/26/2008   CERVICAL RADICULOPATHY, LEFT 06/26/2008   LUMBAR RADICULOPATHY, LEFT 06/26/2008   Fibromyalgia 06/26/2008   ANEMIA-IRON DEFICIENCY 06/26/2008   DYSPNEA 12/05/2007   Neck pain 10/15/2007   Cervicalgia 10/15/2007   Hyperlipidemia 09/13/2007   BIPOLAR DISORDER UNSPECIFIED 09/13/2007   Type 2 diabetes mellitus without complication, without long-term current use of insulin ([place]) 06/05/2007   Home Medication(s) Prior to Admission medications   Medication Sig Start Date End Date Taking? Authorizing Provider  benzonatate (TESSALON) 100 MG capsule Take 1 capsule (100 mg total) by mouth every 8 (eight) hours. 06/18/21  Yes Caitland Porchia, Grayce Sessions, MD  guaiFENesin-codeine 100-10 MG/5ML syrup Take 10 mLs by mouth every 6 (six) hours as needed for up to 5 days for cough. 06/18/21 06/23/21 Yes Hanalei Glace, Grayce Sessions, MD  conjugated estrogens (PREMARIN) vaginal cream Place 1 Applicatorful vaginally daily. 04/28/21   Truett Mainland, DO  cyclobenzaprine (FLEXERIL) 10 MG tablet Take 0.5 tablets (5 mg total)  by mouth 3 (three) times daily as needed. 04/07/21   Rosemarie Ax, MD  diclofenac Sodium (VOLTAREN) 1 % GEL Apply 4 g topically 4 (four) times daily. To affected joint. 04/07/21   Rosemarie Ax, MD  fluticasone (FLONASE) 50 MCG/ACT nasal spray Place 2 sprays into both nostrils daily for 14 days. 11/09/20 11/23/20  Tedd Sias, PA  meloxicam (MOBIC) 15 MG tablet One tab PO qAM with breakfast for 2 weeks, then daily prn pain. 06/08/21   Rosemarie Ax, MD  metoprolol tartrate (LOPRESSOR) 25 MG tablet Take 1 tablet (25 mg total) by mouth 2 (two) times daily. 10/04/20 01/02/21  Richardo Priest, MD  progesterone (PROMETRIUM) 200 MG capsule Take 1 capsule (200 mg total) by mouth daily. 04/28/21   Truett Mainland, DO  rosuvastatin (CRESTOR) 20 MG tablet Take 1 tablet (20 mg total) by mouth daily. 11/24/20 02/22/21  Richardo Priest, MD                                                                                                                                    Past Surgical History Past Surgical History:  Procedure Laterality Date   back surgury     lumbar 2001   CESAREAN SECTION     x 3   thyroid fine needle aspiration  May 2008   showed non neoplastic goiter   VAGINAL HYSTERECTOMY     fibroids   Family History Family History  Problem Relation Age of Onset   Thyroid disease Mother    Diabetes Mother    Asthma Mother    Hypertension Father    Hyperlipidemia Father    Diabetes Father    Emphysema Other    Coronary artery disease Other    Cancer Other        pancreatic and breast cancer   Cancer Other        lung and prostate cancer   Breast cancer Neg Hx     Social History Social History   Tobacco Use   Smoking status: Never   Smokeless tobacco: Never  Vaping Use   Vaping Use: Never used  Substance Use Topics   Alcohol use: No   Drug use: No   Allergies Sulfa antibiotics, Gadolinium derivatives, Promethazine hcl, and Reglan [metoclopramide]  Review of Systems Review of Systems  Constitutional:  Positive for fever.  HENT:  Positive for rhinorrhea and sore throat.   Respiratory:  Positive for cough.   Musculoskeletal:  Positive for myalgias.  All  other systems are reviewed and are negative for acute change except as noted in the HPI  Physical Exam Vital Signs  I have reviewed the triage vital signs BP (!) 151/80 (BP Location: Right Arm)   Pulse 100   Temp 98.6 F (37 C) (Oral)   Resp 16   Ht '5\' 4"'$  (1.626 m)   Wt 93.9 kg   SpO2  97%   BMI 35.53 kg/m   Physical Exam Vitals reviewed.  Constitutional:      General: She is not in acute distress.    Appearance: She is well-developed. She is not diaphoretic.  HENT:     Head: Normocephalic and atraumatic.     Nose: Congestion and rhinorrhea present.     Right Sinus: Maxillary sinus tenderness present.     Left Sinus: Maxillary sinus tenderness present.     Mouth/Throat:     Pharynx: No pharyngeal swelling.     Tonsils: No tonsillar exudate.  Eyes:     General: No scleral icterus.       Right eye: No discharge.        Left eye: No discharge.     Conjunctiva/sclera: Conjunctivae normal.     Pupils: Pupils are equal, round, and reactive to light.  Cardiovascular:     Rate and Rhythm: Normal rate and regular rhythm.     Heart sounds: No murmur heard.   No friction rub. No gallop.  Pulmonary:     Effort: Pulmonary effort is normal. No respiratory distress.     Breath sounds: Normal breath sounds. No stridor. No rales.  Abdominal:     General: There is no distension.     Palpations: Abdomen is soft.     Tenderness: There is no abdominal tenderness.  Musculoskeletal:        General: No tenderness.     Cervical back: Normal range of motion and neck supple.  Skin:    General: Skin is warm and dry.     Findings: No erythema or rash.  Neurological:     Mental Status: She is alert and oriented to person, place, and time.    ED Results and Treatments Labs (all labs ordered are listed, but only abnormal results are displayed) Labs Reviewed  RESP PANEL BY RT-PCR (FLU A&B, COVID) [id number]                                                                                                                          EKG  EKG Interpretation  Date/Time:    Ventricular Rate:    PR Interval:    QRS Duration:   QT Interval:    QTC Calculation:   R Axis:     Text Interpretation:         Radiology DG Chest 2 View  Result Date: 06/18/2021 CLINICAL DATA:  45 year old female with flu-like symptoms for 5 days. EXAM: CHEST - 2 VIEW COMPARISON:  Chest radiographs 11/24/2020 and earlier. FINDINGS: Chronically somewhat low lung volumes. Mediastinal contours remain normal. Visualized tracheal air column is within normal limits. Both lungs appear clear. No pneumothorax or pleural effusion. No acute osseous abnormality identified. Negative visible bowel gas pattern. IMPRESSION: Negative.  No cardiopulmonary abnormality. Electronically Signed   By: Genevie Ann M.D.   On: 06/18/2021 04:34    Pertinent labs & imaging results that were available during my care of  the patient were reviewed by me and considered in my medical decision making (see MDM for details).  Medications Ordered in ED Medications  acetaminophen (TYLENOL) tablet 1,000 mg (1,000 mg Oral Given 06/18/21 0418)                                                                                                                                     Procedures Procedures  (including critical care time)  Medical Decision Making / ED Course I have reviewed the nursing notes for this encounter and the patient's prior records (if available in EHR or on provided paperwork).  SURA ISLAS was evaluated in Emergency Department on 06/18/2021 for the symptoms described in the history of present illness. She was evaluated in the context of the global COVID-19 pandemic, which necessitated consideration that the patient might be at risk for infection with the SARS-CoV-2 virus that causes COVID-19. Institutional protocols and algorithms that pertain to the evaluation of patients at risk for COVID-19 are in a state of rapid change based on  information released by regulatory bodies including the CDC and federal and state organizations. These policies and algorithms were followed during the patient's care in the ED.     Patient presents with viral symptoms for 5 days.  Adequate oral hydration. Rest of history as above.  Patient appears well. No signs of toxicity, patient is interactive. No hypoxia, tachypnea or other signs of respiratory distress. No sign of clinical dehydration. Rest of exam as above.  Chest x-ray without evidence of pneumonia.  Most consistent with viral illness   No evidence suggestive of pharyngitis, AOM, PNA.  COVID-19/influenza negative  Discussed symptomatic treatment with the patient and they will follow closely with their PCP.   Pertinent labs & imaging results that were available during my care of the patient were reviewed by me and considered in my medical decision making:    Final Clinical Impression(s) / ED Diagnoses Final diagnoses:  Cough  Acute nasopharyngitis   The patient appears reasonably screened and/or stabilized for discharge and I doubt any other medical condition or other [facility] requiring further screening, evaluation, or treatment in the ED at this time prior to discharge. Safe for discharge with strict return precautions.  Disposition: Discharge  Condition: Good  I have discussed the results, Dx and Tx plan with the patient/family who expressed understanding and agree(s) with the plan. Discharge instructions discussed at length. The patient/family was given strict return precautions who verbalized understanding of the instructions. No further questions at time of discharge.    ED Discharge Orders          Ordered    benzonatate (TESSALON) 100 MG capsule  Every 8 hours        06/18/21 0400    guaiFENesin-codeine 100-10 MG/5ML syrup  Every 6 hours PRN        06/18/21 0420  [facility] narcotic database reviewed and no active prescriptions  noted.   Follow Up: [facility] [place] [id number] [phone]  Call  in 5-7 days, if symptoms do not improve or  worsen     This chart was dictated using voice recognition software.  Despite best efforts to proofread,  errors can occur which can change the documentation meaning.    Fatima Blank, MD 06/18/21 [phone]

## 2021-06-18 NOTE — ED Notes (Signed)
Pt c/o cough, fever, headache, head congestion x 5 days

## 2021-06-20 ENCOUNTER — Telehealth: Payer: Self-pay | Admitting: *Deleted

## 2021-06-20 NOTE — Telephone Encounter (Signed)
Transition Care Management Unsuccessful Follow-up Telephone Call  Date of discharge and from where:  06/18/2021 - [facility]  Attempts:  1st Attempt  Reason for unsuccessful TCM follow-up call:  Voice mail full

## 2021-06-21 NOTE — Telephone Encounter (Signed)
Transition Care Management Unsuccessful Follow-up Telephone Call  Date of discharge and from where:  06/18/2021 - [facility]  Attempts:  2nd Attempt  Reason for unsuccessful TCM follow-up call:  Voice mail full

## 2021-06-22 NOTE — Telephone Encounter (Signed)
Transition Care Management Unsuccessful Follow-up Telephone Call  Date of discharge and from where:  06/18/2021 - [facility]  Attempts:  3rd Attempt  Reason for unsuccessful TCM follow-up call:  Voice mail full

## 2021-07-07 ENCOUNTER — Telehealth: Payer: Self-pay | Admitting: Cardiology

## 2021-07-07 NOTE — Telephone Encounter (Signed)
   Patient c/o Palpitations:  High priority if patient c/o lightheadedness, shortness of breath, or chest pain  How long have you had palpitations/irregular HR/ Afib? Are you having the symptoms now? Yes   Are you currently experiencing lightheadedness, SOB or CP? SOB  Do you have a history of afib (atrial fibrillation) or irregular heart rhythm? No  Have you checked your BP or HR? (document readings if available): none   Are you experiencing any other symptoms? Pt said she been feeling her heart racing, it started this after at her work, she said she also felt a little sob. She wants to see Dr. Bettina Gavia tomorrow

## 2021-07-07 NOTE — Telephone Encounter (Signed)
Spoke to patient just now and she let me know that she can not come to the office to see Dr. Agustin Cree tomorrow at 9 or 11 am. I told her the next available with Dr. Bettina Gavia is on 07/19/2021 at 3 pm and she states that this will work out great for her. I told her in the mean time to contact her PCP and get in to see them if possible. She states that she will do so.    Encouraged patient to call back with any questions or concerns.

## 2021-07-08 ENCOUNTER — Ambulatory Visit: Payer: Medicaid Other | Admitting: Cardiology

## 2021-07-10 ENCOUNTER — Encounter (HOSPITAL_BASED_OUTPATIENT_CLINIC_OR_DEPARTMENT_OTHER): Payer: Self-pay | Admitting: Pharmacy Technician

## 2021-07-10 ENCOUNTER — Other Ambulatory Visit: Payer: Self-pay

## 2021-07-10 ENCOUNTER — Emergency Department (HOSPITAL_BASED_OUTPATIENT_CLINIC_OR_DEPARTMENT_OTHER): Payer: Medicaid Other

## 2021-07-10 ENCOUNTER — Emergency Department (HOSPITAL_BASED_OUTPATIENT_CLINIC_OR_DEPARTMENT_OTHER)
Admission: EM | Admit: 2021-07-10 | Discharge: 2021-07-10 | Disposition: A | Discharge status: Home / Self Care | Payer: Medicaid Other | Attending: Emergency Medicine | Admitting: Emergency Medicine

## 2021-07-10 DIAGNOSIS — I10 Essential (primary) hypertension: Secondary | ICD-10-CM | POA: Insufficient documentation

## 2021-07-10 DIAGNOSIS — R0602 Shortness of breath: Secondary | ICD-10-CM | POA: Diagnosis not present

## 2021-07-10 DIAGNOSIS — R002 Palpitations: Secondary | ICD-10-CM

## 2021-07-10 DIAGNOSIS — R112 Nausea with vomiting, unspecified: Secondary | ICD-10-CM | POA: Insufficient documentation

## 2021-07-10 DIAGNOSIS — Z8616 Personal history of COVID-19: Secondary | ICD-10-CM | POA: Insufficient documentation

## 2021-07-10 DIAGNOSIS — Z79899 Other long term (current) drug therapy: Secondary | ICD-10-CM | POA: Insufficient documentation

## 2021-07-10 DIAGNOSIS — E119 Type 2 diabetes mellitus without complications: Secondary | ICD-10-CM | POA: Diagnosis not present

## 2021-07-10 LAB — CBC
HCT: 37.8 % (ref 36.0–46.0)
Hemoglobin: 12.4 g/dL (ref 12.0–15.0)
MCH: 27.6 pg (ref 26.0–34.0)
MCHC: 32.8 g/dL (ref 30.0–36.0)
MCV: 84.2 fL (ref 80.0–100.0)
Platelets: 411 10*3/uL — ABNORMAL HIGH (ref 150–400)
RBC: 4.49 MIL/uL (ref 3.87–5.11)
RDW: 13.7 % (ref 11.5–15.5)
WBC: 6.8 10*3/uL (ref 4.0–10.5)
nRBC: 0 % (ref 0.0–0.2)

## 2021-07-10 LAB — COMPREHENSIVE METABOLIC PANEL
ALT: 36 U/L (ref 0–44)
AST: 47 U/L — ABNORMAL HIGH (ref 15–41)
Albumin: 3.6 g/dL (ref 3.5–5.0)
Alkaline Phosphatase: 125 U/L (ref 38–126)
Anion gap: 5 (ref 5–15)
BUN: 8 mg/dL (ref 6–20)
CO2: 25 mmol/L (ref 22–32)
Calcium: 9 mg/dL (ref 8.9–10.3)
Chloride: 105 mmol/L (ref 98–111)
Creatinine, Ser: 0.58 mg/dL (ref 0.44–1.00)
GFR, Estimated: >60 mL/min (ref >60)
Glucose, Bld: 276 mg/dL — ABNORMAL HIGH (ref 70–99)
Potassium: 4.1 mmol/L (ref 3.5–5.1)
Sodium: 135 mmol/L (ref 135–145)
Total Bilirubin: 0.2 mg/dL — ABNORMAL LOW (ref 0.3–1.2)
Total Protein: 7.2 g/dL (ref 6.5–8.1)

## 2021-07-10 LAB — D-DIMER, QUANTITATIVE: D-Dimer, Quant: 0.63 ug/mL-FEU — ABNORMAL HIGH (ref 0.00–0.50)

## 2021-07-10 MED ORDER — IOHEXOL 350 MG/ML SOLN
100.0000 mL | Freq: Once | INTRAVENOUS | Status: AC | PRN
  Administered 2021-07-10: 100 mL via INTRAVENOUS

## 2021-07-10 MED ORDER — ONDANSETRON 4 MG PO TBDP
4.0000 mg | ORAL_TABLET | Freq: Once | ORAL | Status: AC
  Administered 2021-07-10: 4 mg via ORAL
  Filled 2021-07-10: qty 1

## 2021-07-10 MED ORDER — ACETAMINOPHEN 325 MG PO TABS
650.0000 mg | ORAL_TABLET | Freq: Once | ORAL | Status: AC
  Administered 2021-07-10: 650 mg via ORAL
  Filled 2021-07-10: qty 2

## 2021-07-10 NOTE — Discharge Instructions (Signed)
Please follow-up with cardiology for continued management of your symptoms. Also, please see your primary care provider to help get your diabetes better controlled. I think this will greatly improve how you are feeling. Return if symptoms worsen.

## 2021-07-10 NOTE — ED Provider Notes (Signed)
[facility] Provider Note   CSN: [id number] Arrival date & time: 07/10/21  0912     History Chief Complaint  Patient presents with   Palpitations    Shirley [name] is a 45 y.o. female.  Patient with history of diabetes presents today with palpitations.  States that same began Thursday when she was working at an [employer].  She then noticed that her legs were swollen later that evening.  Also endorses feeling nauseous with 1 episode of nonbloody and nonbilious vomiting last night.  Of note, 3 weeks ago she felt a pop in her left calf and has had pain and tenderness in the same since.  Denies recent long travel, exogenous estrogen use, or history of malignancy.  States that she still feels that her heart is racing, and has associated shortness of breath with this feeling.  She has not missed any doses of her metoprolol.  The history is provided by the patient. No language interpreter was used.  Palpitations Associated symptoms: nausea, shortness of breath and vomiting   Associated symptoms: no chest pain, no cough, no dizziness, no numbness and no weakness       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   ANKLE PAIN, LEFT 06/26/2008   BACK PAIN, LUMBAR 10/15/2007   BIPOLAR DISORDER UNSPECIFIED 09/13/2007   CERVICAL RADICULOPATHY, LEFT 06/26/2008   Cervicalgia 10/15/2007   Chronic pain syndrome 03/21/2011   COMMON MIGRAINE 06/26/2008   DIABETES MELLITUS, TYPE II 06/05/2007   DYSPNEA 12/05/2007   EAR PAIN, RIGHT 12/05/2007   ELEVATED BP READING WITHOUT DX HYPERTENSION 07/29/2009   FATIGUE 12/05/2007   FIBROMYALGIA 06/26/2008   GASTROENTERITIS, ACUTE 12/15/2008   GERD 06/26/2008   Headache(784.0) 09/13/2007   HYPERLIPIDEMIA 09/13/2007   JOINT EFFUSION, RIGHT KNEE 07/29/2009   KNEE PAIN, LEFT 11/21/2010    LUMBAR RADICULOPATHY, LEFT 06/26/2008   OTHER DISEASE OF PHARYNX OR NASOPHARYNX 07/03/2008   PERIPHERAL EDEMA 01/06/2009   POLYARTHRITIS 11/21/2010   SHOULDER PAIN, BILATERAL 07/03/2008   SINUSITIS- ACUTE-NOS 07/29/2009   TACHYCARDIA 12/05/2007   THYROID NODULE, RIGHT 11/20/2008   TREMOR 01/02/2008   Vertigo     Patient Active Problem List   Diagnosis Date Noted   Achilles tendinitis of right lower extremity 04/07/2021   Plantar fasciitis, bilateral 03/17/2021   Ankle impingement syndrome, right 03/01/2021   Metatarsalgia of right foot 03/01/2021   COVID-19 virus infection 11/24/2020   ADHD (attention deficit hyperactivity disorder)    Toe injury, left, initial encounter 05/22/2019   MRSA (methicillin resistant staph aureus) urine culture positive on 07/06/16 07/06/2016   Thoracic back pain 12/20/2015   Low back pain radiating to right leg 12/01/2015   Shoulder impingement syndrome, left 11/07/2013   Encounter for long-term (current) use of high-risk medication 05/29/2011   Chronic pain syndrome 03/21/2011   SINUSITIS- ACUTE-NOS 07/29/2009   JOINT EFFUSION, RIGHT KNEE 07/29/2009   ELEVATED BP READING WITHOUT DX HYPERTENSION 07/29/2009   THYROID NODULE, RIGHT 11/20/2008   GERD 06/26/2008   CERVICAL RADICULOPATHY, LEFT 06/26/2008   LUMBAR RADICULOPATHY, LEFT 06/26/2008   Fibromyalgia 06/26/2008   ANEMIA-IRON DEFICIENCY 06/26/2008   DYSPNEA 12/05/2007   Neck pain 10/15/2007   Cervicalgia 10/15/2007   Hyperlipidemia 09/13/2007   BIPOLAR DISORDER UNSPECIFIED 09/13/2007   Type 2 diabetes mellitus without complication, without long-term current use of insulin ([place]) 06/05/2007  Past Surgical History:  Procedure Laterality Date   back surgury     lumbar 2001   CESAREAN SECTION     x 3   thyroid fine needle aspiration  May 2008   showed non neoplastic goiter   VAGINAL HYSTERECTOMY     fibroids     OB History     Gravida  3   Para  3   Term  2   Preterm  1   AB       Living  3      SAB      IAB      Ectopic      Multiple      Live Births  3           Family History  Problem Relation Age of Onset   Thyroid disease Mother    Diabetes Mother    Asthma Mother    Hypertension Father    Hyperlipidemia Father    Diabetes Father    Emphysema Other    Coronary artery disease Other    Cancer Other        pancreatic and breast cancer   Cancer Other        lung and prostate cancer   Breast cancer Neg Hx     Social History   Tobacco Use   Smoking status: Never   Smokeless tobacco: Never  Vaping Use   Vaping Use: Never used  Substance Use Topics   Alcohol use: No   Drug use: No    Home Medications Prior to Admission medications   Medication Sig Start Date End Date Taking? Authorizing Provider  benzonatate (TESSALON) 100 MG capsule Take 1 capsule (100 mg total) by mouth every 8 (eight) hours. 06/18/21   Fatima Blank, MD  conjugated estrogens (PREMARIN) vaginal cream Place 1 Applicatorful vaginally daily. 04/28/21   Truett Mainland, DO  cyclobenzaprine (FLEXERIL) 10 MG tablet Take 0.5 tablets (5 mg total) by mouth 3 (three) times daily as needed. 04/07/21   Rosemarie Ax, MD  diclofenac Sodium (VOLTAREN) 1 % GEL Apply 4 g topically 4 (four) times daily. To affected joint. 04/07/21   Rosemarie Ax, MD  fluticasone (FLONASE) 50 MCG/ACT nasal spray Place 2 sprays into both nostrils daily for 14 days. 11/09/20 11/23/20  Tedd Sias, PA  meloxicam (MOBIC) 15 MG tablet One tab PO qAM with breakfast for 2 weeks, then daily prn pain. 06/08/21   Rosemarie Ax, MD  metoprolol tartrate (LOPRESSOR) 25 MG tablet Take 1 tablet (25 mg total) by mouth 2 (two) times daily. 10/04/20 01/02/21  Richardo Priest, MD  progesterone (PROMETRIUM) 200 MG capsule Take 1 capsule (200 mg total) by mouth daily. 04/28/21   Truett Mainland, DO  rosuvastatin (CRESTOR) 20 MG tablet Take 1 tablet (20 mg total) by mouth daily. 11/24/20 02/22/21  Richardo Priest, MD    Allergies    Sulfa antibiotics, Gadolinium derivatives, Promethazine hcl, and Reglan [metoclopramide]  Review of Systems   Review of Systems  Constitutional:  Negative for chills, fatigue and fever.  HENT:  Negative for congestion, postnasal drip, rhinorrhea, sore throat, trouble swallowing and voice change.   Respiratory:  Positive for shortness of breath. Negative for cough.   Cardiovascular:  Positive for palpitations and leg swelling. Negative for chest pain.  Gastrointestinal:  Positive for nausea and vomiting. Negative for abdominal distention, abdominal pain, constipation and diarrhea.  Neurological:  Negative for dizziness, tremors,  seizures, syncope, facial asymmetry, speech difficulty, weakness, light-headedness, numbness and headaches.  Psychiatric/Behavioral:  Negative for confusion and decreased concentration.   All other systems reviewed and are negative.  Physical Exam Updated Vital Signs BP (!) 183/103   Pulse 95   Temp 98 F (36.7 C)   Resp 20   Ht '5\' 4"'$  (1.626 m)   Wt 95.3 kg   SpO2 100%   BMI 36.05 kg/m   Physical Exam Vitals and nursing note reviewed.  Constitutional:      General: She is not in acute distress.    Appearance: Normal appearance. She is not ill-appearing, toxic-appearing or diaphoretic.  HENT:     Head: Normocephalic and atraumatic.     Nose: Nose normal.     Mouth/Throat:     Mouth: Mucous membranes are moist.     Pharynx: Oropharynx is clear.  Eyes:     General: No scleral icterus.    Extraocular Movements: Extraocular movements intact.     Conjunctiva/sclera: Conjunctivae normal.     Pupils: Pupils are equal, round, and reactive to light.  Cardiovascular:     Rate and Rhythm: Normal rate and regular rhythm.     Pulses: Normal pulses.     Heart sounds: Normal heart sounds. No murmur heard. Pulmonary:     Effort: Pulmonary effort is normal. No respiratory distress.     Breath sounds: Normal breath sounds. No stridor. No  wheezing, rhonchi or rales.  Chest:     Chest wall: No tenderness.  Abdominal:     General: Abdomen is flat. Bowel sounds are normal. There is no distension.     Palpations: Abdomen is soft. There is no mass.     Tenderness: There is no abdominal tenderness. There is no guarding or rebound.     Hernia: No hernia is present.  Musculoskeletal:        General: Normal range of motion.     Cervical back: Normal range of motion and neck supple.     Right lower leg: No edema.     Left lower leg: No edema.     Comments: Tenderness noted to posterior left calf.  No warmth, swelling, or erythema noted.  Skin:    General: Skin is warm and dry.  Neurological:     General: No focal deficit present.     Mental Status: She is alert.     Motor: No weakness.  Psychiatric:        Behavior: Behavior normal.    ED Results / Procedures / Treatments   Labs (all labs ordered are listed, but only abnormal results are displayed) Labs Reviewed  CBC  COMPREHENSIVE METABOLIC PANEL  D-DIMER, QUANTITATIVE    EKG EKG Interpretation  Date/Time:  Sunday July 10 2021 09:31:25 EDT Ventricular Rate:  82 PR Interval:  168 QRS Duration: 139 QT Interval:  402 QTC Calculation: 470 R Axis:   97 Text Interpretation: Sinus rhythm Right bundle branch block No significant change since last tracing Confirmed by Calvert Cantor [phone]) on 07/10/2021 9:33:45 AM  Radiology CT Angio Chest PE W and/or Wo Contrast  Result Date: 07/10/2021 CLINICAL DATA:  PE suspected, palpitations EXAM: CT ANGIOGRAPHY CHEST WITH CONTRAST TECHNIQUE: Multidetector CT imaging of the chest was performed using the standard protocol during bolus administration of intravenous contrast. Multiplanar CT image reconstructions and MIPs were obtained to evaluate the vascular anatomy. CONTRAST:  143m OMNIPAQUE IOHEXOL 350 MG/ML SOLN COMPARISON:  07/19/2020 FINDINGS: Cardiovascular: Satisfactory opacification of the pulmonary arteries  to the  segmental level. No evidence of pulmonary embolism. Normal heart size. No pericardial effusion. Mediastinum/Nodes: No enlarged mediastinal, hilar, or axillary lymph nodes. Thyroid gland, trachea, and esophagus demonstrate no significant findings. Lungs/Pleura: Lungs are clear.  Trace bilateral pleural effusions. Upper Abdomen: No acute abnormality. Musculoskeletal: No chest wall abnormality. No acute or significant osseous findings. Review of the MIP images confirms the above findings. IMPRESSION: 1. Negative examination for pulmonary embolism. 2. Trace bilateral pleural effusions. Electronically Signed   By: Eddie Candle M.D.   On: 07/10/2021 12:28    Procedures Procedures   Medications Ordered in ED Medications  ondansetron (ZOFRAN-ODT) disintegrating tablet 4 mg (4 mg Oral Given 07/10/21 1118)  iohexol (OMNIPAQUE) 350 MG/ML injection 100 mL (100 mLs Intravenous Contrast Given 07/10/21 1159)  acetaminophen (TYLENOL) tablet 650 mg (650 mg Oral Given 07/10/21 1403)  ondansetron (ZOFRAN-ODT) disintegrating tablet 4 mg (4 mg Oral Given 07/10/21 1404)    ED Course  I have reviewed the triage vital signs and the nursing notes.  Pertinent labs & imaging results that were available during my care of the patient were reviewed by me and considered in my medical decision making (see chart for details).    MDM Rules/Calculators/A&P                         Patient presents today with several complaints. 3 weeks ago, she had a left lower leg injury followed by acute onset shortness of breath and palpitations this Thursday. Was worked up for PE rule out. D-dimer was elevated which warranted CT PE which was negative for embolus.  Labs also unremarkable for leukocytosis or anemia.  Of note, patient's blood glucose is elevated at 276, however it seems that this is baseline for her.  No anion gap or other evidence of DKA/HHS at this time. Upon discussing this with the patient, she states she is working to get set up  with endocrinology for further management of her poorly controlled high blood sugars.   Patient states that she still feels like her heart is racing at this time, however heart rate with pulse ox and cardiac monitoring reveals pulse in the 80s throughout hospital stay.  EKG reveals right bundle branch block which is her baseline.  No concern for ACS at this time.  I suspect that patient's symptoms are likely due to combination of anxiety and uncontrolled diabetes.  I have educated the patient on the damaging effects of long-term uncontrolled diabetes, I discussed the importance of getting her into see endocrinology ASAP for further management of this.  Patient states understanding, and is amenable with plan of discharge.  She has been informed of red flag symptoms that would prompt immediate return.   Findings and plan of care discussed with supervising physician Dr. Karle Starch who is in agreement.     Final Clinical Impression(s) / ED Diagnoses Final diagnoses:  Palpitations    Rx / DC Orders ED Discharge Orders     None     An After Visit Summary was printed and given to the patient.    Nestor Lewandowsky 07/10/21 1503    Truddie Hidden, MD 07/11/21 [phone]

## 2021-07-10 NOTE — ED Triage Notes (Signed)
Pt here with reports of having intermittent palpitations since Thursday. Pt also complains of swelling to her bil feet and some back pain. States 3 weeks ago she felt something "pop" In her calf. Pt also with an episode of emesis last night.

## 2021-07-10 NOTE — ED Notes (Signed)
Patient Alert and oriented to baseline. Stable and ambulatory to baseline. Patient verbalized understanding of the discharge instructions.  Patient belongings were taken by the patient.   

## 2021-07-11 ENCOUNTER — Encounter: Payer: Self-pay | Admitting: Family Medicine

## 2021-07-11 ENCOUNTER — Ambulatory Visit (INDEPENDENT_AMBULATORY_CARE_PROVIDER_SITE_OTHER): Payer: Medicaid Other | Admitting: Family Medicine

## 2021-07-11 ENCOUNTER — Telehealth: Payer: Self-pay

## 2021-07-11 VITALS — Ht 64.0 in | Wt 210.0 lb

## 2021-07-11 DIAGNOSIS — M722 Plantar fascial fibromatosis: Secondary | ICD-10-CM | POA: Diagnosis not present

## 2021-07-11 NOTE — Telephone Encounter (Signed)
Transition Care Management Unsuccessful Follow-up Telephone Call  Date of discharge and from where:  07/10/2021-[facility]   Attempts:  1st Attempt  Reason for unsuccessful TCM follow-up call:  Left voice message

## 2021-07-11 NOTE — Progress Notes (Signed)
Shirley [name] - 45 y.o. female MRN [id number]  Date of birth: September 08, 1976  SUBJECTIVE:  Including CC & ROS.  No chief complaint on file.   Shirley [name] is a 46 y.o. female that is presenting with bilateral foot pain.  She feels cramps in her feet intermittently.  Seems to be worse when she is on her feet.    Review of Systems See HPI   HISTORY: Past Medical, Surgical, Social, and Family History Reviewed & Updated per EMR.   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   ANKLE PAIN, LEFT 06/26/2008   BACK PAIN, LUMBAR 10/15/2007   BIPOLAR DISORDER UNSPECIFIED 09/13/2007   CERVICAL RADICULOPATHY, LEFT 06/26/2008   Cervicalgia 10/15/2007   Chronic pain syndrome 03/21/2011   COMMON MIGRAINE 06/26/2008   DIABETES MELLITUS, TYPE II 06/05/2007   DYSPNEA 12/05/2007   EAR PAIN, RIGHT 12/05/2007   ELEVATED BP READING WITHOUT DX HYPERTENSION 07/29/2009   FATIGUE 12/05/2007   FIBROMYALGIA 06/26/2008   GASTROENTERITIS, ACUTE 12/15/2008   GERD 06/26/2008   Headache(784.0) 09/13/2007   HYPERLIPIDEMIA 09/13/2007   JOINT EFFUSION, RIGHT KNEE 07/29/2009   KNEE PAIN, LEFT 11/21/2010   LUMBAR RADICULOPATHY, LEFT 06/26/2008   OTHER DISEASE OF PHARYNX OR NASOPHARYNX 07/03/2008   PERIPHERAL EDEMA 01/06/2009   POLYARTHRITIS 11/21/2010   SHOULDER PAIN, BILATERAL 07/03/2008   SINUSITIS- ACUTE-NOS 07/29/2009   TACHYCARDIA 12/05/2007   THYROID NODULE, RIGHT 11/20/2008   TREMOR 01/02/2008   Vertigo     Past Surgical History:  Procedure Laterality Date   back surgury     lumbar 2001   CESAREAN SECTION     x 3   thyroid fine needle aspiration  May 2008   showed non neoplastic goiter   VAGINAL HYSTERECTOMY     fibroids    Family History  Problem Relation Age of Onset   Thyroid disease Mother    Diabetes Mother    Asthma Mother     Hypertension Father    Hyperlipidemia Father    Diabetes Father    Emphysema Other    Coronary artery disease Other    Cancer Other        pancreatic and breast cancer   Cancer Other        lung and prostate cancer   Breast cancer Neg Hx     Social History   Socioeconomic History   Marital status: Significant Other    Spouse name: Not on file   Number of children: 3   Years of education: Not on file   Highest education level: Not on file  Occupational History    Employer: UNEMPLOYED  Tobacco Use   Smoking status: Never   Smokeless tobacco: Never  Vaping Use   Vaping Use: Never used  Substance and Sexual Activity   Alcohol use: No   Drug use: No   Sexual activity: Not on file  Other Topics Concern   Not on file  Social History Narrative   Not on file   Social Determinants of Health   Financial Resource Strain: Not on file  Food Insecurity: Not on file  Transportation Needs: Not on file  Physical Activity: Not on file  Stress: Not on file  Social Connections: Not on file  Intimate Partner Violence: Not on file     PHYSICAL EXAM:  VS: Ht '5\' 4"'$  (1.626  m)   Wt 210 lb (95.3 kg)   BMI 36.05 kg/m  Physical Exam Gen: NAD, alert, cooperative with exam, well-appearing      ASSESSMENT & PLAN:   Plantar fasciitis, bilateral Having cramping and pain in the feet and the legs.  Possibility associated with the [place] fasciitis versus having changes in the ferritin or magnesium.  Could have a neuropathy component with her diabetes. -Counseled on home exercise therapy and supportive care. -Green sport insoles. -Could consider lab testing or shockwave.

## 2021-07-11 NOTE — Patient Instructions (Signed)
Good to see you Please try the exercises  If the foot cramps continue we may need to do blood work   Please send me a message in [place] with any questions or updates.  Please see me back in 4 weeks.   --Dr. Raeford Razor

## 2021-07-12 ENCOUNTER — Ambulatory Visit (INDEPENDENT_AMBULATORY_CARE_PROVIDER_SITE_OTHER): Payer: Medicaid Other | Admitting: Medical

## 2021-07-12 ENCOUNTER — Other Ambulatory Visit: Payer: Self-pay

## 2021-07-12 ENCOUNTER — Ambulatory Visit (HOSPITAL_BASED_OUTPATIENT_CLINIC_OR_DEPARTMENT_OTHER)
Admission: RE | Admit: 2021-07-12 | Discharge: 2021-07-12 | Disposition: A | Discharge status: Home / Self Care | Payer: Medicaid Other | Source: Ambulatory Visit | Attending: Medical | Admitting: Medical

## 2021-07-12 VITALS — BP 152/82 | HR 98 | Temp 98.4°F | Resp 12 | Ht 64.0 in | Wt 199.0 lb

## 2021-07-12 DIAGNOSIS — E119 Type 2 diabetes mellitus without complications: Secondary | ICD-10-CM | POA: Diagnosis not present

## 2021-07-12 DIAGNOSIS — R1013 Epigastric pain: Secondary | ICD-10-CM

## 2021-07-12 DIAGNOSIS — M79605 Pain in left leg: Secondary | ICD-10-CM

## 2021-07-12 DIAGNOSIS — R002 Palpitations: Secondary | ICD-10-CM | POA: Diagnosis not present

## 2021-07-12 DIAGNOSIS — R079 Chest pain, unspecified: Secondary | ICD-10-CM | POA: Diagnosis not present

## 2021-07-12 DIAGNOSIS — R110 Nausea: Secondary | ICD-10-CM | POA: Diagnosis not present

## 2021-07-12 DIAGNOSIS — M79662 Pain in left lower leg: Secondary | ICD-10-CM | POA: Diagnosis not present

## 2021-07-12 LAB — TROPONIN I (HIGH SENSITIVITY): High Sens Troponin I: 7 ng/L (ref 2–17)

## 2021-07-12 LAB — COMPREHENSIVE METABOLIC PANEL
ALT: 22 U/L (ref 0–35)
AST: 16 U/L (ref 0–37)
Albumin: 3.7 g/dL (ref 3.5–5.2)
Alkaline Phosphatase: 80 U/L (ref 39–117)
BUN: 10 mg/dL (ref 6–23)
CO2: 26 mEq/L (ref 19–32)
Calcium: 9.3 mg/dL (ref 8.4–10.5)
Chloride: 102 mEq/L (ref 96–112)
Creatinine, Ser: 0.67 mg/dL (ref 0.40–1.20)
GFR: 104.27 mL/min (ref >60.00)
Glucose, Bld: 182 mg/dL — ABNORMAL HIGH (ref 70–99)
Potassium: 4.3 mEq/L (ref 3.5–5.1)
Sodium: 135 mEq/L (ref 135–145)
Total Bilirubin: 0.4 mg/dL (ref 0.2–1.2)
Total Protein: 7.6 g/dL (ref 6.0–8.3)

## 2021-07-12 LAB — LIPASE: Lipase: 21 U/L (ref 11.0–59.0)

## 2021-07-12 LAB — HEMOGLOBIN A1C: Hgb A1c MFr Bld: 11.1 % — ABNORMAL HIGH (ref 4.6–6.5)

## 2021-07-12 MED ORDER — FAMOTIDINE 20 MG PO TABS: 20.0000 mg | ORAL_TABLET | Freq: Two times a day (BID) | ORAL | 0 refills | Status: DC

## 2021-07-12 MED ORDER — ONDANSETRON 4 MG PO TBDP: 4.0000 mg | ORAL_TABLET | Freq: Three times a day (TID) | ORAL | 0 refills | Status: DC | PRN

## 2021-07-12 NOTE — Assessment & Plan Note (Signed)
Having cramping and pain in the feet and the legs.  Possibility associated with the Planter fasciitis versus having changes in the ferritin or magnesium.  Could have a neuropathy component with her diabetes. -Counseled on home exercise therapy and supportive care. -Green sport insoles. -Could consider lab testing or shockwave.

## 2021-07-12 NOTE — Patient Instructions (Addendum)
Follow up from ED. Since then.  Recent palpitations and brief atypical chest discomfort lasting only seconds.  Important to note no chest pain during interview or earlier today.  Last time she had brief transient pain was on Sunday.  EKG today shows no change prior to EKG done in the emergency department.  Shows sinus rhythm with right bundle branch block.  This is not a new finding.  Patient is on beta-blocker.  Advised to continue.  For caution sake and getting 1 stat troponin.  Second set not indicated based on no pain presently.  However if you do have recurrent chest pain or associated type cardiac signs/symptoms then be seen in emergency department.  Keep appointment with cardiologist in 1 week.  Left lower extremity pain recently.  Calf to popliteal area.  CT chest done in the emergency department showed no PE but no left lower extremity ultrasound was done.  Please go downstairs to get scheduled for the study.  I would like for you to get the study today.  Type 2 diabetes with last A1c 12.5 in December 2012.  Continue Januvia and placing new endocrinology referral.  Getting G4W and metabolic panel today.  Recent epigastric pain and nausea with eating.  Prescribing famotidine and Zofran.  Considering possibility of GERD, H. pylori and gastroparesis.  Please keep appointment with GI MD tomorrow.  History of hyperlipidemia.  Continue Crestor.  Getting lipid panel today.  Give me an update if having intermittent low-level palpitation type sensation.  In that event would call Dr. Joya Gaskins office or send it to her office communication to see if cardiology might be able to see you sooner.  You might need Holter.  Possible stress test or other studies.  Follow-up in 10 days or sooner if needed.

## 2021-07-12 NOTE — Progress Notes (Signed)
Subjective:    Patient ID: Shirley [name], female    DOB: [birth year], 45 y.o.   MRN: [id number]  HPI  Pt in for follow up.  Pt states she has had recent palpitations. She was evaluated in the ED. Since ED evaluation she has attempted to see cardiologist. She has appointment with Dr. Bettina Gavia sept 27, 2022.   Below are ED notes.  Arrival date & time: 07/10/21  0912     History    Chief Complaint  Patient presents with   Palpitations      "Shirley [name] is a 45 y.o. female.   Patient with history of diabetes presents today with palpitations.  States that same began Thursday when she was working at an [employer].  She then noticed that her legs were swollen later that evening.  Also endorses feeling nauseous with 1 episode of nonbloody and nonbilious vomiting last night.  Of note, 3 weeks ago she felt a pop in her left calf and has had pain and tenderness in the same since.  Denies recent long travel, exogenous estrogen use, or history of malignancy.  States that she still feels that her heart is racing, and has associated shortness of breath with this feeling.  She has not missed any doses of her metoprolol.   The history is provided by the patient. No language interpreter was used.  Palpitations Associated symptoms: nausea, shortness of breath and vomiting   Associated symptoms: no chest pain, no cough, no dizziness, no numbness and no weakness.   Ros in ED.  Review of Systems  Constitutional:  Negative for chills, fatigue and fever.  HENT:  Negative for congestion, postnasal drip, rhinorrhea, sore throat, trouble swallowing and voice change.   Respiratory:  Positive for shortness of breath. Negative for cough.   Cardiovascular:  Positive for palpitations and leg swelling. Negative for chest pain.  Gastrointestinal:  Positive for nausea and vomiting. Negative for abdominal distention, abdominal pain, constipation and diarrhea.  Neurological:  Negative  for dizziness, tremors, seizures, syncope, facial asymmetry, speech difficulty, weakness, light-headedness, numbness and headaches.  Psychiatric/Behavioral:  Negative for confusion and decreased concentration.   All other systems reviewed and are negative."  Bp was 183/103.  Lower ext exam Tenderness noted to posterior left calf.  No warmth, swelling, or erythema noted.    "A/P in ED.  Patient presents today with several complaints. 3 weeks ago, she had a left lower leg injury followed by acute onset shortness of breath and palpitations this Thursday. Was worked up for PE rule out. D-dimer was elevated which warranted CT PE which was negative for embolus.  Labs also unremarkable for leukocytosis or anemia.  Of note, patient's blood glucose is elevated at 276, however it seems that this is baseline for her.  No anion gap or other evidence of DKA/HHS at this time. Upon discussing this with the patient, she states she is working to get set up with endocrinology for further management of her poorly controlled high blood sugars.    Patient states that she still feels like her heart is racing at this time, however heart rate with pulse ox and cardiac monitoring reveals pulse in the 80s throughout hospital stay.  EKG reveals right bundle branch block which is her baseline.  No concern for ACS at this time.  I suspect that patient's symptoms are likely due to combination of anxiety and uncontrolled diabetes.  I have educated the patient on the damaging effects of long-term  uncontrolled diabetes, I discussed the importance of getting her into see endocrinology ASAP for further management of this.  Patient states understanding, and is amenable with plan of discharge.  She has been informed of red flag symptoms that would prompt immediate return."  On review pt never got [name] of left lower ext thought CT chest was normal.   Pt states she is having flutter/gurgling sensation to her chest on the way here. Pt  states no chest pain. Though on Saturday had some chest pain and on Sunday night. Both times disomfort was 5-10 seconds. No arm pain, no jaw pain, no sweats or chills. Her fluttering/palpitation sensation resolved at beginning of interview. Ekg done when had symptoms.   Pt states recently having nausea after eating. When she eats will get very nauseated. Pt has appt with GI MD Dr. Benson Norway.  Pt has diabetes. I put in referral to endocrinologist 09-2020. Pt was sick and missed appointment. She stats endocrinologist office states she has to be referred to other. Pt missed 2 appointment. 2nd time she tried to call and cancel. No one answered per pt report.   Pt is on [place] presently. Last a1c 12.5.  Pt has high choleserol she is on crestor.   Review of Systems  Constitutional:  Negative for chills, fatigue and fever.  HENT:  Negative for congestion.   Respiratory:  Negative for cough, choking, shortness of breath and wheezing.   Cardiovascular:  Positive for palpitations. Negative for chest pain.  Gastrointestinal:  Positive for abdominal pain and nausea. Negative for abdominal distention, blood in stool, diarrhea, rectal pain and vomiting.       Abd pain after eating. At times upper throat area feels greasy.  Genitourinary:  Negative for dysuria.  Musculoskeletal:  Negative for back pain.  Neurological:  Negative for dizziness, syncope, weakness and headaches.    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   ANKLE PAIN, LEFT 06/26/2008   BACK PAIN, LUMBAR 10/15/2007   BIPOLAR DISORDER UNSPECIFIED 09/13/2007   CERVICAL RADICULOPATHY, LEFT 06/26/2008   Cervicalgia 10/15/2007   Chronic pain syndrome 03/21/2011   COMMON MIGRAINE 06/26/2008   DIABETES MELLITUS, TYPE II 06/05/2007   DYSPNEA 12/05/2007   EAR PAIN, RIGHT 12/05/2007   ELEVATED BP READING WITHOUT DX  HYPERTENSION 07/29/2009   FATIGUE 12/05/2007   FIBROMYALGIA 06/26/2008   GASTROENTERITIS, ACUTE 12/15/2008   GERD 06/26/2008   Headache(784.0) 09/13/2007   HYPERLIPIDEMIA 09/13/2007   JOINT EFFUSION, RIGHT KNEE 07/29/2009   KNEE PAIN, LEFT 11/21/2010   LUMBAR RADICULOPATHY, LEFT 06/26/2008   OTHER DISEASE OF PHARYNX OR NASOPHARYNX 07/03/2008   PERIPHERAL EDEMA 01/06/2009   POLYARTHRITIS 11/21/2010   SHOULDER PAIN, BILATERAL 07/03/2008   SINUSITIS- ACUTE-NOS 07/29/2009   TACHYCARDIA 12/05/2007   THYROID NODULE, RIGHT 11/20/2008   TREMOR 01/02/2008   Vertigo      Social History   Socioeconomic History   Marital status: Significant Other    Spouse name: Not on file   Number of children: 3   Years of education: Not on file   Highest education level: Not on file  Occupational History    Employer: UNEMPLOYED  Tobacco Use   Smoking status: Never   Smokeless tobacco: Never  Vaping Use   Vaping Use: Never used  Substance and Sexual Activity   Alcohol use: No   Drug use: No   Sexual activity: Not  on file  Other Topics Concern   Not on file  Social History Narrative   Not on file   Social Determinants of Health   Financial Resource Strain: Not on file  Food Insecurity: Not on file  Transportation Needs: Not on file  Physical Activity: Not on file  Stress: Not on file  Social Connections: Not on file  Intimate Partner Violence: Not on file    Past Surgical History:  Procedure Laterality Date   back surgury     lumbar 2001   CESAREAN SECTION     x 3   thyroid fine needle aspiration  May 2008   showed non neoplastic goiter   VAGINAL HYSTERECTOMY     fibroids    Family History  Problem Relation Age of Onset   Thyroid disease Mother    Diabetes Mother    Asthma Mother    Hypertension Father    Hyperlipidemia Father    Diabetes Father    Emphysema Other    Coronary artery disease Other    Cancer Other        pancreatic and breast cancer   Cancer Other        lung and  prostate cancer   Breast cancer Neg Hx     Allergies  Allergen Reactions   Sulfa Antibiotics Anaphylaxis   Gadolinium Derivatives Itching    Severe heaving- can take benadryl without having reaction   Promethazine Hcl     Iv dose with benadryl causes severe aggrivation   Reglan [Metoclopramide]     Makes me very aggitated    Current Outpatient Medications on File Prior to Visit  Medication Sig Dispense Refill   benzonatate (TESSALON) 100 MG capsule Take 1 capsule (100 mg total) by mouth every 8 (eight) hours. 21 capsule 0   conjugated estrogens (PREMARIN) vaginal cream Place 1 Applicatorful vaginally daily. 42.5 g 12   cyclobenzaprine (FLEXERIL) 10 MG tablet Take 0.5 tablets (5 mg total) by mouth 3 (three) times daily as needed. 45 tablet 1   diclofenac Sodium (VOLTAREN) 1 % GEL Apply 4 g topically 4 (four) times daily. To affected joint. 100 g 11   JANUVIA 50 MG tablet Take 50 mg by mouth daily.     meloxicam (MOBIC) 15 MG tablet One tab PO qAM with breakfast for 2 weeks, then daily prn pain. 30 tablet 3   progesterone (PROMETRIUM) 200 MG capsule Take 1 capsule (200 mg total) by mouth daily. 90 capsule 3   fluticasone (FLONASE) 50 MCG/ACT nasal spray Place 2 sprays into both nostrils daily for 14 days. 11.1 mL 0   metoprolol tartrate (LOPRESSOR) 25 MG tablet Take 1 tablet (25 mg total) by mouth 2 (two) times daily. 180 tablet 3   rosuvastatin (CRESTOR) 20 MG tablet Take 1 tablet (20 mg total) by mouth daily. 90 tablet 3   No current facility-administered medications on file prior to visit.    BP (!) 152/82 (BP Location: Right Arm, Cuff Size: Large)   Pulse 98   Temp 98.4 F (36.9 C) (Oral)   Resp 12   Ht 5\' 4"  (1.626 m)   Wt 199 lb (90.3 kg)   SpO2 99%   BMI 34.16 kg/m       Objective:   Physical Exam   General Mental Status- Alert. General Appearance- Not in acute distress.   Skin General: Color- Normal Color. Moisture- Normal Moisture.  Neck Carotid Arteries-  Normal color. Moisture- Normal Moisture. No carotid bruits. No JVD.  Chest and Lung Exam Auscultation: Breath Sounds:-Normal.  Cardiovascular Auscultation:Rythm- Regular. Murmurs & Other Heart Sounds:Auscultation of the heart reveals- No Murmurs.  Abdomen Inspection:-Inspeection Normal. Palpation/Percussion:Note:No mass. Palpation and Percussion of the abdomen reveal- Non Tender, Non Distended + BS, no rebound or guarding.   Neurologic Cranial Nerve exam:- CN III-XII intact(No nystagmus), symmetric smile. Strength:- 5/5 equal and symmetric strength both upper and lower extremities.      Assessment & Plan:   Patient Instructions  History of recent palpitations and brief atypical chest discomfort lasting only seconds.  Important to note no chest pain during interview or earlier today.  Last time she had brief transient pain was on Sunday.  EKG today shows no change prior to EKG done in the emergency department.  Shows sinus rhythm with right bundle branch block.  This is not a new finding.  Patient is on beta-blocker.  Advised to continue.  For caution sake and getting 1 stat troponin.  Second set not indicated based on no pain presently.  However if you do have recurrent chest pain or associated type cardiac signs/symptoms then be seen in emergency department.  Keep appointment with cardiologist in 1 week.  Left lower extremity pain recently.  Calf to popliteal area.  CT chest done in the emergency department showed no PE but no left lower extremity ultrasound was done.  Please go downstairs to get scheduled for the study.  I would like for you to get the study today.  Type 2 diabetes with last A1c 12.5 in December 2012.  Continue Januvia and placing new endocrinology referral.  Getting T0V and metabolic panel today.  Recent epigastric pain and nausea with eating.  Prescribing famotidine and Zofran.  Considering possibility of GERD, H. pylori and gastroparesis.  Please keep appointment  with GI MD tomorrow.  History of hyperlipidemia.  Continue Crestor.  Getting lipid panel today.  Give me an update if having intermittent low-level palpitation type sensation.  In that event would call Dr. Joya Gaskins office or send it to her office communication to see if cardiology might be able to see you sooner.  You might need Holter.  Possible stress test or other studies.  Follow-up in 10 days or sooner if needed.    Time spent with patient today was  46 minutes which consisted of chart review, discussing diagnosis, work up, treatment and documentation.

## 2021-07-12 NOTE — Telephone Encounter (Signed)
Transition Care Management Unsuccessful Follow-up Telephone Call  Date of discharge and from where:  07/10/2021-[facility]  Attempts:  2nd Attempt  Reason for unsuccessful TCM follow-up call:  Left voice message

## 2021-07-13 DIAGNOSIS — E1165 Type 2 diabetes mellitus with hyperglycemia: Secondary | ICD-10-CM | POA: Diagnosis not present

## 2021-07-13 DIAGNOSIS — R112 Nausea with vomiting, unspecified: Secondary | ICD-10-CM | POA: Diagnosis not present

## 2021-07-13 DIAGNOSIS — K219 Gastro-esophageal reflux disease without esophagitis: Secondary | ICD-10-CM | POA: Diagnosis not present

## 2021-07-13 LAB — H. PYLORI BREATH TEST: H. pylori Breath Test: NOT DETECTED

## 2021-07-14 NOTE — Telephone Encounter (Signed)
Transition Care Management Follow-up Telephone Call Date of discharge and from where: 07/10/2021 from [facility] How have you been since you were released from the hospital? Pt stated that she is feeling better and did not have any questions at this time.  Any questions or concerns? No  Items Reviewed: Did the pt receive and understand the discharge instructions provided? Yes  Medications obtained and verified? Yes  Other? No  Any new allergies since your discharge? No  Dietary orders reviewed? No Do you have support at home? Yes   Functional Questionnaire: (I = Independent and D = Dependent) ADLs: I  Bathing/Dressing- I  Meal Prep- I  Eating- I  Maintaining continence- I  Transferring/Ambulation- I  Managing Meds- I   Follow up appointments reviewed:  PCP Hospital f/u appt confirmed? No  PCP appt on 07/12/2021 Specialist Hospital f/u appt confirmed? No   Are transportation arrangements needed? No  If their condition worsens, is the pt aware to call PCP or go to the Emergency Dept.? Yes Was the patient provided with contact information for the PCP's office or ED? Yes Was to pt encouraged to call back with questions or concerns? Yes

## 2021-07-19 ENCOUNTER — Encounter: Payer: Self-pay | Admitting: Cardiology

## 2021-07-19 ENCOUNTER — Other Ambulatory Visit: Payer: Self-pay

## 2021-07-19 ENCOUNTER — Ambulatory Visit (INDEPENDENT_AMBULATORY_CARE_PROVIDER_SITE_OTHER): Payer: Medicaid Other | Admitting: Cardiology

## 2021-07-19 VITALS — BP 168/80 | HR 88 | Ht 64.0 in | Wt 201.1 lb

## 2021-07-19 DIAGNOSIS — R000 Tachycardia, unspecified: Secondary | ICD-10-CM | POA: Diagnosis not present

## 2021-07-19 DIAGNOSIS — I4510 Unspecified right bundle-branch block: Secondary | ICD-10-CM | POA: Diagnosis not present

## 2021-07-19 DIAGNOSIS — I10 Essential (primary) hypertension: Secondary | ICD-10-CM | POA: Diagnosis not present

## 2021-07-19 MED ORDER — CARVEDILOL 12.5 MG PO TABS: 12.5000 mg | ORAL_TABLET | Freq: Two times a day (BID) | ORAL | 3 refills | Status: DC

## 2021-07-19 NOTE — Progress Notes (Signed)
Cardiology Office Note:    Date:  07/19/2021   ID:  [name], DOB 02-10-1976, MRN [id number]  PCP:  Mackie Pai, PA-C  Cardiologist:  Shirlee More, MD    Referring MD: Mackie Pai, PA-C    ASSESSMENT:    1. Primary hypertension   2. Sinus tachycardia   3. 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  Concerns regarding medicines are outlined above.  No orders of the defined types were placed in this encounter.  Meds ordered this encounter  Medications   carvedilol (COREG) 12.5 MG tablet    Sig: Take 1 tablet (12.5 mg total) by mouth 2 (two) times daily.    Dispense:  180 tablet    Refill:  3     Chief Complaint  Patient presents with   Follow-up   Tachycardia    History of Present Illness:    MAGGY WYBLE is a 45 y.o. female with a hx of palpitation right bundle branch block and sinus tachycardia in the setting of uncontrolled diabetes and hyponatremia last seen 11/24/2020.She had an echocardiogram 08/09/2020 showing normal left ventricular size systolic function diastolic filling pressures and normal right ventricle.  She was last seen 10/04/2020.  She had COVID-19 infection diagnosed January 2022 with persistent symptoms of fatigue and autonomic dysfunction with sinus tachycardia.  Recent left lower extremity venous duplex study showed no findings of DVT.  EKG 07/10/2021 independently reviewed shows sinus rhythm right bundle branch block and there is no finding  of pericarditis.  In the emergency room that day with complaints of left lower leg injury shortness of breath and palpitation she had a CTA of the chest that showed no evidence of pulmonary embolism her blood sugar was quite elevated 276 and despite complaints of palpitation rapid heart rhythm the monitor showed heart rates in the 80s throughout the ED stay.  CMP was otherwise normal creatinine 0.58 hemoglobin 12.4 D-dimer was elevated 0.63 CT of the chest showed trace bilateral pleural effusions no findings of pulmonary embolism.  Her high-sensitivity troponin was low at 7 globin A1c recently severely elevated 11.1.  Thyroid testing 9 months ago TSH free T3 free T4 were all normal.  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 She is more active and optimistic that will impact her diabetes Tachycardia has resolved but blood pressure remains elevated. No edema shortness of breath chest pain palpitation or syncope. I reviewed her testing detailed above with the patient 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   ANKLE PAIN, LEFT 06/26/2008   BACK PAIN, LUMBAR 10/15/2007   BIPOLAR DISORDER UNSPECIFIED 09/13/2007   CERVICAL RADICULOPATHY, LEFT 06/26/2008   Cervicalgia 10/15/2007   Chronic pain syndrome 03/21/2011   COMMON MIGRAINE 06/26/2008   DIABETES MELLITUS, TYPE II 06/05/2007   DYSPNEA 12/05/2007   EAR PAIN, RIGHT 12/05/2007   ELEVATED BP  READING WITHOUT DX HYPERTENSION 07/29/2009   FATIGUE 12/05/2007   FIBROMYALGIA 06/26/2008   GASTROENTERITIS, ACUTE 12/15/2008   GERD 06/26/2008   Headache(784.0) 09/13/2007   HYPERLIPIDEMIA 09/13/2007   JOINT EFFUSION, RIGHT KNEE 07/29/2009   KNEE PAIN, LEFT 11/21/2010   LUMBAR RADICULOPATHY, LEFT 06/26/2008   OTHER DISEASE OF PHARYNX OR NASOPHARYNX  07/03/2008   PERIPHERAL EDEMA 01/06/2009   POLYARTHRITIS 11/21/2010   SHOULDER PAIN, BILATERAL 07/03/2008   SINUSITIS- ACUTE-NOS 07/29/2009   TACHYCARDIA 12/05/2007   THYROID NODULE, RIGHT 11/20/2008   TREMOR 01/02/2008   Vertigo     Past Surgical History:  Procedure Laterality Date   back surgury     lumbar 2001   CESAREAN SECTION     x 3   thyroid fine needle aspiration  May 2008   showed non neoplastic goiter   VAGINAL HYSTERECTOMY     fibroids    Current Medications: Current Meds  Medication Sig   carvedilol (COREG) 12.5 MG tablet Take 1 tablet (12.5 mg total) by mouth 2 (two) times daily.   conjugated estrogens (PREMARIN) vaginal cream Place 1 Applicatorful vaginally daily.   cyclobenzaprine (FLEXERIL) 10 MG tablet Take 0.5 tablets (5 mg total) by mouth 3 (three) times daily as needed. (Patient taking differently: Take 5 mg by mouth 3 (three) times daily as needed for muscle spasms.)   diclofenac Sodium (VOLTAREN) 1 % GEL Apply 4 g topically 4 (four) times daily. To affected joint.   famotidine (PEPCID) 20 MG tablet Take 1 tablet (20 mg total) by mouth 2 (two) times daily.   fluticasone (FLONASE) 50 MCG/ACT nasal spray Place 2 sprays into both nostrils daily for 14 days. (Patient taking differently: Place 2 sprays into both nostrils daily as needed for allergies or rhinitis.)   JANUVIA 50 MG tablet Take 50 mg by mouth daily.   meloxicam (MOBIC) 15 MG tablet One tab PO qAM with breakfast for 2 weeks, then daily prn pain.   progesterone (PROMETRIUM) 200 MG capsule Take 1 capsule (200 mg total) by mouth daily.   rosuvastatin (CRESTOR) 20 MG tablet Take 1 tablet (20 mg total) by mouth daily.   [DISCONTINUED] metoprolol tartrate (LOPRESSOR) 25 MG tablet Take 1 tablet (25 mg total) by mouth 2 (two) times daily.     Allergies:   Sulfa antibiotics, Contrast media [iodinated diagnostic agents], Gadolinium derivatives, Promethazine hcl, and Reglan [metoclopramide]   Social History    Socioeconomic History   Marital status: Significant Other    Spouse name: Not on file   Number of children: 3   Years of education: Not on file   Highest education level: Not on file  Occupational History    Employer: UNEMPLOYED  Tobacco Use   Smoking status: Never   Smokeless tobacco: Never  Vaping Use   Vaping Use: Never used  Substance and Sexual Activity   Alcohol use: No   Drug use: No   Sexual activity: Not on file  Other Topics Concern   Not on file  Social History Narrative   Not on file   Social Determinants of Health   Financial Resource Strain: Not on file  Food Insecurity: Not on file  Transportation Needs: Not on file  Physical Activity: Not on file  Stress: Not on file  Social Connections: Not on file     Family History: The patient's family history includes Asthma in her mother; Cancer in some other family members; Coronary artery disease in an other family member; Diabetes in her father and  mother; Emphysema in an other family member; Hyperlipidemia in her father; Hypertension in her father; Thyroid disease in her mother. There is no history of Breast cancer. ROS:   Please see the history of present illness.    All other systems reviewed and are negative.  EKGs/Labs/Other Studies Reviewed:    The following studies were reviewed today:    Recent Labs: 10/04/2020: TSH 0.907 11/24/2020: NT-Pro BNP 6 07/10/2021: Hemoglobin 12.4; Platelets 411 07/12/2021: ALT 22; BUN 10; Creatinine, Ser 0.67; Potassium 4.3; Sodium 135  Recent Lipid Panel    Component Value Date/Time   CHOL 268 (H) 10/04/2020 1616   TRIG 152 (H) 10/04/2020 1616   HDL 57 10/04/2020 1616   CHOLHDL 4.7 (H) 10/04/2020 1616   CHOLHDL 5 05/26/2011 1238   VLDL 22.8 05/26/2011 1238   LDLCALC 183 (H) 10/04/2020 1616   LDLDIRECT 165.1 05/26/2011 1238    Physical Exam:    VS:  BP (!) 168/80 (BP Location: Right Arm, Patient Position: Sitting, Cuff Size: Normal)   Pulse 88   Ht 5\' 4"   (1.626 m)   Wt 201 lb 1.3 oz (91.2 kg)   SpO2 99%   BMI 34.52 kg/m     Wt Readings from Last 3 Encounters:  07/19/21 201 lb 1.3 oz (91.2 kg)  07/12/21 199 lb (90.3 kg)  07/11/21 210 lb (95.3 kg)     GEN:  Well nourished, well developed in no acute distress HEENT: Normal NECK: No JVD; No carotid bruits LYMPHATICS: No lymphadenopathy CARDIAC: RRR, no murmurs, rubs, gallops RESPIRATORY:  Clear to auscultation without rales, wheezing or rhonchi  ABDOMEN: Soft, non-tender, non-distended MUSCULOSKELETAL:  No edema; No deformity  SKIN: Warm and dry NEUROLOGIC:  Alert and oriented x 3 PSYCHIATRIC:  Normal affect    Signed, Shirlee More, MD  07/19/2021 3:15 PM    [facility]

## 2021-07-19 NOTE — Patient Instructions (Signed)
Medication Instructions:  Your physician has recommended you make the following change in your medication:  STOP: Metoprolol  START: Carvedilol 12.5 mg take one tablet by mouth twice daily.  *If you need a refill on your cardiac medications before your next appointment, please call your pharmacy*   Lab Work: None If you have labs (blood work) drawn today and your tests are completely normal, you will receive your results only by: [place] (if you have MyChart) OR A paper copy in the mail If you have any lab test that is abnormal or we need to change your treatment, we will call you to review the results.   Testing/Procedures: None   Follow-Up: At [facility], you and your health needs are our priority.  As part of our continuing mission to provide you with exceptional heart care, we have created designated Provider Care Teams.  These Care Teams include your primary Cardiologist (physician) and Advanced Practice Providers (APPs -  Physician Assistants and Nurse Practitioners) who all work together to provide you with the care you need, when you need it.  We recommend signing up for the patient portal called "MyChart".  Sign up information is provided on this After Visit Summary.  MyChart is used to connect with patients for Virtual Visits (Telemedicine).  Patients are able to view lab/test results, encounter notes, upcoming appointments, etc.  Non-urgent messages can be sent to your provider as well.   To learn more about what you can do with MyChart, go to [email or web address].    Your next appointment:   6 month(s)  The format for your next appointment:   In Person  Provider:   Shirlee More, MD   Other Instructions

## 2021-07-25 ENCOUNTER — Other Ambulatory Visit: Payer: Self-pay

## 2021-07-25 ENCOUNTER — Ambulatory Visit (INDEPENDENT_AMBULATORY_CARE_PROVIDER_SITE_OTHER): Payer: Medicaid Other | Admitting: Medical

## 2021-07-25 VITALS — BP 119/63 | HR 96 | Resp 18 | Ht 64.0 in | Wt 195.0 lb

## 2021-07-25 DIAGNOSIS — E119 Type 2 diabetes mellitus without complications: Secondary | ICD-10-CM | POA: Diagnosis not present

## 2021-07-25 DIAGNOSIS — I10 Essential (primary) hypertension: Secondary | ICD-10-CM

## 2021-07-25 DIAGNOSIS — R110 Nausea: Secondary | ICD-10-CM | POA: Diagnosis not present

## 2021-07-25 DIAGNOSIS — R000 Tachycardia, unspecified: Secondary | ICD-10-CM

## 2021-07-25 DIAGNOSIS — R1013 Epigastric pain: Secondary | ICD-10-CM | POA: Diagnosis not present

## 2021-07-25 DIAGNOSIS — F419 Anxiety disorder, unspecified: Secondary | ICD-10-CM

## 2021-07-25 MED ORDER — SITAGLIPTIN PHOSPHATE 100 MG PO TABS: 100.0000 mg | ORAL_TABLET | Freq: Every day | ORAL | 3 refills | Status: DC

## 2021-07-25 MED ORDER — CYCLOBENZAPRINE HCL 5 MG PO TABS: ORAL_TABLET | ORAL | 0 refills | Status: DC

## 2021-07-25 MED ORDER — BUSPIRONE HCL 7.5 MG PO TABS: 7.5000 mg | ORAL_TABLET | Freq: Two times a day (BID) | ORAL | 0 refills | Status: DC

## 2021-07-25 NOTE — Progress Notes (Addendum)
Subjective:    Patient ID: Shirley [name], female    DOB: [birth year], 45 y.o.   MRN: [id number]  HPI  Pt in for follow up.  Hx of htn and tachycrdia. Pt saw Dr. Bettina Gavia last week.  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   Pt mentions she is making lifestyle changes. She is eating better and avoid ing sodas.  Hx of high cholesterol. She is on crestor.  Last a1c was 11.1. Pt is on janvia 50 mg daily. She states her insurance is covering. Pt states metformin in the past gave her diarrhea and caused her nausea.  I had placed endocrinologist referral end of September.  On issue she has get almost near syncope with finger stick monitor.  Hx of gerd and nausea. Pt famotadine and zofran. She still has upset stomach and nausea.  For anxiety in past had written buspar.(Pt job was major job stress)     Review of Systems  Constitutional:  Negative for chills, fatigue and fever.  Respiratory:  Negative for cough, choking, chest tightness, shortness of breath and wheezing.   Cardiovascular:  Negative for chest pain and palpitations.  Gastrointestinal:  Positive for abdominal pain and nausea. Negative for blood in stool, diarrhea and rectal pain.  Genitourinary:  Negative for difficulty urinating, dysuria, enuresis, frequency, hematuria and pelvic pain.  Musculoskeletal:  Negative for back pain, gait problem, myalgias and neck stiffness.       Trapezius muscles very tight and tender. Mild ha.  Skin:  Negative for pallor and rash.  Neurological:  Positive for headaches. Negative for dizziness, seizures, syncope, weakness and light-headedness.       Off and  on mild.  Hematological:  Negative for adenopathy. Does not bruise/bleed easily.  Psychiatric/Behavioral:  Positive for sleep disturbance. Negative for agitation, decreased concentration, self-injury and suicidal ideas. The patient is nervous/anxious.    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   ANKLE PAIN, LEFT 06/26/2008   BACK PAIN, LUMBAR 10/15/2007   BIPOLAR DISORDER UNSPECIFIED 09/13/2007   CERVICAL RADICULOPATHY, LEFT 06/26/2008   Cervicalgia 10/15/2007   Chronic pain syndrome 03/21/2011   COMMON MIGRAINE 06/26/2008   DIABETES MELLITUS, TYPE II 06/05/2007   DYSPNEA 12/05/2007   EAR PAIN, RIGHT 12/05/2007   ELEVATED BP READING WITHOUT DX HYPERTENSION 07/29/2009   FATIGUE 12/05/2007   FIBROMYALGIA 06/26/2008   GASTROENTERITIS, ACUTE 12/15/2008   GERD 06/26/2008   Headache(784.0) 09/13/2007   HYPERLIPIDEMIA 09/13/2007   JOINT EFFUSION, RIGHT KNEE 07/29/2009   KNEE PAIN, LEFT 11/21/2010   LUMBAR RADICULOPATHY, LEFT 06/26/2008   OTHER DISEASE OF PHARYNX OR NASOPHARYNX 07/03/2008   PERIPHERAL EDEMA 01/06/2009   POLYARTHRITIS 11/21/2010   SHOULDER PAIN, BILATERAL 07/03/2008   SINUSITIS- ACUTE-NOS 07/29/2009   TACHYCARDIA 12/05/2007   THYROID NODULE, RIGHT 11/20/2008   TREMOR 01/02/2008   Vertigo      Social History   Socioeconomic History   Marital status: Significant Other    Spouse name: Not on file   Number of children: 3   Years of education: Not on file   Highest education level: Not  on file  Occupational History    Employer: UNEMPLOYED  Tobacco Use   Smoking status: Never   Smokeless tobacco: Never  Vaping Use   Vaping Use: Never used  Substance and Sexual Activity   Alcohol use: No   Drug use: No   Sexual activity: Not on file  Other Topics Concern   Not on file  Social History Narrative   Not on file   Social Determinants of Health    Financial Resource Strain: Not on file  Food Insecurity: Not on file  Transportation Needs: Not on file  Physical Activity: Not on file  Stress: Not on file  Social Connections: Not on file  Intimate Partner Violence: Not on file    Past Surgical History:  Procedure Laterality Date   back surgury     lumbar 2001   CESAREAN SECTION     x 3   thyroid fine needle aspiration  May 2008   showed non neoplastic goiter   VAGINAL HYSTERECTOMY     fibroids    Family History  Problem Relation Age of Onset   Thyroid disease Mother    Diabetes Mother    Asthma Mother    Hypertension Father    Hyperlipidemia Father    Diabetes Father    Emphysema Other    Coronary artery disease Other    Cancer Other        pancreatic and breast cancer   Cancer Other        lung and prostate cancer   Breast cancer Neg Hx     Allergies  Allergen Reactions   Sulfa Antibiotics Anaphylaxis   Contrast Media [Iodinated Diagnostic Agents] Itching    Mri, severe heaving ~ can take benadryl without reaction   Gadolinium Derivatives Itching    Severe heaving- can take benadryl without having reaction   Promethazine Hcl     Iv dose with benadryl causes severe aggrivation   Reglan [Metoclopramide]     Makes me very aggitated    Current Outpatient Medications on File Prior to Visit  Medication Sig Dispense Refill   carvedilol (COREG) 12.5 MG tablet Take 1 tablet (12.5 mg total) by mouth 2 (two) times daily. 180 tablet 3   conjugated estrogens (PREMARIN) vaginal cream Place 1 Applicatorful vaginally daily. 42.5 g 12   cyclobenzaprine (FLEXERIL) 10 MG tablet Take 0.5 tablets (5 mg total) by mouth 3 (three) times daily as needed. (Patient taking differently: Take 5 mg by mouth 3 (three) times daily as needed for muscle spasms.) 45 tablet 1   diclofenac Sodium (VOLTAREN) 1 % GEL Apply 4 g topically 4 (four) times daily. To affected joint. 100 g 11   famotidine (PEPCID) 20 MG tablet Take 1 tablet (20 mg  total) by mouth 2 (two) times daily. 60 tablet 0   fluticasone (FLONASE) 50 MCG/ACT nasal spray Place 2 sprays into both nostrils daily for 14 days. (Patient taking differently: Place 2 sprays into both nostrils daily as needed for allergies or rhinitis.) 11.1 mL 0   meloxicam (MOBIC) 15 MG tablet One tab PO qAM with breakfast for 2 weeks, then daily prn pain. 30 tablet 3   progesterone (PROMETRIUM) 200 MG capsule Take 1 capsule (200 mg total) by mouth daily. 90 capsule 3   rosuvastatin (CRESTOR) 20 MG tablet Take 1 tablet (20 mg total) by mouth daily. 90 tablet 3   No current facility-administered medications on file prior to visit.    BP 119/63   Pulse 96  Resp 18   Ht 5\' 4"  (1.626 m)   Wt 195 lb (88.5 kg)   SpO2 99%   BMI 33.47 kg/m        Objective:   Physical Exam  General Mental Status- Alert. General Appearance- Not in acute distress.   Skin General: Color- Normal Color. Moisture- Normal Moisture.  Neck No stiffness. Very tender trapezius muscles.  Chest and Lung Exam Auscultation: Breath Sounds:-Normal.  Cardiovascular Auscultation:Rythm- Regular. Murmurs & Other Heart Sounds:Auscultation of the heart reveals- No Murmurs.  Abdomen Inspection:-Inspeection Normal. Palpation/Percussion:Note:No mass. Palpation and Percussion of the abdomen reveal- Non Tender, Non Distended + BS, no rebound or guarding.   Neurologic Cranial Nerve exam:- CN III-XII intact(No nystagmus), symmetric smile. Strength:- 5/5 equal and symmetric strength both upper and lower extremities.    Lower ext- no pedal edema. Negative homans signs.     Assessment & Plan:   For type diabetes not controlled increase your januvia to 100 mg daily. Continue low sugar diet daily and daily exercise.  For gerd type symptoms and nausea continue famotadine and zofran. Follow thru with GI MD work up as concern you may have gastroparesis.  For Htn and tachycardia continue coreg prescribed by Dr.  Bettina Gavia.   For anxiety continue buspar. Gave refill today.  Follow up in 3-4 weeks or sooner if needed

## 2021-07-25 NOTE — Addendum Note (Signed)
Addended by: Anabel Halon on: 07/25/2021 04:19 PM   Modules accepted: Orders

## 2021-07-25 NOTE — Patient Instructions (Addendum)
For type diabetes not controlled increase your januvia to 100 mg daily. Continue low sugar diet daily and daily exercise.  For gerd type symptoms and nausea continue famotadine and zofran. Follow thru with GI MD work up as concern you may have gastroparesis.  For Htn and tachycardia continue coreg prescribed by Dr. Bettina Gavia. Keep checking bp and if going above 140/90 let us know. Today bp very well controlled.  For anxiety continue buspar. Gave refill today.  Probable tension ha as trapezius muscle are very tight. Rx low dose flexeril 5 mg at night.  Follow up in 3-4 weeks or sooner if needed

## 2021-08-02 ENCOUNTER — Telehealth: Payer: Self-pay

## 2021-08-02 NOTE — Progress Notes (Signed)
Cardiology Office Note:    Date:  08/03/2021   ID:  Shirley [name], DOB 1975-11-04, MRN [id number]  PCP:  Mackie Pai, PA-C  Cardiologist:  Shirley More, MD    Referring MD: Mackie Pai, PA-C    ASSESSMENT:    1. Preop cardiovascular exam   2. Right bundle branch block   3. Primary hypertension   4. Sinus tachycardia   5. Chest pain of uncertain etiology    PLAN:    In order of problems listed above:  In my opinion she is optimized for the planned procedure of endoscopy although I will ask her to have a perfusion study performed prior to the schedule 08/25/2021 Stable EKG pattern Appears to be well controlled at this time with a combination of beta-blocker calcium channel blocker Improved with rate slowing medications Nonanginal however at increased risk with her poorly controlled diabetes and we will do a myocardial perfusion study to screen for CAD if abnormal may require coronary angiography and if normal perfusion and function she will be at low risk for planned endoscopic procedures   Next appointment: 6 months   Medication Adjustments/Labs and Tests Ordered: Current medicines are reviewed at length with the patient today.  Concerns regarding medicines are outlined above.  No orders of the defined types were placed in this encounter.  No orders of the defined types were placed in this encounter.   I had an episode of my heart racing blood pressure elevated and discomfort in my arms and chest   History of Present Illness:    Shirley [name] is a 45 y.o. female with a hx of right bundle branch block and sinus tachycardia in the setting of uncontrolled diabetes with hyponatremia and hypertension last seen 07/19/2021.  She had COVID-19 infection contributing to her rapid heart rates.She had COVID-19 infection diagnosed January 2022 with persistent symptoms of fatigue and autonomic dysfunction with sinus tachycardia.  Recent left lower extremity venous duplex  study showed no findings of DVT.  EKG 07/10/2021 independently reviewed shows sinus rhythm right bundle branch block and there is no finding of pericarditis .  She is worked into the office hours today pending minor GI procedure endoscopy with complaints of elevated blood pressure and chest discomfort.  Compliance with diet, lifestyle and medications: Yes  She continues to have autonomic instability since COVID-19 infection and the night before she was called by the preop people she had her heart feeling as if it was rapid blood pressure was 181/108 heart rate 107 bpm and she had discomfort in drawing feeling in her arms and in her chest.  It lasted for about an hour.  Conversely with physical effort she has no chest pain shortness of breath palpitation or syncope.  Home blood pressures are running greater than 170 yet in my office her blood pressure is normal.  She does not have typical angina 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   ANKLE PAIN, LEFT 06/26/2008   BACK PAIN, LUMBAR 10/15/2007   BIPOLAR DISORDER UNSPECIFIED 09/13/2007   CERVICAL RADICULOPATHY, LEFT 06/26/2008   Cervicalgia 10/15/2007   Chronic pain syndrome 03/21/2011   COMMON MIGRAINE 06/26/2008   DIABETES MELLITUS, TYPE II 06/05/2007   DYSPNEA 12/05/2007   EAR PAIN, RIGHT 12/05/2007   ELEVATED BP READING WITHOUT DX HYPERTENSION 07/29/2009   FATIGUE 12/05/2007   FIBROMYALGIA 06/26/2008   GASTROENTERITIS, ACUTE  12/15/2008   GERD 06/26/2008   Headache(784.0) 09/13/2007   HYPERLIPIDEMIA 09/13/2007   JOINT EFFUSION, RIGHT KNEE 07/29/2009   KNEE PAIN, LEFT 11/21/2010   LUMBAR RADICULOPATHY, LEFT 06/26/2008   OTHER DISEASE OF PHARYNX OR NASOPHARYNX 07/03/2008   PERIPHERAL EDEMA 01/06/2009   POLYARTHRITIS 11/21/2010   SHOULDER PAIN, BILATERAL 07/03/2008   SINUSITIS- ACUTE-NOS 07/29/2009   TACHYCARDIA 12/05/2007    THYROID NODULE, RIGHT 11/20/2008   TREMOR 01/02/2008   Vertigo     Past Surgical History:  Procedure Laterality Date   back surgury     lumbar 2001   CESAREAN SECTION     x 3   thyroid fine needle aspiration  May 2008   showed non neoplastic goiter   VAGINAL HYSTERECTOMY     fibroids    Current Medications: Current Meds  Medication Sig   busPIRone (BUSPAR) 7.5 MG tablet Take 1 tablet (7.5 mg total) by mouth 2 (two) times daily.   carvedilol (COREG) 12.5 MG tablet Take 1 tablet (12.5 mg total) by mouth 2 (two) times daily.   conjugated estrogens (PREMARIN) vaginal cream Place 1 Applicatorful vaginally daily.   cyclobenzaprine (FLEXERIL) 5 MG tablet 1 tab po q hs prn tension ha   diclofenac Sodium (VOLTAREN) 1 % GEL Apply 4 g topically 4 (four) times daily. To affected joint.   diltiazem (CARDIZEM) 30 MG tablet Take 30 mg by mouth 2 (two) times daily.   famotidine (PEPCID) 20 MG tablet Take 1 tablet (20 mg total) by mouth 2 (two) times daily.   fluticasone (FLONASE) 50 MCG/ACT nasal spray Place 2 sprays into both nostrils daily for 14 days. (Patient taking differently: Place 2 sprays into both nostrils daily as needed for allergies or rhinitis.)   meloxicam (MOBIC) 15 MG tablet One tab PO qAM with breakfast for 2 weeks, then daily prn pain.   progesterone (PROMETRIUM) 200 MG capsule Take 1 capsule (200 mg total) by mouth daily.   rosuvastatin (CRESTOR) 20 MG tablet Take 1 tablet (20 mg total) by mouth daily.   sitaGLIPtin (JANUVIA) 100 MG tablet Take 1 tablet (100 mg total) by mouth daily.     Allergies:   Sulfa antibiotics, Contrast media [iodinated diagnostic agents], Gadolinium derivatives, Promethazine hcl, and Reglan [metoclopramide]   Social History   Socioeconomic History   Marital status: Significant Other    Spouse name: Not on file   Number of children: 3   Years of education: Not on file   Highest education level: Not on file  Occupational History    Employer:  UNEMPLOYED  Tobacco Use   Smoking status: Never   Smokeless tobacco: Never  Vaping Use   Vaping Use: Never used  Substance and Sexual Activity   Alcohol use: No   Drug use: No   Sexual activity: Not on file  Other Topics Concern   Not on file  Social History Narrative   Not on file   Social Determinants of Health   Financial Resource Strain: Not on file  Food Insecurity: Not on file  Transportation Needs: Not on file  Physical Activity: Not on file  Stress: Not on file  Social Connections: Not on file     Family History: The patient's family history includes Asthma in her mother; Cancer in some other family members; Coronary artery disease in an other family member; Diabetes in her father and mother; Emphysema in an other family member; Hyperlipidemia in her father; Hypertension in her father; Thyroid disease in her mother. There is  no history of Breast cancer. ROS:   Please see the history of present illness.    All other systems reviewed and are negative.  EKGs/Labs/Other Studies Reviewed:    The following studies were reviewed today:  EKG:  EKG ordered today and personally reviewed.  The ekg ordered today demonstrates sinus rhythm right bundle branch block  Recent Labs: 10/04/2020: TSH 0.907 11/24/2020: NT-Pro BNP 6 07/10/2021: Hemoglobin 12.4; Platelets 411 07/12/2021: ALT 22; BUN 10; Creatinine, Ser 0.67; Potassium 4.3; Sodium 135  Recent Lipid Panel    Component Value Date/Time   CHOL 268 (H) 10/04/2020 1616   TRIG 152 (H) 10/04/2020 1616   HDL 57 10/04/2020 1616   CHOLHDL 4.7 (H) 10/04/2020 1616   CHOLHDL 5 05/26/2011 1238   VLDL 22.8 05/26/2011 1238   LDLCALC 183 (H) 10/04/2020 1616   LDLDIRECT 165.1 05/26/2011 1238    Physical Exam:    VS:  BP 114/84 (BP Location: Left Arm, Patient Position: Sitting)   Pulse (!) 104   Ht 5\' 4"  (1.626 m)   Wt 169 lb (76.7 kg)   SpO2 99%   BMI 29.01 kg/m     Wt Readings from Last 3 Encounters:  08/03/21 169 lb  (76.7 kg)  07/25/21 195 lb (88.5 kg)  07/19/21 201 lb 1.3 oz (91.2 kg)     GEN:  Well nourished, well developed in no acute distress HEENT: Normal NECK: No JVD; No carotid bruits LYMPHATICS: No lymphadenopathy CARDIAC: RRR, no murmurs, rubs, gallops RESPIRATORY:  Clear to auscultation without rales, wheezing or rhonchi  ABDOMEN: Soft, non-tender, non-distended MUSCULOSKELETAL:  No edema; No deformity  SKIN: Warm and dry NEUROLOGIC:  Alert and oriented x 3 PSYCHIATRIC:  Normal affect    Signed, Shirley More, MD  08/03/2021 3:42 PM    [facility]

## 2021-08-02 NOTE — Telephone Encounter (Signed)
Left a message for the patient to call back and speak to the on-call preop APP of the day.  Since the procedure is in 2 days, I will attempt to contact her again in 1 hour.

## 2021-08-02 NOTE — Telephone Encounter (Signed)
Patient was scheduled with Dr. Bettina Gavia tomorrow at 3pm per his recommendation.    Encouraged patient to call back with any questions or concerns.

## 2021-08-02 NOTE — Telephone Encounter (Signed)
   [facility] Pre-operative Risk Assessment    Patient Name: Shirley [name]  DOB: [birth year] MRN: [id number]  HEARTCARE STAFF:  - IMPORTANT!!!!!! Under Visit Info/Reason for Call, type in Other and utilize the format Clearance MM/DD/YY or Clearance TBD. Do not use dashes or single digits. - Please review there is not already an duplicate clearance open for this procedure. - If request is for dental extraction, please clarify the # of teeth to be extracted. - If the patient is currently at the dentist's office, call Pre-Op Callback Staff (MA/nurse) to input urgent request.  - If the patient is not currently in the dentist office, please route to the Pre-Op pool.  Request for surgical clearance:  What type of surgery is being performed?  EGD  When is this surgery scheduled? 08/04/2021  What type of clearance is required (medical clearance vs. Pharmacy clearance to hold med vs. Both)? BOTH  Are there any medications that need to be held prior to surgery and how long? None noted  Practice name and name of physician performing surgery?  [facility], [place]  Dr Beryle Beams, MD  What is the office phone number?  [phone]   7.   What is the office fax number? [phone]  8.   Anesthesia type (None, local, MAC, general) ?  Propofol   Toni Arthurs 08/02/2021, 9:46 AM  _________________________________________________________________   (provider comments below)

## 2021-08-02 NOTE — Telephone Encounter (Signed)
Shirley [name] called our office and was transferred to me.  Her GI procedure has been rescheduled to November 3.  She says she had increased exercise yesterday during the day and walked longer than usual.  She denies any exertional symptoms or worsening dyspnea.  It was last night after she came home and rested for a while, she started having tingling sensation down the arm and hand and also had some mild chest discomfort.  Blood pressures went up to 180/108.  Symptoms subsided after 10 to 20 minutes and her systolic blood pressure came down to 130s.  This is the first time she ever had any chest discomfort and it occurred at rest and not with exertion.  Overall symptom is somewhat atypical.  She did not seek medical attention.  She is hesitant to proceed with GI procedure with elevated blood pressure, therefore canceled and rescheduled to GI procedure to August 25, 2021.  Dr. Bettina Gavia, please review, what is your recommendation regarding her cardiac clearance.  Do you recommend we try to bring her back for earlier office visit to evaluate her blood pressure in the office and then try to clear her at that time?  Please forward your response to P CV DIV PREOP

## 2021-08-03 ENCOUNTER — Ambulatory Visit (INDEPENDENT_AMBULATORY_CARE_PROVIDER_SITE_OTHER): Payer: Medicaid Other | Admitting: Cardiology

## 2021-08-03 ENCOUNTER — Other Ambulatory Visit: Payer: Self-pay

## 2021-08-03 ENCOUNTER — Encounter: Payer: Self-pay | Admitting: Cardiology

## 2021-08-03 VITALS — BP 132/80 | HR 104 | Ht 64.0 in | Wt 169.0 lb

## 2021-08-03 DIAGNOSIS — I4510 Unspecified right bundle-branch block: Secondary | ICD-10-CM | POA: Diagnosis not present

## 2021-08-03 DIAGNOSIS — Z01810 Encounter for preprocedural cardiovascular examination: Secondary | ICD-10-CM

## 2021-08-03 DIAGNOSIS — R000 Tachycardia, unspecified: Secondary | ICD-10-CM | POA: Diagnosis not present

## 2021-08-03 DIAGNOSIS — I10 Essential (primary) hypertension: Secondary | ICD-10-CM | POA: Diagnosis not present

## 2021-08-03 DIAGNOSIS — R079 Chest pain, unspecified: Secondary | ICD-10-CM

## 2021-08-03 NOTE — Patient Instructions (Signed)
Medication Instructions:  Your physician recommends that you continue on your current medications as directed. Please refer to the Current Medication list given to you today.  *If you need a refill on your cardiac medications before your next appointment, please call your pharmacy*   Lab Work: None If you have labs (blood work) drawn today and your tests are completely normal, you will receive your results only by: [place] (if you have MyChart) OR A paper copy in the mail If you have any lab test that is abnormal or we need to change your treatment, we will call you to review the results.   Testing/Procedures:   [facility] Cardiovascular Imaging at [facility] [address] [place], [place] [address] Phone: [phone]    Please arrive 15 minutes prior to your appointment time for registration and insurance purposes.  The test will take approximately 3 to 4 hours to complete; you may bring reading material.  If someone comes with you to your appointment, they will need to remain in the main lobby due to limited space in the testing area. **If you are pregnant or breastfeeding, please notify the nuclear lab prior to your appointment**  How to prepare for your Myocardial Perfusion Test: Do not eat or drink 3 hours prior to your test, except you may have water. Do not consume products containing caffeine (regular or decaffeinated) 12 hours prior to your test. (ex: coffee, chocolate, sodas, tea). Do bring a list of your current medications with you.  If not listed below, you may take your medications as normal.  Do wear comfortable clothes (no dresses or overalls) and walking shoes, tennis shoes preferred (No heels or open toe shoes are allowed). Do NOT wear cologne, perfume, aftershave, or lotions (deodorant is allowed). If these instructions are not followed, your test will have to be rescheduled.  Please report to [address] for  your test.  If you have questions or concerns about your appointment, you can call the Nuclear Lab at [phone].  If you cannot keep your appointment, please provide 24 hours notification to the Nuclear Lab, to avoid a possible $50 charge to your account.    Follow-Up: At [facility], you and your health needs are our priority.  As part of our continuing mission to provide you with exceptional heart care, we have created designated Provider Care Teams.  These Care Teams include your primary Cardiologist (physician) and Advanced Practice Providers (APPs -  Physician Assistants and Nurse Practitioners) who all work together to provide you with the care you need, when you need it.  We recommend signing up for the patient portal called "MyChart".  Sign up information is provided on this After Visit Summary.  MyChart is used to connect with patients for Virtual Visits (Telemedicine).  Patients are able to view lab/test results, encounter notes, upcoming appointments, etc.  Non-urgent messages can be sent to your provider as well.   To learn more about what you can do with MyChart, go to [email or web address].    Your next appointment:   3 month(s)  The format for your next appointment:   In Person  Provider:   Shirlee More, MD   Other Instructions  Cardiac Nuclear Scan A cardiac nuclear scan is a test that measures blood flow to the heart when a person is resting and when he or she is exercising. The test looks for problems such as: Not enough blood reaching a portion of the heart.  The heart muscle not working normally. You may need this test if: You have heart disease. You have had abnormal lab results. You have had heart surgery or a balloon procedure to open up blocked arteries (angioplasty). You have chest pain. You have shortness of breath. In this test, a radioactive dye (tracer) is injected into your bloodstream. After the tracer has traveled to your heart, an imaging  device is used to measure how much of the tracer is absorbed by or distributed to various areas of your heart. This procedure is usually done at a hospital and takes 2-4 hours. Tell a health care provider about: Any allergies you have. All medicines you are taking, including vitamins, herbs, eye drops, creams, and over-the-counter medicines. Any problems you or family members have had with anesthetic medicines. Any blood disorders you have. Any surgeries you have had. Any medical conditions you have. Whether you are pregnant or may be pregnant. What are the risks? Generally, this is a safe procedure. However, problems may occur, including: Serious chest pain and heart attack. This is only a risk if the stress portion of the test is done. Rapid heartbeat. Sensation of warmth in your chest. This usually passes quickly. Allergic reaction to the tracer. What happens before the procedure? Ask your health care provider about changing or stopping your regular medicines. This is especially important if you are taking diabetes medicines or blood thinners. Follow instructions from your health care provider about eating or drinking restrictions. Remove your jewelry on the day of the procedure. What happens during the procedure? An IV will be inserted into one of your veins. Your health care provider will inject a small amount of radioactive tracer through the IV. You will wait for 20-40 minutes while the tracer travels through your bloodstream. Your heart activity will be monitored with an electrocardiogram (ECG). You will lie down on an exam table. Images of your heart will be taken for about 15-20 minutes. You may also have a stress test. For this test, one of the following may be done: You will exercise on a treadmill or stationary bike. While you exercise, your heart's activity will be monitored with an ECG, and your blood pressure will be checked. You will be given medicines that will increase  blood flow to parts of your heart. This is done if you are unable to exercise. When blood flow to your heart has peaked, a tracer will again be injected through the IV. After 20-40 minutes, you will get back on the exam table and have more images taken of your heart. Depending on the type of tracer used, scans may need to be repeated 3-4 hours later. Your IV line will be removed when the procedure is over. The procedure may vary among health care providers and hospitals. What happens after the procedure? Unless your health care provider tells you otherwise, you may return to your normal schedule, including diet, activities, and medicines. Unless your health care provider tells you otherwise, you may increase your fluid intake. This will help to flush the contrast dye from your body. Drink enough fluid to keep your urine pale yellow. Ask your health care provider, or the department that is doing the test: When will my results be ready? How will I get my results? Summary A cardiac nuclear scan measures the blood flow to the heart when a person is resting and when he or she is exercising. Tell your health care provider if you are pregnant. Before the procedure, ask your  health care provider about changing or stopping your regular medicines. This is especially important if you are taking diabetes medicines or blood thinners. After the procedure, unless your health care provider tells you otherwise, increase your fluid intake. This will help flush the contrast dye from your body. After the procedure, unless your health care provider tells you otherwise, you may return to your normal schedule, including diet, activities, and medicines. This information is not intended to replace advice given to you by your health care provider. Make sure you discuss any questions you have with your health care provider. Document Revised: 03/25/2018 Document Reviewed: 03/25/2018 Elsevier Patient Education  [place].

## 2021-08-04 ENCOUNTER — Telehealth (HOSPITAL_COMMUNITY): Payer: Self-pay

## 2021-08-04 NOTE — Telephone Encounter (Signed)
Spoke with the patient, detailed instructions given. She stated that she would be here for her test. Asked to call back with any questions. S.Kathleen Tamm EMTP 

## 2021-08-09 ENCOUNTER — Other Ambulatory Visit: Payer: Self-pay

## 2021-08-09 ENCOUNTER — Ambulatory Visit (HOSPITAL_COMMUNITY): Payer: Medicaid Other | Attending: Internal Medicine

## 2021-08-09 ENCOUNTER — Ambulatory Visit: Payer: Medicaid Other | Admitting: Cardiology

## 2021-08-09 DIAGNOSIS — I4510 Unspecified right bundle-branch block: Secondary | ICD-10-CM | POA: Insufficient documentation

## 2021-08-09 DIAGNOSIS — I10 Essential (primary) hypertension: Secondary | ICD-10-CM | POA: Insufficient documentation

## 2021-08-09 DIAGNOSIS — Z01810 Encounter for preprocedural cardiovascular examination: Secondary | ICD-10-CM

## 2021-08-09 DIAGNOSIS — R000 Tachycardia, unspecified: Secondary | ICD-10-CM | POA: Diagnosis not present

## 2021-08-09 DIAGNOSIS — R079 Chest pain, unspecified: Secondary | ICD-10-CM | POA: Diagnosis not present

## 2021-08-09 LAB — MYOCARDIAL PERFUSION IMAGING
LV dias vol: 70 mL (ref 46–106)
LV sys vol: 31 mL
Nuc Stress EF: 55 %
Peak HR: 92 {beats}/min
Rest HR: 80 {beats}/min
Rest Nuclear Isotope Dose: 10.2 mCi
SDS: 1
SRS: 0
SSS: 1
ST Depression (mm): 0 mm
Stress Nuclear Isotope Dose: 30.1 mCi
TID: 0.96

## 2021-08-09 MED ORDER — TECHNETIUM TC 99M TETROFOSMIN IV KIT
10.2000 | PACK | Freq: Once | INTRAVENOUS | Status: AC | PRN
  Administered 2021-08-09: 10.2 via INTRAVENOUS
  Filled 2021-08-09: qty 11

## 2021-08-09 MED ORDER — TECHNETIUM TC 99M TETROFOSMIN IV KIT
30.1000 | PACK | Freq: Once | INTRAVENOUS | Status: AC | PRN
  Administered 2021-08-09: 30.1 via INTRAVENOUS
  Filled 2021-08-09: qty 31

## 2021-08-09 MED ORDER — REGADENOSON 0.4 MG/5ML IV SOLN
0.4000 mg | Freq: Once | INTRAVENOUS | Status: AC
  Administered 2021-08-09: 0.4 mg via INTRAVENOUS

## 2021-08-10 ENCOUNTER — Telehealth: Payer: Self-pay

## 2021-08-10 NOTE — Telephone Encounter (Signed)
-----   Message from Richardo Priest, MD sent at 08/10/2021  7:49 AM EDT ----- Normal or stable result  Proceed with planned EGD

## 2021-08-10 NOTE — Telephone Encounter (Signed)
Spoke with patient regarding results and recommendation.  Patient verbalizes understanding and is agreeable to plan of care. Advised patient to call back with any issues or concerns.  

## 2021-08-12 ENCOUNTER — Ambulatory Visit: Payer: Medicaid Other | Admitting: Family Medicine

## 2021-08-14 ENCOUNTER — Other Ambulatory Visit: Payer: Self-pay | Admitting: Medical

## 2021-08-15 NOTE — Telephone Encounter (Signed)
Notes faxed to surgeon. This phone note will be removed from the preop pool. Richardson Dopp, PA-C  08/15/2021 12:54 PM

## 2021-08-15 NOTE — Telephone Encounter (Signed)
   Name: Shirley [name] DOB: 03/28/1976  MRN: [id number]  Primary Cardiologist: None  Chart reviewed as part of pre-operative protocol coverage.   The patient was evaluated in the office by Dr. Bettina Gavia on 08/03/21.  Stress test was requested prior to clearing patient for the procedure.  GATED SPECT MYO PERF W/LEXISCAN STRESS 1D 08/09/2021 Narrative   Lexiscan stress is electrically negative for ischemia   Myoview shows normal perfusion  No ischemia or scar   LVEF on gating is 55% with normal wall motion.   Overall Low risk scan.   Per Dr. Bettina Gavia, the pt can proceed with EGD as planned.   Recommendations: Based on ACC/AHA guidelines, the patient is at acceptable risk for the planned procedure and may proceed without further cardiovascular testing.    Please call with questions. Richardson Dopp, PA-C 08/15/2021, 12:49 PM

## 2021-08-24 ENCOUNTER — Ambulatory Visit: Payer: Medicaid Other | Admitting: Medical

## 2021-08-30 ENCOUNTER — Ambulatory Visit (INDEPENDENT_AMBULATORY_CARE_PROVIDER_SITE_OTHER): Payer: Medicaid Other | Admitting: Family Medicine

## 2021-08-30 ENCOUNTER — Ambulatory Visit: Payer: Self-pay

## 2021-08-30 ENCOUNTER — Other Ambulatory Visit: Payer: Self-pay

## 2021-08-30 ENCOUNTER — Encounter: Payer: Self-pay | Admitting: Family Medicine

## 2021-08-30 ENCOUNTER — Ambulatory Visit (HOSPITAL_BASED_OUTPATIENT_CLINIC_OR_DEPARTMENT_OTHER)
Admission: RE | Admit: 2021-08-30 | Discharge: 2021-08-30 | Disposition: A | Discharge status: Home / Self Care | Payer: Medicaid Other | Source: Ambulatory Visit | Attending: Family Medicine | Admitting: Family Medicine

## 2021-08-30 VITALS — BP 128/86 | Ht 64.0 in | Wt 169.0 lb

## 2021-08-30 DIAGNOSIS — M7042 Prepatellar bursitis, left knee: Secondary | ICD-10-CM

## 2021-08-30 DIAGNOSIS — M5416 Radiculopathy, lumbar region: Secondary | ICD-10-CM

## 2021-08-30 DIAGNOSIS — M25562 Pain in left knee: Secondary | ICD-10-CM | POA: Diagnosis not present

## 2021-08-30 DIAGNOSIS — M7652 Patellar tendinitis, left knee: Secondary | ICD-10-CM

## 2021-08-30 DIAGNOSIS — S8002XA Contusion of left knee, initial encounter: Secondary | ICD-10-CM

## 2021-08-30 HISTORY — DX: Radiculopathy, lumbar region: M54.16

## 2021-08-30 HISTORY — DX: Patellar tendinitis, left knee: M76.52

## 2021-08-30 HISTORY — DX: Prepatellar bursitis, left knee: M70.42

## 2021-08-30 NOTE — Assessment & Plan Note (Signed)
Acutely occurring after her injury while at [facility] on 11/7 -Counseled on home exercise therapy and supportive care. -Counseled on gabapentin. -Could consider physical therapy.

## 2021-08-30 NOTE — Assessment & Plan Note (Signed)
Has thickening of the bursa following her injury at [facility] on 11/7. -Counseled on home exercise therapy and supportive care. -Could consider physical therapy.

## 2021-08-30 NOTE — Assessment & Plan Note (Signed)
Increased hyperemia observed on ultrasound following her injury while at [facility] on 11/70. -Counseled on home exercise therapy and supportive care. -Counseled on meloxicam. -Patellar strap. -X-ray.

## 2021-08-30 NOTE — Progress Notes (Signed)
Shirley [name] - 45 y.o. female MRN [id number]  Date of birth: 11/05/1975  SUBJECTIVE:  Including CC & ROS.  No chief complaint on file.   Shirley [name] is a 45 y.o. female that is presenting with acute left knee pain after she had a fall onto the patella while at [facility].  Having pain at the lower leg and knee.   Review of Systems See HPI   HISTORY: Past Medical, Surgical, Social, and Family History Reviewed & Updated per EMR.   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   ANKLE PAIN, LEFT 06/26/2008   BACK PAIN, LUMBAR 10/15/2007   BIPOLAR DISORDER UNSPECIFIED 09/13/2007   CERVICAL RADICULOPATHY, LEFT 06/26/2008   Cervicalgia 10/15/2007   Chronic pain syndrome 03/21/2011   COMMON MIGRAINE 06/26/2008   DIABETES MELLITUS, TYPE II 06/05/2007   DYSPNEA 12/05/2007   EAR PAIN, RIGHT 12/05/2007   ELEVATED BP READING WITHOUT DX HYPERTENSION 07/29/2009   FATIGUE 12/05/2007   FIBROMYALGIA 06/26/2008   GASTROENTERITIS, ACUTE 12/15/2008   GERD 06/26/2008   Headache(784.0) 09/13/2007   HYPERLIPIDEMIA 09/13/2007   JOINT EFFUSION, RIGHT KNEE 07/29/2009   KNEE PAIN, LEFT 11/21/2010   LUMBAR RADICULOPATHY, LEFT 06/26/2008   OTHER DISEASE OF PHARYNX OR NASOPHARYNX 07/03/2008   PERIPHERAL EDEMA 01/06/2009   POLYARTHRITIS 11/21/2010   SHOULDER PAIN, BILATERAL 07/03/2008   SINUSITIS- ACUTE-NOS 07/29/2009   TACHYCARDIA 12/05/2007   THYROID NODULE, RIGHT 11/20/2008   TREMOR 01/02/2008   Vertigo     Past Surgical History:  Procedure Laterality Date   back surgury     lumbar 2001   CESAREAN SECTION     x 3   thyroid fine needle aspiration  May 2008   showed non neoplastic goiter   VAGINAL HYSTERECTOMY     fibroids    Family History  Problem Relation Age of Onset   Thyroid disease Mother    Diabetes Mother    Asthma Mother     Hypertension Father    Hyperlipidemia Father    Diabetes Father    Emphysema Other    Coronary artery disease Other    Cancer Other        pancreatic and breast cancer   Cancer Other        lung and prostate cancer   Breast cancer Neg Hx     Social History   Socioeconomic History   Marital status: Significant Other    Spouse name: Not on file   Number of children: 3   Years of education: Not on file   Highest education level: Not on file  Occupational History    Employer: UNEMPLOYED  Tobacco Use   Smoking status: Never   Smokeless tobacco: Never  Vaping Use   Vaping Use: Never used  Substance and Sexual Activity   Alcohol use: No   Drug use: No   Sexual activity: Not on file  Other Topics Concern   Not on file  Social History Narrative   Not on file   Social Determinants of Health   Financial Resource Strain: Not on file  Food Insecurity: Not on file  Transportation Needs: Not on file  Physical Activity: Not on file  Stress: Not on file  Social Connections: Not on file  Intimate Partner Violence: Not on file     PHYSICAL EXAM:  VS: BP 128/86 (BP  Location: Left Arm, Patient Position: Sitting)   Ht 5\' 4"  (1.626 m)   Wt 169 lb (76.7 kg)   BMI 29.01 kg/m  Physical Exam Gen: NAD, alert, cooperative with exam, well-appearing   Limited ultrasound: Left knee:  No effusion of the suprapatellar pouch. Mild bursitis of the prepatellar bursa. Increased hyperemia at the spurt of the inferior pole of patella  Summary: Prepatellar bursitis patellar tendinitis  Ultrasound and interpretation by Clearance Coots, MD     ASSESSMENT & PLAN:   Lumbar radiculopathy Acutely occurring after her injury while at [facility] on 11/7 -Counseled on home exercise therapy and supportive care. -Counseled on gabapentin. -Could consider physical therapy.   Patellar tendinitis of left knee Increased hyperemia observed on ultrasound following her injury while at [facility] on  11/70. -Counseled on home exercise therapy and supportive care. -Counseled on meloxicam. -Patellar strap. -X-ray.   Prepatellar bursitis of left knee Has thickening of the bursa following her injury at [facility] on 11/7. -Counseled on home exercise therapy and supportive care. -Could consider physical therapy.

## 2021-08-30 NOTE — Patient Instructions (Addendum)
Good to see you Please use ice as needed   Please try the brace  I will call with the results from today  Please send me a message in MyChart with any questions or updates.  Please see me back in 2-3 weeks.   --Dr. Raeford Razor

## 2021-08-31 ENCOUNTER — Telehealth: Payer: Self-pay | Admitting: Family Medicine

## 2021-08-31 NOTE — Telephone Encounter (Signed)
Informed of results.   Rosemarie Ax, MD [facility] 08/31/2021, 3:37 PM

## 2021-09-20 ENCOUNTER — Ambulatory Visit (INDEPENDENT_AMBULATORY_CARE_PROVIDER_SITE_OTHER): Payer: Medicaid Other | Admitting: Family Medicine

## 2021-09-20 ENCOUNTER — Encounter: Payer: Self-pay | Admitting: Family Medicine

## 2021-09-20 VITALS — BP 118/88 | Ht 64.0 in | Wt 169.0 lb

## 2021-09-20 DIAGNOSIS — M7652 Patellar tendinitis, left knee: Secondary | ICD-10-CM

## 2021-09-20 DIAGNOSIS — M7042 Prepatellar bursitis, left knee: Secondary | ICD-10-CM

## 2021-09-20 NOTE — Patient Instructions (Signed)
Good to see you Please try physical therapy  Please let me know if you want to try shockwave therapy   Please send me a message in MyChart with any questions or updates.  Please see me back in 4 weeks.   --Dr. Raeford Razor

## 2021-09-20 NOTE — Assessment & Plan Note (Signed)
Occurred after an injury at [facility].  Still having pain that is worse with going up and down stairs. -Counseled on home exercise therapy and supportive care. -Referral to physical therapy.

## 2021-09-20 NOTE — Assessment & Plan Note (Signed)
Injury occurred after an injury at [facility].  No improvement with the patellar strap. -Counseled on home exercise therapy and supportive care. -Could consider shockwave. -Referral to physical therapy.

## 2021-09-20 NOTE — Progress Notes (Signed)
Shirley [name] - 45 y.o. female MRN [id number]  Date of birth: Oct 06, 1976  SUBJECTIVE:  Including CC & ROS.  No chief complaint on file.   Shirley [name] is a 45 y.o. female that is following up for her left knee pain after an injury sustained at [facility].  Still having ongoing pain that is worse with flexion as well as going up and down stairs.    Review of Systems See HPI   HISTORY: Past Medical, Surgical, Social, and Family History Reviewed & Updated per EMR.   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   ANKLE PAIN, LEFT 06/26/2008   BACK PAIN, LUMBAR 10/15/2007   BIPOLAR DISORDER UNSPECIFIED 09/13/2007   CERVICAL RADICULOPATHY, LEFT 06/26/2008   Cervicalgia 10/15/2007   Chronic pain syndrome 03/21/2011   COMMON MIGRAINE 06/26/2008   DIABETES MELLITUS, TYPE II 06/05/2007   DYSPNEA 12/05/2007   EAR PAIN, RIGHT 12/05/2007   ELEVATED BP READING WITHOUT DX HYPERTENSION 07/29/2009   FATIGUE 12/05/2007   FIBROMYALGIA 06/26/2008   GASTROENTERITIS, ACUTE 12/15/2008   GERD 06/26/2008   Headache(784.0) 09/13/2007   HYPERLIPIDEMIA 09/13/2007   JOINT EFFUSION, RIGHT KNEE 07/29/2009   KNEE PAIN, LEFT 11/21/2010   LUMBAR RADICULOPATHY, LEFT 06/26/2008   OTHER DISEASE OF PHARYNX OR NASOPHARYNX 07/03/2008   PERIPHERAL EDEMA 01/06/2009   POLYARTHRITIS 11/21/2010   SHOULDER PAIN, BILATERAL 07/03/2008   SINUSITIS- ACUTE-NOS 07/29/2009   TACHYCARDIA 12/05/2007   THYROID NODULE, RIGHT 11/20/2008   TREMOR 01/02/2008   Vertigo     Past Surgical History:  Procedure Laterality Date   back surgury     lumbar 2001   CESAREAN SECTION     x 3   thyroid fine needle aspiration  May 2008   showed non neoplastic goiter   VAGINAL HYSTERECTOMY     fibroids    Family History  Problem Relation Age of Onset   Thyroid disease Mother     Diabetes Mother    Asthma Mother    Hypertension Father    Hyperlipidemia Father    Diabetes Father    Emphysema Other    Coronary artery disease Other    Cancer Other        pancreatic and breast cancer   Cancer Other        lung and prostate cancer   Breast cancer Neg Hx     Social History   Socioeconomic History   Marital status: Significant Other    Spouse name: Not on file   Number of children: 3   Years of education: Not on file   Highest education level: Not on file  Occupational History    Employer: UNEMPLOYED  Tobacco Use   Smoking status: Never   Smokeless tobacco: Never  Vaping Use   Vaping Use: Never used  Substance and Sexual Activity   Alcohol use: No   Drug use: No   Sexual activity: Not on file  Other Topics Concern   Not on file  Social History Narrative   Not on file   Social Determinants of Health   Financial Resource Strain: Not on file  Food Insecurity: Not on file  Transportation Needs: Not on file  Physical Activity: Not on file  Stress: Not on file  Social Connections: Not on file  Intimate Partner Violence: Not on file     PHYSICAL  EXAM:  VS: BP 118/88 (BP Location: Left Arm, Patient Position: Sitting)   Ht 5\' 4"  (1.626 m)   Wt 169 lb (76.7 kg)   BMI 29.01 kg/m  Physical Exam Gen: NAD, alert, cooperative with exam, well-appearing     ASSESSMENT & PLAN:   Patellar tendinitis of left knee Injury occurred after an injury at [facility].  No improvement with the patellar strap. -Counseled on home exercise therapy and supportive care. -Could consider shockwave. -Referral to physical therapy.  Prepatellar bursitis of left knee Occurred after an injury at [facility].  Still having pain that is worse with going up and down stairs. -Counseled on home exercise therapy and supportive care. -Referral to physical therapy.

## 2021-09-21 ENCOUNTER — Ambulatory Visit: Payer: Medicaid Other | Admitting: Family Medicine

## 2021-10-02 ENCOUNTER — Other Ambulatory Visit: Payer: Self-pay | Admitting: Family Medicine

## 2021-10-02 DIAGNOSIS — M7661 Achilles tendinitis, right leg: Secondary | ICD-10-CM

## 2021-10-02 DIAGNOSIS — M722 Plantar fascial fibromatosis: Secondary | ICD-10-CM

## 2021-10-12 ENCOUNTER — Ambulatory Visit: Payer: Medicaid Other | Admitting: Physical Therapy

## 2021-10-19 ENCOUNTER — Ambulatory Visit: Payer: Medicaid Other | Admitting: Physical Therapy

## 2021-10-20 ENCOUNTER — Encounter: Payer: Self-pay | Admitting: Family Medicine

## 2021-10-20 ENCOUNTER — Other Ambulatory Visit (HOSPITAL_COMMUNITY)
Admission: RE | Admit: 2021-10-20 | Discharge: 2021-10-20 | Disposition: A | Discharge status: Home / Self Care | Payer: Medicaid Other | Source: Ambulatory Visit | Attending: Family Medicine | Admitting: Family Medicine

## 2021-10-20 ENCOUNTER — Other Ambulatory Visit: Payer: Self-pay

## 2021-10-20 ENCOUNTER — Ambulatory Visit (INDEPENDENT_AMBULATORY_CARE_PROVIDER_SITE_OTHER): Payer: Medicaid Other | Admitting: Family Medicine

## 2021-10-20 VITALS — BP 156/92 | HR 101 | Wt 204.0 lb

## 2021-10-20 DIAGNOSIS — B3731 Acute candidiasis of vulva and vagina: Secondary | ICD-10-CM | POA: Diagnosis not present

## 2021-10-20 DIAGNOSIS — S01512A Laceration without foreign body of oral cavity, initial encounter: Secondary | ICD-10-CM | POA: Diagnosis not present

## 2021-10-20 DIAGNOSIS — E119 Type 2 diabetes mellitus without complications: Secondary | ICD-10-CM | POA: Diagnosis not present

## 2021-10-20 MED ORDER — FLUCONAZOLE 150 MG PO TABS
150.0000 mg | ORAL_TABLET | ORAL | 3 refills | Status: DC
Start: 2021-10-20 — End: 2021-12-20

## 2021-10-20 MED ORDER — PREMARIN 0.625 MG/GM VA CREA: TOPICAL_CREAM | Freq: Every day | VAGINAL | 12 refills | Status: DC

## 2021-10-20 MED ORDER — PROGESTERONE 200 MG PO CAPS: 200.0000 mg | ORAL_CAPSULE | Freq: Every day | ORAL | 3 refills | Status: DC

## 2021-10-20 NOTE — Progress Notes (Signed)
° °  Subjective:    Patient ID: Shirley [name], female    DOB: 1976/08/23, 45 y.o.   MRN: [id number]  HPI  Patient seen for recurrent yeast infection.  She is diabetic and has been working on her hemoglobin A1c.  Hemoglobin A1c was done in September and was 11.1.  She is on Januvia.  She is having a lot of vaginal itching.  Additionally, she had a skin tear in the anterior fourchette on the right side.  This happened a few days ago.  She ran out of her prescription for HRT.  But has found that this was helpful.  Review of Systems     Objective:   Physical Exam Vitals reviewed. Exam conducted with a chaperone present.  Constitutional:      Appearance: Normal appearance.  Genitourinary:    Labia:        Right: No rash or tenderness.        Left: No rash or tenderness.      Vagina: No signs of injury. Vaginal discharge present.       Comments: Quite vaginal discharge Neurological:     Mental Status: She is alert.      Assessment & Plan:  1. Yeast vaginitis Diflucan150 mg every 3 days x 4 doses.  Patient may need refill if her hemoglobin A1c is continuing to be elevated.  I did recommend getting A1c today, but patient declined stating that she will see her primary care doctor soon - Cervicovaginal ancillary only( [place])  2. Laceration of labial mucosa without complication, initial encounter Appears to be healing.  Recommended restarting HRT as this is helped with lacerations in the past  3. Type 2 diabetes mellitus without complication, without long-term current use of insulin (HCC) On Januvia. Recommended good control of diabetes as this will help prevent recurrent vaginal infections.

## 2021-10-21 MED ORDER — PREMARIN 0.625 MG/GM VA CREA: 1.0000 | TOPICAL_CREAM | Freq: Every day | VAGINAL | 12 refills | Status: DC

## 2021-10-21 NOTE — Addendum Note (Signed)
Addended by: Truett Mainland on: 10/21/2021 12:42 PM   Modules accepted: Orders

## 2021-10-25 ENCOUNTER — Ambulatory Visit: Payer: Medicaid Other | Admitting: Cardiology

## 2021-10-25 LAB — CERVICOVAGINAL ANCILLARY ONLY
Candida Glabrata: POSITIVE — AB
Candida Vaginitis: NEGATIVE
Comment: NEGATIVE
Comment: NEGATIVE
Comment: NEGATIVE
Trichomonas: NEGATIVE

## 2021-10-26 ENCOUNTER — Ambulatory Visit: Payer: Self-pay | Admitting: Physical Therapy

## 2021-10-27 ENCOUNTER — Ambulatory Visit (INDEPENDENT_AMBULATORY_CARE_PROVIDER_SITE_OTHER): Payer: Medicaid Other | Admitting: Family Medicine

## 2021-10-27 ENCOUNTER — Ambulatory Visit: Payer: Self-pay

## 2021-10-27 ENCOUNTER — Encounter: Payer: Self-pay | Admitting: Family Medicine

## 2021-10-27 ENCOUNTER — Encounter: Payer: Medicaid Other | Admitting: Physical Therapy

## 2021-10-27 VITALS — BP 118/80 | Ht 64.0 in | Wt 204.0 lb

## 2021-10-27 DIAGNOSIS — M722 Plantar fascial fibromatosis: Secondary | ICD-10-CM

## 2021-10-27 DIAGNOSIS — M25571 Pain in right ankle and joints of right foot: Secondary | ICD-10-CM

## 2021-10-27 DIAGNOSIS — M7661 Achilles tendinitis, right leg: Secondary | ICD-10-CM | POA: Diagnosis not present

## 2021-10-27 MED ORDER — NITROGLYCERIN 0.2 MG/HR TD PT24: MEDICATED_PATCH | TRANSDERMAL | 11 refills | Status: DC

## 2021-10-27 NOTE — Patient Instructions (Signed)
Good to see you Please try ice  Please try the exercise  Please try the nitro   Please send me a message in MyChart with any questions or updates.  Please see me back in 4-6 weeks.   --Dr. Raeford Razor  Nitroglycerin Protocol  Apply 1/4 nitroglycerin patch to affected area daily. Change position of patch within the affected area every 24 hours. You may experience a headache during the first 1-2 weeks of using the patch, these should subside. If you experience headaches after beginning nitroglycerin patch treatment, you may take your preferred over the counter pain reliever. Another side effect of the nitroglycerin patch is skin irritation or rash related to patch adhesive. Please notify our office if you develop more severe headaches or rash, and stop the patch. Tendon healing with nitroglycerin patch may require 12 to 24 weeks depending on the extent of injury. Men should not use if taking Viagra, Cialis, or Levitra.  Do not use if you have migraines or rosacea.

## 2021-10-27 NOTE — Progress Notes (Signed)
Shirley [name] - 46 y.o. female MRN [id number]  Date of birth: [birth year]  SUBJECTIVE:  Including CC & ROS.  No chief complaint on file.   Shirley [name] is a 46 y.o. female that is presenting with acute right foot pain.  The pain is occurring on the plantar aspect as well as the posterior ankle.  Started after she had a recent pain after stepping up onto a ledge.  Pain has been severe and has limited mobility.    Review of Systems See HPI   HISTORY: Past Medical, Surgical, Social, and Family History Reviewed & Updated per EMR.   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   ANKLE PAIN, LEFT 06/26/2008   BACK PAIN, LUMBAR 10/15/2007   BIPOLAR DISORDER UNSPECIFIED 09/13/2007   CERVICAL RADICULOPATHY, LEFT 06/26/2008   Cervicalgia 10/15/2007   Chronic pain syndrome 03/21/2011   COMMON MIGRAINE 06/26/2008   DIABETES MELLITUS, TYPE II 06/05/2007   DYSPNEA 12/05/2007   EAR PAIN, RIGHT 12/05/2007   ELEVATED BP READING WITHOUT DX HYPERTENSION 07/29/2009   FATIGUE 12/05/2007   FIBROMYALGIA 06/26/2008   GASTROENTERITIS, ACUTE 12/15/2008   GERD 06/26/2008   Headache(784.0) 09/13/2007   HYPERLIPIDEMIA 09/13/2007   JOINT EFFUSION, RIGHT KNEE 07/29/2009   KNEE PAIN, LEFT 11/21/2010   LUMBAR RADICULOPATHY, LEFT 06/26/2008   OTHER DISEASE OF PHARYNX OR NASOPHARYNX 07/03/2008   PERIPHERAL EDEMA 01/06/2009   POLYARTHRITIS 11/21/2010   SHOULDER PAIN, BILATERAL 07/03/2008   SINUSITIS- ACUTE-NOS 07/29/2009   TACHYCARDIA 12/05/2007   THYROID NODULE, RIGHT 11/20/2008   TREMOR 01/02/2008   Vertigo     Past Surgical History:  Procedure Laterality Date   back surgury     lumbar 2001   CESAREAN SECTION     x 3   thyroid fine needle aspiration  May 2008   showed non neoplastic goiter   VAGINAL HYSTERECTOMY     fibroids    Family  History  Problem Relation Age of Onset   Thyroid disease Mother    Diabetes Mother    Asthma Mother    Hypertension Father    Hyperlipidemia Father    Diabetes Father    Emphysema Other    Coronary artery disease Other    Cancer Other        pancreatic and breast cancer   Cancer Other        lung and prostate cancer   Breast cancer Neg Hx     Social History   Socioeconomic History   Marital status: Significant Other    Spouse name: Not on file   Number of children: 3   Years of education: Not on file   Highest education level: Not on file  Occupational History    Employer: UNEMPLOYED  Tobacco Use   Smoking status: Never   Smokeless tobacco: Never  Vaping Use   Vaping Use: Never used  Substance and Sexual Activity   Alcohol use: No   Drug use: No   Sexual activity: Not on file  Other Topics Concern   Not on file  Social History Narrative   Not on file   Social Determinants of Health   Financial Resource Strain: Not on file  Food Insecurity: Not on file  Transportation Needs: Not on file  Physical Activity: Not on file  Stress: Not on file  Social Connections: Not on  file  Intimate Partner Violence: Not on file     PHYSICAL EXAM:  VS: BP 118/80 (BP Location: Left Arm, Patient Position: Sitting)    Ht 5\' 4"  (1.626 m)    Wt 204 lb (92.5 kg)    BMI 35.02 kg/m  Physical Exam Gen: NAD, alert, cooperative with exam, well-appearing  Limited ultrasound: Right foot and ankle:  Chronic changes of the plantar fascia with thickening and hypoechoic change.  Calcaneal posterior spurring with hypoechoic change of the insertional achilles.  No changes of the posterior tibialis   Summary: chronic changes of the plantar fascia and achilles tendon  Ultrasound and interpretation by Clearance Coots, MD    ASSESSMENT & PLAN:   Plantar fasciitis, right Acute on chronic in nature. Recent injury seems to have exacerbated.  - counseled on home exercise therapy and  supportive care - could consider injection or physical therapy  Achilles tendinitis of right lower extremity Acute on chronic in nature. Recent exacerbation with injury  - counseled on home exercise therapy and supportive care. -Nitro patches. -Could consider shockwave or physical therapy

## 2021-10-27 NOTE — Assessment & Plan Note (Signed)
Acute on chronic in nature. Recent exacerbation with injury  - counseled on home exercise therapy and supportive care. -Nitro patches. -Could consider shockwave or physical therapy

## 2021-10-27 NOTE — Assessment & Plan Note (Signed)
Acute on chronic in nature. Recent injury seems to have exacerbated.  - counseled on home exercise therapy and supportive care - could consider injection or physical therapy

## 2021-11-04 ENCOUNTER — Encounter: Payer: Medicaid Other | Admitting: Physical Therapy

## 2021-11-08 ENCOUNTER — Other Ambulatory Visit: Payer: Self-pay

## 2021-11-08 ENCOUNTER — Encounter: Payer: Self-pay | Admitting: Physical Therapy

## 2021-11-08 ENCOUNTER — Ambulatory Visit: Payer: Medicaid Other | Attending: Family Medicine | Admitting: Physical Therapy

## 2021-11-08 DIAGNOSIS — M25562 Pain in left knee: Secondary | ICD-10-CM | POA: Diagnosis not present

## 2021-11-08 DIAGNOSIS — R262 Difficulty in walking, not elsewhere classified: Secondary | ICD-10-CM | POA: Diagnosis not present

## 2021-11-08 DIAGNOSIS — M7652 Patellar tendinitis, left knee: Secondary | ICD-10-CM | POA: Diagnosis not present

## 2021-11-08 DIAGNOSIS — M6281 Muscle weakness (generalized): Secondary | ICD-10-CM

## 2021-11-08 DIAGNOSIS — M25662 Stiffness of left knee, not elsewhere classified: Secondary | ICD-10-CM

## 2021-11-08 DIAGNOSIS — M7042 Prepatellar bursitis, left knee: Secondary | ICD-10-CM | POA: Diagnosis not present

## 2021-11-08 DIAGNOSIS — R600 Localized edema: Secondary | ICD-10-CM | POA: Diagnosis not present

## 2021-11-08 NOTE — Therapy (Signed)
[facility] [address]  [place], [place], [address] Phone: [phone]   Fax:  [phone]  Physical Therapy Evaluation  Patient Details  Name: Shirley [name] MRN: [id number] Date of Birth: [birth year] Referring Provider (PT): Rosemarie Ax, MD   Encounter Date: 11/08/2021   PT End of Session - 11/08/21 0804     Visit Number 1    Number of Visits 13    Date for PT Re-Evaluation 12/20/21    Authorization Type [facility] Medicaid    PT Start Time 0804    PT Stop Time 0850    PT Time Calculation (min) 46 min    Activity Tolerance Patient tolerated treatment well    Behavior During Therapy [facility] for tasks assessed/performed             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   ANKLE PAIN, LEFT 06/26/2008   BACK PAIN, LUMBAR 10/15/2007   BIPOLAR DISORDER UNSPECIFIED 09/13/2007   CERVICAL RADICULOPATHY, LEFT 06/26/2008   Cervicalgia 10/15/2007   Chronic pain syndrome 03/21/2011   COMMON MIGRAINE 06/26/2008   DIABETES MELLITUS, TYPE II 06/05/2007   DYSPNEA 12/05/2007   EAR PAIN, RIGHT 12/05/2007   ELEVATED BP READING WITHOUT DX HYPERTENSION 07/29/2009   FATIGUE 12/05/2007   FIBROMYALGIA 06/26/2008   GASTROENTERITIS, ACUTE 12/15/2008   GERD 06/26/2008   Headache(784.0) 09/13/2007   HYPERLIPIDEMIA 09/13/2007   JOINT EFFUSION, RIGHT KNEE 07/29/2009   KNEE PAIN, LEFT 11/21/2010   LUMBAR RADICULOPATHY, LEFT 06/26/2008   OTHER DISEASE OF PHARYNX OR NASOPHARYNX 07/03/2008   PERIPHERAL EDEMA 01/06/2009   POLYARTHRITIS 11/21/2010   SHOULDER PAIN, BILATERAL 07/03/2008   SINUSITIS- ACUTE-NOS 07/29/2009   TACHYCARDIA 12/05/2007   THYROID NODULE, RIGHT 11/20/2008   TREMOR 01/02/2008   Vertigo     Past Surgical History:  Procedure Laterality Date   back surgury     lumbar 2001   CESAREAN  SECTION     x 3   thyroid fine needle aspiration  May 2008   showed non neoplastic goiter   VAGINAL HYSTERECTOMY     fibroids    There were no vitals filed for this visit.    Subjective Assessment - 11/08/21 0811     Subjective Pt reports she slipped and fell on water on the floor while shopping at [facility] in early November, landing on her L knee. When she went to crawl into bed that night she was unable to put weight on her knee. Also notes difficulty with walking up stairs. Pain has subsided but still has discomfort putting weight on her knee.    Limitations Standing;Walking;House hold activities    How long can you stand comfortably? a couple hours    How long can you walk comfortably? 30 minutes    Patient Stated Goals "To have better flexibility so I can squat comfortably w/o pain."    Currently in Pain? No/denies    Pain Score 0-No pain   2/10 when putting weight/pressure on anterior knee   Pain Location Knee    Pain Orientation Left    Pain Descriptors / Indicators Discomfort;Other (Comment)   "absent like something is missing"   Pain Type Acute pain    Pain Frequency Intermittent    Aggravating Factors  putting weight on anterior knee, squatting, stairs    Pain Relieving Factors meloxicam,  Voltaren gel, ice, elevation    Effect of Pain on Daily Activities difficulty squatting or kneeling on her knee - has to sit on a stool rather than kneel to get something off the floor                [facility] PT Assessment - 11/08/21 0804       Assessment   Medical Diagnosis L knee patellar tendinitis & prepatellar bursitis    Referring Provider (PT) Rosemarie Ax, MD    Onset Date/Surgical Date 08/29/21    Hand Dominance Left    Next MD Visit 12/09/21    Prior Therapy h/o PT for upper & lower back pain      Precautions   Precautions None      Restrictions   Weight Bearing Restrictions No      Balance Screen   Has the patient fallen in the past 6 months Yes    How many  times? 1 - at time of injury    Has the patient had a decrease in activity level because of a fear of falling?  Yes    Is the patient reluctant to leave their home because of a fear of falling?  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    Leisure "sit down"      Cognition   Overall Cognitive Status Within Functional Limits for tasks assessed      Observation/Other Assessments   Focus on Therapeutic Outcomes (FOTO)  Knee = 62; predicted D/C FS = 67      Sensation   Light Touch Appears Intact      Posture/Postural Control   Posture/Postural Control Postural limitations    Posture Comments B genu recurvatum      ROM / Strength   AROM / PROM / Strength AROM;PROM;Strength      AROM   AROM Assessment Site Knee    Right/Left Knee Right;Left    Right Knee Extension -6   in LAQ   Right Knee Flexion 131    Left Knee Extension -11   in LAQ   Left Knee Flexion 105      PROM   PROM Assessment Site Knee    Right/Left Knee Right;Left    Right Knee Flexion 9   hyperextension   Left Knee Flexion 6   hyperextension     Strength   Strength Assessment Site Hip;Knee;Ankle    Right/Left Hip Right;Left    Right Hip Flexion 4/5    Right Hip Extension 4+/5    Right Hip External Rotation  4/5    Right Hip Internal Rotation 4+/5    Right Hip ABduction 4/5    Right Hip ADduction 3+/5    Left Hip Flexion 4-/5    Left Hip Extension 4/5    Left Hip External Rotation 4-/5    Left Hip Internal Rotation 4/5    Left Hip ABduction 3+/5    Left Hip ADduction 3/5    Right/Left Knee Right;Left    Right Knee Flexion 4+/5    Right Knee Extension 4/5  Left Knee Flexion 4/5     Left Knee Extension 4-/5    Right/Left Ankle Right;Left    Right Ankle Dorsiflexion 4/5    Right Ankle Plantar Flexion --   20 SLS heel raises - limited lift   Left Ankle Dorsiflexion 4-/5    Left Ankle Plantar Flexion 4/5   10 SLS heel raises - limited lift     Flexibility   Soft Tissue Assessment /Muscle Length yes    Hamstrings mod tight L>R    Quadriceps mod tight B quads & hip flexors    ITB mod tight L>R    Piriformis mod tight B                        Objective measurements completed on examination: See above findings.                PT Education - 11/08/21 0845     Education Details PT eval findings and anticipated POC    Person(s) Educated Patient    Methods Explanation    Comprehension Verbalized understanding              PT Short Term Goals - 11/08/21 0850       PT SHORT TERM GOAL #1   Title Patient to be independent with initial HEP    Status New    Target Date 11/29/21               PT Long Term Goals - 11/08/21 0850       PT LONG TERM GOAL #1   Title Patient will be independent with ongoing/advanced HEP for self-management at home    Status New    Target Date 12/20/21      PT LONG TERM GOAL #2   Title Patient to demonstrate L knee AROM WFL and without pain limiting    Status New    Target Date 12/20/21      PT LONG TERM GOAL #3   Title Patient will stand with good quad control, avoiding genu recurvatum to reduce anterior knee pressure    Status New    Target Date 12/20/21      PT LONG TERM GOAL #4   Title Patient will demonstrate improved B LE strength to >/= 4+/5 for improved stability and ease of mobility    Status New    Target Date 12/20/21      PT LONG TERM GOAL #5   Title Patient to demonstrate lifting 10lb box from floor using squat with good body mechanics and no pain    Status New    Target Date 12/20/21      PT LONG TERM GOAL #6   Title Patient to report ability to perform ADLs,  household, and work-related tasks without limitation due to L knee pain, LOM or weakness    Status New    Target Date 12/20/21                    Plan - 11/08/21 0850     Clinical Impression Statement Karyme is a 46 y/o female who presents to OP PT for acute L knee pain from patellar tendinitis and prepatellar bursitis originating from a slip and fall on a wet floor while shopping at [place] on 08/29/21. She reports pain has subsided some with rest, ice, elevation and meds but still has difficulty squatting or putting weight on L knee when kneeling making it difficult to pick things  up from the floor or reach into low cabinets. Deficits include L knee pain, limited L knee AROM (11-105 although she has 6 of hyperextension with PROM extension with LE supported in supine), decreased proximal LE flexibility, L>R proximal LE weakness, and limited standing, squatting and kneeling tolerance. Ebbie will benefit from skilled PT intervention to address the above listed deficits, reduce pain, and improve functional L knee ROM and strength to allow for improved knee stability for improved balance and activity tolerance to maximize function and safety with mobility in home and community.    Personal Factors and Comorbidities Comorbidity 2;Fitness;Past/Current Experience;Time since onset of injury/illness/exacerbation;Profession    Comorbidities LBP, lumbar radiculopathy, thoracic/upper back pain, cervicalgia, R Achilles tendinitis, R plantar fasciitis, fibromyalgia, DM-II, GERD, OA    Examination-Activity Limitations Bend;Lift;Carry;Squat;Stand;Other   kneel   Examination-Participation Restrictions Cleaning;Community Activity;Occupation;Shop    Stability/Clinical Decision Making Stable/Uncomplicated    Clinical Decision Making Low    Rehab Potential Good    PT Frequency 2x / week    PT Duration 6 weeks    PT Treatment/Interventions ADLs/Self Care Home Management;Cryotherapy;Ultrasound;Gait  training;Stair training;Functional mobility training;Therapeutic activities;Therapeutic exercise;Balance training;Neuromuscular re-education;Manual techniques;Passive range of motion;Dry needling;Taping;Vasopneumatic Device;Joint Manipulations    PT Next Visit Plan Create initial HEP for proximal LE flexibility and strengthening; possible taping for patellar tendinitis/prepatellar bursitis    Consulted and Agree with Plan of Care Patient             Patient will benefit from skilled therapeutic intervention in order to improve the following deficits and impairments:  Decreased activity tolerance, Decreased balance, Decreased mobility, Decreased range of motion, Decreased strength, Difficulty walking, Increased edema, Increased fascial restricitons, Increased muscle spasms, Impaired perceived functional ability, Impaired flexibility, Improper body mechanics, Postural dysfunction, Pain  Visit Diagnosis: Acute pain of left knee  Stiffness of left knee, not elsewhere classified  Muscle weakness (generalized)  Difficulty in walking, not elsewhere classified  Localized edema     Problem List Patient Active Problem List   Diagnosis Date Noted   Patellar tendinitis of left knee 08/30/2021   Prepatellar bursitis of left knee 08/30/2021   Lumbar radiculopathy 08/30/2021   Achilles tendinitis of right lower extremity 04/07/2021   Plantar fasciitis, right 03/17/2021   Ankle impingement syndrome, right 03/01/2021   Metatarsalgia of right foot 03/01/2021   COVID-19 virus infection 11/24/2020   ADHD (attention deficit hyperactivity disorder)    Toe injury, left, initial encounter 05/22/2019   MRSA (methicillin resistant staph aureus) urine culture positive on 07/06/16 07/06/2016   Thoracic back pain 12/20/2015   Low back pain radiating to right leg 12/01/2015   Shoulder impingement syndrome, left 11/07/2013   Encounter for long-term (current) use of high-risk medication 05/29/2011    Chronic pain syndrome 03/21/2011   SINUSITIS- ACUTE-NOS 07/29/2009   JOINT EFFUSION, RIGHT KNEE 07/29/2009   ELEVATED BP READING WITHOUT DX HYPERTENSION 07/29/2009   THYROID NODULE, RIGHT 11/20/2008   GERD 06/26/2008   Fibromyalgia 06/26/2008   ANEMIA-IRON DEFICIENCY 06/26/2008   DYSPNEA 12/05/2007   Neck pain 10/15/2007   Cervicalgia 10/15/2007   Hyperlipidemia 09/13/2007   BIPOLAR DISORDER UNSPECIFIED 09/13/2007   Type 2 diabetes mellitus without complication, without long-term current use of insulin ([place]) 06/05/2007    Percival Spanish, PT 11/08/2021, 12:15 PM  [facility] [address]  [place], [place], [address] Phone: [phone]   Fax:  [phone]  Name: KRUTI HORACEK MRN: [id number] Date of Birth: [birth year]

## 2021-11-11 ENCOUNTER — Encounter: Payer: Self-pay | Admitting: Physical Therapy

## 2021-11-11 ENCOUNTER — Other Ambulatory Visit: Payer: Self-pay

## 2021-11-11 ENCOUNTER — Ambulatory Visit: Payer: Medicaid Other | Admitting: Physical Therapy

## 2021-11-11 DIAGNOSIS — M7042 Prepatellar bursitis, left knee: Secondary | ICD-10-CM | POA: Diagnosis not present

## 2021-11-11 DIAGNOSIS — M25562 Pain in left knee: Secondary | ICD-10-CM | POA: Diagnosis not present

## 2021-11-11 DIAGNOSIS — R600 Localized edema: Secondary | ICD-10-CM

## 2021-11-11 DIAGNOSIS — M25662 Stiffness of left knee, not elsewhere classified: Secondary | ICD-10-CM | POA: Diagnosis not present

## 2021-11-11 DIAGNOSIS — R262 Difficulty in walking, not elsewhere classified: Secondary | ICD-10-CM | POA: Diagnosis not present

## 2021-11-11 DIAGNOSIS — M6281 Muscle weakness (generalized): Secondary | ICD-10-CM

## 2021-11-11 DIAGNOSIS — M7652 Patellar tendinitis, left knee: Secondary | ICD-10-CM | POA: Diagnosis not present

## 2021-11-11 NOTE — Therapy (Signed)
[facility] [address]  [place], [place], [address] Phone: [phone]   Fax:  [phone]  Physical Therapy Treatment  Patient Details  Name: Shirley [name] MRN: [id number] Date of Birth: 04-22-1976 Referring Provider (PT): Rosemarie Ax, MD   Encounter Date: 11/11/2021   PT End of Session - 11/11/21 0810     Visit Number 2    Number of Visits 13    Date for PT Re-Evaluation 12/20/21    Authorization Type [employer]    PT Start Time 1856   Pt arrived late   PT Stop Time 0858    PT Time Calculation (min) 48 min    Activity Tolerance Patient tolerated treatment well    Behavior During Therapy [facility] for tasks assessed/performed             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   ANKLE PAIN, LEFT 06/26/2008   BACK PAIN, LUMBAR 10/15/2007   BIPOLAR DISORDER UNSPECIFIED 09/13/2007   CERVICAL RADICULOPATHY, LEFT 06/26/2008   Cervicalgia 10/15/2007   Chronic pain syndrome 03/21/2011   COMMON MIGRAINE 06/26/2008   DIABETES MELLITUS, TYPE II 06/05/2007   DYSPNEA 12/05/2007   EAR PAIN, RIGHT 12/05/2007   ELEVATED BP READING WITHOUT DX HYPERTENSION 07/29/2009   FATIGUE 12/05/2007   FIBROMYALGIA 06/26/2008   GASTROENTERITIS, ACUTE 12/15/2008   GERD 06/26/2008   Headache(784.0) 09/13/2007   HYPERLIPIDEMIA 09/13/2007   JOINT EFFUSION, RIGHT KNEE 07/29/2009   KNEE PAIN, LEFT 11/21/2010   LUMBAR RADICULOPATHY, LEFT 06/26/2008   OTHER DISEASE OF PHARYNX OR NASOPHARYNX 07/03/2008   PERIPHERAL EDEMA 01/06/2009   POLYARTHRITIS 11/21/2010   SHOULDER PAIN, BILATERAL 07/03/2008   SINUSITIS- ACUTE-NOS 07/29/2009   TACHYCARDIA 12/05/2007   THYROID NODULE, RIGHT 11/20/2008   TREMOR 01/02/2008   Vertigo     Past Surgical History:  Procedure Laterality Date   back surgury     lumbar 2001    CESAREAN SECTION     x 3   thyroid fine needle aspiration  May 2008   showed non neoplastic goiter   VAGINAL HYSTERECTOMY     fibroids    There were no vitals filed for this visit.   Subjective Assessment - 11/11/21 0812     Subjective No pain today but notes increased pain yesterday when trying to carry some bags up the stairs at a friend's house causing her to have to switch to step-to pattern.    Patient Stated Goals "To have better flexibility so I can squat comfortably w/o pain."    Currently in Pain? No/denies                               [facility] Adult PT Treatment/Exercise - 11/11/21 0810       Exercises   Exercises Knee/Hip      Knee/Hip Exercises: Stretches   Passive Hamstring Stretch Left;2 reps;30 seconds    Passive Hamstring Stretch Limitations hooklying with strap    Quad Stretch Left;2 reps;30 seconds    Quad Stretch Limitations prone with strap    Hip Flexor Stretch Left;2 reps;30 seconds    Hip Flexor Stretch Limitations mod thomas with strap    ITB Stretch Left;2 reps;30 seconds    ITB Stretch Limitations supine crossbody with strap  Piriformis Stretch Right;1 rep;30 seconds   each   Piriformis Stretch Limitations figure-4 with overpressure, KTOS & single leg figure-4 to chest - pt preferring the latter 2      Knee/Hip Exercises: Aerobic   Recumbent Bike L2 x 6 min      Knee/Hip Exercises: Supine   Hip Adduction Isometric Both;10 reps;Strengthening    Hip Adduction Isometric Limitations 5" hold ball squeeze    Bridges Both;10 reps;Strengthening    Bridges Limitations 5" hold + red TB hip ABD isometric    Bridges with Ball Squeeze Both;10 reps;Strengthening   5" hold   Straight Leg Raises Left;10 reps;Strengthening    Straight Leg Raises Limitations cues for QS prior to lift    Other Supine Knee/Hip Exercises Hooklying TrA + red TB alt hip ABD/ER clam 10 x 3"    Other Supine Knee/Hip Exercises Red TB brace march 10 x 3"                      PT Education - 11/11/21 0855     Education Details initial HEP - Access Code: [email or web address]    Person(s) Educated Patient    Methods Explanation;Demonstration;Verbal cues;Tactile cues;Handout    Comprehension Verbalized understanding;Verbal cues required;Tactile cues required;Returned demonstration;Need further instruction              PT Short Term Goals - 11/11/21 0814       PT SHORT TERM GOAL #1   Title Patient to be independent with initial HEP    Status On-going    Target Date 11/29/21               PT Long Term Goals - 11/11/21 0814       PT LONG TERM GOAL #1   Title Patient will be independent with ongoing/advanced HEP for self-management at home    Status On-going    Target Date 12/20/21      PT LONG TERM GOAL #2   Title Patient to demonstrate L knee AROM WFL and without pain limiting    Status On-going    Target Date 12/20/21      PT LONG TERM GOAL #3   Title Patient will stand with good quad control, avoiding genu recurvatum to reduce anterior knee pressure    Status On-going    Target Date 12/20/21      PT LONG TERM GOAL #4   Title Patient will demonstrate improved B LE strength to >/= 4+/5 for improved stability and ease of mobility    Status On-going    Target Date 12/20/21      PT LONG TERM GOAL #5   Title Patient to demonstrate lifting 10lb box from floor using squat with good body mechanics and no pain    Status On-going    Target Date 12/20/21      PT LONG TERM GOAL #6   Title Patient to report ability to perform ADLs, household, and work-related tasks without limitation due to L knee pain, LOM or weakness    Status On-going    Target Date 12/20/21                   Plan - 11/11/21 0814     Clinical Impression Statement Shirley [name] noting limited tolerance for carrying things up stairs due to L knee pain. She also notes propensity for abdominal cramping when she is carrying things which she attributes to  abdominal weakness from h/o emergency C-section which has left her  with residual numbness in her lower abdomen. Initial HEP created addressing proximal LE tightness identified on eval as well as lumbopelvic and proximal LE strengthening incorporating abdominal bracing to improve abdominal control. Pt able to perform good return demonstration of all stretches and exercises, noting some muscle fatigue/soreness but denying any pain.    Comorbidities LBP, lumbar radiculopathy, thoracic/upper back pain, cervicalgia, R Achilles tendinitis, R plantar fasciitis, fibromyalgia, DM-II, GERD, OA    Rehab Potential Good    PT Frequency 2x / week    PT Duration 6 weeks    PT Treatment/Interventions ADLs/Self Care Home Management;Cryotherapy;Ultrasound;Gait training;Stair training;Functional mobility training;Therapeutic activities;Therapeutic exercise;Balance training;Neuromuscular re-education;Manual techniques;Passive range of motion;Dry needling;Taping;Vasopneumatic Device;Joint Manipulations    PT Next Visit Plan Review initial HEP; progress proximal LE flexibility and lumbopelvic/LE strengthening; possible taping for patellar tendinitis/prepatellar bursitis    PT Home Exercise Plan Access Code: [email or web address]    Consulted and Agree with Plan of Care Patient             Patient will benefit from skilled therapeutic intervention in order to improve the following deficits and impairments:  Decreased activity tolerance, Decreased balance, Decreased mobility, Decreased range of motion, Decreased strength, Difficulty walking, Increased edema, Increased fascial restricitons, Increased muscle spasms, Impaired perceived functional ability, Impaired flexibility, Improper body mechanics, Postural dysfunction, Pain  Visit Diagnosis: Acute pain of left knee  Stiffness of left knee, not elsewhere classified  Muscle weakness (generalized)  Difficulty in walking, not elsewhere classified  Localized  edema     Problem List Patient Active Problem List   Diagnosis Date Noted   Patellar tendinitis of left knee 08/30/2021   Prepatellar bursitis of left knee 08/30/2021   Lumbar radiculopathy 08/30/2021   Achilles tendinitis of right lower extremity 04/07/2021   Plantar fasciitis, right 03/17/2021   Ankle impingement syndrome, right 03/01/2021   Metatarsalgia of right foot 03/01/2021   COVID-19 virus infection 11/24/2020   ADHD (attention deficit hyperactivity disorder)    Toe injury, left, initial encounter 05/22/2019   MRSA (methicillin resistant staph aureus) urine culture positive on 07/06/16 07/06/2016   Thoracic back pain 12/20/2015   Low back pain radiating to right leg 12/01/2015   Shoulder impingement syndrome, left 11/07/2013   Encounter for long-term (current) use of high-risk medication 05/29/2011   Chronic pain syndrome 03/21/2011   SINUSITIS- ACUTE-NOS 07/29/2009   JOINT EFFUSION, RIGHT KNEE 07/29/2009   ELEVATED BP READING WITHOUT DX HYPERTENSION 07/29/2009   THYROID NODULE, RIGHT 11/20/2008   GERD 06/26/2008   Fibromyalgia 06/26/2008   ANEMIA-IRON DEFICIENCY 06/26/2008   DYSPNEA 12/05/2007   Neck pain 10/15/2007   Cervicalgia 10/15/2007   Hyperlipidemia 09/13/2007   BIPOLAR DISORDER UNSPECIFIED 09/13/2007   Type 2 diabetes mellitus without complication, without long-term current use of insulin ([place]) 06/05/2007    Percival Spanish, PT 11/11/2021, 9:14 AM  [facility] [address]  [place], [place], [address] Phone: [phone]   Fax:  [phone]  Name: Shirley [name] MRN: [id number] Date of Birth: 1976-08-02

## 2021-11-11 NOTE — Patient Instructions (Signed)
° ° ° ° °  Access Code: [email or web address] URL: [email or web address] Date: 11/11/2021 Prepared by: Annie Paras  Exercises Hooklying Hamstring Stretch with Strap - 2-3 x daily - 7 x weekly - 3 reps - 30 sec hold Supine ITB Stretch with Strap - 2-3 x daily - 7 x weekly - 3 reps - 30 sec hold Supine Quadriceps Stretch with Strap on Table - 2-3 x daily - 7 x weekly - 3 reps - 30 sec hold Prone Quadriceps Stretch with Strap - 2-3 x daily - 7 x weekly - 3 reps - 30 sec hold Supine Piriformis Stretch with Foot on Ground - 2-3 x daily - 7 x weekly - 3 reps - 30 sec hold Supine Figure 4 Piriformis Stretch with Leg Extension - 2-3 x daily - 7 x weekly - 3 reps - 30 sec hold Supine Hip Adduction Isometric with Ball - 2 x daily - 7 x weekly - 2 sets - 10 reps - 5 sec hold Supine Bridge with Mini Swiss Ball Between Knees - 2 x daily - 7 x weekly - 2 sets - 10 reps - 5 sec hold Hooklying Isometric Clamshell - 1 x daily - 7 x weekly - 2 sets - 10 reps - 3 sec hold Bridge with Resistance - 1 x daily - 7 x weekly - 2 sets - 10 reps - 5 sec hold Supine March with Resistance Band - 1 x daily - 7 x weekly - 2 sets - 10 reps - 2-3 sec hold hold Active Straight Leg Raise with Quad Set - 1 x daily - 7 x weekly - 2 sets - 10 reps - 3-5 sec hold

## 2021-11-15 ENCOUNTER — Ambulatory Visit: Payer: Medicaid Other | Admitting: Physical Therapy

## 2021-11-18 ENCOUNTER — Other Ambulatory Visit: Payer: Self-pay

## 2021-11-18 ENCOUNTER — Encounter: Payer: Self-pay | Admitting: Physical Therapy

## 2021-11-18 ENCOUNTER — Ambulatory Visit: Payer: Medicaid Other | Admitting: Physical Therapy

## 2021-11-18 DIAGNOSIS — M25562 Pain in left knee: Secondary | ICD-10-CM

## 2021-11-18 DIAGNOSIS — M7652 Patellar tendinitis, left knee: Secondary | ICD-10-CM | POA: Diagnosis not present

## 2021-11-18 DIAGNOSIS — R600 Localized edema: Secondary | ICD-10-CM | POA: Diagnosis not present

## 2021-11-18 DIAGNOSIS — R262 Difficulty in walking, not elsewhere classified: Secondary | ICD-10-CM | POA: Diagnosis not present

## 2021-11-18 DIAGNOSIS — M7042 Prepatellar bursitis, left knee: Secondary | ICD-10-CM | POA: Diagnosis not present

## 2021-11-18 DIAGNOSIS — M6281 Muscle weakness (generalized): Secondary | ICD-10-CM

## 2021-11-18 DIAGNOSIS — M25662 Stiffness of left knee, not elsewhere classified: Secondary | ICD-10-CM | POA: Diagnosis not present

## 2021-11-18 NOTE — Therapy (Signed)
[facility] [address]  [place], [place], [address] Phone: [phone]   Fax:  [phone]  Physical Therapy Treatment  Patient Details  Name: Shirley [name] MRN: [id number] Date of Birth: 1976-06-03 Referring Provider (PT): Rosemarie Ax, MD   Encounter Date: 11/18/2021   PT End of Session - 11/18/21 0850     Visit Number 3    Number of Visits 13    Date for PT Re-Evaluation 12/20/21    Authorization Type [facility] Medicaid    PT Start Time 5009    PT Stop Time 0932    PT Time Calculation (min) 42 min    Activity Tolerance Patient tolerated treatment well    Behavior During Therapy [facility] for tasks assessed/performed             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   ANKLE PAIN, LEFT 06/26/2008   BACK PAIN, LUMBAR 10/15/2007   BIPOLAR DISORDER UNSPECIFIED 09/13/2007   CERVICAL RADICULOPATHY, LEFT 06/26/2008   Cervicalgia 10/15/2007   Chronic pain syndrome 03/21/2011   COMMON MIGRAINE 06/26/2008   DIABETES MELLITUS, TYPE II 06/05/2007   DYSPNEA 12/05/2007   EAR PAIN, RIGHT 12/05/2007   ELEVATED BP READING WITHOUT DX HYPERTENSION 07/29/2009   FATIGUE 12/05/2007   FIBROMYALGIA 06/26/2008   GASTROENTERITIS, ACUTE 12/15/2008   GERD 06/26/2008   Headache(784.0) 09/13/2007   HYPERLIPIDEMIA 09/13/2007   JOINT EFFUSION, RIGHT KNEE 07/29/2009   KNEE PAIN, LEFT 11/21/2010   LUMBAR RADICULOPATHY, LEFT 06/26/2008   OTHER DISEASE OF PHARYNX OR NASOPHARYNX 07/03/2008   PERIPHERAL EDEMA 01/06/2009   POLYARTHRITIS 11/21/2010   SHOULDER PAIN, BILATERAL 07/03/2008   SINUSITIS- ACUTE-NOS 07/29/2009   TACHYCARDIA 12/05/2007   THYROID NODULE, RIGHT 11/20/2008   TREMOR 01/02/2008   Vertigo     Past Surgical History:  Procedure Laterality Date   back surgury     lumbar 2001   CESAREAN  SECTION     x 3   thyroid fine needle aspiration  May 2008   showed non neoplastic goiter   VAGINAL HYSTERECTOMY     fibroids    There were no vitals filed for this visit.   Subjective Assessment - 11/18/21 0853     Subjective No pain today but notes increased pain yesterday when walking up steps with added weight (bags in her hands). Arrives to PT stating she feels "worn out" like she has already done all her exercises for the day.    Patient Stated Goals "To have better flexibility so I can squat comfortably w/o pain."    Currently in Pain? No/denies                               [facility] Adult PT Treatment/Exercise - 11/18/21 0850       Self-Care   Self-Care Other Self-Care Comments    Other Self-Care Comments  Kinesiotape application for knee support + wearing and removmal instructions      Exercises   Exercises Knee/Hip      Knee/Hip Exercises: Aerobic   Recumbent Bike L2 x 6 min      Knee/Hip Exercises: Standing   Terminal Knee Extension Left;20 reps;Strengthening;Theraband    Theraband Level (Terminal Knee Extension) Level 3 (Green)    Forward Step Up Left;5 reps;Hand Hold:  2;10 reps;Hand Hold: 1;Step Height: 4"    Forward Step Up Limitations green TB TKE added after ~5 reps with better tolerance and decreased knee pain      Manual Therapy   Manual Therapy Taping    Kinesiotex Create Space      Kinesiotix   Create Space KT Tape Full Knee Support pattern with red RockTape - PT application to L knee with pt performing return demonstration on R knee                     PT Education - 11/18/21 0919     Education Details Kinesiotape application, wearing and removal instructions; HEP update - green TB TKE    Person(s) Educated Patient    Methods Explanation;Demonstration;Verbal cues;Tactile cues;Handout    Comprehension Verbalized understanding;Verbal cues required;Tactile cues required;Returned demonstration;Need further instruction               PT Short Term Goals - 11/11/21 0814       PT SHORT TERM GOAL #1   Title Patient to be independent with initial HEP    Status On-going    Target Date 11/29/21               PT Long Term Goals - 11/11/21 0814       PT LONG TERM GOAL #1   Title Patient will be independent with ongoing/advanced HEP for self-management at home    Status On-going    Target Date 12/20/21      PT LONG TERM GOAL #2   Title Patient to demonstrate L knee AROM WFL and without pain limiting    Status On-going    Target Date 12/20/21      PT LONG TERM GOAL #3   Title Patient will stand with good quad control, avoiding genu recurvatum to reduce anterior knee pressure    Status On-going    Target Date 12/20/21      PT LONG TERM GOAL #4   Title Patient will demonstrate improved B LE strength to >/= 4+/5 for improved stability and ease of mobility    Status On-going    Target Date 12/20/21      PT LONG TERM GOAL #5   Title Patient to demonstrate lifting 10lb box from floor using squat with good body mechanics and no pain    Status On-going    Target Date 12/20/21      PT LONG TERM GOAL #6   Title Patient to report ability to perform ADLs, household, and work-related tasks without limitation due to L knee pain, LOM or weakness    Status On-going    Target Date 12/20/21                   Plan - 11/18/21 0932     Clinical Impression Statement Shirley [name] reports no issues with initial HEP and denies need for review today. She states she has purchased kinesiotape but does not know how to apply it. Demonstration of Ktape application performed using KT Tape Full Knee Support pattern with instructional handout including instructional video link provided for further reference at home - PT applying tape to L knee with pt performing return demonstration on R knee. Pt continues to identify stairs as triggering activity for her knee pain, therefor therapeutic exercises focusing on  promoting proper muscle activation and strengthening for improved knee stability with stair negotiation, with pt noting better tolerance for step-ups by end of session. She will continue to  benefit from further core and LE strengthening to further improve tolerance for functional activities.    Comorbidities LBP, lumbar radiculopathy, thoracic/upper back pain, cervicalgia, R Achilles tendinitis, R plantar fasciitis, fibromyalgia, DM-II, GERD, OA    Rehab Potential Good    PT Frequency 2x / week    PT Duration 6 weeks    PT Treatment/Interventions ADLs/Self Care Home Management;Cryotherapy;Ultrasound;Gait training;Stair training;Functional mobility training;Therapeutic activities;Therapeutic exercise;Balance training;Neuromuscular re-education;Manual techniques;Passive range of motion;Dry needling;Taping;Vasopneumatic Device;Joint Manipulations    PT Next Visit Plan Assess response to Ktape; review initial HEP as needed; progress proximal LE flexibility and lumbopelvic/LE strengthening; review of taping for patellar tendinitis/prepatellar bursitis PRN    PT Home Exercise Plan Access Code: [email or web address] (1/20, updated 1/27)    Consulted and Agree with Plan of Care Patient             Patient will benefit from skilled therapeutic intervention in order to improve the following deficits and impairments:  Decreased activity tolerance, Decreased balance, Decreased mobility, Decreased range of motion, Decreased strength, Difficulty walking, Increased edema, Increased fascial restricitons, Increased muscle spasms, Impaired perceived functional ability, Impaired flexibility, Improper body mechanics, Postural dysfunction, Pain  Visit Diagnosis: Acute pain of left knee  Stiffness of left knee, not elsewhere classified  Muscle weakness (generalized)  Difficulty in walking, not elsewhere classified  Localized edema     Problem List Patient Active Problem List   Diagnosis Date Noted   Patellar  tendinitis of left knee 08/30/2021   Prepatellar bursitis of left knee 08/30/2021   Lumbar radiculopathy 08/30/2021   Achilles tendinitis of right lower extremity 04/07/2021   Plantar fasciitis, right 03/17/2021   Ankle impingement syndrome, right 03/01/2021   Metatarsalgia of right foot 03/01/2021   COVID-19 virus infection 11/24/2020   ADHD (attention deficit hyperactivity disorder)    Toe injury, left, initial encounter 05/22/2019   MRSA (methicillin resistant staph aureus) urine culture positive on 07/06/16 07/06/2016   Thoracic back pain 12/20/2015   Low back pain radiating to right leg 12/01/2015   Shoulder impingement syndrome, left 11/07/2013   Encounter for long-term (current) use of high-risk medication 05/29/2011   Chronic pain syndrome 03/21/2011   SINUSITIS- ACUTE-NOS 07/29/2009   JOINT EFFUSION, RIGHT KNEE 07/29/2009   ELEVATED BP READING WITHOUT DX HYPERTENSION 07/29/2009   THYROID NODULE, RIGHT 11/20/2008   GERD 06/26/2008   Fibromyalgia 06/26/2008   ANEMIA-IRON DEFICIENCY 06/26/2008   DYSPNEA 12/05/2007   Neck pain 10/15/2007   Cervicalgia 10/15/2007   Hyperlipidemia 09/13/2007   BIPOLAR DISORDER UNSPECIFIED 09/13/2007   Type 2 diabetes mellitus without complication, without long-term current use of insulin ([place]) 06/05/2007    Percival Spanish, PT 11/18/2021, 2:56 PM  [facility] [address]  [place], [place], [address] Phone: [phone]   Fax:  [phone]  Name: Shirley [name] MRN: [id number] Date of Birth: 07/31/1976

## 2021-11-18 NOTE — Patient Instructions (Addendum)
° °  Access Code: [email or web address] URL: [email or web address] Date: 11/18/2021 Prepared by: Annie Paras  Exercises Hooklying Hamstring Stretch with Strap - 2-3 x daily - 7 x weekly - 3 reps - 30 sec hold Supine ITB Stretch with Strap - 2-3 x daily - 7 x weekly - 3 reps - 30 sec hold Supine Quadriceps Stretch with Strap on Table - 2-3 x daily - 7 x weekly - 3 reps - 30 sec hold Prone Quadriceps Stretch with Strap - 2-3 x daily - 7 x weekly - 3 reps - 30 sec hold Supine Piriformis Stretch with Foot on Ground - 2-3 x daily - 7 x weekly - 3 reps - 30 sec hold Supine Figure 4 Piriformis Stretch with Leg Extension - 2-3 x daily - 7 x weekly - 3 reps - 30 sec hold Supine Hip Adduction Isometric with Ball - 2 x daily - 7 x weekly - 2 sets - 10 reps - 5 sec hold Supine Bridge with Mini Swiss Ball Between Knees - 2 x daily - 7 x weekly - 2 sets - 10 reps - 5 sec hold Hooklying Isometric Clamshell - 1 x daily - 7 x weekly - 2 sets - 10 reps - 3 sec hold Bridge with Resistance - 1 x daily - 7 x weekly - 2 sets - 10 reps - 5 sec hold Supine March with Resistance Band - 1 x daily - 7 x weekly - 2 sets - 10 reps - 2-3 sec hold hold Active Straight Leg Raise with Quad Set - 1 x daily - 7 x weekly - 2 sets - 10 reps - 3-5 sec hold Standing Terminal Knee Extension with Resistance - 1 x daily - 7 x weekly - 2 sets - 10 reps - 5 sec hold  Patient Education Kinesiology tape    Kinesiology tape  What is kinesiology tape?  There are many brands of kinesiology tape. KTape, [employer], [employer], Dynamic tape, to name a few.  It is an elasticized tape designed to support the body's natural healing process. This tape provides stability and support to muscles and joints without restricting motion.  It can also help decrease swelling in the area of application.  How does it work?  The tape microscopically lifts and decompresses the skin to allow for drainage of lymph (swelling) to flow away from area,  reducing inflammation. The tape has the ability to help re-educate the neuromuscular system by targeting specific receptors in the skin. The presence of the tape increases the body's awareness of posture and body mechanics.  Do not use with:  Open wounds Skin lesions Adhesive allergies  In some rare cases, mild/moderate skin irritation can occur. This can include redness, itchiness, or hives. If this occurs, immediately remove tape and consult your primary care physician if symptoms are severe or do not resolve within 2 days.  Safe removal of the tape:  To remove tape safely, hold nearby skin with one hand and gentle roll tape down with other hand. You can apply oil or conditioner to tape while in shower prior to removal to loosen adhesive. DO NOT swiftly rip tape off like a band-aid, as this could cause skin tears and additional skin irritation.     For questions, please contact your therapist at:  [facility] [address]  [place], [place], [address] Phone: [phone]   Fax:  [phone]

## 2021-11-23 ENCOUNTER — Ambulatory Visit: Payer: Medicaid Other | Attending: Family Medicine

## 2021-11-23 ENCOUNTER — Other Ambulatory Visit: Payer: Self-pay

## 2021-11-23 DIAGNOSIS — M25562 Pain in left knee: Secondary | ICD-10-CM | POA: Diagnosis not present

## 2021-11-23 DIAGNOSIS — M6281 Muscle weakness (generalized): Secondary | ICD-10-CM | POA: Diagnosis not present

## 2021-11-23 DIAGNOSIS — R600 Localized edema: Secondary | ICD-10-CM | POA: Diagnosis not present

## 2021-11-23 DIAGNOSIS — R262 Difficulty in walking, not elsewhere classified: Secondary | ICD-10-CM | POA: Insufficient documentation

## 2021-11-23 DIAGNOSIS — M25662 Stiffness of left knee, not elsewhere classified: Secondary | ICD-10-CM | POA: Diagnosis not present

## 2021-11-23 NOTE — Therapy (Signed)
[facility] [address]  [place], [place], [address] Phone: [phone]   Fax:  [phone]  Physical Therapy Treatment  Patient Details  Name: Shirley [name] MRN: [id number] Date of Birth: 04-30-1976 Referring Provider (PT): Rosemarie Ax, MD   Encounter Date: 11/23/2021   PT End of Session - 11/23/21 0850     Visit Number 4    Number of Visits 13    Date for PT Re-Evaluation 12/20/21    Authorization Type [facility] Medicaid    PT Start Time 0804    PT Stop Time 6226    PT Time Calculation (min) 43 min    Activity Tolerance Patient tolerated treatment well    Behavior During Therapy [facility] for tasks assessed/performed             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   ANKLE PAIN, LEFT 06/26/2008   BACK PAIN, LUMBAR 10/15/2007   BIPOLAR DISORDER UNSPECIFIED 09/13/2007   CERVICAL RADICULOPATHY, LEFT 06/26/2008   Cervicalgia 10/15/2007   Chronic pain syndrome 03/21/2011   COMMON MIGRAINE 06/26/2008   DIABETES MELLITUS, TYPE II 06/05/2007   DYSPNEA 12/05/2007   EAR PAIN, RIGHT 12/05/2007   ELEVATED BP READING WITHOUT DX HYPERTENSION 07/29/2009   FATIGUE 12/05/2007   FIBROMYALGIA 06/26/2008   GASTROENTERITIS, ACUTE 12/15/2008   GERD 06/26/2008   Headache(784.0) 09/13/2007   HYPERLIPIDEMIA 09/13/2007   JOINT EFFUSION, RIGHT KNEE 07/29/2009   KNEE PAIN, LEFT 11/21/2010   LUMBAR RADICULOPATHY, LEFT 06/26/2008   OTHER DISEASE OF PHARYNX OR NASOPHARYNX 07/03/2008   PERIPHERAL EDEMA 01/06/2009   POLYARTHRITIS 11/21/2010   SHOULDER PAIN, BILATERAL 07/03/2008   SINUSITIS- ACUTE-NOS 07/29/2009   TACHYCARDIA 12/05/2007   THYROID NODULE, RIGHT 11/20/2008   TREMOR 01/02/2008   Vertigo     Past Surgical History:  Procedure Laterality Date   back surgury     lumbar 2001   CESAREAN  SECTION     x 3   thyroid fine needle aspiration  May 2008   showed non neoplastic goiter   VAGINAL HYSTERECTOMY     fibroids    There were no vitals filed for this visit.   Subjective Assessment - 11/23/21 0806     Subjective Knee is ok right now, steps always aggrevate it.    Patient Stated Goals "To have better flexibility so I can squat comfortably w/o pain."    Currently in Pain? No/denies                               [facility] Adult PT Treatment/Exercise - 11/23/21 0001       Knee/Hip Exercises: Aerobic   Nustep L2x45min      Knee/Hip Exercises: Standing   Heel Raises Both;10 reps    Hip Abduction Stengthening;Both;5 reps;Knee straight    Abduction Limitations abd + ext    Forward Step Up Both;10 reps;Hand Hold: 1;Step Height: 4"    Step Down Both;10 reps;Hand Hold: 0;Step Height: 4"    Step Down Limitations lateral step down    Functional Squat 10 reps    Functional Squat Limitations mini squats with UE support     Knee/Hip Exercises: Seated   Long Arc Quad Strengthening;Both;10 reps    Long Arc Quad Limitations with VMO ball squeeze  Ball Squeeze 10x5"    Hamstring Curl Strengthening;Both;10 reps    Hamstring Limitations RTB                       PT Short Term Goals - 11/11/21 0814       PT SHORT TERM GOAL #1   Title Patient to be independent with initial HEP    Status On-going    Target Date 11/29/21               PT Long Term Goals - 11/11/21 0814       PT LONG TERM GOAL #1   Title Patient will be independent with ongoing/advanced HEP for self-management at home    Status On-going    Target Date 12/20/21      PT LONG TERM GOAL #2   Title Patient to demonstrate L knee AROM WFL and without pain limiting    Status On-going    Target Date 12/20/21      PT LONG TERM GOAL #3   Title Patient will stand with good quad control, avoiding genu recurvatum to reduce anterior knee pressure    Status On-going     Target Date 12/20/21      PT LONG TERM GOAL #4   Title Patient will demonstrate improved B LE strength to >/= 4+/5 for improved stability and ease of mobility    Status On-going    Target Date 12/20/21      PT LONG TERM GOAL #5   Title Patient to demonstrate lifting 10lb box from floor using squat with good body mechanics and no pain    Status On-going    Target Date 12/20/21      PT LONG TERM GOAL #6   Title Patient to report ability to perform ADLs, household, and work-related tasks without limitation due to L knee pain, LOM or weakness    Status On-going    Target Date 12/20/21                   Plan - 11/23/21 0851     Clinical Impression Statement Pt noted that the KT tape helped but that she ripped it off and it caused some scars to form so she has been avoiding it until the scabs subside. Pt able to complete all exercises w/o increased pain. She did show some compensation with the step ups trying to avoid full activation of her quads. She needed instruction to avoid heavy UE use with step ups. Continued with knee targeted exercises, especially the VMO to improve patellar tracking. After session pt noting some muscle fatigue generalized but feels the session targeted the correct muscles.    Personal Factors and Comorbidities Comorbidity 2;Fitness;Past/Current Experience;Time since onset of injury/illness/exacerbation;Profession    Comorbidities LBP, lumbar radiculopathy, thoracic/upper back pain, cervicalgia, R Achilles tendinitis, R plantar fasciitis, fibromyalgia, DM-II, GERD, OA    PT Frequency 2x / week    PT Duration 6 weeks    PT Treatment/Interventions ADLs/Self Care Home Management;Cryotherapy;Ultrasound;Gait training;Stair training;Functional mobility training;Therapeutic activities;Therapeutic exercise;Balance training;Neuromuscular re-education;Manual techniques;Passive range of motion;Dry needling;Taping;Vasopneumatic Device;Joint Manipulations    PT Next Visit  Plan review initial HEP as needed; progress proximal LE flexibility and lumbopelvic/LE strengthening; review of taping for patellar tendinitis/prepatellar bursitis PRN    PT Home Exercise Plan Access Code: [email or web address] (1/20, updated 1/27)    Consulted and Agree with Plan of Care Patient             Patient will  benefit from skilled therapeutic intervention in order to improve the following deficits and impairments:  Decreased activity tolerance, Decreased balance, Decreased mobility, Decreased range of motion, Decreased strength, Difficulty walking, Increased edema, Increased fascial restricitons, Increased muscle spasms, Impaired perceived functional ability, Impaired flexibility, Improper body mechanics, Postural dysfunction, Pain  Visit Diagnosis: Acute pain of left knee  Stiffness of left knee, not elsewhere classified  Muscle weakness (generalized)  Difficulty in walking, not elsewhere classified  Localized edema     Problem List Patient Active Problem List   Diagnosis Date Noted   Patellar tendinitis of left knee 08/30/2021   Prepatellar bursitis of left knee 08/30/2021   Lumbar radiculopathy 08/30/2021   Achilles tendinitis of right lower extremity 04/07/2021   Plantar fasciitis, right 03/17/2021   Ankle impingement syndrome, right 03/01/2021   Metatarsalgia of right foot 03/01/2021   COVID-19 virus infection 11/24/2020   ADHD (attention deficit hyperactivity disorder)    Toe injury, left, initial encounter 05/22/2019   MRSA (methicillin resistant staph aureus) urine culture positive on 07/06/16 07/06/2016   Thoracic back pain 12/20/2015   Low back pain radiating to right leg 12/01/2015   Shoulder impingement syndrome, left 11/07/2013   Encounter for long-term (current) use of high-risk medication 05/29/2011   Chronic pain syndrome 03/21/2011   SINUSITIS- ACUTE-NOS 07/29/2009   JOINT EFFUSION, RIGHT KNEE 07/29/2009   ELEVATED BP READING WITHOUT DX HYPERTENSION  07/29/2009   THYROID NODULE, RIGHT 11/20/2008   GERD 06/26/2008   Fibromyalgia 06/26/2008   ANEMIA-IRON DEFICIENCY 06/26/2008   DYSPNEA 12/05/2007   Neck pain 10/15/2007   Cervicalgia 10/15/2007   Hyperlipidemia 09/13/2007   BIPOLAR DISORDER UNSPECIFIED 09/13/2007   Type 2 diabetes mellitus without complication, without long-term current use of insulin ([place]) 06/05/2007    Artist Pais, PTA 11/23/2021, 9:57 AM  [facility] [address]  [place], [place], [address] Phone: [phone]   Fax:  [phone]  Name: Shirley [name] MRN: [id number] Date of Birth: 07-Mar-1976

## 2021-11-25 ENCOUNTER — Encounter: Payer: Medicaid Other | Admitting: Physical Therapy

## 2021-11-29 ENCOUNTER — Ambulatory Visit: Payer: Medicaid Other

## 2021-11-29 ENCOUNTER — Other Ambulatory Visit: Payer: Self-pay

## 2021-11-29 DIAGNOSIS — R262 Difficulty in walking, not elsewhere classified: Secondary | ICD-10-CM | POA: Diagnosis not present

## 2021-11-29 DIAGNOSIS — M25562 Pain in left knee: Secondary | ICD-10-CM | POA: Diagnosis not present

## 2021-11-29 DIAGNOSIS — M6281 Muscle weakness (generalized): Secondary | ICD-10-CM | POA: Diagnosis not present

## 2021-11-29 DIAGNOSIS — M25662 Stiffness of left knee, not elsewhere classified: Secondary | ICD-10-CM | POA: Diagnosis not present

## 2021-11-29 DIAGNOSIS — R600 Localized edema: Secondary | ICD-10-CM

## 2021-11-29 NOTE — Therapy (Signed)
[facility] [address]  [place], [place], [address] Phone: [phone]   Fax:  [phone]  Physical Therapy Treatment  Patient Details  Name: Shirley [name] MRN: [id number] Date of Birth: 1976-07-02 Referring Provider (PT): Rosemarie Ax, MD   Encounter Date: 11/29/2021   PT End of Session - 11/29/21 0934     Visit Number 5    Number of Visits 13    Date for PT Re-Evaluation 12/20/21    Authorization Type [facility] Medicaid    Authorization Time Period 1/20- 3/3    Authorization - Visit Number 4    Authorization - Number of Visits 12    PT Start Time 1700   pt late   PT Stop Time 0935    PT Time Calculation (min) 41 min    Activity Tolerance Patient tolerated treatment well    Behavior During Therapy [facility] for tasks assessed/performed             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   ANKLE PAIN, LEFT 06/26/2008   BACK PAIN, LUMBAR 10/15/2007   BIPOLAR DISORDER UNSPECIFIED 09/13/2007   CERVICAL RADICULOPATHY, LEFT 06/26/2008   Cervicalgia 10/15/2007   Chronic pain syndrome 03/21/2011   COMMON MIGRAINE 06/26/2008   DIABETES MELLITUS, TYPE II 06/05/2007   DYSPNEA 12/05/2007   EAR PAIN, RIGHT 12/05/2007   ELEVATED BP READING WITHOUT DX HYPERTENSION 07/29/2009   FATIGUE 12/05/2007   FIBROMYALGIA 06/26/2008   GASTROENTERITIS, ACUTE 12/15/2008   GERD 06/26/2008   Headache(784.0) 09/13/2007   HYPERLIPIDEMIA 09/13/2007   JOINT EFFUSION, RIGHT KNEE 07/29/2009   KNEE PAIN, LEFT 11/21/2010   LUMBAR RADICULOPATHY, LEFT 06/26/2008   OTHER DISEASE OF PHARYNX OR NASOPHARYNX 07/03/2008   PERIPHERAL EDEMA 01/06/2009   POLYARTHRITIS 11/21/2010   SHOULDER PAIN, BILATERAL 07/03/2008   SINUSITIS- ACUTE-NOS 07/29/2009   TACHYCARDIA 12/05/2007   THYROID NODULE, RIGHT 11/20/2008   TREMOR  01/02/2008   Vertigo     Past Surgical History:  Procedure Laterality Date   back surgury     lumbar 2001   CESAREAN SECTION     x 3   thyroid fine needle aspiration  May 2008   showed non neoplastic goiter   VAGINAL HYSTERECTOMY     fibroids    There were no vitals filed for this visit.   Subjective Assessment - 11/29/21 0855     Subjective "I was a little sore after last session, no pain today but at time my knees get sore and I have to pop them."    Patient Stated Goals "To have better flexibility so I can squat comfortably w/o pain."    Currently in Pain? No/denies                               [facility] Adult PT Treatment/Exercise - 11/29/21 0001       Knee/Hip Exercises: Aerobic   Recumbent Bike L2 x 6 min      Knee/Hip Exercises: Standing   Heel Raises Both;20 reps    Hip ADduction Strengthening;Both;10 reps;2 sets   red TB anchored to door   Other Standing Knee Exercises SLS DL 10 reps      Knee/Hip Exercises: Supine   Short Arc Quad Sets Strengthening;Both;10 reps;2 sets   with ER,  avoiding TKE due to reports of burning along patellar tendon with TKE   Bridges with Greig Right Strengthening;Both;2 sets;10 reps   cues to increase hip ADD     Knee/Hip Exercises: Sidelying   Hip ABduction Strengthening;Both;2 sets;10 reps    Hip ABduction Limitations in slight hip ext                       PT Short Term Goals - 11/29/21 0903       PT SHORT TERM GOAL #1   Title Patient to be independent with initial HEP    Status Achieved   11/29/21   Target Date 11/29/21               PT Long Term Goals - 11/11/21 0814       PT LONG TERM GOAL #1   Title Patient will be independent with ongoing/advanced HEP for self-management at home    Status On-going    Target Date 12/20/21      PT LONG TERM GOAL #2   Title Patient to demonstrate L knee AROM WFL and without pain limiting    Status On-going    Target Date 12/20/21      PT  LONG TERM GOAL #3   Title Patient will stand with good quad control, avoiding genu recurvatum to reduce anterior knee pressure    Status On-going    Target Date 12/20/21      PT LONG TERM GOAL #4   Title Patient will demonstrate improved B LE strength to >/= 4+/5 for improved stability and ease of mobility    Status On-going    Target Date 12/20/21      PT LONG TERM GOAL #5   Title Patient to demonstrate lifting 10lb box from floor using squat with good body mechanics and no pain    Status On-going    Target Date 12/20/21      PT LONG TERM GOAL #6   Title Patient to report ability to perform ADLs, household, and work-related tasks without limitation due to L knee pain, LOM or weakness    Status On-going    Target Date 12/20/21                   Plan - 11/29/21 1009     Clinical Impression Statement Pt tolerated the progression of exercises well, only showing some hip muscle fatigue towards the end of the session. Cuing needed with all exercises to correct form and to prevent any compensation. She still requires instruction to avoid knee hyperexntension with stance. No concerns with HEP, STG met today. She would continue to benefit from proximal strengthening to increase muscular endurance.    Personal Factors and Comorbidities Comorbidity 2;Fitness;Past/Current Experience;Time since onset of injury/illness/exacerbation;Profession    Comorbidities LBP, lumbar radiculopathy, thoracic/upper back pain, cervicalgia, R Achilles tendinitis, R plantar fasciitis, fibromyalgia, DM-II, GERD, OA    PT Frequency 2x / week    PT Duration 6 weeks    PT Treatment/Interventions ADLs/Self Care Home Management;Cryotherapy;Ultrasound;Gait training;Stair training;Functional mobility training;Therapeutic activities;Therapeutic exercise;Balance training;Neuromuscular re-education;Manual techniques;Passive range of motion;Dry needling;Taping;Vasopneumatic Device;Joint Manipulations    PT Next Visit  Plan progress proximal LE flexibility and lumbopelvic/LE strengthening; review of taping for patellar tendinitis/prepatellar bursitis PRN    PT Home Exercise Plan Access Code: [email or web address] (1/20, updated 1/27)    Consulted and Agree with Plan of Care Patient             Patient will benefit  from skilled therapeutic intervention in order to improve the following deficits and impairments:  Decreased activity tolerance, Decreased balance, Decreased mobility, Decreased range of motion, Decreased strength, Difficulty walking, Increased edema, Increased fascial restricitons, Increased muscle spasms, Impaired perceived functional ability, Impaired flexibility, Improper body mechanics, Postural dysfunction, Pain  Visit Diagnosis: Acute pain of left knee  Stiffness of left knee, not elsewhere classified  Muscle weakness (generalized)  Difficulty in walking, not elsewhere classified  Localized edema     Problem List Patient Active Problem List   Diagnosis Date Noted   Patellar tendinitis of left knee 08/30/2021   Prepatellar bursitis of left knee 08/30/2021   Lumbar radiculopathy 08/30/2021   Achilles tendinitis of right lower extremity 04/07/2021   Plantar fasciitis, right 03/17/2021   Ankle impingement syndrome, right 03/01/2021   Metatarsalgia of right foot 03/01/2021   COVID-19 virus infection 11/24/2020   ADHD (attention deficit hyperactivity disorder)    Toe injury, left, initial encounter 05/22/2019   MRSA (methicillin resistant staph aureus) urine culture positive on 07/06/16 07/06/2016   Thoracic back pain 12/20/2015   Low back pain radiating to right leg 12/01/2015   Shoulder impingement syndrome, left 11/07/2013   Encounter for long-term (current) use of high-risk medication 05/29/2011   Chronic pain syndrome 03/21/2011   SINUSITIS- ACUTE-NOS 07/29/2009   JOINT EFFUSION, RIGHT KNEE 07/29/2009   ELEVATED BP READING WITHOUT DX HYPERTENSION 07/29/2009   THYROID NODULE, RIGHT  11/20/2008   GERD 06/26/2008   Fibromyalgia 06/26/2008   ANEMIA-IRON DEFICIENCY 06/26/2008   DYSPNEA 12/05/2007   Neck pain 10/15/2007   Cervicalgia 10/15/2007   Hyperlipidemia 09/13/2007   BIPOLAR DISORDER UNSPECIFIED 09/13/2007   Type 2 diabetes mellitus without complication, without long-term current use of insulin ([place]) 06/05/2007    Artist Pais, PTA 11/29/2021, 11:16 AM  [facility] [address]  [place], [place], [address] Phone: [phone]   Fax:  [phone]  Name: Shirley [name] MRN: [id number] Date of Birth: 1976/09/18

## 2021-12-02 ENCOUNTER — Ambulatory Visit: Payer: Medicaid Other | Admitting: Physical Therapy

## 2021-12-06 ENCOUNTER — Ambulatory Visit: Payer: Medicaid Other

## 2021-12-06 ENCOUNTER — Other Ambulatory Visit: Payer: Self-pay

## 2021-12-06 DIAGNOSIS — M25562 Pain in left knee: Secondary | ICD-10-CM | POA: Diagnosis not present

## 2021-12-06 DIAGNOSIS — R262 Difficulty in walking, not elsewhere classified: Secondary | ICD-10-CM

## 2021-12-06 DIAGNOSIS — R600 Localized edema: Secondary | ICD-10-CM | POA: Diagnosis not present

## 2021-12-06 DIAGNOSIS — M6281 Muscle weakness (generalized): Secondary | ICD-10-CM | POA: Diagnosis not present

## 2021-12-06 DIAGNOSIS — M25662 Stiffness of left knee, not elsewhere classified: Secondary | ICD-10-CM

## 2021-12-06 NOTE — Therapy (Signed)
[facility] [address]  [place], [place], [address] Phone: [phone]   Fax:  [phone]  Physical Therapy Treatment  Patient Details  Name: Shirley [name] MRN: [id number] Date of Birth: Oct 24, 1975 Referring Provider (PT): Rosemarie Ax, MD   Encounter Date: 12/06/2021   PT End of Session - 12/06/21 0841     Visit Number 6    Number of Visits 13    Date for PT Re-Evaluation 12/20/21    Authorization Type [facility] Medicaid    Authorization Time Period 11/11/21-12/23/21    Authorization - Visit Number 5    Authorization - Number of Visits 12    PT Start Time 0805    PT Stop Time 0845    PT Time Calculation (min) 40 min    Activity Tolerance Patient tolerated treatment well    Behavior During Therapy [facility] for tasks assessed/performed             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   ANKLE PAIN, LEFT 06/26/2008   BACK PAIN, LUMBAR 10/15/2007   BIPOLAR DISORDER UNSPECIFIED 09/13/2007   CERVICAL RADICULOPATHY, LEFT 06/26/2008   Cervicalgia 10/15/2007   Chronic pain syndrome 03/21/2011   COMMON MIGRAINE 06/26/2008   DIABETES MELLITUS, TYPE II 06/05/2007   DYSPNEA 12/05/2007   EAR PAIN, RIGHT 12/05/2007   ELEVATED BP READING WITHOUT DX HYPERTENSION 07/29/2009   FATIGUE 12/05/2007   FIBROMYALGIA 06/26/2008   GASTROENTERITIS, ACUTE 12/15/2008   GERD 06/26/2008   Headache(784.0) 09/13/2007   HYPERLIPIDEMIA 09/13/2007   JOINT EFFUSION, RIGHT KNEE 07/29/2009   KNEE PAIN, LEFT 11/21/2010   LUMBAR RADICULOPATHY, LEFT 06/26/2008   OTHER DISEASE OF PHARYNX OR NASOPHARYNX 07/03/2008   PERIPHERAL EDEMA 01/06/2009   POLYARTHRITIS 11/21/2010   SHOULDER PAIN, BILATERAL 07/03/2008   SINUSITIS- ACUTE-NOS 07/29/2009   TACHYCARDIA 12/05/2007   THYROID NODULE, RIGHT 11/20/2008   TREMOR  01/02/2008   Vertigo     Past Surgical History:  Procedure Laterality Date   back surgury     lumbar 2001   CESAREAN SECTION     x 3   thyroid fine needle aspiration  May 2008   showed non neoplastic goiter   VAGINAL HYSTERECTOMY     fibroids    There were no vitals filed for this visit.   Subjective Assessment - 12/06/21 0948     Subjective Pt reports that she was able to do steps while carrying some items w/o knee pain.    Limitations Standing;Walking;House hold activities    How long can you stand comfortably? a couple hours    How long can you walk comfortably? 30 minutes    Patient Stated Goals "To have better flexibility so I can squat comfortably w/o pain."    Currently in Pain? No/denies                               [facility] Adult PT Treatment/Exercise - 12/06/21 0001       Knee/Hip Exercises: Aerobic   Recumbent Bike L3 x 6 min      Knee/Hip Exercises: Machines for Strengthening   Cybex Leg Press 20lb 2x10 BLE      Knee/Hip Exercises: Standing   Lateral Step Up Both;10 reps;Hand Hold: 1;Step Height: 6"    Forward Step  Up Both;10 reps;Hand Hold: 1;Step Height: 6"    Other Standing Knee Exercises SLS DL 10 reps  SLS R/L 3x10" with intermittent UE support     Knee/Hip Exercises: Seated   Long Arc Quad Strengthening;Both;10 reps; 2 sets   [employer] Weight 2 lbs.    Long Arc Quad Limitations with hip ER 1 set, 1 set with VMO ball squeeze                       PT Short Term Goals - 11/29/21 0903       PT SHORT TERM GOAL #1   Title Patient to be independent with initial HEP    Status Achieved   11/29/21   Target Date 11/29/21               PT Long Term Goals - 11/11/21 0814       PT LONG TERM GOAL #1   Title Patient will be independent with ongoing/advanced HEP for self-management at home    Status On-going    Target Date 12/20/21      PT LONG TERM GOAL #2   Title Patient to demonstrate L knee AROM WFL and  without pain limiting    Status On-going    Target Date 12/20/21      PT LONG TERM GOAL #3   Title Patient will stand with good quad control, avoiding genu recurvatum to reduce anterior knee pressure    Status On-going    Target Date 12/20/21      PT LONG TERM GOAL #4   Title Patient will demonstrate improved B LE strength to >/= 4+/5 for improved stability and ease of mobility    Status On-going    Target Date 12/20/21      PT LONG TERM GOAL #5   Title Patient to demonstrate lifting 10lb box from floor using squat with good body mechanics and no pain    Status On-going    Target Date 12/20/21      PT LONG TERM GOAL #6   Title Patient to report ability to perform ADLs, household, and work-related tasks without limitation due to L knee pain, LOM or weakness    Status On-going    Target Date 12/20/21                   Plan - 12/06/21 0950     Clinical Impression Statement Today we started the session with a RDL SL but on the R leg she was unable to descend w/o buckling her knee. Also noticed on fwd step up she was reluctant to shift weight fwd, avoiding full quad activation. We continued focusing most of the session on quad activation in WB and NWB. She is making functional progress, as she was able to navigate steps yesterday while carrying items w/o knee pain. Pt is seeing her MD on 2/17, so will need a progress note on her next appointment this Thursday.    Personal Factors and Comorbidities Comorbidity 2;Fitness;Past/Current Experience;Time since onset of injury/illness/exacerbation;Profession    Comorbidities LBP, lumbar radiculopathy, thoracic/upper back pain, cervicalgia, R Achilles tendinitis, R plantar fasciitis, fibromyalgia, DM-II, GERD, OA    PT Frequency 2x / week    PT Duration 6 weeks    PT Treatment/Interventions ADLs/Self Care Home Management;Cryotherapy;Ultrasound;Gait training;Stair training;Functional mobility training;Therapeutic activities;Therapeutic  exercise;Balance training;Neuromuscular re-education;Manual techniques;Passive range of motion;Dry needling;Taping;Vasopneumatic Device;Joint Manipulations    PT Next Visit Plan MD PN; progress proximal LE flexibility and  lumbopelvic/LE strengthening; review of taping for patellar tendinitis/prepatellar bursitis PRN    PT Home Exercise Plan Access Code: [email or web address] (1/20, updated 1/27)    Consulted and Agree with Plan of Care Patient             Patient will benefit from skilled therapeutic intervention in order to improve the following deficits and impairments:  Decreased activity tolerance, Decreased balance, Decreased mobility, Decreased range of motion, Decreased strength, Difficulty walking, Increased edema, Increased fascial restricitons, Increased muscle spasms, Impaired perceived functional ability, Impaired flexibility, Improper body mechanics, Postural dysfunction, Pain  Visit Diagnosis: Acute pain of left knee  Stiffness of left knee, not elsewhere classified  Muscle weakness (generalized)  Difficulty in walking, not elsewhere classified  Localized edema     Problem List Patient Active Problem List   Diagnosis Date Noted   Patellar tendinitis of left knee 08/30/2021   Prepatellar bursitis of left knee 08/30/2021   Lumbar radiculopathy 08/30/2021   Achilles tendinitis of right lower extremity 04/07/2021   Plantar fasciitis, right 03/17/2021   Ankle impingement syndrome, right 03/01/2021   Metatarsalgia of right foot 03/01/2021   COVID-19 virus infection 11/24/2020   ADHD (attention deficit hyperactivity disorder)    Toe injury, left, initial encounter 05/22/2019   MRSA (methicillin resistant staph aureus) urine culture positive on 07/06/16 07/06/2016   Thoracic back pain 12/20/2015   Low back pain radiating to right leg 12/01/2015   Shoulder impingement syndrome, left 11/07/2013   Encounter for long-term (current) use of high-risk medication 05/29/2011   Chronic  pain syndrome 03/21/2011   SINUSITIS- ACUTE-NOS 07/29/2009   JOINT EFFUSION, RIGHT KNEE 07/29/2009   ELEVATED BP READING WITHOUT DX HYPERTENSION 07/29/2009   THYROID NODULE, RIGHT 11/20/2008   GERD 06/26/2008   Fibromyalgia 06/26/2008   ANEMIA-IRON DEFICIENCY 06/26/2008   DYSPNEA 12/05/2007   Neck pain 10/15/2007   Cervicalgia 10/15/2007   Hyperlipidemia 09/13/2007   BIPOLAR DISORDER UNSPECIFIED 09/13/2007   Type 2 diabetes mellitus without complication, without long-term current use of insulin ([place]) 06/05/2007    Artist Pais, PTA 12/06/2021, 9:53 AM  [facility] [address]  [place], [place], [address] Phone: [phone]   Fax:  [phone]  Name: MACKAYLA MULLINS MRN: [id number] Date of Birth: 27-Mar-1976

## 2021-12-08 ENCOUNTER — Other Ambulatory Visit: Payer: Self-pay

## 2021-12-08 ENCOUNTER — Ambulatory Visit: Payer: Medicaid Other | Admitting: Physical Therapy

## 2021-12-08 ENCOUNTER — Encounter: Payer: Self-pay | Admitting: Physical Therapy

## 2021-12-08 DIAGNOSIS — R600 Localized edema: Secondary | ICD-10-CM

## 2021-12-08 DIAGNOSIS — R262 Difficulty in walking, not elsewhere classified: Secondary | ICD-10-CM | POA: Diagnosis not present

## 2021-12-08 DIAGNOSIS — M25562 Pain in left knee: Secondary | ICD-10-CM | POA: Diagnosis not present

## 2021-12-08 DIAGNOSIS — M25662 Stiffness of left knee, not elsewhere classified: Secondary | ICD-10-CM | POA: Diagnosis not present

## 2021-12-08 DIAGNOSIS — M6281 Muscle weakness (generalized): Secondary | ICD-10-CM

## 2021-12-08 NOTE — Therapy (Signed)
[facility] [address]  [place], [place], [address] Phone: [phone]   Fax:  [phone]  Physical Therapy Treatment / Progress Note  Patient Details  Name: Shirley [name] MRN: [id number] Date of Birth: [birth year] Referring Provider (PT): Rosemarie Ax, MD  Progress Note  Reporting Period 11/08/2021 to 12/08/2021  See note below for Objective Data and Assessment of Progress/Goals.     Encounter Date: 12/08/2021   PT End of Session - 12/08/21 0850     Visit Number 7    Number of Visits 13    Date for PT Re-Evaluation 12/20/21    Authorization Type [facility] Medicaid    Authorization Time Period 11/11/21-12/23/21    Authorization - Visit Number 6    Authorization - Number of Visits 12    PT Start Time 1165    PT Stop Time 0931    PT Time Calculation (min) 41 min    Activity Tolerance Patient tolerated treatment well    Behavior During Therapy [facility] for tasks assessed/performed             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   ANKLE PAIN, LEFT 06/26/2008   BACK PAIN, LUMBAR 10/15/2007   BIPOLAR DISORDER UNSPECIFIED 09/13/2007   CERVICAL RADICULOPATHY, LEFT 06/26/2008   Cervicalgia 10/15/2007   Chronic pain syndrome 03/21/2011   COMMON MIGRAINE 06/26/2008   DIABETES MELLITUS, TYPE II 06/05/2007   DYSPNEA 12/05/2007   EAR PAIN, RIGHT 12/05/2007   ELEVATED BP READING WITHOUT DX HYPERTENSION 07/29/2009   FATIGUE 12/05/2007   FIBROMYALGIA 06/26/2008   GASTROENTERITIS, ACUTE 12/15/2008   GERD 06/26/2008   Headache(784.0) 09/13/2007   HYPERLIPIDEMIA 09/13/2007   JOINT EFFUSION, RIGHT KNEE 07/29/2009   KNEE PAIN, LEFT 11/21/2010   LUMBAR RADICULOPATHY, LEFT 06/26/2008   OTHER DISEASE OF PHARYNX OR NASOPHARYNX 07/03/2008   PERIPHERAL EDEMA 01/06/2009   POLYARTHRITIS 11/21/2010    SHOULDER PAIN, BILATERAL 07/03/2008   SINUSITIS- ACUTE-NOS 07/29/2009   TACHYCARDIA 12/05/2007   THYROID NODULE, RIGHT 11/20/2008   TREMOR 01/02/2008   Vertigo     Past Surgical History:  Procedure Laterality Date   back surgury     lumbar 2001   CESAREAN SECTION     x 3   thyroid fine needle aspiration  May 2008   showed non neoplastic goiter   VAGINAL HYSTERECTOMY     fibroids    There were no vitals filed for this visit.   Subjective Assessment - 12/08/21 0853     Subjective Pt reports her knee is better than before - no longer having the "internal soreness" and noting better tolerance for climbing stairs while holding/carrying things. She notes she has developed some skin sensitivity from removing the [place] - pt instructed to take a break from taping until skin irritation resolved.    Limitations Standing;Walking;House hold activities    How long can you stand comfortably? a couple hours    How long can you walk comfortably? 30 minutes    Patient Stated Goals "To have better flexibility so I can squat comfortably w/o pain."    Currently in Pain? No/denies                [facility] PT Assessment - 12/08/21 0850       Assessment   Medical Diagnosis L knee patellar tendinitis &  prepatellar bursitis    Referring Provider (PT) Rosemarie Ax, MD    Onset Date/Surgical Date 08/29/21    Next MD Visit 12/09/21      AROM   Right Knee Extension -2   in LAQ   Right Knee Flexion 130    Left Knee Extension 0   in LAQ   Left Knee Flexion 129      Strength   Right Hip Flexion 4/5    Right Hip Extension 5/5    Right Hip External Rotation  4+/5    Right Hip Internal Rotation 4+/5    Right Hip ABduction 4+/5    Right Hip ADduction 4+/5    Left Hip Flexion 4/5    Left Hip Extension 4+/5    Left Hip External Rotation 4/5    Left Hip Internal Rotation 4+/5    Left Hip ABduction 4+/5    Left Hip ADduction 4/5    Right Knee Flexion 5/5    Right Knee Extension 5/5    Left  Knee Flexion 4+/5    Left Knee Extension 4+/5    Right Ankle Dorsiflexion 4+/5    Right Ankle Plantar Flexion 5/5    Left Ankle Dorsiflexion 5/5    Left Ankle Plantar Flexion 5/5                           [facility] Adult PT Treatment/Exercise - 12/08/21 0850       Ambulation/Gait   Stairs Yes    Stairs Assistance 6: Modified independent (Device/Increase time)    Stair Management Technique No rails;One rail Right   no rails on ascent; R rail on descent   Number of Stairs 14   2 sets - 2nd set carrying 6# db in each hand   Height of Stairs 7      Exercises   Exercises Knee/Hip      Knee/Hip Exercises: Aerobic   Recumbent Bike L3 x 6 min      Knee/Hip Exercises: Standing   Hip Flexion Both;2 sets;10 reps;Stengthening;Knee straight;Knee bent    Hip Flexion Limitations 1st set - SLR with looped TB at ankles; 2nd set - march with looped TB at midfoot      Knee/Hip Exercises: Seated   Marching Both;20 reps;Strengthening    Marching Limitations looped red TB at knees                     PT Education - 12/08/21 0931     Education Details Progress with PT and anticipated plan to transition to HEP at end of month - pt in agreement    Person(s) Educated Patient    Methods Explanation    Comprehension Verbalized understanding              PT Short Term Goals - 11/29/21 0903       PT SHORT TERM GOAL #1   Title Patient to be independent with initial HEP    Status Achieved   11/29/21   Target Date 11/29/21               PT Long Term Goals - 12/08/21 0858       PT LONG TERM GOAL #1   Title Patient will be independent with ongoing/advanced HEP for self-management at home    Status Partially Met   12/08/21 - met for current HEP   Target Date 12/20/21      PT LONG TERM  GOAL #2   Title Patient to demonstrate L knee AROM WFL and without pain limiting    Status Achieved   12/08/21   Target Date --      PT LONG TERM GOAL #3   Title Patient will  stand with good quad control, avoiding genu recurvatum to reduce anterior knee pressure    Status Partially Met   12/08/21 - improving awareness but still has to self-correct at times   Target Date 12/20/21      PT LONG TERM GOAL #4   Title Patient will demonstrate improved B LE strength to >/= 4+/5 for improved stability and ease of mobility    Status Partially Met   12/08/21 - met for knees and ankles; partially met for hips   Target Date 12/20/21      PT LONG TERM GOAL #5   Title Patient to demonstrate lifting 10lb box from floor using squat with good body mechanics and no pain    Status Achieved   12/08/21   Target Date --      PT LONG TERM GOAL #6   Title Patient to report ability to perform ADLs, household, and work-related tasks without limitation due to L knee pain, LOM or weakness    Status Partially Met    Target Date 12/20/21                   Plan - 12/08/21 0917     Clinical Impression Statement Shirley [name] reports significant reduction in L knee pain with improving activity tolerance, especially lifting things from floor (LTG #5 met) ability to climb stairs carrying things w/o increased L knee pain (LTG #6 partially met). Her L knee ROM is now symmetrical to her R w/o pain noted (LTG #2 met) and her overall B LE strength is significantly improved, especially at hips with only mild hip flexion and adduction weakness still evident (LTG #4 partially met). She reports improved awareness of posture in standing but does still find herself having to self-correct at times to avoid locking her knees into hyperextension in static stance (LTG #3 partially met). HEP going well and exercises progressed to address ongoing proximal LE weakness. Overall, Shirley [name] is progressing well with PT and anticipate she will be ready to transition to her HEP at the end of her current POC on 12/20/21.    Comorbidities LBP, lumbar radiculopathy, thoracic/upper back pain, cervicalgia, R Achilles tendinitis,  R plantar fasciitis, fibromyalgia, DM-II, GERD, OA    Rehab Potential Good    PT Frequency 2x / week    PT Duration 6 weeks    PT Treatment/Interventions ADLs/Self Care Home Management;Cryotherapy;Ultrasound;Gait training;Stair training;Functional mobility training;Therapeutic activities;Therapeutic exercise;Balance training;Neuromuscular re-education;Manual techniques;Passive range of motion;Dry needling;Taping;Vasopneumatic Device;Joint Manipulations    PT Next Visit Plan progress proximal LE flexibility and lumbopelvic/LE strengthening; review of taping for patellar tendinitis/prepatellar bursitis PRN    PT Home Exercise Plan Access Code: [email or web address] (1/20, updated 1/27)    Consulted and Agree with Plan of Care Patient             Patient will benefit from skilled therapeutic intervention in order to improve the following deficits and impairments:  Decreased activity tolerance, Decreased balance, Decreased mobility, Decreased range of motion, Decreased strength, Difficulty walking, Increased edema, Increased fascial restricitons, Increased muscle spasms, Impaired perceived functional ability, Impaired flexibility, Improper body mechanics, Postural dysfunction, Pain  Visit Diagnosis: Acute pain of left knee  Stiffness of left knee, not elsewhere classified  Muscle weakness (generalized)  Difficulty in walking, not elsewhere classified  Localized edema     Problem List Patient Active Problem List   Diagnosis Date Noted   Patellar tendinitis of left knee 08/30/2021   Prepatellar bursitis of left knee 08/30/2021   Lumbar radiculopathy 08/30/2021   Achilles tendinitis of right lower extremity 04/07/2021   Plantar fasciitis, right 03/17/2021   Ankle impingement syndrome, right 03/01/2021   Metatarsalgia of right foot 03/01/2021   COVID-19 virus infection 11/24/2020   ADHD (attention deficit hyperactivity disorder)    Toe injury, left, initial encounter 05/22/2019   MRSA  (methicillin resistant staph aureus) urine culture positive on 07/06/16 07/06/2016   Thoracic back pain 12/20/2015   Low back pain radiating to right leg 12/01/2015   Shoulder impingement syndrome, left 11/07/2013   Encounter for long-term (current) use of high-risk medication 05/29/2011   Chronic pain syndrome 03/21/2011   SINUSITIS- ACUTE-NOS 07/29/2009   JOINT EFFUSION, RIGHT KNEE 07/29/2009   ELEVATED BP READING WITHOUT DX HYPERTENSION 07/29/2009   THYROID NODULE, RIGHT 11/20/2008   GERD 06/26/2008   Fibromyalgia 06/26/2008   ANEMIA-IRON DEFICIENCY 06/26/2008   DYSPNEA 12/05/2007   Neck pain 10/15/2007   Cervicalgia 10/15/2007   Hyperlipidemia 09/13/2007   BIPOLAR DISORDER UNSPECIFIED 09/13/2007   Type 2 diabetes mellitus without complication, without long-term current use of insulin ([place]) 06/05/2007    Percival Spanish, PT 12/08/2021, 10:45 AM  [facility] [address]  [place], [place], [address] Phone: [phone]   Fax:  [phone]  Name: Shirley [name] MRN: [id number] Date of Birth: 1975-11-09

## 2021-12-09 ENCOUNTER — Ambulatory Visit (INDEPENDENT_AMBULATORY_CARE_PROVIDER_SITE_OTHER): Payer: Medicaid Other | Admitting: Family Medicine

## 2021-12-09 ENCOUNTER — Encounter: Payer: Self-pay | Admitting: Family Medicine

## 2021-12-09 DIAGNOSIS — M7042 Prepatellar bursitis, left knee: Secondary | ICD-10-CM

## 2021-12-09 NOTE — Progress Notes (Signed)
°  Shirley [name] - 46 y.o. female MRN [id number]  Date of birth: [birth year]  SUBJECTIVE:  Including CC & ROS.  No chief complaint on file.   Shirley [name] is a 46 y.o. female that is following up for her left knee pain.  She has been through physical therapy and reports having improvement.  Has tried taping of the knee.    Review of Systems See HPI   HISTORY: Past Medical, Surgical, Social, and Family History Reviewed & Updated per EMR.   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   ANKLE PAIN, LEFT 06/26/2008   BACK PAIN, LUMBAR 10/15/2007   BIPOLAR DISORDER UNSPECIFIED 09/13/2007   CERVICAL RADICULOPATHY, LEFT 06/26/2008   Cervicalgia 10/15/2007   Chronic pain syndrome 03/21/2011   COMMON MIGRAINE 06/26/2008   DIABETES MELLITUS, TYPE II 06/05/2007   DYSPNEA 12/05/2007   EAR PAIN, RIGHT 12/05/2007   ELEVATED BP READING WITHOUT DX HYPERTENSION 07/29/2009   FATIGUE 12/05/2007   FIBROMYALGIA 06/26/2008   GASTROENTERITIS, ACUTE 12/15/2008   GERD 06/26/2008   Headache(784.0) 09/13/2007   HYPERLIPIDEMIA 09/13/2007   JOINT EFFUSION, RIGHT KNEE 07/29/2009   KNEE PAIN, LEFT 11/21/2010   LUMBAR RADICULOPATHY, LEFT 06/26/2008   OTHER DISEASE OF PHARYNX OR NASOPHARYNX 07/03/2008   PERIPHERAL EDEMA 01/06/2009   POLYARTHRITIS 11/21/2010   SHOULDER PAIN, BILATERAL 07/03/2008   SINUSITIS- ACUTE-NOS 07/29/2009   TACHYCARDIA 12/05/2007   THYROID NODULE, RIGHT 11/20/2008   TREMOR 01/02/2008   Vertigo     Past Surgical History:  Procedure Laterality Date   back surgury     lumbar 2001   CESAREAN SECTION     x 3   thyroid fine needle aspiration  May 2008   showed non neoplastic goiter   VAGINAL HYSTERECTOMY     fibroids     PHYSICAL EXAM:  VS: BP 118/88 (BP Location: Left Arm, Patient Position: Sitting)    Ht 5\' 4"  (1.626 m)     Wt 204 lb (92.5 kg)    BMI 35.02 kg/m  Physical Exam Gen: NAD, alert, cooperative with exam, well-appearing MSK:  Neurovascularly intact       ASSESSMENT & PLAN:   Prepatellar bursitis of left knee Occurred after an injury at [facility].  She continues to get improvement with therapy. -Counseled on home exercise therapy and supportive care. -Continue and finish out physical therapy. -We can reevaluate once her therapy is concluded.

## 2021-12-09 NOTE — Assessment & Plan Note (Signed)
Occurred after an injury at [facility].  She continues to get improvement with therapy. -Counseled on home exercise therapy and supportive care. -Continue and finish out physical therapy. -We can reevaluate once her therapy is concluded.

## 2021-12-11 IMAGING — MR MR LUMBAR SPINE W/O CM
4 of 5 series · 18 of 48 positions shown · non-contrast
Comparison: Radiography 10/26/2013.  MRI 04/15/2009.

CLINICAL DATA: Chronic low back pain with bilateral leg and foot
numbness. Previous surgery.

EXAM:
MRI LUMBAR SPINE WITHOUT CONTRAST
TECHNIQUE: Multiplanar, multisequence MR imaging of the lumbar spine was
performed. No intravenous contrast was administered.

[Series 5: T2 · sagittal · 4.0mm · 0.73mm/px · 6 of 15 slices shown (1 of 2)]
[im 1/15]
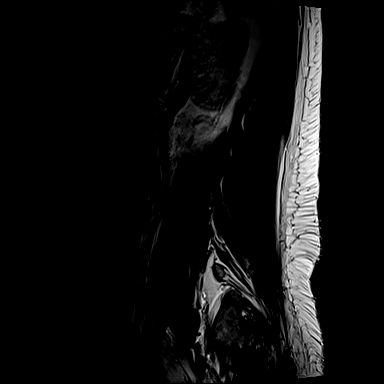
[im 3/15]
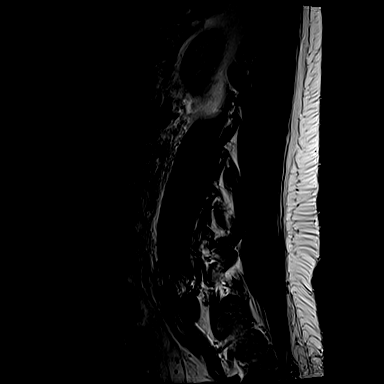
[im 6/15]
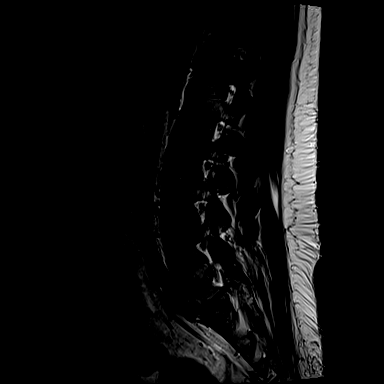
[im 9/15]
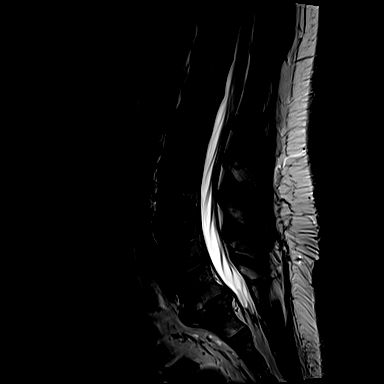
[im 12/15]
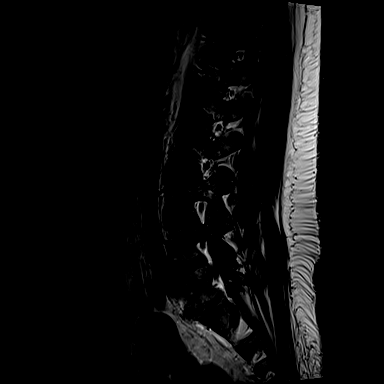
[im 15/15]
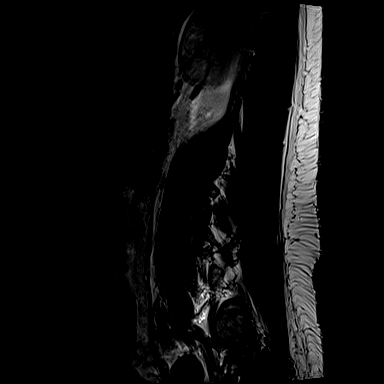

[Series 6: T1 · sagittal · 4.0mm · 0.73mm/px · 3 of 15 slices shown (1 of 2)]
[im 3/15]
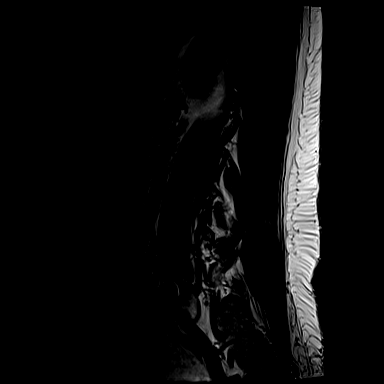
[im 9/15]
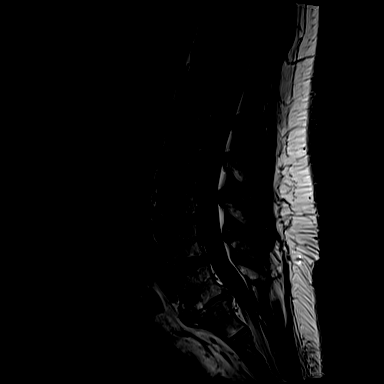
[im 15/15]
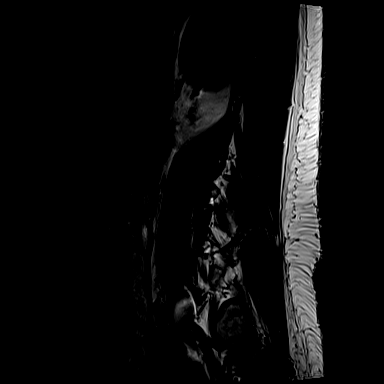

[Series 10: T1 · axial · 4.0mm · 0.28mm/px · z∈[-51,+115]mm · 3 of 39 slices shown (2 of 2)]
[im 6/39]
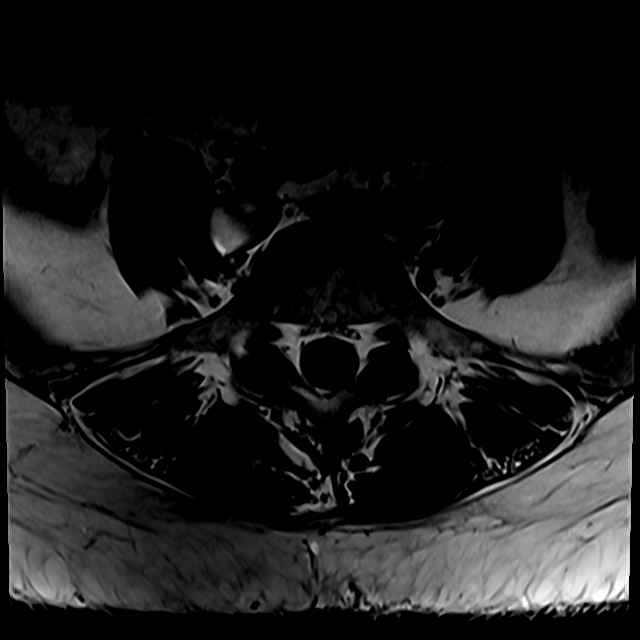
[im 20/39]
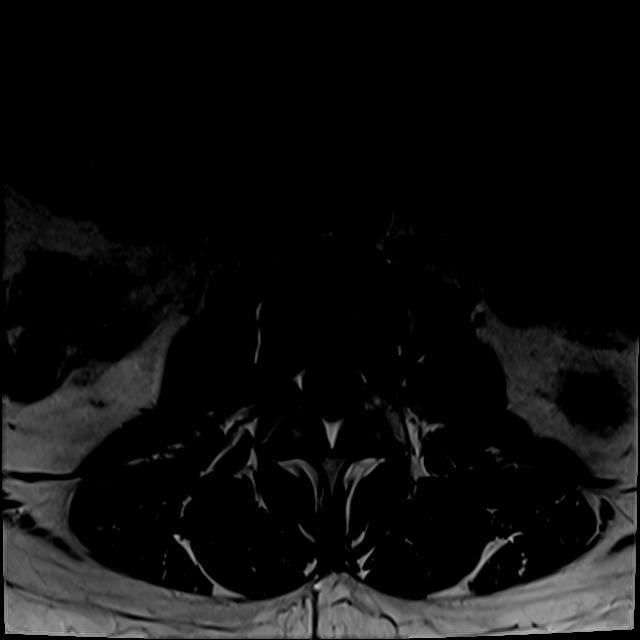
[im 33/39]
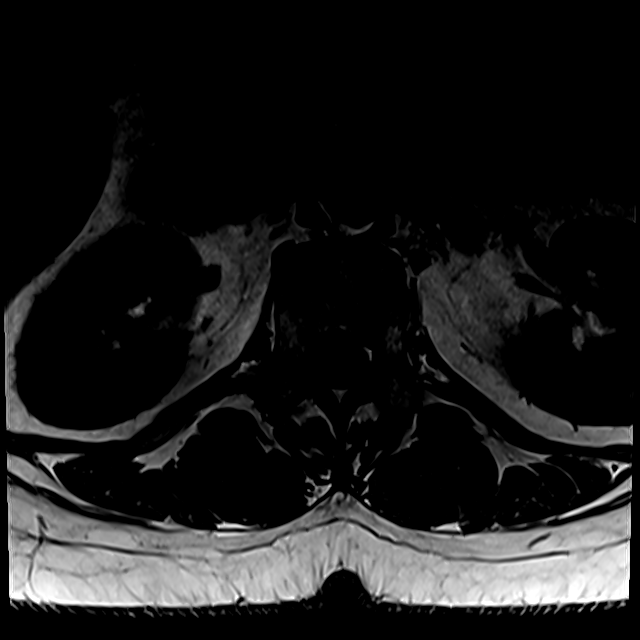

[Series 13: T2 · axial · 4.0mm · 0.28mm/px · z∈[-74,+115]mm · 6 of 39 slices shown (2 of 2)]
[im 1/39]
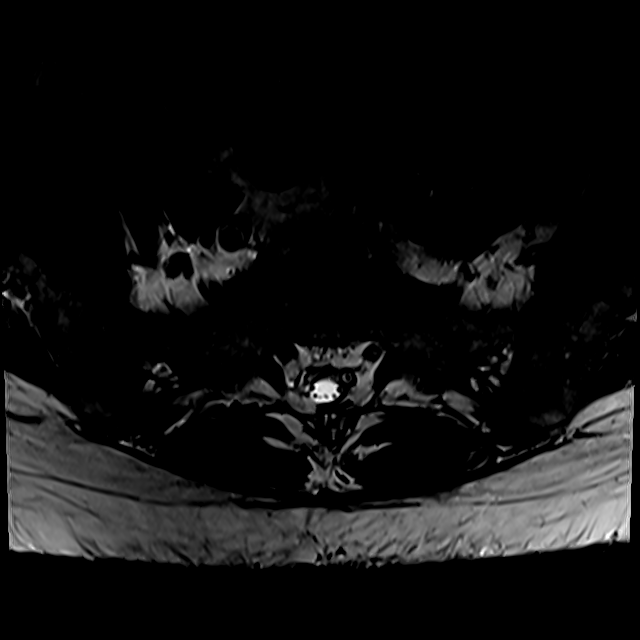
[im 6/39]
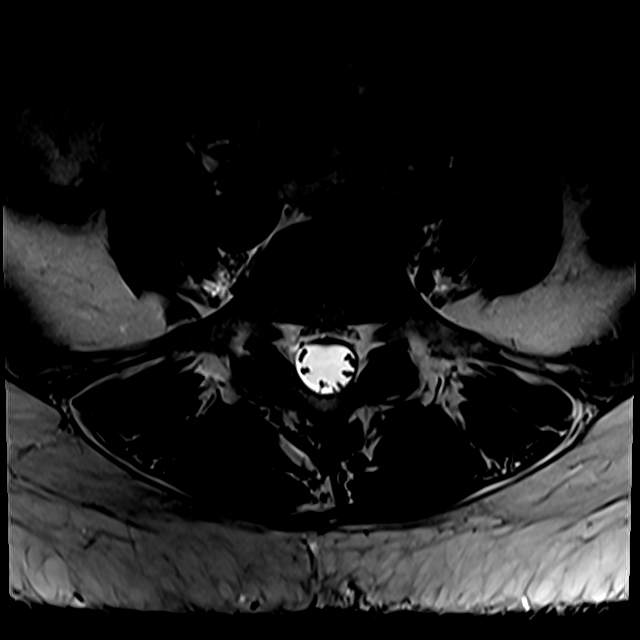
[im 11/39]
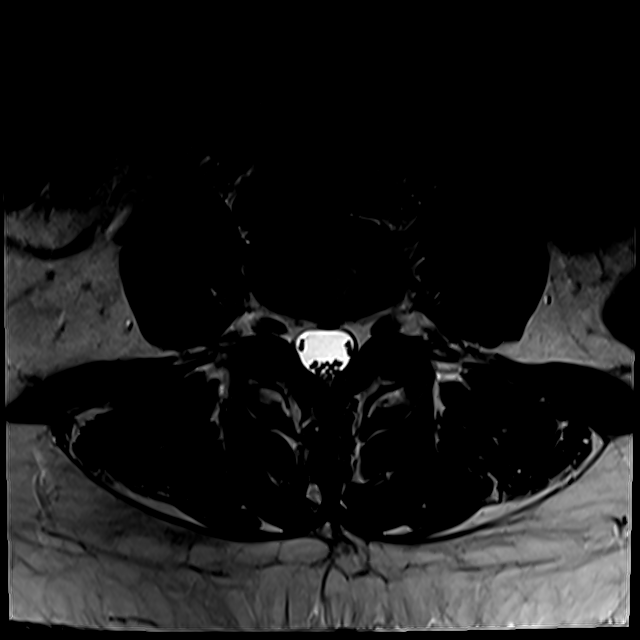
[im 17/39]
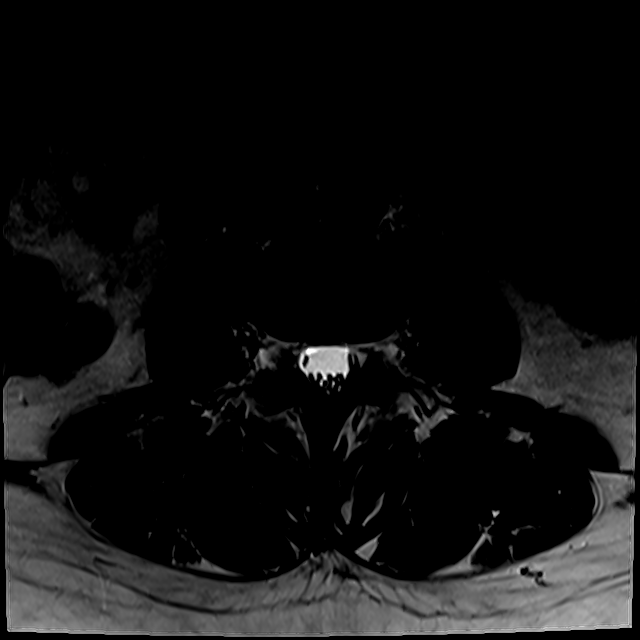
[im 20/39]
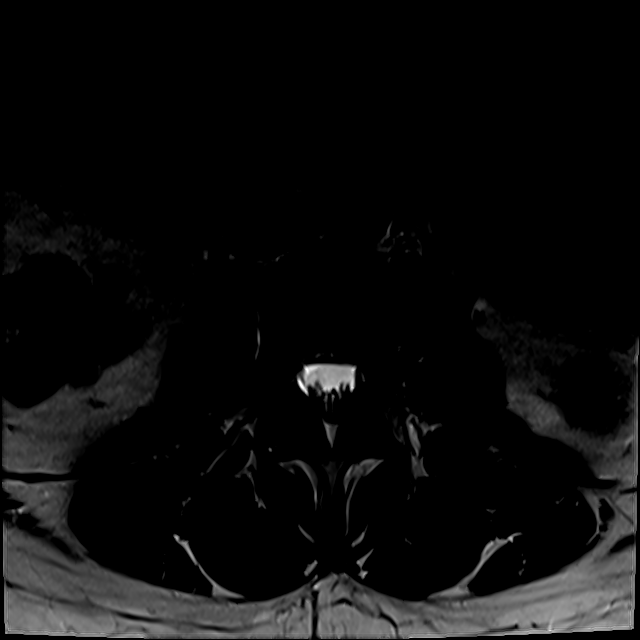
[im 33/39]
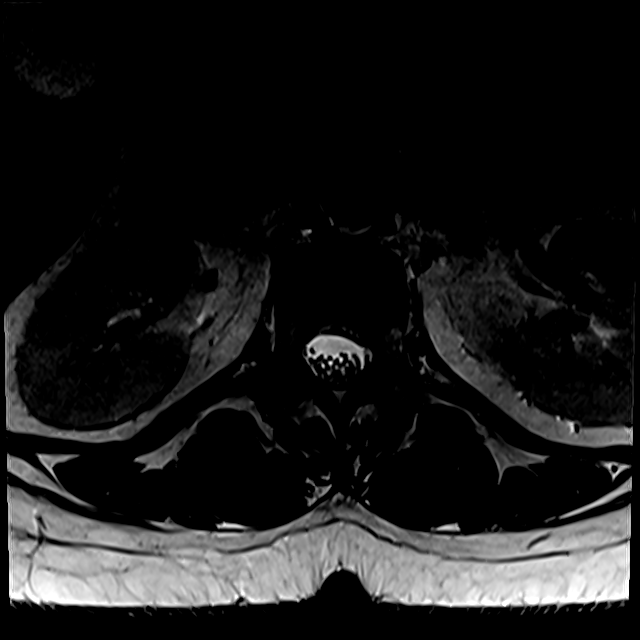

[18 of 48 positions shown; findings below may reference images not displayed]

FINDINGS: Segmentation:  5 lumbar type vertebral bodies.

Alignment:  Normal

Vertebrae:  No fracture or primary bone lesion.

Conus medullaris and cauda equina: Conus extends to the L1 level.
Conus and cauda equina appear normal.

Paraspinal and other soft tissues: Negative

Disc levels:

T12-L1: Shallow left posterolateral disc protrusion but without
likely neural compression.

L1-2, L2-3 and L3-4: Normal. No disc or facet pathology. No
stenosis.

L4-5: Minimal disc bulge. Minimal facet hypertrophy. No significant
stenosis.

L5-S1: Previous posterior decompression. Chronic loss of disc
height. Endplate osteophytes but without compressive narrowing of
the canal or foramina.
IMPRESSION: 1. Previous posterior decompression and discectomy at L5-S1. Chronic
loss of disc height. Endplate osteophytes but no compressive
narrowing of the canal or foramina.
2. L4-5: Minimal disc bulge and facet hypertrophy. No stenosis or
neural compression. The facet arthropathy could possibly relate to
low back pain or referred facet syndrome pain.
3. T12-L1: Shallow left posterolateral disc protrusion but without
likely neural compression.

## 2021-12-12 ENCOUNTER — Encounter: Payer: Self-pay | Admitting: Family Medicine

## 2021-12-12 ENCOUNTER — Ambulatory Visit: Payer: Medicaid Other | Admitting: Physical Therapy

## 2021-12-12 ENCOUNTER — Other Ambulatory Visit: Payer: Self-pay

## 2021-12-12 ENCOUNTER — Encounter: Payer: Self-pay | Admitting: Physical Therapy

## 2021-12-12 DIAGNOSIS — R600 Localized edema: Secondary | ICD-10-CM

## 2021-12-12 DIAGNOSIS — M25662 Stiffness of left knee, not elsewhere classified: Secondary | ICD-10-CM | POA: Diagnosis not present

## 2021-12-12 DIAGNOSIS — M25562 Pain in left knee: Secondary | ICD-10-CM | POA: Diagnosis not present

## 2021-12-12 DIAGNOSIS — M6281 Muscle weakness (generalized): Secondary | ICD-10-CM | POA: Diagnosis not present

## 2021-12-12 DIAGNOSIS — R262 Difficulty in walking, not elsewhere classified: Secondary | ICD-10-CM

## 2021-12-12 NOTE — Therapy (Signed)
[facility] [address]  [place], [place], [address] Phone: [phone]   Fax:  [phone]  Physical Therapy Treatment  Patient Details  Name: Shirley [name] MRN: [id number] Date of Birth: 1976-10-12 Referring Provider (PT): Rosemarie Ax, MD   Encounter Date: 12/12/2021   PT End of Session - 12/12/21 0855     Visit Number 8    Number of Visits 13    Date for PT Re-Evaluation 12/20/21    Authorization Type [facility] Medicaid    Authorization Time Period 11/11/21-12/23/21    Authorization - Visit Number 7    Authorization - Number of Visits 12    PT Start Time 5537   Pt arrived late   PT Stop Time 0929    PT Time Calculation (min) 34 min    Activity Tolerance Patient tolerated treatment well    Behavior During Therapy [facility] for tasks assessed/performed             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   ANKLE PAIN, LEFT 06/26/2008   BACK PAIN, LUMBAR 10/15/2007   BIPOLAR DISORDER UNSPECIFIED 09/13/2007   CERVICAL RADICULOPATHY, LEFT 06/26/2008   Cervicalgia 10/15/2007   Chronic pain syndrome 03/21/2011   COMMON MIGRAINE 06/26/2008   DIABETES MELLITUS, TYPE II 06/05/2007   DYSPNEA 12/05/2007   EAR PAIN, RIGHT 12/05/2007   ELEVATED BP READING WITHOUT DX HYPERTENSION 07/29/2009   FATIGUE 12/05/2007   FIBROMYALGIA 06/26/2008   GASTROENTERITIS, ACUTE 12/15/2008   GERD 06/26/2008   Headache(784.0) 09/13/2007   HYPERLIPIDEMIA 09/13/2007   JOINT EFFUSION, RIGHT KNEE 07/29/2009   KNEE PAIN, LEFT 11/21/2010   LUMBAR RADICULOPATHY, LEFT 06/26/2008   OTHER DISEASE OF PHARYNX OR NASOPHARYNX 07/03/2008   PERIPHERAL EDEMA 01/06/2009   POLYARTHRITIS 11/21/2010   SHOULDER PAIN, BILATERAL 07/03/2008   SINUSITIS- ACUTE-NOS 07/29/2009   TACHYCARDIA 12/05/2007   THYROID NODULE, RIGHT 11/20/2008    TREMOR 01/02/2008   Vertigo     Past Surgical History:  Procedure Laterality Date   back surgury     lumbar 2001   CESAREAN SECTION     x 3   thyroid fine needle aspiration  May 2008   showed non neoplastic goiter   VAGINAL HYSTERECTOMY     fibroids    There were no vitals filed for this visit.   Subjective Assessment - 12/12/21 0900     Subjective Pt reports MD pleased with her progress and agrees with the plan to finish up PT hopefully by the end of the month.    Patient Stated Goals "To have better flexibility so I can squat comfortably w/o pain."    Currently in Pain? No/denies                               [facility] Adult PT Treatment/Exercise - 12/12/21 0855       Knee/Hip Exercises: Aerobic   Recumbent Bike L3 x 6 min      Knee/Hip Exercises: Standing   Hip Flexion Both;2 sets;10 reps;Stengthening;Knee straight;Knee bent    Hip Flexion Limitations 1 set each - SLR with looped TB at ankles &  march with looped TB at midfoot    Other Standing Knee Exercises B red TB side-stepping and fwd/back monster walk 2 x 25 ft  Knee/Hip Exercises: Seated   Long Arc Quad Right;Left;2 sets;10 reps;Strengthening    Long Arc Quad Limitations + VMO ball squeeze with looped red TB at ankles    Marching Both;20 reps;Strengthening    Marching Limitations looped red TB at knees    Hamstring Curl Right;Left;2 sets;10 reps;Strengthening    Hamstring Limitations red TB      Knee/Hip Exercises: Sidelying   Hip ADduction Left;10 reps;AROM;Strengthening    Clams R/L red TB clam 10 x 3"    Other Sidelying Knee/Hip Exercises R/L side plank + red TB clam x 5                     PT Education - 12/12/21 0929     Education Details HEP update    Person(s) Educated Patient    Methods Explanation;Demonstration;Verbal cues;Handout    Comprehension Verbalized understanding;Verbal cues required;Returned demonstration;Need further instruction               PT Short Term Goals - 11/29/21 0903       PT SHORT TERM GOAL #1   Title Patient to be independent with initial HEP    Status Achieved   11/29/21   Target Date 11/29/21               PT Long Term Goals - 12/08/21 0858       PT LONG TERM GOAL #1   Title Patient will be independent with ongoing/advanced HEP for self-management at home    Status Partially Met   12/08/21 - met for current HEP   Target Date 12/20/21      PT LONG TERM GOAL #2   Title Patient to demonstrate L knee AROM WFL and without pain limiting    Status Achieved   12/08/21   Target Date --      PT LONG TERM GOAL #3   Title Patient will stand with good quad control, avoiding genu recurvatum to reduce anterior knee pressure    Status Partially Met   12/08/21 - improving awareness but still has to self-correct at times   Target Date 12/20/21      PT LONG TERM GOAL #4   Title Patient will demonstrate improved B LE strength to >/= 4+/5 for improved stability and ease of mobility    Status Partially Met   12/08/21 - met for knees and ankles; partially met for hips   Target Date 12/20/21      PT LONG TERM GOAL #5   Title Patient to demonstrate lifting 10lb box from floor using squat with good body mechanics and no pain    Status Achieved   12/08/21   Target Date --      PT LONG TERM GOAL #6   Title Patient to report ability to perform ADLs, household, and work-related tasks without limitation due to L knee pain, LOM or weakness    Status Partially Met    Target Date 12/20/21                   Plan - 12/12/21 2330     Clinical Impression Statement Shirley [name] reports MD agrees with current PT plan and will have her f/u with him again upon completion of POC. Continued lumbopelvic and proximal LE strengthening targeting areas of continued weakness identified on MMT last visit. Pt reports a hard workout but no increased pain, therefore HEP updated to include some of exercises performed today.     Comorbidities LBP, lumbar radiculopathy,  thoracic/upper back pain, cervicalgia, R Achilles tendinitis, R plantar fasciitis, fibromyalgia, DM-II, GERD, OA    Rehab Potential Good    PT Frequency 2x / week    PT Duration 6 weeks    PT Treatment/Interventions ADLs/Self Care Home Management;Cryotherapy;Ultrasound;Gait training;Stair training;Functional mobility training;Therapeutic activities;Therapeutic exercise;Balance training;Neuromuscular re-education;Manual techniques;Passive range of motion;Dry needling;Taping;Vasopneumatic Device;Joint Manipulations    PT Next Visit Plan progress proximal LE flexibility and lumbopelvic/LE strengthening; review of taping for patellar tendinitis/prepatellar bursitis PRN    PT Home Exercise Plan Access Code: [email or web address] (1/20, updated 1/27 & 2/20)    Consulted and Agree with Plan of Care Patient             Patient will benefit from skilled therapeutic intervention in order to improve the following deficits and impairments:  Decreased activity tolerance, Decreased balance, Decreased mobility, Decreased range of motion, Decreased strength, Difficulty walking, Increased edema, Increased fascial restricitons, Increased muscle spasms, Impaired perceived functional ability, Impaired flexibility, Improper body mechanics, Postural dysfunction, Pain  Visit Diagnosis: Acute pain of left knee  Stiffness of left knee, not elsewhere classified  Muscle weakness (generalized)  Difficulty in walking, not elsewhere classified  Localized edema     Problem List Patient Active Problem List   Diagnosis Date Noted   Patellar tendinitis of left knee 08/30/2021   Prepatellar bursitis of left knee 08/30/2021   Lumbar radiculopathy 08/30/2021   Achilles tendinitis of right lower extremity 04/07/2021   Plantar fasciitis, right 03/17/2021   Ankle impingement syndrome, right 03/01/2021   Metatarsalgia of right foot 03/01/2021   COVID-19 virus infection 11/24/2020   ADHD  (attention deficit hyperactivity disorder)    Toe injury, left, initial encounter 05/22/2019   MRSA (methicillin resistant staph aureus) urine culture positive on 07/06/16 07/06/2016   Thoracic back pain 12/20/2015   Low back pain radiating to right leg 12/01/2015   Shoulder impingement syndrome, left 11/07/2013   Encounter for long-term (current) use of high-risk medication 05/29/2011   Chronic pain syndrome 03/21/2011   SINUSITIS- ACUTE-NOS 07/29/2009   JOINT EFFUSION, RIGHT KNEE 07/29/2009   ELEVATED BP READING WITHOUT DX HYPERTENSION 07/29/2009   THYROID NODULE, RIGHT 11/20/2008   GERD 06/26/2008   Fibromyalgia 06/26/2008   ANEMIA-IRON DEFICIENCY 06/26/2008   DYSPNEA 12/05/2007   Neck pain 10/15/2007   Cervicalgia 10/15/2007   Hyperlipidemia 09/13/2007   BIPOLAR DISORDER UNSPECIFIED 09/13/2007   Type 2 diabetes mellitus without complication, without long-term current use of insulin ([place]) 06/05/2007    Percival Spanish, PT 12/12/2021, 9:34 AM  [facility] [address]  [place], [place], [address] Phone: [phone]   Fax:  [phone]  Name: Shirley [name] MRN: [id number] Date of Birth: [birth year]

## 2021-12-12 NOTE — Patient Instructions (Signed)
° ° ° ° °  Access Code: [email or web address] URL: [email or web address] Date: 12/12/2021 Prepared by: Annie Paras  Exercises Hooklying Hamstring Stretch with Strap - 2-3 x daily - 7 x weekly - 3 reps - 30 sec hold Supine ITB Stretch with Strap - 2-3 x daily - 7 x weekly - 3 reps - 30 sec hold Supine Quadriceps Stretch with Strap on Table - 2-3 x daily - 7 x weekly - 3 reps - 30 sec hold Prone Quadriceps Stretch with Strap - 2-3 x daily - 7 x weekly - 3 reps - 30 sec hold Supine Piriformis Stretch with Foot on Ground - 2-3 x daily - 7 x weekly - 3 reps - 30 sec hold Supine Figure 4 Piriformis Stretch with Leg Extension - 2-3 x daily - 7 x weekly - 3 reps - 30 sec hold Supine Hip Adduction Isometric with Ball - 2 x daily - 7 x weekly - 2 sets - 10 reps - 5 sec hold Supine Bridge with Mini Swiss Ball Between Knees - 2 x daily - 7 x weekly - 2 sets - 10 reps - 5 sec hold Hooklying Isometric Clamshell - 1 x daily - 7 x weekly - 2 sets - 10 reps - 3 sec hold Bridge with Resistance - 1 x daily - 7 x weekly - 2 sets - 10 reps - 5 sec hold Supine March with Resistance Band - 1 x daily - 7 x weekly - 2 sets - 10 reps - 2-3 sec hold hold Active Straight Leg Raise with Quad Set - 1 x daily - 7 x weekly - 2 sets - 10 reps - 3-5 sec hold Standing Terminal Knee Extension with Resistance - 1 x daily - 4-5 x weekly - 2 sets - 10 reps - 5 sec hold Clamshell with Resistance - 1 x daily - 4-5 x weekly - 2 sets - 10 reps - 3-5 sec hold Sidelying Hip Adduction - 1 x daily - 4-5 x weekly - 2 sets - 10 reps - 3 sec hold Marching with Resistance - 1 x daily - 4-5 x weekly - 2 sets - 10 reps - 3 sec hold Seated Knee Extension with Resistance - 1 x daily - 7 x weekly - 2 sets - 10 reps - 3 sec hold  Patient Education Kinesiology tape

## 2021-12-15 ENCOUNTER — Ambulatory Visit: Payer: Medicaid Other

## 2021-12-15 ENCOUNTER — Other Ambulatory Visit: Payer: Self-pay

## 2021-12-15 DIAGNOSIS — M25662 Stiffness of left knee, not elsewhere classified: Secondary | ICD-10-CM | POA: Diagnosis not present

## 2021-12-15 DIAGNOSIS — M25562 Pain in left knee: Secondary | ICD-10-CM | POA: Diagnosis not present

## 2021-12-15 DIAGNOSIS — M6281 Muscle weakness (generalized): Secondary | ICD-10-CM | POA: Diagnosis not present

## 2021-12-15 DIAGNOSIS — R600 Localized edema: Secondary | ICD-10-CM

## 2021-12-15 DIAGNOSIS — R262 Difficulty in walking, not elsewhere classified: Secondary | ICD-10-CM | POA: Diagnosis not present

## 2021-12-15 NOTE — Therapy (Signed)
[facility] [address]  [place], [place], [address] Phone: [phone]   Fax:  [phone]  Physical Therapy Treatment  Patient Details  Name: Shirley [name] MRN: [id number] Date of Birth: 06/09/1976 Referring Provider (PT): Rosemarie Ax, MD   Encounter Date: 12/15/2021   PT End of Session - 12/15/21 0906     Visit Number 9    Number of Visits 13    Date for PT Re-Evaluation 12/20/21    Authorization Type [facility] Medicaid    Authorization Time Period 11/11/21-12/23/21    Authorization - Visit Number 7    Authorization - Number of Visits 12    PT Start Time [phone]   pt late   PT Stop Time 0900    PT Time Calculation (min) 48 min    Activity Tolerance Patient tolerated treatment well    Behavior During Therapy [facility] for tasks assessed/performed             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   ANKLE PAIN, LEFT 06/26/2008   BACK PAIN, LUMBAR 10/15/2007   BIPOLAR DISORDER UNSPECIFIED 09/13/2007   CERVICAL RADICULOPATHY, LEFT 06/26/2008   Cervicalgia 10/15/2007   Chronic pain syndrome 03/21/2011   COMMON MIGRAINE 06/26/2008   DIABETES MELLITUS, TYPE II 06/05/2007   DYSPNEA 12/05/2007   EAR PAIN, RIGHT 12/05/2007   ELEVATED BP READING WITHOUT DX HYPERTENSION 07/29/2009   FATIGUE 12/05/2007   FIBROMYALGIA 06/26/2008   GASTROENTERITIS, ACUTE 12/15/2008   GERD 06/26/2008   Headache(784.0) 09/13/2007   HYPERLIPIDEMIA 09/13/2007   JOINT EFFUSION, RIGHT KNEE 07/29/2009   KNEE PAIN, LEFT 11/21/2010   LUMBAR RADICULOPATHY, LEFT 06/26/2008   OTHER DISEASE OF PHARYNX OR NASOPHARYNX 07/03/2008   PERIPHERAL EDEMA 01/06/2009   POLYARTHRITIS 11/21/2010   SHOULDER PAIN, BILATERAL 07/03/2008   SINUSITIS- ACUTE-NOS 07/29/2009   TACHYCARDIA 12/05/2007   THYROID NODULE, RIGHT 11/20/2008   TREMOR  01/02/2008   Vertigo     Past Surgical History:  Procedure Laterality Date   back surgury     lumbar 2001   CESAREAN SECTION     x 3   thyroid fine needle aspiration  May 2008   showed non neoplastic goiter   VAGINAL HYSTERECTOMY     fibroids    There were no vitals filed for this visit.   Subjective Assessment - 12/15/21 0814     Subjective Pt reports soreness from last session and still on board with ending PT by the end of the month.    Patient Stated Goals "To have better flexibility so I can squat comfortably w/o pain."    Currently in Pain? No/denies                               [facility] Adult PT Treatment/Exercise - 12/15/21 0001       Knee/Hip Exercises: Standing   Lateral Step Up Left;10 reps;Hand Hold: 1;Step Height: 6"    Lateral Step Up Limitations with red TB around knees    Forward Step Up Left;10 reps;Hand Hold: 1;Step Height: 6"    Forward Step Up Limitations with red TB around knees    Other Standing Knee Exercises B red TB fwd/back monster walk 4 x 25 ft      Knee/Hip Exercises: Supine  Bridges Strengthening;Both;2 sets;10 reps    Bridges Limitations feet supported on bosu ball, arms crossed   Straight Leg Raises Strengthening;Left;20 reps      Knee/Hip Exercises: Sidelying   Hip ADduction Limitations reviewed per pt request      Manual Therapy   Manual Therapy Soft tissue mobilization    Manual therapy comments edu on self IASTM post exercise to improve muscle recovery    Soft tissue mobilization IASTM with rolling stick to quads, hasmtrings                       PT Short Term Goals - 11/29/21 0903       PT SHORT TERM GOAL #1   Title Patient to be independent with initial HEP    Status Achieved   11/29/21   Target Date 11/29/21               PT Long Term Goals - 12/08/21 0858       PT LONG TERM GOAL #1   Title Patient will be independent with ongoing/advanced HEP for self-management at home     Status Partially Met   12/08/21 - met for current HEP   Target Date 12/20/21      PT LONG TERM GOAL #2   Title Patient to demonstrate L knee AROM WFL and without pain limiting    Status Achieved   12/08/21   Target Date --      PT LONG TERM GOAL #3   Title Patient will stand with good quad control, avoiding genu recurvatum to reduce anterior knee pressure    Status Partially Met   12/08/21 - improving awareness but still has to self-correct at times   Target Date 12/20/21      PT LONG TERM GOAL #4   Title Patient will demonstrate improved B LE strength to >/= 4+/5 for improved stability and ease of mobility    Status Partially Met   12/08/21 - met for knees and ankles; partially met for hips   Target Date 12/20/21      PT LONG TERM GOAL #5   Title Patient to demonstrate lifting 10lb box from floor using squat with good body mechanics and no pain    Status Achieved   12/08/21   Target Date --      PT LONG TERM GOAL #6   Title Patient to report ability to perform ADLs, household, and work-related tasks without limitation due to L knee pain, LOM or weakness    Status Partially Met    Target Date 12/20/21                   Plan - 12/15/21 0907     Clinical Impression Statement Pt had some questions about the sidelying hip adduction exercise given to her last visit. I answered her questions and reviewed the exercises with her for clarification. Pt tolerated the progression of the exercises well today with no reports of increased pain but noticeable muscular fatigue. Post session ended with IASTM to the quad and hamstrings, gave home instruction on self massage post exercise to improve muscle recovery. She does plan on ending PT next visit.    Personal Factors and Comorbidities Comorbidity 2;Fitness;Past/Current Experience;Time since onset of injury/illness/exacerbation;Profession    Comorbidities LBP, lumbar radiculopathy, thoracic/upper back pain, cervicalgia, R Achilles tendinitis,  R plantar fasciitis, fibromyalgia, DM-II, GERD, OA    PT Frequency 2x / week    PT Duration 6 weeks  PT Treatment/Interventions ADLs/Self Care Home Management;Cryotherapy;Ultrasound;Gait training;Stair training;Functional mobility training;Therapeutic activities;Therapeutic exercise;Balance training;Neuromuscular re-education;Manual techniques;Passive range of motion;Dry needling;Taping;Vasopneumatic Device;Joint Manipulations    PT Next Visit Plan Wrap up PT; progress proximal LE flexibility and lumbopelvic/LE strengthening; review of taping for patellar tendinitis/prepatellar bursitis PRN    PT Home Exercise Plan Access Code: [email or web address] (1/20, updated 1/27 & 2/20)    Consulted and Agree with Plan of Care Patient             Patient will benefit from skilled therapeutic intervention in order to improve the following deficits and impairments:  Decreased activity tolerance, Decreased balance, Decreased mobility, Decreased range of motion, Decreased strength, Difficulty walking, Increased edema, Increased fascial restricitons, Increased muscle spasms, Impaired perceived functional ability, Impaired flexibility, Improper body mechanics, Postural dysfunction, Pain  Visit Diagnosis: Acute pain of left knee  Stiffness of left knee, not elsewhere classified  Muscle weakness (generalized)  Difficulty in walking, not elsewhere classified  Localized edema     Problem List Patient Active Problem List   Diagnosis Date Noted   Patellar tendinitis of left knee 08/30/2021   Prepatellar bursitis of left knee 08/30/2021   Lumbar radiculopathy 08/30/2021   Achilles tendinitis of right lower extremity 04/07/2021   Plantar fasciitis, right 03/17/2021   Ankle impingement syndrome, right 03/01/2021   Metatarsalgia of right foot 03/01/2021   COVID-19 virus infection 11/24/2020   ADHD (attention deficit hyperactivity disorder)    Toe injury, left, initial encounter 05/22/2019   MRSA (methicillin  resistant staph aureus) urine culture positive on 07/06/16 07/06/2016   Thoracic back pain 12/20/2015   Low back pain radiating to right leg 12/01/2015   Shoulder impingement syndrome, left 11/07/2013   Encounter for long-term (current) use of high-risk medication 05/29/2011   Chronic pain syndrome 03/21/2011   SINUSITIS- ACUTE-NOS 07/29/2009   JOINT EFFUSION, RIGHT KNEE 07/29/2009   ELEVATED BP READING WITHOUT DX HYPERTENSION 07/29/2009   THYROID NODULE, RIGHT 11/20/2008   GERD 06/26/2008   Fibromyalgia 06/26/2008   ANEMIA-IRON DEFICIENCY 06/26/2008   DYSPNEA 12/05/2007   Neck pain 10/15/2007   Cervicalgia 10/15/2007   Hyperlipidemia 09/13/2007   BIPOLAR DISORDER UNSPECIFIED 09/13/2007   Type 2 diabetes mellitus without complication, without long-term current use of insulin ([place]) 06/05/2007    Artist Pais, PTA 12/15/2021, 9:12 AM  [facility] [address]  [place], [place], [address] Phone: [phone]   Fax:  [phone]  Name: Shirley [name] MRN: [id number] Date of Birth: [birth year]

## 2021-12-16 ENCOUNTER — Ambulatory Visit: Payer: Medicaid Other | Admitting: Family Medicine

## 2021-12-20 ENCOUNTER — Ambulatory Visit: Payer: Medicaid Other | Admitting: Physical Therapy

## 2021-12-20 ENCOUNTER — Encounter: Payer: Self-pay | Admitting: Family Medicine

## 2021-12-20 ENCOUNTER — Other Ambulatory Visit: Payer: Self-pay

## 2021-12-20 ENCOUNTER — Ambulatory Visit (INDEPENDENT_AMBULATORY_CARE_PROVIDER_SITE_OTHER): Payer: Medicaid Other | Admitting: Family Medicine

## 2021-12-20 ENCOUNTER — Encounter: Payer: Self-pay | Admitting: Physical Therapy

## 2021-12-20 DIAGNOSIS — R600 Localized edema: Secondary | ICD-10-CM | POA: Diagnosis not present

## 2021-12-20 DIAGNOSIS — M6281 Muscle weakness (generalized): Secondary | ICD-10-CM

## 2021-12-20 DIAGNOSIS — M25562 Pain in left knee: Secondary | ICD-10-CM | POA: Diagnosis not present

## 2021-12-20 DIAGNOSIS — M7042 Prepatellar bursitis, left knee: Secondary | ICD-10-CM

## 2021-12-20 DIAGNOSIS — R262 Difficulty in walking, not elsewhere classified: Secondary | ICD-10-CM | POA: Diagnosis not present

## 2021-12-20 DIAGNOSIS — M25662 Stiffness of left knee, not elsewhere classified: Secondary | ICD-10-CM | POA: Diagnosis not present

## 2021-12-20 MED ORDER — FLUCONAZOLE 150 MG PO TABS: 150.0000 mg | ORAL_TABLET | ORAL | 3 refills | Status: DC

## 2021-12-20 NOTE — Therapy (Signed)
[facility] [address]  [place], [place], [address] Phone: [phone]   Fax:  [phone]  Physical Therapy Treatment / Discharge Summary  Patient Details  Name: Shirley [name] MRN: [id number] Date of Birth: 08/10/1976 Referring Provider (PT): Rosemarie Ax, MD  Progress Note  Reporting Period 12/08/2021 to 12/20/2021  See note below for Objective Data and Assessment of Progress/Goals.     Encounter Date: 12/20/2021   PT End of Session - 12/20/21 0813     Visit Number 10    Number of Visits 13    Date for PT Re-Evaluation 12/20/21    Authorization Type [facility] Medicaid    Authorization Time Period 11/11/21-12/23/21    Authorization - Visit Number 9    Authorization - Number of Visits 12    PT Start Time 0813   Pt arrived late   PT Stop Time 0843    PT Time Calculation (min) 30 min    Activity Tolerance Patient tolerated treatment well    Behavior During Therapy [facility] for tasks assessed/performed             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   ANKLE PAIN, LEFT 06/26/2008   BACK PAIN, LUMBAR 10/15/2007   BIPOLAR DISORDER UNSPECIFIED 09/13/2007   CERVICAL RADICULOPATHY, LEFT 06/26/2008   Cervicalgia 10/15/2007   Chronic pain syndrome 03/21/2011   COMMON MIGRAINE 06/26/2008   DIABETES MELLITUS, TYPE II 06/05/2007   DYSPNEA 12/05/2007   EAR PAIN, RIGHT 12/05/2007   ELEVATED BP READING WITHOUT DX HYPERTENSION 07/29/2009   FATIGUE 12/05/2007   FIBROMYALGIA 06/26/2008   GASTROENTERITIS, ACUTE 12/15/2008   GERD 06/26/2008   Headache(784.0) 09/13/2007   HYPERLIPIDEMIA 09/13/2007   JOINT EFFUSION, RIGHT KNEE 07/29/2009   KNEE PAIN, LEFT 11/21/2010   LUMBAR RADICULOPATHY, LEFT 06/26/2008   OTHER DISEASE OF PHARYNX OR NASOPHARYNX 07/03/2008   PERIPHERAL EDEMA 01/06/2009    POLYARTHRITIS 11/21/2010   SHOULDER PAIN, BILATERAL 07/03/2008   SINUSITIS- ACUTE-NOS 07/29/2009   TACHYCARDIA 12/05/2007   THYROID NODULE, RIGHT 11/20/2008   TREMOR 01/02/2008   Vertigo     Past Surgical History:  Procedure Laterality Date   back surgury     lumbar 2001   CESAREAN SECTION     x 3   thyroid fine needle aspiration  May 2008   showed non neoplastic goiter   VAGINAL HYSTERECTOMY     fibroids    There were no vitals filed for this visit.   Subjective Assessment - 12/20/21 0815     Subjective Pt denies pain today but does note some muscle soreness. She feels ready to try transitioning to her HEP as of today.    Patient Stated Goals "To have better flexibility so I can squat comfortably w/o pain."    Currently in Pain? No/denies                [facility] PT Assessment - 12/20/21 0813       Assessment   Medical Diagnosis L knee patellar tendinitis & prepatellar bursitis    Referring Provider (PT) Rosemarie Ax, MD    Onset Date/Surgical Date 08/29/21    Next MD Visit 12/20/21      Observation/Other Assessments   Focus on Therapeutic Outcomes (FOTO)  Knee = 99 (37 point improvement from eval, exceeding predicted D/C FS of  67)      Strength   Right Hip Flexion 4+/5    Right Hip Extension 5/5    Right Hip External Rotation  4+/5    Right Hip Internal Rotation 5/5    Right Hip ABduction 4+/5    Right Hip ADduction 4+/5    Left Hip Flexion 4+/5    Left Hip Extension 5/5    Left Hip External Rotation 4+/5    Left Hip Internal Rotation 5/5    Left Hip ABduction 4+/5    Left Hip ADduction 4+/5    Right Knee Flexion 5/5    Right Knee Extension 5/5    Left Knee Flexion 5/5    Left Knee Extension 5/5    Right Ankle Dorsiflexion 5/5    Right Ankle Plantar Flexion 5/5    Left Ankle Dorsiflexion 5/5    Left Ankle Plantar Flexion 5/5                           [facility] Adult PT Treatment/Exercise - 12/20/21 0813       Ambulation/Gait   Stairs  Yes    Stairs Assistance 7: Independent    Stair Management Technique No rails;Alternating pattern;Forwards    Number of Stairs 14    Height of Stairs 7      Knee/Hip Exercises: Aerobic   Recumbent Bike L3 x 6 min                       PT Short Term Goals - 11/29/21 0903       PT SHORT TERM GOAL #1   Title Patient to be independent with initial HEP    Status Achieved   11/29/21   Target Date 11/29/21               PT Long Term Goals - 12/20/21 0817       PT LONG TERM GOAL #1   Title Patient will be independent with ongoing/advanced HEP for self-management at home    Status Achieved   12/20/21     PT LONG TERM GOAL #2   Title Patient to demonstrate L knee AROM WFL and without pain limiting    Status Achieved   12/08/21     PT LONG TERM GOAL #3   Title Patient will stand with good quad control, avoiding genu recurvatum to reduce anterior knee pressure    Status Achieved   12/20/21     PT LONG TERM GOAL #4   Title Patient will demonstrate improved B LE strength to >/= 4+/5 for improved stability and ease of mobility    Status Achieved   12/20/21 - B LE symmetrical and at least 4+/5     PT LONG TERM GOAL #5   Title Patient to demonstrate lifting 10lb box from floor using squat with good body mechanics and no pain    Status Achieved   12/08/21     PT LONG TERM GOAL #6   Title Patient to report ability to perform ADLs, household, and work-related tasks without limitation due to L knee pain, LOM or weakness    Status Achieved   12/20/21                  Plan - 12/20/21 0840     Clinical Impression Statement Delois is very pleased with her progress noting pain much improved allowing return to normal activity w/o pain interference, but still feels  that she has to build her endurance back up. Her L knee ROM is now WNL w/o pain and her B LE strength B is now symmetrical and at least 4+/5. She notes better awareness of her posture in standing and now  consciously avoids standing with knees locked in hyperextension/genu recurvatum. She reports no further limitations in normal daily activities and is now able to lift and carry things w/o increased pain including up and down stairs. She feel confident with her HEP and denies need for further review. All goals met and patient ready to transition to her HEP, therefore will proceed with discharge from PT for this episode.    Comorbidities LBP, lumbar radiculopathy, thoracic/upper back pain, cervicalgia, R Achilles tendinitis, R plantar fasciitis, fibromyalgia, DM-II, GERD, OA    PT Treatment/Interventions ADLs/Self Care Home Management;Cryotherapy;Ultrasound;Gait training;Stair training;Functional mobility training;Therapeutic activities;Therapeutic exercise;Balance training;Neuromuscular re-education;Manual techniques;Passive range of motion;Dry needling;Taping;Vasopneumatic Device;Joint Manipulations    PT Next Visit Plan Discharge    PT Home Exercise Plan Access Code: [email or web address] (1/20, updated 1/27 & 2/20)    Consulted and Agree with Plan of Care Patient             Patient will benefit from skilled therapeutic intervention in order to improve the following deficits and impairments:  Decreased activity tolerance, Decreased balance, Decreased mobility, Decreased range of motion, Decreased strength, Difficulty walking, Increased edema, Increased fascial restricitons, Increased muscle spasms, Impaired perceived functional ability, Impaired flexibility, Improper body mechanics, Postural dysfunction, Pain  Visit Diagnosis: Acute pain of left knee  Stiffness of left knee, not elsewhere classified  Muscle weakness (generalized)  Difficulty in walking, not elsewhere classified  Localized edema     Problem List Patient Active Problem List   Diagnosis Date Noted   Patellar tendinitis of left knee 08/30/2021   Prepatellar bursitis of left knee 08/30/2021   Lumbar radiculopathy 08/30/2021    Achilles tendinitis of right lower extremity 04/07/2021   Plantar fasciitis, right 03/17/2021   Ankle impingement syndrome, right 03/01/2021   Metatarsalgia of right foot 03/01/2021   COVID-19 virus infection 11/24/2020   ADHD (attention deficit hyperactivity disorder)    Toe injury, left, initial encounter 05/22/2019   MRSA (methicillin resistant staph aureus) urine culture positive on 07/06/16 07/06/2016   Thoracic back pain 12/20/2015   Low back pain radiating to right leg 12/01/2015   Shoulder impingement syndrome, left 11/07/2013   Encounter for long-term (current) use of high-risk medication 05/29/2011   Chronic pain syndrome 03/21/2011   SINUSITIS- ACUTE-NOS 07/29/2009   JOINT EFFUSION, RIGHT KNEE 07/29/2009   ELEVATED BP READING WITHOUT DX HYPERTENSION 07/29/2009   THYROID NODULE, RIGHT 11/20/2008   GERD 06/26/2008   Fibromyalgia 06/26/2008   ANEMIA-IRON DEFICIENCY 06/26/2008   DYSPNEA 12/05/2007   Neck pain 10/15/2007   Cervicalgia 10/15/2007   Hyperlipidemia 09/13/2007   BIPOLAR DISORDER UNSPECIFIED 09/13/2007   Type 2 diabetes mellitus without complication, without long-term current use of insulin ([place]) 06/05/2007    PHYSICAL THERAPY DISCHARGE SUMMARY  Visits from Start of Care: 10  Current functional level related to goals / functional outcomes:   Refer to above clinical impression and goal assessment.   Remaining deficits:   None.   Education / Equipment:   HEP   Patient agrees to discharge. Patient goals were met. Patient is being discharged due to meeting the stated rehab goals.   Percival Spanish, PT 12/20/2021, 8:43 AM  [facility] [address]  [address] [place],  [place], [address] Phone: [phone]   Fax:  [phone]  Name: CAILAH REACH MRN: [id number] Date of Birth: [birth year]

## 2021-12-20 NOTE — Assessment & Plan Note (Signed)
Has done well with physical therapy and denies any pain today.  Following her injury that she sustained at [place] on home exercise therapy and supportive care. -Follow-up as needed.

## 2021-12-20 NOTE — Progress Notes (Signed)
°  Shirley [name] - 46 y.o. female MRN [id number]  Date of birth: 1976/03/01  SUBJECTIVE:  Including CC & ROS.  No chief complaint on file.   Shirley [name] is a 46 y.o. female that is following up for a left knee injury she sustained at [facility].  She has completed physical therapy and denies any symptoms today.   Review of Systems See HPI   HISTORY: Past Medical, Surgical, Social, and Family History Reviewed & Updated per EMR.   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   ANKLE PAIN, LEFT 06/26/2008   BACK PAIN, LUMBAR 10/15/2007   BIPOLAR DISORDER UNSPECIFIED 09/13/2007   CERVICAL RADICULOPATHY, LEFT 06/26/2008   Cervicalgia 10/15/2007   Chronic pain syndrome 03/21/2011   COMMON MIGRAINE 06/26/2008   DIABETES MELLITUS, TYPE II 06/05/2007   DYSPNEA 12/05/2007   EAR PAIN, RIGHT 12/05/2007   ELEVATED BP READING WITHOUT DX HYPERTENSION 07/29/2009   FATIGUE 12/05/2007   FIBROMYALGIA 06/26/2008   GASTROENTERITIS, ACUTE 12/15/2008   GERD 06/26/2008   Headache(784.0) 09/13/2007   HYPERLIPIDEMIA 09/13/2007   JOINT EFFUSION, RIGHT KNEE 07/29/2009   KNEE PAIN, LEFT 11/21/2010   LUMBAR RADICULOPATHY, LEFT 06/26/2008   OTHER DISEASE OF PHARYNX OR NASOPHARYNX 07/03/2008   PERIPHERAL EDEMA 01/06/2009   POLYARTHRITIS 11/21/2010   SHOULDER PAIN, BILATERAL 07/03/2008   SINUSITIS- ACUTE-NOS 07/29/2009   TACHYCARDIA 12/05/2007   THYROID NODULE, RIGHT 11/20/2008   TREMOR 01/02/2008   Vertigo     Past Surgical History:  Procedure Laterality Date   back surgury     lumbar 2001   CESAREAN SECTION     x 3   thyroid fine needle aspiration  May 2008   showed non neoplastic goiter   VAGINAL HYSTERECTOMY     fibroids     PHYSICAL EXAM:  VS: BP 118/86 (BP Location: Left Arm, Patient Position: Sitting)    Ht 5\' 4"  (1.626 m)    Wt 204  lb (92.5 kg)    BMI 35.02 kg/m  Physical Exam Gen: NAD, alert, cooperative with exam, well-appearing MSK:  Neurovascularly intact       ASSESSMENT & PLAN:   Prepatellar bursitis of left knee Has done well with physical therapy and denies any pain today.  Following her injury that she sustained at [place] on home exercise therapy and supportive care. -Follow-up as needed.

## 2022-01-04 ENCOUNTER — Ambulatory Visit: Payer: Self-pay | Admitting: Cardiology

## 2022-01-04 ENCOUNTER — Telehealth: Payer: Self-pay | Admitting: Family Medicine

## 2022-01-04 NOTE — Telephone Encounter (Signed)
Patient called states forgot to ask provider for medical clearance letter-- She has completed PT & no need for addt'l OV follow ups-- Is ready to settle w/ Ins Co over fall @ [employer] & needs letter stating. ? ?---Forwarding message to med asst to review w/provider. ?---Patient to pick up letter if ready by Thursday 3/16. ? ?-glh ?

## 2022-01-04 NOTE — Telephone Encounter (Signed)
Letter sent through mychart.  ? ?Rosemarie Ax, MD ?[facility] ?01/04/2022, 4:27 PM ? ?

## 2022-01-05 ENCOUNTER — Ambulatory Visit: Payer: Medicaid Other | Admitting: Cardiology

## 2022-01-30 ENCOUNTER — Ambulatory Visit (INDEPENDENT_AMBULATORY_CARE_PROVIDER_SITE_OTHER): Payer: Medicaid Other | Admitting: Medical

## 2022-01-30 VITALS — BP 120/80 | HR 100 | Resp 18 | Ht 64.0 in | Wt 201.0 lb

## 2022-01-30 DIAGNOSIS — B001 Herpesviral vesicular dermatitis: Secondary | ICD-10-CM

## 2022-01-30 DIAGNOSIS — R21 Rash and other nonspecific skin eruption: Secondary | ICD-10-CM

## 2022-01-30 DIAGNOSIS — L299 Pruritus, unspecified: Secondary | ICD-10-CM

## 2022-01-30 MED ORDER — FAMCICLOVIR 500 MG PO TABS: 500.0000 mg | ORAL_TABLET | Freq: Three times a day (TID) | ORAL | 0 refills | Status: DC

## 2022-01-30 NOTE — Progress Notes (Signed)
? ?Subjective:  ? ? Patient ID: Shirley [name], female    DOB: 05/03/1976, 46 y.o.   MRN: [id number] ? ?HPI ? ? ?Pt states she has skin irritation to both arms for past 12 years. She states will typically get the bumps in the summer. The areas itch severely and she states will hair brush at times. Pt states applied sun screen to area at time, calamine and antihistamine. Pt use dove moisturizing soap. ? ?Also lower lip left side blister on and off for 3 months. ? ? ? ? ?Review of Systems  ?Constitutional:  Negative for chills, fatigue and fever.  ?Respiratory:  Negative for cough and chest tightness.   ?Cardiovascular:  Negative for chest pain and palpitations.  ?Gastrointestinal:  Negative for abdominal pain.  ?Genitourinary:  Negative for dysuria and flank pain.  ?Musculoskeletal:  Negative for arthralgias, back pain and gait problem.  ?Skin:  Negative for rash.  ?Neurological:  Negative for dizziness, tremors, speech difficulty, weakness, light-headedness and headaches.  ?Hematological:  Negative for adenopathy. Does not bruise/bleed easily.  ?Psychiatric/Behavioral:  Negative for agitation, confusion, dysphoric mood and hallucinations. The patient is not hyperactive.   ? ?Past Medical History:  ?Diagnosis Date  ? ADHD (attention deficit hyperactivity disorder)   ? ALLERGIC RHINITIS 06/26/2008  ? ANEMIA-IRON DEFICIENCY 06/26/2008  ? 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  ? ANKLE PAIN, LEFT 06/26/2008  ? BACK PAIN, LUMBAR 10/15/2007  ? BIPOLAR DISORDER UNSPECIFIED 09/13/2007  ? CERVICAL RADICULOPATHY, LEFT 06/26/2008  ? Cervicalgia 10/15/2007  ? Chronic pain syndrome 03/21/2011  ? COMMON MIGRAINE 06/26/2008  ? DIABETES MELLITUS, TYPE II 06/05/2007  ? DYSPNEA 12/05/2007  ? EAR PAIN, RIGHT 12/05/2007  ? ELEVATED BP READING WITHOUT DX HYPERTENSION 07/29/2009  ? FATIGUE 12/05/2007  ? FIBROMYALGIA 06/26/2008  ? GASTROENTERITIS, ACUTE 12/15/2008  ? GERD 06/26/2008  ? Headache(784.0) 09/13/2007  ? HYPERLIPIDEMIA 09/13/2007  ? JOINT EFFUSION, RIGHT KNEE  07/29/2009  ? KNEE PAIN, LEFT 11/21/2010  ? LUMBAR RADICULOPATHY, LEFT 06/26/2008  ? OTHER DISEASE OF PHARYNX OR NASOPHARYNX 07/03/2008  ? PERIPHERAL EDEMA 01/06/2009  ? POLYARTHRITIS 11/21/2010  ? SHOULDER PAIN, BILATERAL 07/03/2008  ? SINUSITIS- ACUTE-NOS 07/29/2009  ? TACHYCARDIA 12/05/2007  ? THYROID NODULE, RIGHT 11/20/2008  ? TREMOR 01/02/2008  ? Vertigo   ? ?  ?Social History  ? ?Socioeconomic History  ? Marital status: Significant Other  ?  Spouse name: Not on file  ? Number of children: 3  ? Years of education: Not on file  ? Highest education level: Not on file  ?Occupational History  ?  Employer: UNEMPLOYED  ?Tobacco Use  ? Smoking status: Never  ? Smokeless tobacco: Never  ?Vaping Use  ? Vaping Use: Never used  ?Substance and Sexual Activity  ? Alcohol use: No  ? Drug use: No  ? Sexual activity: Not on file  ?Other Topics Concern  ? Not on file  ?Social History Narrative  ? Not on file  ? ?Social Determinants of Health  ? ?Financial Resource Strain: Not on file  ?Food Insecurity: Not on file  ?Transportation Needs: Not on file  ?Physical Activity: Not on file  ?Stress: Not on file  ?Social Connections: Not on file  ?Intimate Partner Violence: Not on file  ? ? ?Past Surgical History:  ?Procedure Laterality Date  ? back surgury    ? lumbar 2001  ? CESAREAN SECTION    ? x 3  ? thyroid fine needle aspiration  May 2008  ? showed non neoplastic goiter  ? VAGINAL  HYSTERECTOMY    ? fibroids  ? ? ?Family History  ?Problem Relation Age of Onset  ? Thyroid disease Mother   ? Diabetes Mother   ? Asthma Mother   ? Hypertension Father   ? Hyperlipidemia Father   ? Diabetes Father   ? Emphysema Other   ? Coronary artery disease Other   ? Cancer Other   ?     pancreatic and breast cancer  ? Cancer Other   ?     lung and prostate cancer  ? Breast cancer Neg Hx   ? ? ?Allergies  ?Allergen Reactions  ? Sulfa Antibiotics Anaphylaxis  ? Contrast Media [Iodinated Contrast Media] Itching  ?  Mri, severe heaving ~ can take benadryl without  reaction  ? Gadolinium Derivatives Itching  ?  Severe heaving- can take benadryl without having reaction  ? Promethazine Hcl   ?  Iv dose with benadryl causes severe aggrivation  ? Reglan [Metoclopramide]   ?  Makes me very aggitated  ? ? ?Current Outpatient Medications on File Prior to Visit  ?Medication Sig Dispense Refill  ? busPIRone (BUSPAR) 7.5 MG tablet Take 1 tablet (7.5 mg total) by mouth 2 (two) times daily. 60 tablet 0  ? conjugated estrogens (PREMARIN) vaginal cream Place 1 Applicatorful vaginally daily. 42.5 g 12  ? cyclobenzaprine (FLEXERIL) 5 MG tablet 1 tab po q hs prn tension ha 3 tablet 0  ? diclofenac Sodium (VOLTAREN) 1 % GEL Apply 4 g topically 4 (four) times daily. To affected joint. (Patient taking differently: Apply 4 g topically 4 (four) times daily. To affected joint PRN) 100 g 11  ? diltiazem (CARDIZEM) 30 MG tablet Take 30 mg by mouth 2 (two) times daily.    ? famotidine (PEPCID) 20 MG tablet TAKE 1 TABLET BY MOUTH 2 TIMES DAILY (Patient taking differently: Take 20 mg by mouth 2 (two) times daily as needed.) 60 tablet 0  ? fluconazole (DIFLUCAN) 150 MG tablet Take 1 tablet (150 mg total) by mouth every 3 (three) days. For three doses 4 tablet 3  ? meloxicam (MOBIC) 15 MG tablet TAKE ONE TABLET BY MOUTH EVERY MORNING WITH BREAKFAST FOR 2 WEEKS , THEN DAILY AS NEEDED FOR PAIN 30 tablet 3  ? nitroGLYCERIN (NITRODUR - DOSED IN MG/24 HR) 0.2 mg/hr patch Cut and apply 1/4 patch to most painful area q24h. 30 patch 11  ? progesterone (PROMETRIUM) 200 MG capsule Take 1 capsule (200 mg total) by mouth daily. 90 capsule 3  ? sitaGLIPtin (JANUVIA) 100 MG tablet Take 1 tablet (100 mg total) by mouth daily. 30 tablet 3  ? carvedilol (COREG) 12.5 MG tablet Take 1 tablet (12.5 mg total) by mouth 2 (two) times daily. 180 tablet 3  ? fluticasone (FLONASE) 50 MCG/ACT nasal spray Place 2 sprays into both nostrils daily for 14 days. (Patient taking differently: Place 2 sprays into both nostrils daily as  needed for allergies or rhinitis.) 11.1 mL 0  ? rosuvastatin (CRESTOR) 20 MG tablet Take 1 tablet (20 mg total) by mouth daily. 90 tablet 3  ? ?No current facility-administered medications on file prior to visit.  ? ? ?BP 120/80   Pulse 100   Resp 18   Ht '5\' 4"'$  (1.626 m)   Wt 201 lb (91.2 kg)   SpO2 100%   BMI 34.50 kg/m?  ?  ? ?   ?Objective:  ? Physical Exam ? ?General- No acute distress. Pleasant patient. ?Neck- Full range of motion, no jvd ?  Lungs- Clear, even and unlabored. ?Heart- regular rate and rhythm. ?Neurologic- CNII- XII grossly intact.  ?Skin- both forearms faint rough raised rash. No warmth.  ?Lips- left side lower lip moderate swollen with crusting appearance. ? ?   ?Assessment & Plan:  ? ?Patient Instructions  ?For bilateral rash to forearms recommend try palmers moisturizer with vit E. Lubriderm and aveeno options. For light rash that itches can use hydrocortisone over the counter. If more severe rash would recommend triamcinolone. Continue dove moisturizing soaps. ? ?For cold sore rx famvir. Take picture of your lower lip today in event need to show dermatologist. ? ?Referral to dermatologist placed. Hopefully won't take too long but can take months at times. ? ?Follow up 2 weeks or sooner if needed.  ? ? ?Mackie Pai, PA-C  ? ?Time spent with patient today was 21  minutes which consisted of chart revdiew, discussing diagnosis, work up treatment and documentation.  ?

## 2022-01-30 NOTE — Patient Instructions (Addendum)
For bilateral rash to forearms recommend try palmers moisturizer with vit E. Lubriderm and aveeno options. For light rash that itches can use hydrocortisone over the counter. If more severe rash would recommend triamcinolone. Continue dove moisturizing soaps. Consevative measures for eczema. ? ?For cold sore rx famvir. Take picture of your lower lip today in event need to show dermatologist. ? ?Referral to dermatologist placed. Hopefully won't take too long but can take months at times. ? ?Follow up 2 weeks or sooner if needed. ?

## 2022-02-13 ENCOUNTER — Ambulatory Visit (INDEPENDENT_AMBULATORY_CARE_PROVIDER_SITE_OTHER): Payer: Medicaid Other | Admitting: Medical

## 2022-02-13 VITALS — BP 130/70 | HR 100 | Resp 18 | Ht 64.0 in | Wt 199.6 lb

## 2022-02-13 DIAGNOSIS — E119 Type 2 diabetes mellitus without complications: Secondary | ICD-10-CM | POA: Diagnosis not present

## 2022-02-13 DIAGNOSIS — F419 Anxiety disorder, unspecified: Secondary | ICD-10-CM | POA: Diagnosis not present

## 2022-02-13 DIAGNOSIS — R21 Rash and other nonspecific skin eruption: Secondary | ICD-10-CM | POA: Diagnosis not present

## 2022-02-13 DIAGNOSIS — I10 Essential (primary) hypertension: Secondary | ICD-10-CM | POA: Diagnosis not present

## 2022-02-13 DIAGNOSIS — L299 Pruritus, unspecified: Secondary | ICD-10-CM | POA: Diagnosis not present

## 2022-02-13 MED ORDER — HYDROXYZINE HCL 10 MG PO TABS: 10.0000 mg | ORAL_TABLET | Freq: Three times a day (TID) | ORAL | 0 refills | Status: DC | PRN

## 2022-02-13 MED ORDER — BUSPIRONE HCL 7.5 MG PO TABS: 7.5000 mg | ORAL_TABLET | Freq: Two times a day (BID) | ORAL | 0 refills | Status: DC

## 2022-02-13 NOTE — Patient Instructions (Addendum)
For bilateral forearm rash and itching continue with Palmers moisturizer lotion, hydrocortisone and dove moisturizing soap. Decided to add on low dose hydroxyzine to use if needed for severe itching. Rx advisement on hydroxyzine side effects. ? ? ?Placing new referral to dermatologist/one that accepts medicaid. Let me know if no one calls you in 10 days.  ? ? ?Diabetes- pt on januvia 100 mg daily. Get cmp and a1c today. Will review labs and refer to different practice. ? ?Follow up date to be determined after derm appt update ? ? ? ? ? ?

## 2022-02-13 NOTE — Progress Notes (Signed)
? ?  Subjective:  ? ? Patient ID: Shirley [name], female    DOB: 05-13-1976, 46 y.o.   MRN: [id number] ? ?HPI ? ?Pt in for follow up on her forearm rashes. She states her foreams are still sensitive and get rash but not as frequent. Still having desire to itch skin.  ? ?Last visit advised. ? ?"For bilateral rash to forearms recommend try palmers moisturizer with vit E. Lubriderm and aveeno options. For light rash that itches can use hydrocortisone over the counter. If more severe rash would recommend triamcinolone. Continue dove moisturizing soaps." ? ? ?I placed referral to dermatologist. Saw in epic that [place] dermatologist not accepting medicaid patients. ? ?Anxiety- buspar helps a lot. Request refill. ? ? ?Diabetes- on januvia. She had to miss 2 endocrine appt due to illness so never got referred. ? ?Review of Systems  ?Constitutional:  Negative for chills, fatigue and fever.  ?Respiratory:  Negative for cough, chest tightness and wheezing.   ?Cardiovascular:  Negative for chest pain and palpitations.  ?Musculoskeletal:  Negative for back pain.  ?Skin:  Positive for rash.  ?     With itching.  ?Psychiatric/Behavioral:  Negative for behavioral problems, confusion and self-injury. The patient is not nervous/anxious.   ? ?   ?Objective:  ? Physical Exam ? ?General- No acute distress. Pleasant patient. ?Neck- Full range of motion, no jvd ?Lungs- Clear, even and unlabored. ?Heart- regular rate and rhythm. ?Neurologic- CNII- XII grossly intact.  ?Skin- both forearms feel smooth today with no roughness. ? ? ?   ?Assessment & Plan:  ? ?Patient Instructions  ?For bilateral forearm rash and itching continue with Palmers moisturizer lotion, hydrocortisone and dove moisturizing soap. Decided to add on low dose hydroxyzine to use if needed for severe itching. Rx advisement on hydroxyzine side effects. ? ? ?Placing new referral to dermatologist/one that accepts medicaid. Let me know if no one calls you in 10 days.   ? ? ?Diabetes- pt on januvia 100 mg daily. Get cmp and a1c today. Will review labs and refer to different practice. ? ?Follow up date to be determined after derm appt update ? ? ? ? ? Mackie Pai, PA-C  ?

## 2022-03-03 NOTE — Addendum Note (Signed)
Addended by: Kelle Darting A on: 03/03/2022 09:10 AM ? ? Modules accepted: Orders ? ?

## 2022-03-06 ENCOUNTER — Telehealth: Payer: Self-pay | Admitting: *Deleted

## 2022-03-06 ENCOUNTER — Other Ambulatory Visit: Payer: Medicaid Other

## 2022-03-06 NOTE — Telephone Encounter (Signed)
Caller Name Shemiah Rosch ?Caller Phone Number [phone] ?Patient Name Shirley [name] ?Patient DOB 12-29-1975 ?Call Type Message Only Information Provided ?Reason for Call Request to [facility] Appointment ?Initial Comment Caller states she would like to cancel her appt. ?Patient request to speak to RN No ?Disp. Time Disposition Final User ?03/06/2022 7:14:47 AM General Information Provided Yes Jerl Mina ?

## 2022-03-09 ENCOUNTER — Encounter: Payer: Self-pay | Admitting: Family Medicine

## 2022-03-09 ENCOUNTER — Ambulatory Visit (INDEPENDENT_AMBULATORY_CARE_PROVIDER_SITE_OTHER): Payer: Medicaid Other | Admitting: Family Medicine

## 2022-03-09 VITALS — BP 124/87 | HR 105 | Ht 64.5 in | Wt 190.0 lb

## 2022-03-09 DIAGNOSIS — N952 Postmenopausal atrophic vaginitis: Secondary | ICD-10-CM

## 2022-03-09 DIAGNOSIS — D249 Benign neoplasm of unspecified breast: Secondary | ICD-10-CM | POA: Diagnosis not present

## 2022-03-09 DIAGNOSIS — Z01419 Encounter for gynecological examination (general) (routine) without abnormal findings: Secondary | ICD-10-CM | POA: Diagnosis not present

## 2022-03-09 NOTE — Progress Notes (Signed)
Patient presents for annual exam. Patient states she will call to schedule her mammogram patient states she has not done follow up like she should. Kathrene Alu RN

## 2022-03-09 NOTE — Progress Notes (Signed)
GYNECOLOGY ANNUAL PREVENTATIVE CARE ENCOUNTER NOTE  Subjective:   Shirley [name] is a 46 y.o. [phone] female here for a routine annual gynecologic exam.  Current complaints: Has been trying to change diet in order to decrease weight and improve blood sugars control.  She has had less discharge.  She is back on hormone therapy and is doing well.   Denies abnormal vaginal bleeding, discharge, pelvic pain, problems with intercourse or other gynecologic concerns.    Gynecologic History No LMP recorded. Patient has had a hysterectomy. Patient is sexually active  Contraception: status post hysterectomy Last Pap: N/A. Last mammogram: 2021. Results were: abnormal. Had biopsy that was fibroadenoma. Has not had followup US since then.  The pregnancy intention screening data noted above was reviewed. Potential methods of contraception were discussed. The patient elected to proceed with No data recorded.   Obstetric History OB History  Gravida Para Term Preterm AB Living  '3 3 2 1   3  '$ SAB IAB Ectopic Multiple Live Births          3    # Outcome Date GA Lbr Len/2nd Weight Sex Delivery Anes PTL Lv  3 Term     M Vag-Spont   LIV  2 Preterm     F CS-LTranv   LIV  1 Term      Vag-Spont   LIV    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   ANKLE PAIN, LEFT 06/26/2008   BACK PAIN, LUMBAR 10/15/2007   BIPOLAR DISORDER UNSPECIFIED 09/13/2007   CERVICAL RADICULOPATHY, LEFT 06/26/2008   Cervicalgia 10/15/2007   Chronic pain syndrome 03/21/2011   COMMON MIGRAINE 06/26/2008   DIABETES MELLITUS, TYPE II 06/05/2007   DYSPNEA 12/05/2007   EAR PAIN, RIGHT 12/05/2007   ELEVATED BP READING WITHOUT DX HYPERTENSION 07/29/2009   FATIGUE 12/05/2007   FIBROMYALGIA 06/26/2008   GASTROENTERITIS, ACUTE 12/15/2008   GERD 06/26/2008   Headache(784.0) 09/13/2007   HYPERLIPIDEMIA  09/13/2007   JOINT EFFUSION, RIGHT KNEE 07/29/2009   KNEE PAIN, LEFT 11/21/2010   LUMBAR RADICULOPATHY, LEFT 06/26/2008   OTHER DISEASE OF PHARYNX OR NASOPHARYNX 07/03/2008   PERIPHERAL EDEMA 01/06/2009   POLYARTHRITIS 11/21/2010   SHOULDER PAIN, BILATERAL 07/03/2008   SINUSITIS- ACUTE-NOS 07/29/2009   TACHYCARDIA 12/05/2007   THYROID NODULE, RIGHT 11/20/2008   TREMOR 01/02/2008   Vertigo     Past Surgical History:  Procedure Laterality Date   back surgury     lumbar 2001   CESAREAN SECTION     x 3   thyroid fine needle aspiration  May 2008   showed non neoplastic goiter   VAGINAL HYSTERECTOMY     fibroids    Current Outpatient Medications on File Prior to Visit  Medication Sig Dispense Refill   busPIRone (BUSPAR) 7.5 MG tablet Take 1 tablet (7.5 mg total) by mouth 2 (two) times daily. 60 tablet 0   conjugated estrogens (PREMARIN) vaginal cream Place 1 Applicatorful vaginally daily. 42.5 g 12   cyclobenzaprine (FLEXERIL) 5 MG tablet 1 tab po q hs prn tension ha 3 tablet 0   diclofenac Sodium (VOLTAREN) 1 % GEL Apply 4 g topically 4 (four) times daily. To affected joint. (Patient taking differently: Apply 4 g topically 4 (four) times daily. To affected joint PRN) 100 g 11   diltiazem (CARDIZEM) 30 MG tablet Take 30 mg by mouth 2 (  two) times daily.     famciclovir (FAMVIR) 500 MG tablet Take 1 tablet (500 mg total) by mouth 3 (three) times daily. 21 tablet 0   famotidine (PEPCID) 20 MG tablet TAKE 1 TABLET BY MOUTH 2 TIMES DAILY (Patient taking differently: Take 20 mg by mouth 2 (two) times daily as needed.) 60 tablet 0   fluconazole (DIFLUCAN) 150 MG tablet Take 1 tablet (150 mg total) by mouth every 3 (three) days. For three doses 4 tablet 3   hydrOXYzine (ATARAX) 10 MG tablet Take 1 tablet (10 mg total) by mouth 3 (three) times daily as needed for itching. 30 tablet 0   meloxicam (MOBIC) 15 MG tablet TAKE ONE TABLET BY MOUTH EVERY MORNING WITH BREAKFAST FOR 2 WEEKS , THEN DAILY AS NEEDED  FOR PAIN 30 tablet 3   nitroGLYCERIN (NITRODUR - DOSED IN MG/24 HR) 0.2 mg/hr patch Cut and apply 1/4 patch to most painful area q24h. 30 patch 11   progesterone (PROMETRIUM) 200 MG capsule Take 1 capsule (200 mg total) by mouth daily. 90 capsule 3   sitaGLIPtin (JANUVIA) 100 MG tablet Take 1 tablet (100 mg total) by mouth daily. 30 tablet 3   carvedilol (COREG) 12.5 MG tablet Take 1 tablet (12.5 mg total) by mouth 2 (two) times daily. 180 tablet 3   fluticasone (FLONASE) 50 MCG/ACT nasal spray Place 2 sprays into both nostrils daily for 14 days. (Patient taking differently: Place 2 sprays into both nostrils daily as needed for allergies or rhinitis.) 11.1 mL 0   rosuvastatin (CRESTOR) 20 MG tablet Take 1 tablet (20 mg total) by mouth daily. 90 tablet 3   No current facility-administered medications on file prior to visit.    Allergies  Allergen Reactions   Sulfa Antibiotics Anaphylaxis   Contrast Media [Iodinated Contrast Media] Itching    Mri, severe heaving ~ can take benadryl without reaction   Gadolinium Derivatives Itching    Severe heaving- can take benadryl without having reaction   Promethazine Hcl     Iv dose with benadryl causes severe aggrivation   Reglan [Metoclopramide]     Makes me very aggitated    Social History   Socioeconomic History   Marital status: Significant Other    Spouse name: Not on file   Number of children: 3   Years of education: Not on file   Highest education level: Not on file  Occupational History    Employer: UNEMPLOYED  Tobacco Use   Smoking status: Never   Smokeless tobacco: Never  Vaping Use   Vaping Use: Never used  Substance and Sexual Activity   Alcohol use: No   Drug use: No   Sexual activity: Not on file  Other Topics Concern   Not on file  Social History Narrative   Not on file   Social Determinants of Health   Financial Resource Strain: Not on file  Food Insecurity: Not on file  Transportation Needs: Not on file   Physical Activity: Not on file  Stress: Not on file  Social Connections: Not on file  Intimate Partner Violence: Not on file    Family History  Problem Relation Age of Onset   Thyroid disease Mother    Diabetes Mother    Asthma Mother    Hypertension Father    Hyperlipidemia Father    Diabetes Father    Emphysema Other    Coronary artery disease Other    Cancer Other        pancreatic and  breast cancer   Cancer Other        lung and prostate cancer   Breast cancer Neg Hx     The following portions of the patient's history were reviewed and updated as appropriate: allergies, current medications, past family history, past medical history, past social history, past surgical history and problem list.  Review of Systems Pertinent items are noted in HPI.   Objective:  BP 124/87   Pulse (!) 105   Ht 5' 4.5" (1.638 m)   Wt 190 lb (86.2 kg)   BMI 32.11 kg/m  Wt Readings from Last 3 Encounters:  03/09/22 190 lb (86.2 kg)  02/13/22 199 lb 9.6 oz (90.5 kg)  01/30/22 201 lb (91.2 kg)     Chaperone present during exam  CONSTITUTIONAL: Well-developed, well-nourished female in no acute distress.  HENT:  Normocephalic, atraumatic, External right and left ear normal. Oropharynx is clear and moist EYES: Conjunctivae and EOM are normal. Pupils are equal, round, and reactive to light. No scleral icterus.  NECK: Normal range of motion, supple, no masses.  Normal thyroid.   CARDIOVASCULAR: Normal heart rate noted, regular rhythm RESPIRATORY: Clear to auscultation bilaterally. Effort and breath sounds normal, no problems with respiration noted. BREASTS: Symmetric in size. No masses, skin changes, nipple drainage, or lymphadenopathy. ABDOMEN: Soft, normal bowel sounds, no distention noted.  No tenderness, rebound or guarding.  PELVIC: Normal appearing external genitalia; atrophic appearing vaginal mucosa and cervix.   MUSCULOSKELETAL: Normal range of motion. No tenderness.  No cyanosis,  clubbing, or edema.  2+ distal pulses. SKIN: Skin is warm and dry. No rash noted. Not diaphoretic. No erythema. No pallor. NEUROLOGIC: Alert and oriented to person, place, and time. Normal reflexes, muscle tone coordination. No cranial nerve deficit noted. PSYCHIATRIC: Normal mood and affect. Normal behavior. Normal judgment and thought content.  Assessment:  Annual gynecologic examination with pap smear   Plan:  1. Well Woman Exam Will follow up results of pap smear and manage accordingly. Mammogram scheduled  2. Breast fibroadenoma in female, unspecified laterality Discussed importance of mammogram. Patient to make appt.  3. Vaginal atrophy Continue HRT.  Routine preventative health maintenance measures emphasized. Please refer to After Visit Summary for other counseling recommendations.    Loma Boston, [place] for [employer]

## 2022-05-07 ENCOUNTER — Other Ambulatory Visit: Payer: Self-pay

## 2022-05-07 ENCOUNTER — Emergency Department (HOSPITAL_BASED_OUTPATIENT_CLINIC_OR_DEPARTMENT_OTHER)
Admission: EM | Admit: 2022-05-07 | Discharge: 2022-05-07 | Disposition: A | Discharge status: Home / Self Care | Payer: Medicaid Other | Attending: Emergency Medicine | Admitting: Emergency Medicine

## 2022-05-07 ENCOUNTER — Encounter (HOSPITAL_BASED_OUTPATIENT_CLINIC_OR_DEPARTMENT_OTHER): Payer: Self-pay | Admitting: Emergency Medicine

## 2022-05-07 ENCOUNTER — Emergency Department (HOSPITAL_BASED_OUTPATIENT_CLINIC_OR_DEPARTMENT_OTHER): Payer: Medicaid Other

## 2022-05-07 DIAGNOSIS — S62651A Nondisplaced fracture of medial phalanx of left index finger, initial encounter for closed fracture: Secondary | ICD-10-CM | POA: Diagnosis not present

## 2022-05-07 DIAGNOSIS — M79641 Pain in right hand: Secondary | ICD-10-CM | POA: Diagnosis not present

## 2022-05-07 DIAGNOSIS — W1839XA Other fall on same level, initial encounter: Secondary | ICD-10-CM | POA: Insufficient documentation

## 2022-05-07 DIAGNOSIS — S6992XA Unspecified injury of left wrist, hand and finger(s), initial encounter: Secondary | ICD-10-CM | POA: Diagnosis present

## 2022-05-07 DIAGNOSIS — Y9301 Activity, walking, marching and hiking: Secondary | ICD-10-CM | POA: Insufficient documentation

## 2022-05-07 DIAGNOSIS — W19XXXA Unspecified fall, initial encounter: Secondary | ICD-10-CM

## 2022-05-07 NOTE — Discharge Instructions (Signed)
You were seen in the emergency department for evaluation of right hand pain after a fall.  Your x-ray showed a fracture of the mid bone of your right index finger.  You were placed in a splint please keep this on clean and dry.  Tylenol and ibuprofen for pain.  Contact hand surgeon on Monday for follow-up.  Return if any worsening or concerning symptoms

## 2022-05-07 NOTE — ED Triage Notes (Signed)
States fell yesterday after tripping over the tip of her shoe. NO LOC, pain to right hand, swelling noted to knuckles. CMS intact

## 2022-05-07 NOTE — ED Provider Notes (Signed)
[facility] EMERGENCY DEPARTMENT Provider Note   CSN: [id number] Arrival date & time: 05/07/22  0749     History  Chief Complaint  Patient presents with   Shirley [name]    Shirley [name] is a 46 y.o. female.  She is left-hand dominant.  She had a mechanical fall yesterday while walking.  Injury to right hand.  Felt like her fingers bent backwards when she fell.  Complaining of severe pain over her knuckles and the palm of her hand worse with movement.  She has been using ice with some improvement.  No numbness.  Denies other injuries or complaints.  The history is provided by the patient.  Hand Injury Location:  Hand Hand location:  R hand Injury: yes   Time since incident:  1 day Mechanism of injury: fall   Fall:    Fall occurred:  Walking   Point of impact:  Hands Pain details:    Quality:  Throbbing   Severity:  Severe   Onset quality:  Sudden   Timing:  Constant   Progression:  Unchanged Handedness:  Left-handed Relieved by:  Nothing Worsened by:  Movement Ineffective treatments:  Ice Associated symptoms: no back pain, no neck pain and no numbness        Home Medications Prior to Admission medications   Medication Sig Start Date End Date Taking? Authorizing Provider  busPIRone (BUSPAR) 7.5 MG tablet Take 1 tablet (7.5 mg total) by mouth 2 (two) times daily. 02/13/22   Saguier, Percell Miller, PA-C  carvedilol (COREG) 12.5 MG tablet Take 1 tablet (12.5 mg total) by mouth 2 (two) times daily. 07/19/21 10/17/21  Park Liter, MD  conjugated estrogens (PREMARIN) vaginal cream Place 1 Applicatorful vaginally daily. 10/21/21   Truett Mainland, DO  cyclobenzaprine (FLEXERIL) 5 MG tablet 1 tab po q hs prn tension ha 07/25/21   Saguier, Percell Miller, PA-C  diclofenac Sodium (VOLTAREN) 1 % GEL Apply 4 g topically 4 (four) times daily. To affected joint. Patient taking differently: Apply 4 g topically 4 (four) times daily. To affected joint PRN 04/07/21   Rosemarie Ax, MD   diltiazem (CARDIZEM) 30 MG tablet Take 30 mg by mouth 2 (two) times daily. 08/02/21   [provider]  famciclovir (FAMVIR) 500 MG tablet Take 1 tablet (500 mg total) by mouth 3 (three) times daily. 01/30/22   Saguier, Percell Miller, PA-C  famotidine (PEPCID) 20 MG tablet TAKE 1 TABLET BY MOUTH 2 TIMES DAILY Patient taking differently: Take 20 mg by mouth 2 (two) times daily as needed. 08/15/21   Saguier, Percell Miller, PA-C  fluconazole (DIFLUCAN) 150 MG tablet Take 1 tablet (150 mg total) by mouth every 3 (three) days. For three doses 12/20/21   Truett Mainland, DO  fluticasone (FLONASE) 50 MCG/ACT nasal spray Place 2 sprays into both nostrils daily for 14 days. Patient taking differently: Place 2 sprays into both nostrils daily as needed for allergies or rhinitis. 11/09/20 08/03/21  Tedd Sias, PA  hydrOXYzine (ATARAX) 10 MG tablet Take 1 tablet (10 mg total) by mouth 3 (three) times daily as needed for itching. 02/13/22   Saguier, Percell Miller, PA-C  meloxicam (MOBIC) 15 MG tablet TAKE ONE TABLET BY MOUTH EVERY MORNING WITH BREAKFAST FOR 2 WEEKS , THEN DAILY AS NEEDED FOR PAIN 10/03/21   Rosemarie Ax, MD  nitroGLYCERIN (NITRODUR - DOSED IN MG/24 HR) 0.2 mg/hr patch Cut and apply 1/4 patch to most painful area q24h. 10/27/21   Rosemarie Ax, MD  progesterone (PROMETRIUM) 200 MG capsule Take 1 capsule (200 mg total) by mouth daily. 10/20/21   Truett Mainland, DO  rosuvastatin (CRESTOR) 20 MG tablet Take 1 tablet (20 mg total) by mouth daily. 11/24/20 08/03/21  Richardo Priest, MD  sitaGLIPtin (JANUVIA) 100 MG tablet Take 1 tablet (100 mg total) by mouth daily. 07/25/21   Saguier, Percell Miller, PA-C      Allergies    Sulfa antibiotics, Contrast media [iodinated contrast media], Gadolinium derivatives, Promethazine hcl, and Reglan [metoclopramide]    Review of Systems   Review of Systems  Musculoskeletal:  Negative for back pain and neck pain.  Skin:  Negative for wound.  Neurological:  Negative for  weakness and numbness.    Physical Exam Updated Vital Signs BP (!) 147/89 (BP Location: Left Arm)   Pulse (!) 101   Temp 97.7 F (36.5 C) (Oral)   Resp 18   Ht '5\' 4"'$  (1.626 m)   Wt 86.2 kg   SpO2 100%   BMI 32.61 kg/m  Physical Exam Constitutional:      Appearance: Normal appearance. She is well-developed.  HENT:     Head: Normocephalic and atraumatic.  Eyes:     Conjunctiva/sclera: Conjunctivae normal.  Musculoskeletal:        General: Tenderness present.     Cervical back: Neck supple.     Comments: Right upper extremity nontender shoulder elbow and wrist.  She is diffusely tender across her mid hand including her carpometacarpal's 4 over #2 through 4 and her PIPs 2 through 4.  There are some mild swelling over the dorsum of her hand.  No open wounds or ecchymosis.  Cap refill sensory and motor intact.  Skin:    General: Skin is warm and dry.  Neurological:     General: No focal deficit present.     Mental Status: She is alert.     GCS: GCS eye subscore is 4. GCS verbal subscore is 5. GCS motor subscore is 6.     Sensory: No sensory deficit.     Motor: No weakness.     ED Results / Procedures / Treatments   Labs (all labs ordered are listed, but only abnormal results are displayed) Labs Reviewed - No data to display  EKG None  Radiology DG Hand Complete Right  Result Date: 05/07/2022 CLINICAL DATA:  Acute RIGHT hand pain following fall yesterday. Initial encounter. EXAM: RIGHT HAND - COMPLETE 3 VIEW COMPARISON:  None Available. FINDINGS: There is a nondisplaced fracture at the base of the index finger middle phalanx noted. On the LATERAL view this fracture may extend distally and vertically. No other fracture, subluxation or dislocation identified. No focal bony lesions are identified. IMPRESSION: Nondisplaced fracture at the base of the index finger middle phalanx, possibly extending distally. Electronically Signed   By: Margarette Canada M.D.   On: 05/07/2022 08:44     Procedures Procedures    Medications Ordered in ED Medications - No data to display  ED Course/ Medical Decision Making/ A&P                           Medical Decision Making Amount and/or Complexity of Data Reviewed Radiology: ordered.  46 year old female here with right hand pain after a fall yesterday.  She is diffusely tender and has some swelling over the dorsum of her hand.  X-rays ordered interpreted by me show fracture of second digit middle phalanx base.  Otherwise no  obvious fractures or dislocations.  Patient placed in volar splint.  Given contact information for outpatient hand follow-up.  Return instructions discussed.         Final Clinical Impression(s) / ED Diagnoses Final diagnoses:  Nondisplaced fracture of middle phalanx of left index finger, initial encounter for closed fracture  Fall, initial encounter    Rx / DC Orders ED Discharge Orders     None         Hayden Rasmussen, MD 05/07/22 1753

## 2022-05-25 ENCOUNTER — Encounter: Payer: Self-pay | Admitting: Family Medicine

## 2022-05-25 ENCOUNTER — Ambulatory Visit (INDEPENDENT_AMBULATORY_CARE_PROVIDER_SITE_OTHER): Payer: Medicaid Other | Admitting: Family Medicine

## 2022-05-25 ENCOUNTER — Ambulatory Visit (HOSPITAL_BASED_OUTPATIENT_CLINIC_OR_DEPARTMENT_OTHER)
Admission: RE | Admit: 2022-05-25 | Discharge: 2022-05-25 | Disposition: A | Discharge status: Home / Self Care | Payer: Medicaid Other | Source: Ambulatory Visit | Attending: Family Medicine | Admitting: Family Medicine

## 2022-05-25 VITALS — BP 120/80 | Ht 64.0 in | Wt 190.0 lb

## 2022-05-25 DIAGNOSIS — S62650A Nondisplaced fracture of medial phalanx of right index finger, initial encounter for closed fracture: Secondary | ICD-10-CM

## 2022-05-25 HISTORY — DX: Nondisplaced fracture of middle phalanx of right index finger, initial encounter for closed fracture: S62.650A

## 2022-05-25 NOTE — Assessment & Plan Note (Signed)
Acutely occurring.  Initial injury on 7/15.  Had a fall with subsequent fracture appreciated.  No malrotation or misalignment.  Limited flexion today. -Counseled on home exercise therapy and supportive care. -Counseled on buddy taping. -X-ray. -Follow-up in 3 weeks.

## 2022-05-25 NOTE — Progress Notes (Signed)
  BRYAHNA LESKO - 46 y.o. female MRN [id number]  Date of birth: 04-21-1976  SUBJECTIVE:  Including CC & ROS.  No chief complaint on file.   COLETTA LOCKNER is a 46 y.o. female that is presenting with acute right finger pain.  She had a fall on 7/15.  She was seen in the emergency department at that time.  Continues to have pain with finger flexion.  Review of the emergency department note from 7/16 shows she was provided in a volar splint. Independent review of the right hand x-ray from 7/16 shows nondisplaced fracture at the base of the middle phalanx of the index finger.   Review of Systems See HPI   HISTORY: Past Medical, Surgical, Social, and Family History Reviewed & Updated per EMR.   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   ANKLE PAIN, LEFT 06/26/2008   BACK PAIN, LUMBAR 10/15/2007   BIPOLAR DISORDER UNSPECIFIED 09/13/2007   CERVICAL RADICULOPATHY, LEFT 06/26/2008   Cervicalgia 10/15/2007   Chronic pain syndrome 03/21/2011   COMMON MIGRAINE 06/26/2008   DIABETES MELLITUS, TYPE II 06/05/2007   DYSPNEA 12/05/2007   EAR PAIN, RIGHT 12/05/2007   ELEVATED BP READING WITHOUT DX HYPERTENSION 07/29/2009   FATIGUE 12/05/2007   FIBROMYALGIA 06/26/2008   GASTROENTERITIS, ACUTE 12/15/2008   GERD 06/26/2008   Headache(784.0) 09/13/2007   HYPERLIPIDEMIA 09/13/2007   JOINT EFFUSION, RIGHT KNEE 07/29/2009   KNEE PAIN, LEFT 11/21/2010   LUMBAR RADICULOPATHY, LEFT 06/26/2008   OTHER DISEASE OF PHARYNX OR NASOPHARYNX 07/03/2008   PERIPHERAL EDEMA 01/06/2009   POLYARTHRITIS 11/21/2010   SHOULDER PAIN, BILATERAL 07/03/2008   SINUSITIS- ACUTE-NOS 07/29/2009   TACHYCARDIA 12/05/2007   THYROID NODULE, RIGHT 11/20/2008   TREMOR 01/02/2008   Vertigo     Past Surgical History:  Procedure Laterality Date   back surgury     lumbar  2001   CESAREAN SECTION     x 3   thyroid fine needle aspiration  May 2008   showed non neoplastic goiter   VAGINAL HYSTERECTOMY     fibroids     PHYSICAL EXAM:  VS: BP 120/80 (BP Location: Left Arm, Patient Position: Sitting)   Ht '5\' 4"'$  (1.626 m)   Wt 190 lb (86.2 kg)   BMI 32.61 kg/m  Physical Exam Gen: NAD, alert, cooperative with exam, well-appearing MSK: Neurovascularly intact       ASSESSMENT & PLAN:   Nondisplaced fracture of middle phalanx of right index finger, initial encounter for closed fracture Acutely occurring.  Initial injury on 7/15.  Had a fall with subsequent fracture appreciated.  No malrotation or misalignment.  Limited flexion today. -Counseled on home exercise therapy and supportive care. -Counseled on buddy taping. -X-ray. -Follow-up in 3 weeks.

## 2022-05-29 ENCOUNTER — Telehealth: Payer: Self-pay | Admitting: Family Medicine

## 2022-05-29 MED ORDER — CYCLOBENZAPRINE HCL 5 MG PO TABS
5.0000 mg | ORAL_TABLET | Freq: Three times a day (TID) | ORAL | 1 refills | Status: DC | PRN
Start: 2022-05-29 — End: 2024-07-24

## 2022-05-29 NOTE — Telephone Encounter (Signed)
Informed of results. Counseled on buddy taping. Refilled flexeril.   Rosemarie Ax, MD [facility] 05/29/2022, 11:06 AM

## 2022-06-12 ENCOUNTER — Ambulatory Visit: Payer: Medicaid Other | Admitting: Family Medicine

## 2022-07-06 ENCOUNTER — Ambulatory Visit: Payer: Self-pay | Admitting: Cardiology

## 2022-08-01 ENCOUNTER — Ambulatory Visit: Payer: Medicaid Other | Attending: Cardiology | Admitting: Cardiology

## 2022-08-01 ENCOUNTER — Ambulatory Visit: Payer: Medicaid Other | Admitting: Family Medicine

## 2022-08-01 ENCOUNTER — Encounter: Payer: Self-pay | Admitting: Family Medicine

## 2022-08-01 ENCOUNTER — Encounter: Payer: Self-pay | Admitting: Cardiology

## 2022-08-01 VITALS — Ht 64.0 in

## 2022-08-01 VITALS — BP 150/86 | HR 106 | Ht 64.0 in | Wt 193.1 lb

## 2022-08-01 DIAGNOSIS — I152 Hypertension secondary to endocrine disorders: Secondary | ICD-10-CM | POA: Insufficient documentation

## 2022-08-01 DIAGNOSIS — I10 Essential (primary) hypertension: Secondary | ICD-10-CM | POA: Diagnosis not present

## 2022-08-01 DIAGNOSIS — E119 Type 2 diabetes mellitus without complications: Secondary | ICD-10-CM | POA: Diagnosis not present

## 2022-08-01 DIAGNOSIS — E782 Mixed hyperlipidemia: Secondary | ICD-10-CM

## 2022-08-01 DIAGNOSIS — B349 Viral infection, unspecified: Secondary | ICD-10-CM | POA: Diagnosis not present

## 2022-08-01 NOTE — Progress Notes (Signed)
Cardiology Office Note:    Date:  08/01/2022   ID:  Shirley [name], DOB 1976/08/26, MRN [id number]  PCP:  Mackie Pai, PA-C  Cardiologist:  Jenean Lindau, MD   Referring MD: Mackie Pai, PA-C    ASSESSMENT:    1. Type 2 diabetes mellitus without complication, without long-term current use of insulin ([place])   2. Essential hypertension   3. Mixed dyslipidemia    PLAN:    In order of problems listed above:  Primary prevention stressed with the patient.  Importance of compliance with diet medication stressed and she vocalized understanding. Essential hypertension: Blood pressure is stable.  Diet was emphasized.  Lifestyle modification urged.  Patient has upper respiratory and sinus issues so she is taking medications over-the-counter.  I told her to keep a track of her pulse and blood pressure over the next 2 weeks and send to [name] for review. Uncontrolled diabetes mellitus: I cautioned her about this.  Her last hemoglobin A1c was significant elevated and she understands.  She is going to follow-up with her primary care for this. Mixed dyslipidemia: On lipid-lowering medications followed by primary care. Obesity: Weight reduction stressed and she promises to do better.  Risks of obesity explained. Patient will be seen in follow-up appointment in 6 months or earlier if the patient has any concerns    Medication Adjustments/Labs and Tests Ordered: Current medicines are reviewed at length with the patient today.  Concerns regarding medicines are outlined above.  No orders of the defined types were placed in this encounter.  No orders of the defined types were placed in this encounter.    No chief complaint on file.    History of Present Illness:    Shirley [name] is a 46 y.o. female.  Patient has past medical history of essential hypertension, dyslipidemia and uncontrolled diabetes mellitus.  She overall leads a sedentary lifestyle.  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  She does not exercise on a regular basis.  At the time of my evaluation, the patient is alert awake oriented and in no distress.  Past Medical History:  Diagnosis Date   Achilles tendinitis of right lower extremity 04/07/2021   ADHD (attention deficit hyperactivity disorder)    ANEMIA-IRON DEFICIENCY 06/26/2008   Ankle impingement syndrome, right 03/01/2021   BIPOLAR DISORDER UNSPECIFIED 09/13/2007   Cervicalgia 10/15/2007   Chronic pain syndrome 03/21/2011   Contracture of right Achilles tendon 03/28/2021   COVID-19 virus infection 11/24/2020   DYSPNEA 12/05/2007   ELEVATED BP READING WITHOUT DX HYPERTENSION 07/29/2009   Encounter for long-term (current) use of high-risk medication 05/29/2011   Fibromyalgia 06/26/2008   Qualifier: Diagnosis of  By: Jenny Reichmann MD, Hunt Oris    GERD 06/26/2008   Hyperlipidemia 09/13/2007   Qualifier: Diagnosis of  By: Yoo DO, [place], RIGHT KNEE 07/29/2009   Low back pain radiating to right leg 12/01/2015   Lumbar radiculopathy 08/30/2021   Metatarsalgia of right foot 03/01/2021   MRSA (methicillin resistant staph aureus) urine culture positive on 07/06/16 07/06/2016   Treated with doxycycline   Neck pain 10/15/2007   Centricity Description: NECK PAIN Qualifier: Diagnosis of  By: Wynona Luna  Centricity Description: CERVICALGIA Qualifier: Diagnosis of  By: Jenny Reichmann MD, Hunt Oris    Nondisplaced fracture of middle phalanx of right index finger, initial encounter for closed fracture 05/25/2022   Patellar tendinitis of left knee 08/30/2021  Plantar fasciitis of right foot 03/17/2021   Prepatellar bursitis of left knee 08/30/2021   Shoulder impingement syndrome, left 11/07/2013   SINUSITIS- ACUTE-NOS 07/29/2009   Thoracic back pain 12/20/2015   THYROID NODULE, RIGHT 11/20/2008   Toe injury, left, initial encounter 05/22/2019   Type 2 diabetes mellitus without complication, without long-term current use of insulin  ([place]) 06/05/2007   Qualifier: Diagnosis of  By: Garen Grams      Past Surgical History:  Procedure Laterality Date   back surgury     lumbar 2001   CESAREAN SECTION     x 3   thyroid fine needle aspiration  May 2008   showed non neoplastic goiter   VAGINAL HYSTERECTOMY     fibroids    Current Medications: Current Meds  Medication Sig   busPIRone (BUSPAR) 7.5 MG tablet Take 1 tablet (7.5 mg total) by mouth 2 (two) times daily.   carvedilol (COREG) 12.5 MG tablet Take 12.5 mg by mouth 2 (two) times daily with a meal.   conjugated estrogens (PREMARIN) vaginal cream Place 1 Applicatorful vaginally daily.   cyclobenzaprine (FLEXERIL) 5 MG tablet Take 1 tablet (5 mg total) by mouth 3 (three) times daily as needed for muscle spasms. 1 tab po q hs prn tension ha   diclofenac Sodium (VOLTAREN) 1 % GEL Apply 4 g topically 4 (four) times daily. To affected joint.   diltiazem (CARDIZEM) 30 MG tablet Take 30 mg by mouth 2 (two) times daily.   famciclovir (FAMVIR) 500 MG tablet Take 1 tablet (500 mg total) by mouth 3 (three) times daily.   famotidine (PEPCID) 20 MG tablet TAKE 1 TABLET BY MOUTH 2 TIMES DAILY   fluconazole (DIFLUCAN) 150 MG tablet Take 1 tablet (150 mg total) by mouth every 3 (three) days. For three doses   fluticasone (FLONASE) 50 MCG/ACT nasal spray Place 2 sprays into both nostrils as needed for allergies or rhinitis.   hydrOXYzine (ATARAX) 10 MG tablet Take 1 tablet (10 mg total) by mouth 3 (three) times daily as needed for itching.   meloxicam (MOBIC) 15 MG tablet TAKE ONE TABLET BY MOUTH EVERY MORNING WITH BREAKFAST FOR 2 WEEKS , THEN DAILY AS NEEDED FOR PAIN   nitroGLYCERIN (NITRODUR - DOSED IN MG/24 HR) 0.2 mg/hr patch Cut and apply 1/4 patch to most painful area q24h.   progesterone (PROMETRIUM) 200 MG capsule Take 1 capsule (200 mg total) by mouth daily.   rosuvastatin (CRESTOR) 20 MG tablet Take 20 mg by mouth daily.   sitaGLIPtin (JANUVIA) 100 MG tablet Take 1  tablet (100 mg total) by mouth daily.     Allergies:   Sulfa antibiotics, Contrast media [iodinated contrast media], Gadolinium derivatives, Promethazine hcl, and Reglan [metoclopramide]   Social History   Socioeconomic History   Marital status: Significant Other    Spouse name: Not on file   Number of children: 3   Years of education: Not on file   Highest education level: Not on file  Occupational History    Employer: UNEMPLOYED  Tobacco Use   Smoking status: Never   Smokeless tobacco: Never  Vaping Use   Vaping Use: Never used  Substance and Sexual Activity   Alcohol use: No   Drug use: No   Sexual activity: Not on file  Other Topics Concern   Not on file  Social History Narrative   Not on file   Social Determinants of Health   Financial Resource Strain: Not on file  Food Insecurity:  Not on file  Transportation Needs: Not on file  Physical Activity: Not on file  Stress: Not on file  Social Connections: Not on file     Family History: The patient's family history includes Asthma in her mother; Cancer in some other family members; Coronary artery disease in an other family member; Diabetes in her father and mother; Emphysema in an other family member; Hyperlipidemia in her father; Hypertension in her father; Thyroid disease in her mother. There is no history of Breast cancer.  ROS:   Please see the history of present illness.    All other systems reviewed and are negative.  EKGs/Labs/Other Studies Reviewed:    The following studies were reviewed today: I discussed my findings with the patient at length.  EKG revealed sinus rhythm and nonspecific ST-T changes   Recent Labs: No results found for requested labs within last 365 days.  Recent Lipid Panel    Component Value Date/Time   CHOL 268 (H) 10/04/2020 1616   TRIG 152 (H) 10/04/2020 1616   HDL 57 10/04/2020 1616   CHOLHDL 4.7 (H) 10/04/2020 1616   CHOLHDL 5 05/26/2011 1238   VLDL 22.8 05/26/2011 1238    LDLCALC 183 (H) 10/04/2020 1616   LDLDIRECT 165.1 05/26/2011 1238    Physical Exam:    VS:  BP (!) 150/86   Pulse (!) 106   Ht '5\' 4"'$  (1.626 m)   Wt 193 lb 1.9 oz (87.6 kg)   SpO2 99%   BMI 33.15 kg/m     Wt Readings from Last 3 Encounters:  08/01/22 193 lb 1.9 oz (87.6 kg)  05/25/22 190 lb (86.2 kg)  05/07/22 190 lb (86.2 kg)     GEN: Patient is in no acute distress HEENT: Normal NECK: No JVD; No carotid bruits LYMPHATICS: No lymphadenopathy CARDIAC: Hear sounds regular, 2/6 systolic murmur at the apex. RESPIRATORY:  Clear to auscultation without rales, wheezing or rhonchi  ABDOMEN: Soft, non-tender, non-distended MUSCULOSKELETAL:  No edema; No deformity  SKIN: Warm and dry NEUROLOGIC:  Alert and oriented x 3 PSYCHIATRIC:  Normal affect   Signed, Jenean Lindau, MD  08/01/2022 10:01 AM    [place]

## 2022-08-01 NOTE — Patient Instructions (Signed)

## 2022-08-01 NOTE — Progress Notes (Signed)
Established Patient Office Visit  Subjective   Patient ID: Shirley [name], female    DOB: 08/12/1976  Age: 46 y.o. MRN: [id number]  Chief Complaint  Patient presents with   Sinusitis    Sinus pressure headaches covid neg symptoms x 10 days.    Sinusitis Associated symptoms include congestion. Pertinent negatives include no chills, coughing, headaches, shortness of breath or sore throat.   3 to 4-day history of nasal congestion, sinus pressure, scant postnasal drip.  She feels the pressure in the bridge of her nose and across her cheekbones.  There is no teeth pain, fevers chills, cough or wheezing.  There is scant yellowish rhinorrhea.  She has tried Aleve D with some relief.  She has Flonase and Mucinex DM at the house.  Home COVID test was negative.  She has no asthma history.  She does not smoke.    Review of Systems  Constitutional: Negative.  Negative for chills, fever and malaise/fatigue.  HENT:  Positive for congestion and sinus pain. Negative for nosebleeds and sore throat.   Eyes:  Negative for blurred vision, discharge and redness.  Respiratory: Negative.  Negative for cough, sputum production, shortness of breath, wheezing and stridor.   Cardiovascular: Negative.   Gastrointestinal:  Negative for abdominal pain.  Genitourinary: Negative.   Musculoskeletal: Negative.  Negative for joint pain and myalgias.  Skin:  Negative for rash.  Neurological:  Negative for tingling, loss of consciousness, weakness and headaches.  Endo/Heme/Allergies:  Negative for polydipsia.      Objective:     Ht '5\' 4"'$  (1.626 m)   BMI 33.15 kg/m      Physical Exam Constitutional:      General: She is not in acute distress.    Appearance: Normal appearance. She is not ill-appearing, toxic-appearing or diaphoretic.  HENT:     Head: Normocephalic and atraumatic.     Right Ear: External ear normal.     Left Ear: External ear normal.  Eyes:     General: No scleral icterus.        Right eye: No discharge.        Left eye: No discharge.     Extraocular Movements: Extraocular movements intact.     Conjunctiva/sclera: Conjunctivae normal.  Pulmonary:     Effort: Pulmonary effort is normal. No respiratory distress.  Abdominal:     Tenderness: There is no guarding.  Skin:    General: Skin is warm and dry.  Neurological:     Mental Status: She is alert and oriented to person, place, and time.  Psychiatric:        Mood and Affect: Mood normal.        Behavior: Behavior normal.      No results found for any visits on 08/01/22.    The 10-year ASCVD risk score (Arnett DK, et al., 2019) is: 16.4%    Assessment & Plan:   Problem List Items Addressed This Visit   None Visit Diagnoses     Viral syndrome    -  Primary       No follow-ups on file.  May continue Aleve with decongestant as needed.  Please start Flonase and Mucinex.  Hydrate well.  Follow-up first of next week if not improving.  Libby Maw, MD  Virtual Visit via Video Note  I connected with Shirley [name] on 08/01/22 at  2:20 PM EDT by a video enabled telemedicine application and verified that I am speaking with the correct  person using two identifiers.  Location: Patient: home alone in a room.  Provider: work   I discussed the limitations of evaluation and management by telemedicine and the availability of in person appointments. The patient expressed understanding and agreed to proceed.  History of Present Illness:    Observations/Objective:   Assessment and Plan:   Follow Up Instructions:    I discussed the assessment and treatment plan with the patient. The patient was provided an opportunity to ask questions and all were answered. The patient agreed with the plan and demonstrated an understanding of the instructions.   The patient was advised to call back or seek an in-person evaluation if the symptoms worsen or if the condition fails to improve as  anticipated.  I provided 20 minutes of non-face-to-face time during this encounter.   Libby Maw, MD

## 2022-12-22 ENCOUNTER — Ambulatory Visit (INDEPENDENT_AMBULATORY_CARE_PROVIDER_SITE_OTHER): Payer: Medicaid Other | Admitting: Family Medicine

## 2022-12-22 ENCOUNTER — Encounter: Payer: Self-pay | Admitting: Family Medicine

## 2022-12-22 VITALS — BP 130/94 | Ht 64.0 in | Wt 193.0 lb

## 2022-12-22 DIAGNOSIS — M778 Other enthesopathies, not elsewhere classified: Secondary | ICD-10-CM | POA: Diagnosis not present

## 2022-12-22 NOTE — Patient Instructions (Signed)
Good to see you Please try heat  Please try the exercises  Please let me know if you would like to try physical therapy   Please send me a message in MyChart with any questions or updates.  Please see me back in 6-8 weeks.   --Dr. Raeford Razor

## 2022-12-22 NOTE — Assessment & Plan Note (Signed)
Acutely occurring.  Occurred after her fracture of the middle phalanx last summer.  Now having limited flexion of the PIP joint the right index finger. -Counseled on home exercise therapy and supportive care. -Could not set her injection or physical therapy.

## 2022-12-22 NOTE — Progress Notes (Signed)
  Shirley [name] - 47 y.o. female MRN [id number]  Date of birth: Dec 25, 1975  SUBJECTIVE:  Including CC & ROS.  No chief complaint on file.   Shirley [name] is a 47 y.o. female that is following up for her right finger pain.  She is having limited range of motion of the PIP joint of the right index finger.  She had a fracture last summer in the same area.    Review of Systems See HPI   HISTORY: Past Medical, Surgical, Social, and Family History Reviewed & Updated per EMR.   Pertinent Historical Findings include:  Past Medical History:  Diagnosis Date   Achilles tendinitis of right lower extremity 04/07/2021   ADHD (attention deficit hyperactivity disorder)    ANEMIA-IRON DEFICIENCY 06/26/2008   Ankle impingement syndrome, right 03/01/2021   BIPOLAR DISORDER UNSPECIFIED 09/13/2007   Cervicalgia 10/15/2007   Chronic pain syndrome 03/21/2011   Contracture of right Achilles tendon 03/28/2021   COVID-19 virus infection 11/24/2020   DYSPNEA 12/05/2007   ELEVATED BP READING WITHOUT DX HYPERTENSION 07/29/2009   Encounter for long-term (current) use of high-risk medication 05/29/2011   Fibromyalgia 06/26/2008   Qualifier: Diagnosis of  By: Jenny Reichmann MD, Hunt Oris    GERD 06/26/2008   Hyperlipidemia 09/13/2007   Qualifier: Diagnosis of  By: Yoo DO, [place], RIGHT KNEE 07/29/2009   Low back pain radiating to right leg 12/01/2015   Lumbar radiculopathy 08/30/2021   Metatarsalgia of right foot 03/01/2021   MRSA (methicillin resistant staph aureus) urine culture positive on 07/06/16 07/06/2016   Treated with doxycycline   Neck pain 10/15/2007   Centricity Description: NECK PAIN Qualifier: Diagnosis of  By: Wynona Luna  Centricity Description: CERVICALGIA Qualifier: Diagnosis of  By: Jenny Reichmann MD, Hunt Oris    Nondisplaced fracture of middle phalanx of right index finger, initial encounter for closed fracture 05/25/2022   Patellar tendinitis of left knee 08/30/2021   Plantar fasciitis of  right foot 03/17/2021   Prepatellar bursitis of left knee 08/30/2021   Shoulder impingement syndrome, left 11/07/2013   SINUSITIS- ACUTE-NOS 07/29/2009   Thoracic back pain 12/20/2015   THYROID NODULE, RIGHT 11/20/2008   Toe injury, left, initial encounter 05/22/2019   Type 2 diabetes mellitus without complication, without long-term current use of insulin ([place]) 06/05/2007   Qualifier: Diagnosis of  By: Garen Grams      Past Surgical History:  Procedure Laterality Date   back surgury     lumbar 2001   CESAREAN SECTION     x 3   thyroid fine needle aspiration  May 2008   showed non neoplastic goiter   VAGINAL HYSTERECTOMY     fibroids     PHYSICAL EXAM:  VS: BP (!) 130/94 (BP Location: Left Arm, Patient Position: Sitting)   Ht '5\' 4"'$  (1.626 m)   Wt 193 lb (87.5 kg)   BMI 33.13 kg/m  Physical Exam Gen: NAD, alert, cooperative with exam, well-appearing MSK:  Neurovascularly intact       ASSESSMENT & PLAN:   Adhesive capsulitis of finger Acutely occurring.  Occurred after her fracture of the middle phalanx last summer.  Now having limited flexion of the PIP joint the right index finger. -Counseled on home exercise therapy and supportive care. -Could not set her injection or physical therapy.

## 2023-01-02 ENCOUNTER — Encounter: Payer: Self-pay | Admitting: General Practice

## 2023-02-05 ENCOUNTER — Encounter: Payer: Self-pay | Admitting: *Deleted

## 2023-02-15 ENCOUNTER — Encounter: Payer: Self-pay | Admitting: Family

## 2023-02-15 ENCOUNTER — Ambulatory Visit: Payer: Medicaid Other | Admitting: Family

## 2023-02-15 VITALS — BP 138/84 | HR 110 | Resp 18 | Ht 64.0 in | Wt 194.2 lb

## 2023-02-15 DIAGNOSIS — E119 Type 2 diabetes mellitus without complications: Secondary | ICD-10-CM

## 2023-02-15 DIAGNOSIS — J019 Acute sinusitis, unspecified: Secondary | ICD-10-CM

## 2023-02-15 DIAGNOSIS — R000 Tachycardia, unspecified: Secondary | ICD-10-CM

## 2023-02-15 MED ORDER — FLUCONAZOLE 150 MG PO TABS: ORAL_TABLET | ORAL | 0 refills | Status: DC

## 2023-02-15 MED ORDER — DOXYCYCLINE HYCLATE 100 MG PO TABS: 100.0000 mg | ORAL_TABLET | Freq: Two times a day (BID) | ORAL | 0 refills | Status: DC

## 2023-02-15 NOTE — Progress Notes (Signed)
Shirley [name] is a 47 y.o. female with the following history as recorded in Epic[employer]:  Patient Active Problem List   Diagnosis Date Noted   Adhesive capsulitis of finger 12/22/2022   Essential hypertension 08/01/2022   Mixed dyslipidemia 08/01/2022   Nondisplaced fracture of middle phalanx of right index finger, initial encounter for closed fracture 05/25/2022   Patellar tendinitis of left knee 08/30/2021   Prepatellar bursitis of left knee 08/30/2021   Lumbar radiculopathy 08/30/2021   Achilles tendinitis of right lower extremity 04/07/2021   Contracture of right Achilles tendon 03/28/2021   Plantar fasciitis of right foot 03/17/2021   Ankle impingement syndrome, right 03/01/2021   Metatarsalgia of right foot 03/01/2021   COVID-19 virus infection 11/24/2020   ADHD (attention deficit hyperactivity disorder)    Toe injury, left, initial encounter 05/22/2019   MRSA (methicillin resistant staph aureus) urine culture positive on 07/06/16 07/06/2016   Thoracic back pain 12/20/2015   Low back pain radiating to right leg 12/01/2015   Shoulder impingement syndrome, left 11/07/2013   Encounter for long-term (current) use of high-risk medication 05/29/2011   Chronic pain syndrome 03/21/2011   SINUSITIS- ACUTE-NOS 07/29/2009   JOINT EFFUSION, RIGHT KNEE 07/29/2009   ELEVATED BP READING WITHOUT DX HYPERTENSION 07/29/2009   THYROID NODULE, RIGHT 11/20/2008   GERD 06/26/2008   Fibromyalgia 06/26/2008   ANEMIA-IRON DEFICIENCY 06/26/2008   DYSPNEA 12/05/2007   Neck pain 10/15/2007   Cervicalgia 10/15/2007   Hyperlipidemia 09/13/2007   BIPOLAR DISORDER UNSPECIFIED 09/13/2007   Type 2 diabetes mellitus without complication, without long-term current use of insulin 06/05/2007    Current Outpatient Medications  Medication Sig Dispense Refill   busPIRone (BUSPAR) 7.5 MG tablet Take 1 tablet (7.5 mg total) by mouth 2 (two) times daily. 60 tablet 0   carvedilol (COREG) 12.5 MG tablet Take 12.5  mg by mouth 2 (two) times daily with a meal.     conjugated estrogens (PREMARIN) vaginal cream Place 1 Applicatorful vaginally daily. 42.5 g 12   cyclobenzaprine (FLEXERIL) 5 MG tablet Take 1 tablet (5 mg total) by mouth 3 (three) times daily as needed for muscle spasms. 1 tab po q hs prn tension ha 45 tablet 1   diclofenac Sodium (VOLTAREN) 1 % GEL Apply 4 g topically 4 (four) times daily. To affected joint. 100 g 11   diltiazem (CARDIZEM) 30 MG tablet Take 30 mg by mouth 2 (two) times daily.     doxycycline (VIBRA-TABS) 100 MG tablet Take 1 tablet (100 mg total) by mouth 2 (two) times daily. 14 tablet 0   famciclovir (FAMVIR) 500 MG tablet Take 1 tablet (500 mg total) by mouth 3 (three) times daily. 21 tablet 0   famotidine (PEPCID) 20 MG tablet TAKE 1 TABLET BY MOUTH 2 TIMES DAILY 60 tablet 0   fluconazole (DIFLUCAN) 150 MG tablet Take 1 tablet at onset of symptoms; repeat after 72 hours 2 tablet 0   fluticasone (FLONASE) 50 MCG/ACT nasal spray Place 2 sprays into both nostrils as needed for allergies or rhinitis.     hydrOXYzine (ATARAX) 10 MG tablet Take 1 tablet (10 mg total) by mouth 3 (three) times daily as needed for itching. 30 tablet 0   meloxicam (MOBIC) 15 MG tablet TAKE ONE TABLET BY MOUTH EVERY MORNING WITH BREAKFAST FOR 2 WEEKS , THEN DAILY AS NEEDED FOR PAIN 30 tablet 3   nitroGLYCERIN (NITRODUR - DOSED IN MG/24 HR) 0.2 mg/hr patch Cut and apply 1/4 patch to most painful area q24h.  30 patch 11   prednisoLONE acetate (PRED FORTE) 1 % ophthalmic suspension SMARTSIG:In Eye(s)     progesterone (PROMETRIUM) 200 MG capsule Take 1 capsule (200 mg total) by mouth daily. 90 capsule 3   rosuvastatin (CRESTOR) 20 MG tablet Take 20 mg by mouth daily.     sitaGLIPtin (JANUVIA) 100 MG tablet Take 1 tablet (100 mg total) by mouth daily. (Patient not taking: Reported on 02/15/2023) 30 tablet 3   No current facility-administered medications for this visit.    Allergies: Sulfa antibiotics, Contrast  media [iodinated contrast media], Gadolinium derivatives, Promethazine hcl, and Reglan [metoclopramide]  Past Medical History:  Diagnosis Date   Achilles tendinitis of right lower extremity 04/07/2021   ADHD (attention deficit hyperactivity disorder)    ANEMIA-IRON DEFICIENCY 06/26/2008   Ankle impingement syndrome, right 03/01/2021   BIPOLAR DISORDER UNSPECIFIED 09/13/2007   Cervicalgia 10/15/2007   Chronic pain syndrome 03/21/2011   Contracture of right Achilles tendon 03/28/2021   COVID-19 virus infection 11/24/2020   DYSPNEA 12/05/2007   ELEVATED BP READING WITHOUT DX HYPERTENSION 07/29/2009   Encounter for long-term (current) use of high-risk medication 05/29/2011   Fibromyalgia 06/26/2008   Qualifier: Diagnosis of  By: Jonny Ruiz MD, Len Blalock    GERD 06/26/2008   Hyperlipidemia 09/13/2007   Qualifier: Diagnosis of  By: Nena Jordan    JOINT EFFUSION, RIGHT KNEE 07/29/2009   Low back pain radiating to right leg 12/01/2015   Lumbar radiculopathy 08/30/2021   Metatarsalgia of right foot 03/01/2021   MRSA (methicillin resistant staph aureus) urine culture positive on 07/06/16 07/06/2016   Treated with doxycycline   Neck pain 10/15/2007   Centricity Description: NECK PAIN Qualifier: Diagnosis of  By: Nena Jordan  Centricity Description: CERVICALGIA Qualifier: Diagnosis of  By: Jonny Ruiz MD, Len Blalock    Nondisplaced fracture of middle phalanx of right index finger, initial encounter for closed fracture 05/25/2022   Patellar tendinitis of left knee 08/30/2021   Plantar fasciitis of right foot 03/17/2021   Prepatellar bursitis of left knee 08/30/2021   Shoulder impingement syndrome, left 11/07/2013   SINUSITIS- ACUTE-NOS 07/29/2009   Thoracic back pain 12/20/2015   THYROID NODULE, RIGHT 11/20/2008   Toe injury, left, initial encounter 05/22/2019   Type 2 diabetes mellitus without complication, without long-term current use of insulin 06/05/2007   Qualifier: Diagnosis of  By: Cheri Guppy      Past  Surgical History:  Procedure Laterality Date   back surgury     lumbar 2001   CESAREAN SECTION     x 3   thyroid fine needle aspiration  May 2008   showed non neoplastic goiter   VAGINAL HYSTERECTOMY     fibroids    Family History  Problem Relation Age of Onset   Thyroid disease Mother    Diabetes Mother    Asthma Mother    Hypertension Father    Hyperlipidemia Father    Diabetes Father    Emphysema Other    Coronary artery disease Other    Cancer Other        pancreatic and breast cancer   Cancer Other        lung and prostate cancer   Breast cancer Neg Hx     Social History   Tobacco Use   Smoking status: Never   Smokeless tobacco: Never  Substance Use Topics   Alcohol use: No    Subjective:   Sinus infection x 1 month; has been using Aleve D  with some benefit; developed infection/ inflammation of right eye in right eye and went to ophthalmologist last week who also suspected sinus infection at that time;   Objective:  Vitals:   02/15/23 1300  BP: 138/84  Pulse: (!) 110  Resp: 18  SpO2: 99%  Weight: 194 lb 3.2 oz (88.1 kg)  Height: 5\' 4"  (1.626 m)    General: Well developed, well nourished, in no acute distress  Skin : Warm and dry.  Head: Normocephalic and atraumatic  Eyes: Sclera and conjunctiva clear; pupils round and reactive to light; extraocular movements intact  Ears: External normal; canals clear; tympanic membranes normal  Oropharynx: Pink, supple. No suspicious lesions  Neck: Supple without thyromegaly, adenopathy  Lungs: Respirations unlabored; clear to auscultation bilaterally without wheeze, rales, rhonchi  CVS exam: normal rate and regular rhythm.  Neurologic: Alert and oriented; speech intact; face symmetrical; moves all extremities well; CNII-XII intact without focal deficit   Assessment:  1. Acute sinusitis, recurrence not specified, unspecified location   2. Tachycardia   3. Type 2 diabetes mellitus without complication, without  long-term current use of insulin     Plan:  Rx for Doxycycline 100 mg bid x 7 days; continue Flonase; Due for 6 month follow up with her cardiologist- referral updated; Overdue to see her PCP- not currently on medication; encouraged to schedule follow up; has been working on lifestyle changes.   Return for CPE, follow up with your PCP, Esperanza Richters.  Orders Placed This Encounter  Procedures   Ambulatory referral to Cardiology    Referral Priority:   Routine    Referral Type:   Consultation    Referral Reason:   Specialty Services Required    Number of Visits Requested:   1    Requested Prescriptions   Signed Prescriptions Disp Refills   doxycycline (VIBRA-TABS) 100 MG tablet 14 tablet 0    Sig: Take 1 tablet (100 mg total) by mouth 2 (two) times daily.   fluconazole (DIFLUCAN) 150 MG tablet 2 tablet 0    Sig: Take 1 tablet at onset of symptoms; repeat after 72 hours

## 2023-03-13 ENCOUNTER — Encounter: Payer: Self-pay | Admitting: Family

## 2023-03-13 ENCOUNTER — Ambulatory Visit: Payer: Medicaid Other | Admitting: Family

## 2023-03-13 VITALS — BP 136/84 | HR 88 | Ht 64.0 in | Wt 192.8 lb

## 2023-03-13 DIAGNOSIS — L309 Dermatitis, unspecified: Secondary | ICD-10-CM

## 2023-03-13 DIAGNOSIS — B009 Herpesviral infection, unspecified: Secondary | ICD-10-CM | POA: Diagnosis not present

## 2023-03-13 MED ORDER — VALACYCLOVIR HCL 1 G PO TABS: 1000.0000 mg | ORAL_TABLET | Freq: Two times a day (BID) | ORAL | 0 refills | Status: DC

## 2023-03-13 MED ORDER — VALACYCLOVIR HCL 500 MG PO TABS: 500.0000 mg | ORAL_TABLET | Freq: Two times a day (BID) | ORAL | 1 refills | Status: DC

## 2023-03-13 NOTE — Progress Notes (Signed)
Shirley [name] is a 47 y.o. female with the following history as recorded in EpicCare:  Patient Active Problem List   Diagnosis Date Noted   Adhesive capsulitis of finger 12/22/2022   Essential hypertension 08/01/2022   Mixed dyslipidemia 08/01/2022   Nondisplaced fracture of middle phalanx of right index finger, initial encounter for closed fracture 05/25/2022   Patellar tendinitis of left knee 08/30/2021   Prepatellar bursitis of left knee 08/30/2021   Lumbar radiculopathy 08/30/2021   Achilles tendinitis of right lower extremity 04/07/2021   Contracture of right Achilles tendon 03/28/2021   Plantar fasciitis of right foot 03/17/2021   Ankle impingement syndrome, right 03/01/2021   Metatarsalgia of right foot 03/01/2021   COVID-19 virus infection 11/24/2020   ADHD (attention deficit hyperactivity disorder)    Toe injury, left, initial encounter 05/22/2019   MRSA (methicillin resistant staph aureus) urine culture positive on 07/06/16 07/06/2016   Thoracic back pain 12/20/2015   Low back pain radiating to right leg 12/01/2015   Shoulder impingement syndrome, left 11/07/2013   Encounter for long-term (current) use of high-risk medication 05/29/2011   Chronic pain syndrome 03/21/2011   SINUSITIS- ACUTE-NOS 07/29/2009   JOINT EFFUSION, RIGHT KNEE 07/29/2009   ELEVATED BP READING WITHOUT DX HYPERTENSION 07/29/2009   THYROID NODULE, RIGHT 11/20/2008   GERD 06/26/2008   Fibromyalgia 06/26/2008   ANEMIA-IRON DEFICIENCY 06/26/2008   DYSPNEA 12/05/2007   Neck pain 10/15/2007   Cervicalgia 10/15/2007   Hyperlipidemia 09/13/2007   BIPOLAR DISORDER UNSPECIFIED 09/13/2007   Type 2 diabetes mellitus without complication, without long-term current use of insulin (HCC) 06/05/2007    Current Outpatient Medications  Medication Sig Dispense Refill   busPIRone (BUSPAR) 7.5 MG tablet Take 1 tablet (7.5 mg total) by mouth 2 (two) times daily. 60 tablet 0   carvedilol (COREG) 12.5 MG tablet  Take 12.5 mg by mouth 2 (two) times daily with a meal.     conjugated estrogens (PREMARIN) vaginal cream Place 1 Applicatorful vaginally daily. 42.5 g 12   cyclobenzaprine (FLEXERIL) 5 MG tablet Take 1 tablet (5 mg total) by mouth 3 (three) times daily as needed for muscle spasms. 1 tab po q hs prn tension ha 45 tablet 1   diclofenac Sodium (VOLTAREN) 1 % GEL Apply 4 g topically 4 (four) times daily. To affected joint. 100 g 11   diltiazem (CARDIZEM) 30 MG tablet Take 30 mg by mouth 2 (two) times daily.     famotidine (PEPCID) 20 MG tablet TAKE 1 TABLET BY MOUTH 2 TIMES DAILY 60 tablet 0   fluticasone (FLONASE) 50 MCG/ACT nasal spray Place 2 sprays into both nostrils as needed for allergies or rhinitis.     hydrOXYzine (ATARAX) 10 MG tablet Take 1 tablet (10 mg total) by mouth 3 (three) times daily as needed for itching. 30 tablet 0   meloxicam (MOBIC) 15 MG tablet TAKE ONE TABLET BY MOUTH EVERY MORNING WITH BREAKFAST FOR 2 WEEKS , THEN DAILY AS NEEDED FOR PAIN 30 tablet 3   nitroGLYCERIN (NITRODUR - DOSED IN MG/24 HR) 0.2 mg/hr patch Cut and apply 1/4 patch to most painful area q24h. 30 patch 11   prednisoLONE acetate (PRED FORTE) 1 % ophthalmic suspension SMARTSIG:In Eye(s)     progesterone (PROMETRIUM) 200 MG capsule Take 1 capsule (200 mg total) by mouth daily. 90 capsule 3   rosuvastatin (CRESTOR) 20 MG tablet Take 20 mg by mouth daily.     sitaGLIPtin (JANUVIA) 100 MG tablet Take 1 tablet (100 mg total)  by mouth daily. 30 tablet 3   valACYclovir (VALTREX) 1000 MG tablet Take 1 tablet (1,000 mg total) by mouth 2 (two) times daily. 20 tablet 0   valACYclovir (VALTREX) 500 MG tablet Take 1 tablet (500 mg total) by mouth 2 (two) times daily. 90 tablet 1   No current facility-administered medications for this visit.    Allergies: Sulfa antibiotics, Contrast media [iodinated contrast media], Gadolinium derivatives, Promethazine hcl, and Reglan [metoclopramide]  Past Medical History:  Diagnosis  Date   Achilles tendinitis of right lower extremity 04/07/2021   ADHD (attention deficit hyperactivity disorder)    ANEMIA-IRON DEFICIENCY 06/26/2008   Ankle impingement syndrome, right 03/01/2021   BIPOLAR DISORDER UNSPECIFIED 09/13/2007   Cervicalgia 10/15/2007   Chronic pain syndrome 03/21/2011   Contracture of right Achilles tendon 03/28/2021   COVID-19 virus infection 11/24/2020   DYSPNEA 12/05/2007   ELEVATED BP READING WITHOUT DX HYPERTENSION 07/29/2009   Encounter for long-term (current) use of high-risk medication 05/29/2011   Fibromyalgia 06/26/2008   Qualifier: Diagnosis of  By: Jonny Ruiz MD, Len Blalock    GERD 06/26/2008   Hyperlipidemia 09/13/2007   Qualifier: Diagnosis of  By: Nena Jordan    JOINT EFFUSION, RIGHT KNEE 07/29/2009   Low back pain radiating to right leg 12/01/2015   Lumbar radiculopathy 08/30/2021   Metatarsalgia of right foot 03/01/2021   MRSA (methicillin resistant staph aureus) urine culture positive on 07/06/16 07/06/2016   Treated with doxycycline   Neck pain 10/15/2007   Centricity Description: NECK PAIN Qualifier: Diagnosis of  By: Nena Jordan  Centricity Description: CERVICALGIA Qualifier: Diagnosis of  By: Jonny Ruiz MD, Len Blalock    Nondisplaced fracture of middle phalanx of right index finger, initial encounter for closed fracture 05/25/2022   Patellar tendinitis of left knee 08/30/2021   Plantar fasciitis of right foot 03/17/2021   Prepatellar bursitis of left knee 08/30/2021   Shoulder impingement syndrome, left 11/07/2013   SINUSITIS- ACUTE-NOS 07/29/2009   Thoracic back pain 12/20/2015   THYROID NODULE, RIGHT 11/20/2008   Toe injury, left, initial encounter 05/22/2019   Type 2 diabetes mellitus without complication, without long-term current use of insulin (HCC) 06/05/2007   Qualifier: Diagnosis of  By: Cheri Guppy      Past Surgical History:  Procedure Laterality Date   back surgury     lumbar 2001   CESAREAN SECTION     x 3   thyroid fine needle  aspiration  May 2008   showed non neoplastic goiter   VAGINAL HYSTERECTOMY     fibroids    Family History  Problem Relation Age of Onset   Thyroid disease Mother    Diabetes Mother    Asthma Mother    Hypertension Father    Hyperlipidemia Father    Diabetes Father    Emphysema Other    Coronary artery disease Other    Cancer Other        pancreatic and breast cancer   Cancer Other        lung and prostate cancer   Breast cancer Neg Hx     Social History   Tobacco Use   Smoking status: Never   Smokeless tobacco: Never  Substance Use Topics   Alcohol use: No    Subjective:   Concerned for recurrent rash- always located on forearm; per patient, symptoms present x 20 years; notes that symptoms always occur in the spring and will resolve by the fall; very sensitive to sun exposure; would  like to meet with dermatologist;  Also notes she has been struggling with outbreaks of cold sores on her lips for extended period of time as well; will be starting OTC Lysine; per patient, up to date with dental care and no concerns about infections;     Objective:  Vitals:   03/13/23 0957  BP: 136/84  Pulse: 88  SpO2: 99%  Weight: 192 lb 12.8 oz (87.5 kg)  Height: 5\' 4"  (1.626 m)    General: Well developed, well nourished, in no acute distress  Skin : Warm and dry. Vesicular lesions noted on bottom lip Head: Normocephalic and atraumatic  Lungs: Respirations unlabored;  Neurologic: Alert and oriented; speech intact; face symmetrical; moves all extremities well; CNII-XII intact without focal deficit   Assessment:  1. 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 Eczema type reaction; refer to dermatology per patient request; symptoms present x 20 years; Try treating for 10 days with 1 gm Valtrex bid and then go to Valtrex 500 mg qd for preventive management- will try for next 6 months;  She is again encouraged to see her PCP in follow up; message also sent  to her through MyChart asking her to schedule.   No follow-ups on file.  Orders Placed This Encounter  Procedures   Ambulatory referral to Dermatology    Referral Priority:   Routine    Referral Type:   Consultation    Referral Reason:   Specialty Services Required    Requested Specialty:   Dermatology    Number of Visits Requested:   1    Requested Prescriptions   Signed Prescriptions Disp Refills   valACYclovir (VALTREX) 1000 MG tablet 20 tablet 0    Sig: Take 1 tablet (1,000 mg total) by mouth 2 (two) times daily.   valACYclovir (VALTREX) 500 MG tablet 90 tablet 1    Sig: Take 1 tablet (500 mg total) by mouth 2 (two) times daily.

## 2023-03-16 ENCOUNTER — Encounter: Payer: Self-pay | Admitting: Family Medicine

## 2023-03-16 ENCOUNTER — Ambulatory Visit (INDEPENDENT_AMBULATORY_CARE_PROVIDER_SITE_OTHER): Payer: Medicaid Other | Admitting: Family Medicine

## 2023-03-16 VITALS — BP 122/80 | Ht 64.0 in | Wt 197.0 lb

## 2023-03-16 DIAGNOSIS — M899 Disorder of bone, unspecified: Secondary | ICD-10-CM | POA: Insufficient documentation

## 2023-03-16 NOTE — Assessment & Plan Note (Signed)
Acutely occurring with right-sided medial border scapular pain.  Having dysfunction causing the pain and less likely for cervical radicular type pain. -Counseled on home exercise therapy and supportive care. -Could consider trigger point injection or physical therapy

## 2023-03-16 NOTE — Patient Instructions (Signed)
Good to see you Please try the exercises  You can try salon pas  Please alternate heat and ice   Please send me a message in MyChart with any questions or updates.  Please see the clinic back as needed.   --Dr. Jordan Likes

## 2023-03-16 NOTE — Progress Notes (Signed)
  Shirley [name] - 47 y.o. female MRN [id number]  Date of birth: 02/22/1976  SUBJECTIVE:  Including CC & ROS.  No chief complaint on file.   ILYN [name] is a 47 y.o. female that is presenting with right periscapular pain.  Has been ongoing for the past few weeks.  Localized to the medial border of the scapula.   Review of Systems See HPI   HISTORY: Past Medical, Surgical, Social, and Family History Reviewed & Updated per EMR.   Pertinent Historical Findings include:  Past Medical History:  Diagnosis Date   Achilles tendinitis of right lower extremity 04/07/2021   ADHD (attention deficit hyperactivity disorder)    ANEMIA-IRON DEFICIENCY 06/26/2008   Ankle impingement syndrome, right 03/01/2021   BIPOLAR DISORDER UNSPECIFIED 09/13/2007   Cervicalgia 10/15/2007   Chronic pain syndrome 03/21/2011   Contracture of right Achilles tendon 03/28/2021   COVID-19 virus infection 11/24/2020   DYSPNEA 12/05/2007   ELEVATED BP READING WITHOUT DX HYPERTENSION 07/29/2009   Encounter for long-term (current) use of high-risk medication 05/29/2011   Fibromyalgia 06/26/2008   Qualifier: Diagnosis of  By: Jonny Ruiz MD, Len Blalock    GERD 06/26/2008   Hyperlipidemia 09/13/2007   Qualifier: Diagnosis of  By: Nena Jordan    JOINT EFFUSION, RIGHT KNEE 07/29/2009   Low back pain radiating to right leg 12/01/2015   Lumbar radiculopathy 08/30/2021   Metatarsalgia of right foot 03/01/2021   MRSA (methicillin resistant staph aureus) urine culture positive on 07/06/16 07/06/2016   Treated with doxycycline   Neck pain 10/15/2007   Centricity Description: NECK PAIN Qualifier: Diagnosis of  By: Nena Jordan  Centricity Description: CERVICALGIA Qualifier: Diagnosis of  By: Jonny Ruiz MD, Len Blalock    Nondisplaced fracture of middle phalanx of right index finger, initial encounter for closed fracture 05/25/2022   Patellar tendinitis of left knee 08/30/2021   Plantar fasciitis of right foot 03/17/2021   Prepatellar bursitis  of left knee 08/30/2021   Shoulder impingement syndrome, left 11/07/2013   SINUSITIS- ACUTE-NOS 07/29/2009   Thoracic back pain 12/20/2015   THYROID NODULE, RIGHT 11/20/2008   Toe injury, left, initial encounter 05/22/2019   Type 2 diabetes mellitus without complication, without long-term current use of insulin (HCC) 06/05/2007   Qualifier: Diagnosis of  By: Cheri Guppy      Past Surgical History:  Procedure Laterality Date   back surgury     lumbar 2001   CESAREAN SECTION     x 3   thyroid fine needle aspiration  May 2008   showed non neoplastic goiter   VAGINAL HYSTERECTOMY     fibroids     PHYSICAL EXAM:  VS: BP 122/80 (BP Location: Left Arm, Patient Position: Sitting)   Ht 5\' 4"  (1.626 m)   Wt 197 lb (89.4 kg)   BMI 33.81 kg/m  Physical Exam Gen: NAD, alert, cooperative with exam, well-appearing MSK:  Neurovascularly intact       ASSESSMENT & PLAN:   Scapular dysfunction Acutely occurring with right-sided medial border scapular pain.  Having dysfunction causing the pain and less likely for cervical radicular type pain. -Counseled on home exercise therapy and supportive care. -Could consider trigger point injection or physical therapy

## 2023-05-02 ENCOUNTER — Emergency Department (HOSPITAL_BASED_OUTPATIENT_CLINIC_OR_DEPARTMENT_OTHER)
Admission: EM | Admit: 2023-05-02 | Discharge: 2023-05-02 | Disposition: A | Discharge status: Home / Self Care | Payer: Medicaid Other | Attending: Emergency Medicine | Admitting: Emergency Medicine

## 2023-05-02 ENCOUNTER — Other Ambulatory Visit: Payer: Self-pay

## 2023-05-02 ENCOUNTER — Encounter (HOSPITAL_BASED_OUTPATIENT_CLINIC_OR_DEPARTMENT_OTHER): Payer: Self-pay

## 2023-05-02 ENCOUNTER — Emergency Department (HOSPITAL_BASED_OUTPATIENT_CLINIC_OR_DEPARTMENT_OTHER): Payer: Medicaid Other

## 2023-05-02 DIAGNOSIS — S92524A Nondisplaced fracture of medial phalanx of right lesser toe(s), initial encounter for closed fracture: Secondary | ICD-10-CM | POA: Diagnosis not present

## 2023-05-02 DIAGNOSIS — I10 Essential (primary) hypertension: Secondary | ICD-10-CM | POA: Diagnosis not present

## 2023-05-02 DIAGNOSIS — S92514A Nondisplaced fracture of proximal phalanx of right lesser toe(s), initial encounter for closed fracture: Secondary | ICD-10-CM | POA: Diagnosis not present

## 2023-05-02 DIAGNOSIS — S92912A Unspecified fracture of left toe(s), initial encounter for closed fracture: Secondary | ICD-10-CM

## 2023-05-02 DIAGNOSIS — S99921A Unspecified injury of right foot, initial encounter: Secondary | ICD-10-CM | POA: Diagnosis present

## 2023-05-02 DIAGNOSIS — M7731 Calcaneal spur, right foot: Secondary | ICD-10-CM | POA: Diagnosis not present

## 2023-05-02 DIAGNOSIS — E119 Type 2 diabetes mellitus without complications: Secondary | ICD-10-CM | POA: Diagnosis not present

## 2023-05-02 DIAGNOSIS — W268XXA Contact with other sharp object(s), not elsewhere classified, initial encounter: Secondary | ICD-10-CM | POA: Insufficient documentation

## 2023-05-02 DIAGNOSIS — S92515A Nondisplaced fracture of proximal phalanx of left lesser toe(s), initial encounter for closed fracture: Secondary | ICD-10-CM | POA: Diagnosis not present

## 2023-05-02 DIAGNOSIS — S92911A Unspecified fracture of right toe(s), initial encounter for closed fracture: Secondary | ICD-10-CM

## 2023-05-02 DIAGNOSIS — M79671 Pain in right foot: Secondary | ICD-10-CM | POA: Diagnosis not present

## 2023-05-02 NOTE — ED Notes (Signed)
Reviewed discharge instructions and follow up with pt. Pt states understanding. Ambulatory at discharge 

## 2023-05-02 NOTE — ED Triage Notes (Signed)
States hit right 3rd toe on bedframe in June. Then the first week of July hit her left 3rd toe. States she thinks she broke them. Ambulatory without difficulty. Continues to have pain.

## 2023-05-02 NOTE — ED Provider Notes (Signed)
[place] [facility] AT [facility] Provider Note   CSN: [id number] Arrival date & time: 05/02/23  0809     History  Chief Complaint  Patient presents with   Toe Pain    Shirley [name] is a 47 y.o. female.   Toe Pain   47 year old female presents emergency department with complaints of right third toe and left third toe pain.  Patient states that she initially struck her right third toe on a bed frame during the first week of June.  Reports persistent pain since onset.  Reports subsequently hitting her left third toe during the first week of July on another object and reports persistent pain.  Has been trying again with orthopedic specialist in the outpatient setting multiple times.  She tried again this morning and was told to come the emergency department for x-rays.  Denies pain elsewhere.  Denies any weakness or sensory deficits distal to injury.  Denies any cuts, scrapes, openings, bleeding.  States has been ambulating on both feet as well as buddy taping toes together.  Past medical history significant for diabetes mellitus type 2, fibromyalgia, cervicalgia, low back pain, thoracic back pain, metatarsalgia of right foot, plantar fasciitis of right foot, contracture of right Achilles tendon, hyperlipidemia, hypertension, chronic pain syndrome, bipolar disorder  Home Medications Prior to Admission medications   Medication Sig Start Date End Date Taking? Authorizing Provider  busPIRone (BUSPAR) 7.5 MG tablet Take 1 tablet (7.5 mg total) by mouth 2 (two) times daily. 02/13/22   Saguier, Ramon Dredge, PA-C  carvedilol (COREG) 12.5 MG tablet Take 12.5 mg by mouth 2 (two) times daily with a meal.    [provider]  conjugated estrogens (PREMARIN) vaginal cream Place 1 Applicatorful vaginally daily. 10/21/21   Levie Heritage, DO  cyclobenzaprine (FLEXERIL) 5 MG tablet Take 1 tablet (5 mg total) by mouth 3 (three) times daily as needed for muscle spasms. 1 tab  po q hs prn tension ha 05/29/22   Myra Rude, MD  diclofenac Sodium (VOLTAREN) 1 % GEL Apply 4 g topically 4 (four) times daily. To affected joint. 04/07/21   Myra Rude, MD  diltiazem (CARDIZEM) 30 MG tablet Take 30 mg by mouth 2 (two) times daily. 08/02/21   [provider]  famotidine (PEPCID) 20 MG tablet TAKE 1 TABLET BY MOUTH 2 TIMES DAILY 08/15/21   Saguier, Ramon Dredge, PA-C  fluticasone [facility]) 50 MCG/ACT nasal spray Place 2 sprays into both nostrils as needed for allergies or rhinitis.    [provider]  hydrOXYzine (ATARAX) 10 MG tablet Take 1 tablet (10 mg total) by mouth 3 (three) times daily as needed for itching. 02/13/22   Saguier, Ramon Dredge, PA-C  meloxicam (MOBIC) 15 MG tablet TAKE ONE TABLET BY MOUTH EVERY MORNING WITH BREAKFAST FOR 2 WEEKS , THEN DAILY AS NEEDED FOR PAIN 10/03/21   Myra Rude, MD  nitroGLYCERIN (NITRODUR - DOSED IN MG/24 HR) 0.2 mg/hr patch Cut and apply 1/4 patch to most painful area q24h. 10/27/21   Myra Rude, MD  prednisoLONE acetate (PRED FORTE) 1 % ophthalmic suspension SMARTSIG:In Eye(s) 01/31/23   [provider]  progesterone (PROMETRIUM) 200 MG capsule Take 1 capsule (200 mg total) by mouth daily. 10/20/21   Levie Heritage, DO  rosuvastatin (CRESTOR) 20 MG tablet Take 20 mg by mouth daily.    [provider]  sitaGLIPtin (JANUVIA) 100 MG tablet Take 1 tablet (100 mg total) by mouth daily. 07/25/21  Saguier, Ramon Dredge, PA-C  valACYclovir (VALTREX) 1000 MG tablet Take 1 tablet (1,000 mg total) by mouth 2 (two) times daily. 03/13/23   Olive Bass, FNP  valACYclovir (VALTREX) 500 MG tablet Take 1 tablet (500 mg total) by mouth 2 (two) times daily. 03/13/23   Olive Bass, FNP      Allergies    Sulfa antibiotics, Contrast media [iodinated contrast media], Gadolinium derivatives, Promethazine hcl, and Reglan [metoclopramide]    Review of Systems   Review of Systems  All other systems  reviewed and are negative.   Physical Exam Updated Vital Signs BP (!) 133/93 (BP Location: Right Arm)   Pulse 99   Temp 98.9 F (37.2 C) (Oral)   Resp 18   Ht 5\' 4"  (1.626 m)   Wt 88.5 kg   SpO2 99%   BMI 33.47 kg/m  Physical Exam Vitals and nursing note reviewed.  Constitutional:      General: She is not in acute distress.    Appearance: She is well-developed.  HENT:     Head: Normocephalic and atraumatic.  Eyes:     Conjunctiva/sclera: Conjunctivae normal.  Cardiovascular:     Rate and Rhythm: Normal rate and regular rhythm.     Heart sounds: No murmur heard. Pulmonary:     Effort: Pulmonary effort is normal. No respiratory distress.     Breath sounds: Normal breath sounds.  Abdominal:     Palpations: Abdomen is soft.     Tenderness: There is no abdominal tenderness.  Musculoskeletal:        General: No swelling.     Cervical back: Neck supple.     Right lower leg: No edema.     Left lower leg: No edema.     Comments: Patient is full range of motion of bilateral ankles, digits on feet.  Patient with tender palpation of third digit on both feet.  No obvious breaks in skin, erythema, palpable fluctuance, induration.  Capillary fill less than 2 seconds.  Pedal pulses 2+ bilaterally.  Patient with muscle strength 5 out of 5 in ankle dorsi/plantarflexion.  Skin:    General: Skin is warm and dry.     Capillary Refill: Capillary refill takes less than 2 seconds.  Neurological:     Mental Status: She is alert.  Psychiatric:        Mood and Affect: Mood normal.     ED Results / Procedures / Treatments   Labs (all labs ordered are listed, but only abnormal results are displayed) Labs Reviewed - No data to display  EKG None  Radiology DG Foot Complete Right  Result Date: 05/02/2023 CLINICAL DATA:  Bilateral foot pain. Injured right third toe 03/2023. EXAM: RIGHT FOOT COMPLETE - 3+ VIEW COMPARISON:  None Available. FINDINGS: There is transverse linear lucency within  the distal shaft of the proximal phalanx of the third toe with mild adjacent likely early peripheral callus formation but persistent fracture line lucency. Moderate plantar and posterior calcaneal heel spurs. Minimal dorsal talonavicular navicular-cuneiform degenerative osteophytes. IMPRESSION: Likely subacute, nondisplaced extra-articular fracture of the proximal phalanx of the third toe. Electronically Signed   By: Neita Garnet M.D.   On: 05/02/2023 09:19   DG Foot Complete Left  Result Date: 05/02/2023 CLINICAL DATA:  Injured left third toe this month. EXAM: LEFT FOOT - COMPLETE 3+ VIEW COMPARISON:  Left ankle radiographs 10/15/2007 FINDINGS: There is oblique linear lucency indicating an acute fracture of the shaft of the proximal phalanx of the third  toe with up to 1 mm diastasis but otherwise no significant displacement. No intra-articular extension. Moderate plantar calcaneal heel spur measuring up to approximately 8 mm in length. At the adjacent proximal aspect of the plantar fascia there is additional chronic enthesopathic ossific density measuring approximately 15 mm length of the plantar fascia. Minimal chronic enthesopathic change at the Achilles insertion on the calcaneus. Mild dorsal talonavicular degenerative osteophytosis. IMPRESSION: 1. Acute nondisplaced fracture of the proximal phalanx of the third toe. 2. Chronic enthesopathic changes of the plantar fascia origin as above. 3. Mild dorsal talonavicular degenerative osteophytosis. Electronically Signed   By: Neita Garnet M.D.   On: 05/02/2023 09:17    Procedures Procedures    Medications Ordered in ED Medications - No data to display  ED Course/ Medical Decision Making/ A&P Clinical Course as of 05/02/23 0950  Wed May 02, 2023  4010 DG Foot Complete Right [CR]    Clinical Course User Index [CR] Peter Garter, PA                             Medical Decision Making Amount and/or Complexity of Data Reviewed Radiology:  ordered.   This patient presents to the ED for concern of toe pain, this involves an extensive number of treatment options, and is a complaint that carries with it a high risk of complications and morbidity.  The differential diagnosis includes fracture, dislocation, strain/sprain, ligamentous/tendinous injury, neurovascular compromise, gout, septic arthritis, osteoarthritis   Co morbidities that complicate the patient evaluation  See HPI   Additional history obtained:  Additional history obtained from EMR External records from outside source obtained and reviewed including hospital records   Lab Tests:  N/a   Imaging Studies ordered:  I ordered imaging studies including left/right foot x-ray I independently visualized and interpreted imaging which showed  Left foot x-ray: Acute nondisplaced fracture of proximal phalanx of third toe.  Chronic enthesopathic changes of plantar fascia.  Mild dorsal talonavicular degenerative osteophytosis. Right foot x-ray: Subacute nondisplaced extra-articular fracture of proximal phalanx of third toe I agree with the radiologist interpretation  Cardiac Monitoring: / EKG:  The patient was maintained on a cardiac monitor.  I personally viewed and interpreted the cardiac monitored which showed an underlying rhythm of: Sinus rhythm   Consultations Obtained:  N/a   Problem List / ED Course / Critical interventions / Medication management  Toe pain Reevaluation of the patient showed that the patient stayed the same I have reviewed the patients home medicines and have made adjustments as needed   Social Determinants of Health:  Denies tobacco, illicit drug use   Test / Admission - Considered:  Toe pain Vitals signs within normal range and stable throughout visit. Imaging studies significant for: See above 47 year old female presents emergency department with complaints of bilateral third toe pain after striking objects in the  outpatient setting 1 and 2 months ago.  Patient found with evidence of subacute fracture of right third digit as well as acute fracture of left third digit of feet.  Shared decision made conversation was had with patient regarding postop shoe versus buddy taping digits which patient elected for buddy taping.  No clinical indication of secondary infectious process or neurovascular compromise.  Recommend close follow-up with her preferred orthopedic specialist in the outpatient setting.  In the meantime, symptomatic therapy recommended with rest, ice, elevation, NSAID.  Treatment plan discussed at length with patient and she acknowledged understanding was  agreeable to said plan.  Patient overall well-appearing, afebrile in no acute distress. Worrisome signs and symptoms were discussed with the patient, and the patient acknowledged understanding to return to the ED if noticed. Patient was stable upon discharge.          Final Clinical Impression(s) / ED Diagnoses Final diagnoses:  Closed nondisplaced fracture of phalanx of toe of left foot, unspecified toe, initial encounter  Closed nondisplaced fracture of phalanx of toe of right foot, unspecified toe, initial encounter    Rx / DC Orders ED Discharge Orders     None         Peter Garter, PA 05/02/23 1610    Rolan Bucco, MD 05/02/23 1131

## 2023-05-02 NOTE — Discharge Instructions (Signed)
As discussed, your with evidence of fracture of the third toe on your left foot as well as a 3 to 1-year right foot.  Recommend continuing to buddy tape toes together to help support feet as well as wearing well protective shoes as we discussed.  Recommend rest, ice, elevation of your feet as well as treatment of pain with NSAIDs.  Recommend follow-up with your orthopedic specialist in the outpatient setting.  Please do not hesitate to return to emergency department for worrisome signs and symptoms we discussed become apparent.

## 2023-05-10 DIAGNOSIS — R748 Abnormal levels of other serum enzymes: Secondary | ICD-10-CM | POA: Insufficient documentation

## 2023-05-10 DIAGNOSIS — K600 Acute anal fissure: Secondary | ICD-10-CM | POA: Insufficient documentation

## 2023-05-10 DIAGNOSIS — K5904 Chronic idiopathic constipation: Secondary | ICD-10-CM | POA: Insufficient documentation

## 2023-05-10 DIAGNOSIS — R112 Nausea with vomiting, unspecified: Secondary | ICD-10-CM | POA: Insufficient documentation

## 2023-05-10 DIAGNOSIS — R1011 Right upper quadrant pain: Secondary | ICD-10-CM | POA: Insufficient documentation

## 2023-05-10 DIAGNOSIS — K760 Fatty (change of) liver, not elsewhere classified: Secondary | ICD-10-CM | POA: Insufficient documentation

## 2023-05-10 DIAGNOSIS — E1165 Type 2 diabetes mellitus with hyperglycemia: Secondary | ICD-10-CM | POA: Insufficient documentation

## 2023-05-10 DIAGNOSIS — K5900 Constipation, unspecified: Secondary | ICD-10-CM | POA: Insufficient documentation

## 2023-05-18 ENCOUNTER — Encounter: Payer: Self-pay | Admitting: Physician Assistant

## 2023-05-18 ENCOUNTER — Ambulatory Visit: Payer: Medicaid Other | Admitting: Physician Assistant

## 2023-05-18 ENCOUNTER — Ambulatory Visit: Payer: Medicaid Other | Admitting: Cardiology

## 2023-05-18 VITALS — BP 152/92 | HR 98 | Temp 97.4°F | Resp 20 | Wt 194.0 lb

## 2023-05-18 DIAGNOSIS — I152 Hypertension secondary to endocrine disorders: Secondary | ICD-10-CM | POA: Diagnosis not present

## 2023-05-18 DIAGNOSIS — F419 Anxiety disorder, unspecified: Secondary | ICD-10-CM | POA: Diagnosis not present

## 2023-05-18 DIAGNOSIS — E1159 Type 2 diabetes mellitus with other circulatory complications: Secondary | ICD-10-CM | POA: Diagnosis not present

## 2023-05-18 DIAGNOSIS — S92534A Nondisplaced fracture of distal phalanx of right lesser toe(s), initial encounter for closed fracture: Secondary | ICD-10-CM

## 2023-05-18 DIAGNOSIS — E1165 Type 2 diabetes mellitus with hyperglycemia: Secondary | ICD-10-CM

## 2023-05-18 MED ORDER — BUSPIRONE HCL 7.5 MG PO TABS: 7.5000 mg | ORAL_TABLET | Freq: Two times a day (BID) | ORAL | 0 refills | Status: AC

## 2023-05-18 MED ORDER — SITAGLIPTIN PHOSPHATE 100 MG PO TABS: 100.0000 mg | ORAL_TABLET | Freq: Every day | ORAL | 0 refills | Status: DC

## 2023-05-18 NOTE — Progress Notes (Signed)
Established patient visit   Patient: Shirley [name]   DOB: [birth year]   47 y.o. Female  MRN: [id number] Visit Date: 05/18/2023  Today's healthcare provider: Alfredia Ferguson, PA-C   C. Toe pain, broken toes  Subjective    HPI Pt was seen in ED 05/02/23 for fracture of her third toe on bother her left and right feet. These digits were buddy taped. She reports continued pain and swelling of the right third toe.  Pt also reports overall fatigue, emotional stress, body aches. Reports distant diagnosis of fibromyalgia.  Pt reports benefit from buspar in the past, but she has run out. Denies SE historically.   Reports not taking her meds for diabetes, not consistently checking. Last time she checked it was in the 300s.  Medications: Outpatient Medications Prior to Visit  Medication Sig   baclofen (LIORESAL) 10 MG tablet Take 10 mg by mouth 2 (two) times daily.   carvedilol (COREG) 12.5 MG tablet Take 12.5 mg by mouth 2 (two) times daily with a meal.   conjugated estrogens (PREMARIN) vaginal cream Place 1 Applicatorful vaginally daily.   cyclobenzaprine (FLEXERIL) 5 MG tablet Take 1 tablet (5 mg total) by mouth 3 (three) times daily as needed for muscle spasms. 1 tab po q hs prn tension ha   diclofenac Sodium (VOLTAREN) 1 % GEL Apply 4 g topically 4 (four) times daily. To affected joint.   diltiazem (CARDIZEM) 30 MG tablet Take 30 mg by mouth 2 (two) times daily.   famotidine (PEPCID) 20 MG tablet TAKE 1 TABLET BY MOUTH 2 TIMES DAILY   fluticasone (FLONASE) 50 MCG/ACT nasal spray Place 2 sprays into both nostrils as needed for allergies or rhinitis.   gabapentin (NEURONTIN) 100 MG capsule Take 100 mg by mouth at bedtime.   nitroGLYCERIN (NITRODUR - DOSED IN MG/24 HR) 0.2 mg/hr patch Cut and apply 1/4 patch to most painful area q24h.   prednisoLONE acetate (PRED FORTE) 1 % ophthalmic suspension SMARTSIG:In Eye(s)   progesterone (PROMETRIUM) 200 MG capsule Take 1 capsule (200 mg  total) by mouth daily.   rosuvastatin (CRESTOR) 20 MG tablet Take 20 mg by mouth daily.   valACYclovir (VALTREX) 1000 MG tablet Take 1 tablet (1,000 mg total) by mouth 2 (two) times daily.   valACYclovir (VALTREX) 500 MG tablet Take 1 tablet (500 mg total) by mouth 2 (two) times daily.   [DISCONTINUED] busPIRone (BUSPAR) 7.5 MG tablet Take 1 tablet (7.5 mg total) by mouth 2 (two) times daily.   [DISCONTINUED] hydrOXYzine (ATARAX) 10 MG tablet Take 1 tablet (10 mg total) by mouth 3 (three) times daily as needed for itching.   [DISCONTINUED] meloxicam (MOBIC) 15 MG tablet TAKE ONE TABLET BY MOUTH EVERY MORNING WITH BREAKFAST FOR 2 WEEKS , THEN DAILY AS NEEDED FOR PAIN   [DISCONTINUED] metoprolol tartrate (LOPRESSOR) 25 MG tablet Take 25 mg by mouth 2 (two) times daily.   [DISCONTINUED] sitaGLIPtin (JANUVIA) 100 MG tablet Take 1 tablet (100 mg total) by mouth daily.   No facility-administered medications prior to visit.   Review of Systems  Constitutional:  Positive for fatigue. Negative for fever.  Respiratory:  Negative for cough and shortness of breath.   Cardiovascular:  Negative for chest pain and leg swelling.  Gastrointestinal:  Negative for abdominal pain.  Musculoskeletal:  Positive for arthralgias and myalgias.  Neurological:  Negative for dizziness and headaches.  Psychiatric/Behavioral:  The patient is nervous/anxious.       Objective  BP (!) 152/92   Pulse 98   Temp (!) 97.4 F (36.3 C) (Oral)   Resp 20   Wt 194 lb (88 kg)   SpO2 100%   BMI 33.30 kg/m   Physical Exam Vitals reviewed.  Constitutional:      Appearance: She is not ill-appearing.  HENT:     Head: Normocephalic.  Eyes:     Conjunctiva/sclera: Conjunctivae normal.  Cardiovascular:     Rate and Rhythm: Normal rate.  Pulmonary:     Effort: Pulmonary effort is normal. No respiratory distress.  Musculoskeletal:     Comments: Right foot with some minor ecchymosis to the 3rd digit. Tender along the DIP   Neurological:     General: No focal deficit present.     Mental Status: She is alert and oriented to person, place, and time.  Psychiatric:        Mood and Affect: Mood normal.        Behavior: Behavior normal.      No results found for any visits on 05/18/23.  Assessment & Plan     Problem List Items Addressed This Visit       Cardiovascular and Mediastinum   Hypertension associated with diabetes (HCC)    Elevated in office. Pt follows with cardiology, missed appt with them today. She is going up to reschedule. Should be managed on carvedilol 12.5 bid and dilt 30 mg bid, compliance uncertain did not discuss this in detail today      Relevant Medications   sitaGLIPtin (JANUVIA) 100 MG tablet     Endocrine   Type 2 diabetes mellitus without complication, without long-term current use of insulin (HCC)    Uncontrolled, needs updated labs. Pt declines today. She checks sugar occasionally. Reports tolerating [place] well in the past. Refilled today. Encouraged pt to make earlier appt than 4/25 for labs. Pt is agreeable to reschedule earlier At that visit will need A1c, cmp and uacr      Relevant Medications   sitaGLIPtin (JANUVIA) 100 MG tablet     Other   Anxiety    Pt reports tolerating buspar well historically, but has ran out Refilled buspar 7.5 mg bid.  Would ideally f/u in 6-8 weeks, pt prefers to push out      Relevant Medications   busPIRone (BUSPAR) 7.5 MG tablet   Other Visit Diagnoses     Closed nondisplaced fracture of distal phalanx of lesser toe of right foot, initial encounter    -  Primary      1. Closed nondisplaced fracture of distal phalanx of lesser toe of right foot, initial encounter At this point, injury occurred in June, recommending movement, elevation, ice PRN.     Return in about 4 months (around 09/18/2023) for anxiety, DMII, hypertension.      I, Alfredia Ferguson, PA-C have reviewed all documentation for this visit. The  documentation on  05/18/23   for the exam, diagnosis, procedures, and orders are all accurate and complete.  Alfredia Ferguson, PA-C  [facility] at [facility] [phone] (phone) [phone] (fax)  [facility]

## 2023-05-18 NOTE — Assessment & Plan Note (Addendum)
Elevated in office. Pt follows with cardiology, missed appt with them today. She is going up to reschedule. Should be managed on carvedilol 12.5 bid and dilt 30 mg bid, compliance uncertain did not discuss this in detail today

## 2023-05-18 NOTE — Assessment & Plan Note (Signed)
Pt reports tolerating buspar well historically, but has ran out Refilled buspar 7.5 mg bid.  Would ideally f/u in 6-8 weeks, pt prefers to push out

## 2023-05-18 NOTE — Assessment & Plan Note (Signed)
Uncontrolled, needs updated labs. Pt declines today. She checks sugar occasionally. Reports tolerating [place] well in the past. Refilled today. Encouraged pt to make earlier appt than 4/25 for labs. Pt is agreeable to reschedule earlier At that visit will need A1c, cmp and uacr

## 2023-05-21 ENCOUNTER — Other Ambulatory Visit (HOSPITAL_COMMUNITY): Payer: Self-pay

## 2023-05-21 ENCOUNTER — Telehealth: Payer: Self-pay

## 2023-05-21 NOTE — Telephone Encounter (Signed)
Patient advised of prior authorization approval on Januvia 100 mg for 05/18/2023 to 05/17/2024 and test claim show copay of 4.00. Patient verbalized understanding and states that Karin Golden called and will not have medication until 05/23/2023 at 4:00 pm. Patient states she will take medication.

## 2023-05-21 NOTE — Telephone Encounter (Signed)
Pharmacy Patient Advocate Encounter  Received notification from [facility] that Prior Authorization for Januvia 100mg   has been APPROVED from 05/18/23 to 05/17/24. Ran test claim, Copay is $4.00.

## 2023-05-24 ENCOUNTER — Ambulatory Visit: Payer: Medicaid Other | Admitting: Family

## 2023-05-24 ENCOUNTER — Encounter: Payer: Self-pay | Admitting: Family

## 2023-05-24 VITALS — BP 128/78 | HR 87 | Ht 64.0 in | Wt 192.8 lb

## 2023-05-24 DIAGNOSIS — E119 Type 2 diabetes mellitus without complications: Secondary | ICD-10-CM | POA: Diagnosis not present

## 2023-05-24 DIAGNOSIS — R300 Dysuria: Secondary | ICD-10-CM

## 2023-05-24 DIAGNOSIS — Z7984 Long term (current) use of oral hypoglycemic drugs: Secondary | ICD-10-CM

## 2023-05-24 LAB — POCT URINALYSIS DIPSTICK
Blood, UA: NEGATIVE
Glucose, UA: POSITIVE — AB
Ketones, UA: NEGATIVE
Nitrite, UA: NEGATIVE
Protein, UA: POSITIVE — AB
Spec Grav, UA: 1.02 (ref 1.010–1.025)
Urobilinogen, UA: 0.2 E.U./dL
pH, UA: 5 (ref 5.0–8.0)

## 2023-05-24 MED ORDER — FLUCONAZOLE 150 MG PO TABS: ORAL_TABLET | ORAL | 0 refills | Status: DC

## 2023-05-24 NOTE — Progress Notes (Signed)
Shirley [name] is a 47 y.o. female with the following history as recorded in EpicCare:  Patient Active Problem List   Diagnosis Date Noted   Anxiety 05/18/2023   Fatty liver 05/10/2023   Chronic idiopathic constipation 05/10/2023   Acute anal fissure 05/10/2023   Scapular dysfunction 03/16/2023   Adhesive capsulitis of finger 12/22/2022   Hypertension associated with diabetes (HCC) 08/01/2022   Patellar tendinitis of left knee 08/30/2021   Prepatellar bursitis of left knee 08/30/2021   Lumbar radiculopathy 08/30/2021   Achilles tendinitis of right lower extremity 04/07/2021   Contracture of right Achilles tendon 03/28/2021   Plantar fasciitis of right foot 03/17/2021   ADHD (attention deficit hyperactivity disorder)    MRSA (methicillin resistant staph aureus) urine culture positive on 07/06/16 07/06/2016   Thoracic back pain 12/20/2015   Shoulder impingement syndrome, left 11/07/2013   Chronic pain syndrome 03/21/2011   THYROID NODULE, RIGHT 11/20/2008   GERD 06/26/2008   Fibromyalgia 06/26/2008   ANEMIA-IRON DEFICIENCY 06/26/2008   Hyperlipidemia associated with type 2 diabetes mellitus (HCC) 09/13/2007   Type 2 diabetes mellitus without complication, without long-term current use of insulin (HCC) 06/05/2007    Current Outpatient Medications  Medication Sig Dispense Refill   baclofen (LIORESAL) 10 MG tablet Take 10 mg by mouth 2 (two) times daily.     busPIRone (BUSPAR) 7.5 MG tablet Take 1 tablet (7.5 mg total) by mouth 2 (two) times daily. 180 tablet 0   carvedilol (COREG) 12.5 MG tablet Take 12.5 mg by mouth 2 (two) times daily with a meal.     conjugated estrogens (PREMARIN) vaginal cream Place 1 Applicatorful vaginally daily. 42.5 g 12   cyclobenzaprine (FLEXERIL) 5 MG tablet Take 1 tablet (5 mg total) by mouth 3 (three) times daily as needed for muscle spasms. 1 tab po q hs prn tension ha 45 tablet 1   diclofenac Sodium (VOLTAREN) 1 % GEL Apply 4 g topically 4 (four)  times daily. To affected joint. 100 g 11   diltiazem (CARDIZEM) 30 MG tablet Take 30 mg by mouth 2 (two) times daily.     famotidine (PEPCID) 20 MG tablet TAKE 1 TABLET BY MOUTH 2 TIMES DAILY 60 tablet 0   fluconazole (DIFLUCAN) 150 MG tablet Take 1 tablet daily; repeat after 72 hours 2 tablet 0   fluticasone (FLONASE) 50 MCG/ACT nasal spray Place 2 sprays into both nostrils as needed for allergies or rhinitis.     gabapentin (NEURONTIN) 100 MG capsule Take 100 mg by mouth at bedtime.     nitroGLYCERIN (NITRODUR - DOSED IN MG/24 HR) 0.2 mg/hr patch Cut and apply 1/4 patch to most painful area q24h. 30 patch 11   prednisoLONE acetate (PRED FORTE) 1 % ophthalmic suspension SMARTSIG:In Eye(s)     progesterone (PROMETRIUM) 200 MG capsule Take 1 capsule (200 mg total) by mouth daily. 90 capsule 3   rosuvastatin (CRESTOR) 20 MG tablet Take 20 mg by mouth daily.     sitaGLIPtin (JANUVIA) 100 MG tablet Take 1 tablet (100 mg total) by mouth daily. 90 tablet 0   No current facility-administered medications for this visit.    Allergies: Sulfa antibiotics, Contrast media [iodinated contrast media], Gadolinium derivatives, Promethazine hcl, and Reglan [metoclopramide]  Past Medical History:  Diagnosis Date   Achilles tendinitis of right lower extremity 04/07/2021   ADHD (attention deficit hyperactivity disorder)    ANEMIA-IRON DEFICIENCY 06/26/2008   Ankle impingement syndrome, right 03/01/2021   BIPOLAR DISORDER UNSPECIFIED 09/13/2007   Cervicalgia  10/15/2007   Chronic pain syndrome 03/21/2011   Contracture of right Achilles tendon 03/28/2021   COVID-19 virus infection 11/24/2020   DYSPNEA 12/05/2007   ELEVATED BP READING WITHOUT DX HYPERTENSION 07/29/2009   Encounter for long-term (current) use of high-risk medication 05/29/2011   Fibromyalgia 06/26/2008   Qualifier: Diagnosis of  By: Jonny Ruiz MD, Len Blalock    GERD 06/26/2008   Hyperlipidemia 09/13/2007   Qualifier: Diagnosis of  By: Nena Jordan     JOINT EFFUSION, RIGHT KNEE 07/29/2009   Low back pain radiating to right leg 12/01/2015   Lumbar radiculopathy 08/30/2021   Metatarsalgia of right foot 03/01/2021   MRSA (methicillin resistant staph aureus) urine culture positive on 07/06/16 07/06/2016   Treated with doxycycline   Neck pain 10/15/2007   Centricity Description: NECK PAIN Qualifier: Diagnosis of  By: Nena Jordan  Centricity Description: CERVICALGIA Qualifier: Diagnosis of  By: Jonny Ruiz MD, Len Blalock    Nondisplaced fracture of middle phalanx of right index finger, initial encounter for closed fracture 05/25/2022   Patellar tendinitis of left knee 08/30/2021   Plantar fasciitis of right foot 03/17/2021   Prepatellar bursitis of left knee 08/30/2021   Shoulder impingement syndrome, left 11/07/2013   SINUSITIS- ACUTE-NOS 07/29/2009   Thoracic back pain 12/20/2015   THYROID NODULE, RIGHT 11/20/2008   Toe injury, left, initial encounter 05/22/2019   Type 2 diabetes mellitus without complication, without long-term current use of insulin (HCC) 06/05/2007   Qualifier: Diagnosis of  By: Cheri Guppy      Past Surgical History:  Procedure Laterality Date   back surgury     lumbar 2001   CESAREAN SECTION     x 3   thyroid fine needle aspiration  May 2008   showed non neoplastic goiter   VAGINAL HYSTERECTOMY     fibroids    Family History  Problem Relation Age of Onset   Thyroid disease Mother    Diabetes Mother    Asthma Mother    Hypertension Father    Hyperlipidemia Father    Diabetes Father    Emphysema Other    Coronary artery disease Other    Cancer Other        pancreatic and breast cancer   Cancer Other        lung and prostate cancer   Breast cancer Neg Hx     Social History   Tobacco Use   Smoking status: Never   Smokeless tobacco: Never  Substance Use Topics   Alcohol use: No    Subjective:   Concerned for possible yeast infection- has been having recurrent symptoms since taking antibiotics recently;   Readily admits that she has not been able to take care of herself/ manage her diabetes but is working to put herself and her needs back as a priority; does not want to update labs today but agrees to come back at later date;   Objective:  Vitals:   05/24/23 1058  BP: 128/78  Pulse: 87  SpO2: 98%  Weight: 192 lb 12.8 oz (87.5 kg)  Height: 5\' 4"  (1.626 m)    General: Well developed, well nourished, in no acute distress  Skin : Warm and dry.  Head: Normocephalic and atraumatic  Eyes: Sclera and conjunctiva clear; pupils round and reactive to light; extraocular movements intact  Ears: External normal; canals clear; tympanic membranes normal  Oropharynx: Pink, supple. No suspicious lesions  Neck: Supple without thyromegaly, adenopathy  Lungs: Respirations unlabored;  Neurologic:  Alert and oriented; speech intact; face symmetrical; moves all extremities well; CNII-XII intact without focal deficit  Assessment:  1. Dysuria   2. Type 2 diabetes mellitus without complication, without long-term current use of insulin (HCC)     Plan:  Check urine culture; Rx for Diflucan- take as directed; Patient defers updating labs today but does agree to return in about a month; she notes she would be willing to discuss new oral medications like Rybelsus or Farixga; encouraged patient to continue with her plan to being committed to herself and keep follow up appointment with cardiology as well.   Return for Labs 06/29/2023.  Orders Placed This Encounter  Procedures   Urine Culture   CBC with Differential/Platelet    Standing Status:   Future    Standing Expiration Date:   05/23/2024   Comp Met (CMET)    Standing Status:   Future    Standing Expiration Date:   05/23/2024   Hemoglobin A1c    Standing Status:   Future    Standing Expiration Date:   05/23/2024   POCT Urinalysis Dipstick    Requested Prescriptions   Signed Prescriptions Disp Refills   fluconazole (DIFLUCAN) 150 MG tablet 2 tablet 0     Sig: Take 1 tablet daily; repeat after 72 hours

## 2023-05-28 ENCOUNTER — Other Ambulatory Visit: Payer: Self-pay | Admitting: Family

## 2023-05-28 MED ORDER — PENICILLIN V POTASSIUM 500 MG PO TABS: 500.0000 mg | ORAL_TABLET | Freq: Three times a day (TID) | ORAL | 0 refills | Status: AC

## 2023-06-29 ENCOUNTER — Ambulatory Visit: Payer: Medicaid Other

## 2023-06-29 ENCOUNTER — Other Ambulatory Visit: Payer: Medicaid Other

## 2023-08-17 ENCOUNTER — Other Ambulatory Visit: Payer: Self-pay | Admitting: Physician Assistant

## 2023-08-17 DIAGNOSIS — E1165 Type 2 diabetes mellitus with hyperglycemia: Secondary | ICD-10-CM

## 2023-08-23 ENCOUNTER — Ambulatory Visit: Payer: Medicaid Other | Admitting: Family Medicine

## 2023-08-23 ENCOUNTER — Other Ambulatory Visit (HOSPITAL_COMMUNITY)
Admission: RE | Admit: 2023-08-23 | Discharge: 2023-08-23 | Disposition: A | Discharge status: Home / Self Care | Payer: Medicaid Other | Source: Ambulatory Visit | Attending: Family Medicine | Admitting: Family Medicine

## 2023-08-23 ENCOUNTER — Encounter: Payer: Self-pay | Admitting: Family Medicine

## 2023-08-23 VITALS — BP 162/92 | HR 102

## 2023-08-23 DIAGNOSIS — Z7984 Long term (current) use of oral hypoglycemic drugs: Secondary | ICD-10-CM

## 2023-08-23 DIAGNOSIS — I152 Hypertension secondary to endocrine disorders: Secondary | ICD-10-CM

## 2023-08-23 DIAGNOSIS — E1159 Type 2 diabetes mellitus with other circulatory complications: Secondary | ICD-10-CM | POA: Diagnosis not present

## 2023-08-23 DIAGNOSIS — B3731 Acute candidiasis of vulva and vagina: Secondary | ICD-10-CM

## 2023-08-23 DIAGNOSIS — N952 Postmenopausal atrophic vaginitis: Secondary | ICD-10-CM

## 2023-08-23 DIAGNOSIS — Z01419 Encounter for gynecological examination (general) (routine) without abnormal findings: Secondary | ICD-10-CM

## 2023-08-23 MED ORDER — PREMARIN 0.625 MG/GM VA CREA: 1.0000 | TOPICAL_CREAM | Freq: Every day | VAGINAL | 12 refills | Status: DC

## 2023-08-23 MED ORDER — PROGESTERONE 200 MG PO CAPS: 200.0000 mg | ORAL_CAPSULE | Freq: Every day | ORAL | 3 refills | Status: DC

## 2023-08-23 MED ORDER — FLUCONAZOLE 150 MG PO TABS
ORAL_TABLET | ORAL | 0 refills | Status: DC
Start: 2023-08-23 — End: 2024-07-24

## 2023-08-23 NOTE — Progress Notes (Signed)
ANNUAL EXAM Patient name: Shirley [name] MRN [id number]  Date of birth: 1976-01-12 Chief Complaint:   Annual Exam  History of Present Illness:   Shirley [name] is a 47 y.o.  [phone]  female  being seen today for a routine annual exam.  Current complaints: Had episode of vaginal bleeding a couple months ago.  She was lying in bed and felt a lot of vaginal discomfort.  She thought something was sticking out of her vagina.  She went to the bathroom placed 2 fingers inside the vagina and had maroon-colored blood on them when she removed them.  She did not have any bleeding where she needs to put on a pad.  She did have a fair amount of vaginal irritation, she thinks was due to medication she was taking. It has not occurred again.  SHe does have h/o vaginal hysterectomy.  No LMP recorded. Patient has had a hysterectomy.    Last pap prior to hysterectomy. H/O abnormal pap: no Last mammogram: 2021. Results were: abnormal - [name] and biopsy done . Family h/o breast cancer: no     02/15/2023    1:06 PM 12/20/2015    4:00 PM  Depression screen PHQ 2/9  Decreased Interest 0 0  Down, Depressed, Hopeless 0 0  PHQ - 2 Score 0 0         No data to display           Review of Systems:   Pertinent items are noted in HPI Denies any headaches, blurred vision, fatigue, shortness of breath, chest pain, abdominal pain, abnormal vaginal discharge/itching/odor/irritation, problems with periods, bowel movements, urination, or intercourse unless otherwise stated above. Pertinent History Reviewed:  Reviewed past medical,surgical, social and family history.  Reviewed problem list, medications and allergies. Physical Assessment:   Vitals:   08/23/23 1501  BP: (!) 162/92  Pulse: (!) 102  There is no height or weight on file to calculate BMI.        Physical Examination:   General appearance - well appearing, and in no distress  Mental status - alert, oriented to person, place, and  time  Psych:  She has a normal mood and affect  Skin - warm and dry, normal color, no suspicious lesions noted  Chest - effort normal, all lung fields clear to auscultation bilaterally  Heart - normal rate and regular rhythm  Neck:  midline trachea, no thyromegaly or nodules  Breasts - breasts appear normal, no suspicious masses, no skin or nipple changes or axillary nodes  Abdomen - soft, nontender, nondistended, no masses or organomegaly  Pelvic - VULVA: atrophic appearing vulva with no masses, tenderness or lesions  VAGINA: atrophic appearing vagina with normal color and discharge, no lesions. No bleeding visualized. Vaginal cuff intact with no discoloration, discharge, or mass. Cervix and Uterus surgically absent.  Thin prep pap is not done   ADNEXA: No adnexal masses or tenderness noted.  Extremities:  No swelling or varicosities noted  Chaperone present for exam  Assessment & Plan:  1. Well woman exam with routine gynecological exam - MM 3D SCREENING MAMMOGRAM BILATERAL BREAST; Future  2. Yeast vaginitis Culture obtained.  Start diflucan - Cervicovaginal ancillary only  3. Vaginal atrophy Restart progesterone and vaginal estrogen  4. Hypertension associated with diabetes [facility]) Patient taking medications. Has follow up with primary care who is adjusting medications  No orders of the defined types were placed in this encounter.   Meds:  Meds ordered this encounter  Medications   fluconazole (DIFLUCAN) 150 MG tablet    Sig: Take 1 tablet daily; repeat after 72 hours    Dispense:  2 tablet    Refill:  0   progesterone (PROMETRIUM) 200 MG capsule    Sig: Take 1 capsule (200 mg total) by mouth at bedtime.    Dispense:  90 capsule    Refill:  3   conjugated estrogens (PREMARIN) vaginal cream    Sig: Place 1 Applicatorful vaginally daily.    Dispense:  42.5 g    Refill:  12    Follow-up: No follow-ups on file.  Levie Heritage, DO 08/23/2023 3:47 PM

## 2023-08-23 NOTE — Progress Notes (Signed)
CC: Had strange vaginal sensation, stating that something felt like it was coming out.  Noticed that when she inserted her fingers she noticed blood.    Had partial hysterectomy

## 2023-08-27 LAB — CERVICOVAGINAL ANCILLARY ONLY
Bacterial Vaginitis (gardnerella): NEGATIVE
Candida Glabrata: NEGATIVE
Candida Vaginitis: POSITIVE — AB
Comment: NEGATIVE
Comment: NEGATIVE
Comment: NEGATIVE

## 2023-09-03 ENCOUNTER — Inpatient Hospital Stay (HOSPITAL_BASED_OUTPATIENT_CLINIC_OR_DEPARTMENT_OTHER): Admission: RE | Admit: 2023-09-03 | Payer: Medicaid Other | Source: Ambulatory Visit

## 2023-09-10 ENCOUNTER — Encounter (HOSPITAL_BASED_OUTPATIENT_CLINIC_OR_DEPARTMENT_OTHER): Payer: Self-pay

## 2023-09-10 ENCOUNTER — Ambulatory Visit (HOSPITAL_BASED_OUTPATIENT_CLINIC_OR_DEPARTMENT_OTHER)
Admission: RE | Admit: 2023-09-10 | Discharge: 2023-09-10 | Disposition: A | Discharge status: Home / Self Care | Payer: Medicaid Other | Source: Ambulatory Visit | Attending: Family Medicine | Admitting: Family Medicine

## 2023-09-10 DIAGNOSIS — Z1231 Encounter for screening mammogram for malignant neoplasm of breast: Secondary | ICD-10-CM | POA: Insufficient documentation

## 2023-09-10 DIAGNOSIS — Z01419 Encounter for gynecological examination (general) (routine) without abnormal findings: Secondary | ICD-10-CM

## 2023-11-08 ENCOUNTER — Ambulatory Visit: Payer: Medicaid Other | Admitting: Dermatology

## 2023-11-08 ENCOUNTER — Encounter: Payer: Self-pay | Admitting: Dermatology

## 2023-11-08 VITALS — BP 132/82 | HR 108

## 2023-11-08 DIAGNOSIS — L819 Disorder of pigmentation, unspecified: Secondary | ICD-10-CM

## 2023-11-08 DIAGNOSIS — L239 Allergic contact dermatitis, unspecified cause: Secondary | ICD-10-CM | POA: Diagnosis not present

## 2023-11-08 MED ORDER — TRIAMCINOLONE ACETONIDE 0.025 % EX OINT: 1.0000 | TOPICAL_OINTMENT | Freq: Two times a day (BID) | CUTANEOUS | 3 refills | Status: DC

## 2023-11-08 MED ORDER — TACROLIMUS 0.1 % EX OINT: TOPICAL_OINTMENT | Freq: Two times a day (BID) | CUTANEOUS | 0 refills | Status: DC

## 2023-11-08 NOTE — Progress Notes (Signed)
   New Patient Visit   Subjective  Shirley [name] is a 48 y.o. female who presents for the following: Dermatitis  Patient states she has Dermatitis located at the lips that she would like to have examined. Patient reports the areas have been there for 2 years. She reports the areas are not bothersome. Patient rates irritation 0 out of 10. She states that the areas have not spread. Patient reports she has previously been treated for these areas with Valcyclovir for cold sores. Patient denies Hx of bx. Patient denies family history of skin cancer(s).  The following portions of the chart were reviewed this encounter and updated as appropriate: medications, allergies, medical history  Review of Systems:  No other skin or systemic complaints except as noted in HPI or Assessment and Plan.  Objective  Well appearing patient in no apparent distress; mood and affect are within normal limits.  A focused examination was performed of the following areas: Lips  Relevant exam findings are noted in the Assessment and Plan.         Assessment & Plan   Contact Dermatitis  Exam: Scaly pink papules coalescing to plaques on lips  Treatment Plan: - We will prescribe TMC 0.025% Ointment to apply directly to the lips 2 times daily for 2 weeks then STOP - We will prescribe Tacrolimus 0.1% Ointment to apply to lips 2 times daily for 2 weeks then STOP - Advised to continue to alternate until she follows up in 2 months - Recommended to apply Aquaphor Healing Balm Stick daily PRN to keep lips hydrated    POST-INFLAMMATORY HYPoPIGMENTATION (PIH) Exam: Hypopigmented macules and/or patches at lips   Tacrolimus ointment after 2 weeks, to be used for 2 months to address residual inflammation and promote repigmentation.    Recommend Aquaphor Body Balm stick for lip hydration during the day.    Advise against the use of any lip products containing dyes or potential irritants.    Instruct patient to  avoid sun exposure to the affected area.   Schedule a follow-up appointment in 2.5 months to assess improvement and take pictures.   ALLERGIC CONTACT DERMATITIS, UNSPECIFIED TRIGGER   Related Medications triamcinolone (KENALOG) 0.025 % ointment Apply 1 Application topically 2 (two) times daily. tacrolimus (PROTOPIC) 0.1 % ointment Apply topically 2 (two) times daily.  Return in about 2 months (around 01/06/2024) for Contact Dermatitist F/U.  Documentation: I have reviewed the above documentation for accuracy and completeness, and I agree with the above.  Stasia Cavalier, am acting as scribe for Langston Reusing, DO.  Langston Reusing, DO

## 2023-11-08 NOTE — Patient Instructions (Addendum)
Hello [name],  Thank you for visiting my office today. Your dedication to addressing your dermatological concerns and improving your health is greatly appreciated. Here is a summary of the key instructions from today's consultation:  Diagnosis: Irritant Contact Dermatitis of the Lips  Medications Prescribed:   Triamcinolone 0.025% Ointment: Apply twice daily for 2 weeks to reduce inflammation and irritation.   Tacrolimus Ointment: Begin after completing Triamcinolone treatment, and continue for 2 months to aid in pigment restoration.  Skincare Products:   Use only plain Vaseline or Aquaphor Body Balm for lip hydration to avoid further irritation.   Avoid products containing red dye and other known irritants.  Lifestyle Adjustments:   Limit exposure to potential allergens in cosmetics and skincare products.   Monitor your diet to avoid foods that may exacerbate inflammation.  Follow-Up Appointment:   Schedule a return visit in approximately 2.5 months to assess progress and pigment restoration.  Please start with the prescribed treatments as instructed and observe how your lips respond. If you have any questions or concerns before our next appointment, please do not hesitate to contact the office.  Warm regards,  Dr. Langston Reusing [facility]    Important Information   Due to recent changes in healthcare laws, you may see results of your pathology and/or laboratory studies on MyChart before the doctors have had a chance to review them. We understand that in some cases there may be results that are confusing or concerning to you. Please understand that not all results are received at the same time and often the doctors may need to interpret multiple results in order to provide you with the best plan of care or course of treatment. Therefore, we ask that you please give [name] 2 business days to thoroughly review all your results before contacting the office for clarification. Should  we see a critical lab result, you will be contacted sooner.     If You Need Anything After Your Visit   If you have any questions or concerns for your doctor, please call our main line at [phone]. If no one answers, please leave a voicemail as directed and we will return your call as soon as possible. Messages left after 4 pm will be answered the following business day.    You may also send [name] a message via MyChart. We typically respond to MyChart messages within 1-2 business days.  For prescription refills, please ask your pharmacy to contact our office. Our fax number is [phone].  If you have an urgent issue when the clinic is closed that cannot wait until the next business day, you can page your doctor at the number below.     Please note that while we do our best to be available for urgent issues outside of office hours, we are not available 24/7.    If you have an urgent issue and are unable to reach [name], you may choose to seek medical care at your doctor's office, retail clinic, urgent care center, or emergency room.   If you have a medical emergency, please immediately call 911 or go to the emergency department. In the event of inclement weather, please call our main line at [phone] for an update on the status of any delays or closures.  Dermatology Medication Tips: Please keep the boxes that topical medications come in in order to help keep track of the instructions about where and how to use these. Pharmacies typically print the medication instructions only on the boxes and  not directly on the medication tubes.   If your medication is too expensive, please contact our office at [phone] or send [name] a message through MyChart.    We are unable to tell what your co-pay for medications will be in advance as this is different depending on your insurance coverage. However, we may be able to find a substitute medication at lower cost or fill out paperwork to get insurance  to cover a needed medication.    If a prior authorization is required to get your medication covered by your insurance company, please allow [name] 1-2 business days to complete this process.   Drug prices often vary depending on where the prescription is filled and some pharmacies may offer cheaper prices.   The website www.goodrx.com contains coupons for medications through different pharmacies. The prices here do not account for what the cost may be with help from insurance (it may be cheaper with your insurance), but the website can give you the price if you did not use any insurance.  - You can print the associated coupon and take it with your prescription to the pharmacy.  - You may also stop by our office during regular business hours and pick up a GoodRx coupon card.  - If you need your prescription sent electronically to a different pharmacy, notify our office through [facility] or by phone at [phone]

## 2023-11-19 ENCOUNTER — Ambulatory Visit: Payer: Medicaid Other | Admitting: Medical

## 2023-11-19 ENCOUNTER — Telehealth: Payer: Self-pay

## 2023-11-19 NOTE — Telephone Encounter (Signed)
No show letter

## 2023-11-20 ENCOUNTER — Telehealth: Payer: Self-pay | Admitting: Medical

## 2023-11-20 NOTE — Telephone Encounter (Signed)
Copied from CRM [id number]. Topic: Appointments - Appointment Info/Confirmation >> Nov 20, 2023  3:42 PM Theodis Sato wrote: Patient states her she cancelled her appointment on 1/27 with Esperanza Richters but in MyChart this is showing as a "No Show" Patient is requesting this to be fixed as she canceled in time.

## 2023-11-21 NOTE — Telephone Encounter (Signed)
Dont see anything in Epic where pt cancelled , will keep no show

## 2024-01-08 ENCOUNTER — Encounter: Payer: Self-pay | Admitting: Dermatology

## 2024-01-08 ENCOUNTER — Ambulatory Visit: Payer: Medicaid Other | Admitting: Dermatology

## 2024-01-08 VITALS — BP 118/79 | HR 90

## 2024-01-08 DIAGNOSIS — L239 Allergic contact dermatitis, unspecified cause: Secondary | ICD-10-CM | POA: Diagnosis not present

## 2024-01-08 DIAGNOSIS — L819 Disorder of pigmentation, unspecified: Secondary | ICD-10-CM

## 2024-01-08 DIAGNOSIS — L818 Other specified disorders of pigmentation: Secondary | ICD-10-CM | POA: Diagnosis not present

## 2024-01-08 NOTE — Progress Notes (Unsigned)
   Follow-Up Visit   Subjective  Shirley [name] is a 48 y.o. female who presents for the following: Allergic Contact Dermatis  Patient present today for follow up visit for Allergic Contact Dermatis. Patient was last evaluated on 11/08/23. At this visit patient was prescribed TMC 0.025% Ointment to apply directly to the lips 2 times daily for 2 weeks then STOP Alternating with Tacrolimus 0.1% Ointment to apply to lips 2 times daily for 2 weeks then STOP. She was also recommended to apply the Aquaphor Body balm stick daily to keep the lips moisturized. She has been alternating TMC 0.025% and tacrolimus. She is currently using tacrolimus and regular Aquaphor. Patient reports sxs are  improving but not at goal . Patient denies medication changes.  The following portions of the chart were reviewed this encounter and updated as appropriate: medications, allergies, medical history  Review of Systems:  No other skin or systemic complaints except as noted in HPI or Assessment and Plan.  Objective  Well appearing patient in no apparent distress; mood and affect are within normal limits.  A focused examination was performed of the following areas: Lips  Relevant exam findings are noted in the Assessment and Plan.     Assessment & Plan   ALLERGIC CONTACT DERMATITIS Exam: scaly pink plaques +/- vesiculation  Improving but not at goal  Treatment Plan: - Recommended taking a break from [facility] 0.025% until next follow up - Recommended continuing Tacrolimus ointment daily - Recommended moisturizing with Aquaphor body balm - We will plan to follow up in 3 months to re-assess    Return in about 3 months (around 04/09/2024) for Allergic Contact Dermatitis F/U.  Documentation: I have reviewed the above documentation for accuracy and completeness, and I agree with the above.  Stasia Cavalier, am acting as scribe for Langston Reusing, DO.  Langston Reusing, DO

## 2024-01-08 NOTE — Patient Instructions (Addendum)
 Hello Ms. Jodene,  Thank you for visiting my office today. I appreciate your dedication to improving your health and managing your skin condition. Here are the key instructions from our discussion:  - Triamcinolone: Discontinue the use of triamcinolone ointment.  - Tacrolimus: Continue using tacrolimus ointment daily, applying it at least once a day, and twice if possible.  - Lip Care: Try using Aquaphor body balm for your lips, available at [employer], to help with the dryness and irritation.  - Diet Recommendations:    - Focus on increasing intake of greens and darkly pigmented fruits   - Consider incorporating more plant-based foods   - Reduce soda consumption   - Be cautious with consumption of floral teas as they can cause irritation  - Follow-Up: Schedule a follow-up appointment in 3 months to assess progress.  It's great to hear that you are making positive changes in your diet and lifestyle. Keep up the good work, and I look forward to seeing the continued improvement in your next visit.  Warm regards,  Dr. Langston Reusing, Dermatology   Important Information   Due to recent changes in healthcare laws, you may see results of your pathology and/or laboratory studies on MyChart before the doctors have had a chance to review them. We understand that in some cases there may be results that are confusing or concerning to you. Please understand that not all results are received at the same time and often the doctors may need to interpret multiple results in order to provide you with the best plan of care or course of treatment. Therefore, we ask that you please give [name] 2 business days to thoroughly review all your results before contacting the office for clarification. Should we see a critical lab result, you will be contacted sooner.     If You Need Anything After Your Visit   If you have any questions or concerns for your doctor, please call our main line at [phone]. If no one  answers, please leave a voicemail as directed and we will return your call as soon as possible. Messages left after 4 pm will be answered the following business day.    You may also send [name] a message via MyChart. We typically respond to MyChart messages within 1-2 business days.  For prescription refills, please ask your pharmacy to contact our office. Our fax number is [phone].  If you have an urgent issue when the clinic is closed that cannot wait until the next business day, you can page your doctor at the number below.     Please note that while we do our best to be available for urgent issues outside of office hours, we are not available 24/7.    If you have an urgent issue and are unable to reach [name], you may choose to seek medical care at your doctor's office, retail clinic, urgent care center, or emergency room.   If you have a medical emergency, please immediately call 911 or go to the emergency department. In the event of inclement weather, please call our main line at [phone] for an update on the status of any delays or closures.  Dermatology Medication Tips: Please keep the boxes that topical medications come in in order to help keep track of the instructions about where and how to use these. Pharmacies typically print the medication instructions only on the boxes and not directly on the medication tubes.   If your medication is too expensive, please contact our office  at [phone] or send [name] a message through MyChart.    We are unable to tell what your co-pay for medications will be in advance as this is different depending on your insurance coverage. However, we may be able to find a substitute medication at lower cost or fill out paperwork to get insurance to cover a needed medication.    If a prior authorization is required to get your medication covered by your insurance company, please allow [name] 1-2 business days to complete this process.   Drug prices often vary  depending on where the prescription is filled and some pharmacies may offer cheaper prices.   The website www.goodrx.com contains coupons for medications through different pharmacies. The prices here do not account for what the cost may be with help from insurance (it may be cheaper with your insurance), but the website can give you the price if you did not use any insurance.  - You can print the associated coupon and take it with your prescription to the pharmacy.  - You may also stop by our office during regular business hours and pick up a GoodRx coupon card.  - If you need your prescription sent electronically to a different pharmacy, notify our office through [facility] or by phone at [phone]

## 2024-02-11 ENCOUNTER — Encounter: Payer: Medicaid Other | Admitting: Medical

## 2024-03-05 ENCOUNTER — Ambulatory Visit: Admitting: Sports Medicine

## 2024-03-05 ENCOUNTER — Encounter: Payer: Self-pay | Admitting: Sports Medicine

## 2024-03-05 VITALS — BP 120/74 | Ht 64.0 in | Wt 179.0 lb

## 2024-03-05 DIAGNOSIS — M5416 Radiculopathy, lumbar region: Secondary | ICD-10-CM | POA: Diagnosis not present

## 2024-03-05 MED ORDER — KETOROLAC TROMETHAMINE 60 MG/2ML IM SOLN
60.0000 mg | Freq: Once | INTRAMUSCULAR | Status: AC
  Administered 2024-03-05: 60 mg via INTRAMUSCULAR

## 2024-03-05 MED ORDER — METHYLPREDNISOLONE ACETATE 40 MG/ML IJ SUSP
40.0000 mg | Freq: Once | INTRAMUSCULAR | Status: AC
  Administered 2024-03-05: 80 mg via INTRAMUSCULAR

## 2024-03-05 NOTE — Progress Notes (Signed)
   Subjective:    Patient ID: Shirley [name], female    DOB: [birth year], 48 y.o.   MRN: [id number]  HPI chief complaint: Hip pain  Patient is a very pleasant 48 year old female that presents today with acute on chronic posterior hip pain.  She has a long history of lumbar radiculopathy and low back pain.  She is status post decompression and discectomy at L5-S1 approximate 20 years ago.  She will occasionally get pain in each buttock and will radiate down into her legs.  This is usually tolerable but has gotten worse over the past couple of weeks.  Pain is much worse with sitting such as with driving.  She finds that she has to change position frequently.  An MRI of her lumbar spine done in 2021 showed some facet arthropathy and a small disc bulge at L4-L5.  Her symptoms are much better with standing and walking.  She has tried several prescription strength NSAIDs in the past which have not worked.  She has also tried gabapentin  which was not helpful.  Past medical history reviewed Medications reviewed Allergies reviewed  Review of Systems As above    Objective:   Physical Exam  Well-developed, well-nourished.  No acute distress.  Examination of her hip shows good hip range of motion.  She is tender to palpation along the piriformis muscles bilaterally.  No midline tenderness.  Reflexes are equal in both lower extremities.  Strength is 5/5 in both lower extremities.      Assessment & Plan:   Acute on chronic low back pain with lumbar radiculopathy Status post remote L5-S1 decompression and discectomy  60 mg IM Toradol  and 80 mg IM Toradol  were administered today.  She has purchased some Golden Berberine which is an over-the-counter supplement that she is planning on trying for her pain.  She has also had success with physical therapy in the past.  We will refer her back to physical therapy and I will see her for follow-up in 6 weeks.  Over the years, she has lost a lot of weight and  has adapted a very healthy lifestyle including a change in her diet.  She really enjoys walking and would like to get back to that eventually.  This note was dictated using Dragon naturally speaking software and may contain errors in syntax, spelling, or content which have not been identified prior to signing this note.

## 2024-03-31 ENCOUNTER — Ambulatory Visit: Attending: Sports Medicine

## 2024-03-31 ENCOUNTER — Other Ambulatory Visit: Payer: Self-pay

## 2024-03-31 DIAGNOSIS — M5416 Radiculopathy, lumbar region: Secondary | ICD-10-CM | POA: Diagnosis not present

## 2024-03-31 DIAGNOSIS — M5441 Lumbago with sciatica, right side: Secondary | ICD-10-CM | POA: Diagnosis not present

## 2024-03-31 DIAGNOSIS — M5442 Lumbago with sciatica, left side: Secondary | ICD-10-CM | POA: Insufficient documentation

## 2024-03-31 DIAGNOSIS — M546 Pain in thoracic spine: Secondary | ICD-10-CM | POA: Diagnosis not present

## 2024-03-31 NOTE — Therapy (Signed)
 OUTPATIENT PHYSICAL THERAPY THORACOLUMBAR EVALUATION   Patient Name: Shirley [name] MRN: [id number] DOB:Sep 24, 1976, 48 y.o., female Today's Date: 03/31/2024  END OF SESSION:  PT End of Session - 03/31/24 0848     Visit Number 1    Date for PT Re-Evaluation 06/23/24    Progress Note Due on Visit 10    PT Start Time 0846    PT Stop Time 0930    PT Time Calculation (min) 44 min    Activity Tolerance Patient tolerated treatment well    Behavior During Therapy [facility] for tasks assessed/performed             Past Medical History:  Diagnosis Date   Achilles tendinitis of right lower extremity 04/07/2021   ADHD (attention deficit hyperactivity disorder)    ANEMIA-IRON DEFICIENCY 06/26/2008   Ankle impingement syndrome, right 03/01/2021   BIPOLAR DISORDER UNSPECIFIED 09/13/2007   Cervicalgia 10/15/2007   Chronic pain syndrome 03/21/2011   Contracture of right Achilles tendon 03/28/2021   COVID-19 virus infection 11/24/2020   DYSPNEA 12/05/2007   ELEVATED BP READING WITHOUT DX HYPERTENSION 07/29/2009   Encounter for long-term (current) use of high-risk medication 05/29/2011   Fibromyalgia 06/26/2008   Qualifier: Diagnosis of  By: Autry Legions MD, Alveda Aures    GERD 06/26/2008   Hyperlipidemia 09/13/2007   Qualifier: Diagnosis of  By: Marthe Slain    JOINT EFFUSION, RIGHT KNEE 07/29/2009   Low back pain radiating to right leg 12/01/2015   Lumbar radiculopathy 08/30/2021   Metatarsalgia of right foot 03/01/2021   MRSA (methicillin resistant staph aureus) urine culture positive on 07/06/16 07/06/2016   Treated with doxycycline    Neck pain 10/15/2007   Centricity Description: NECK PAIN Qualifier: Diagnosis of  By: Marthe Slain  Centricity Description: CERVICALGIA Qualifier: Diagnosis of  By: Autry Legions MD, Alveda Aures    Nondisplaced fracture of middle phalanx of right index finger, initial encounter for closed fracture 05/25/2022   Patellar tendinitis of left knee 08/30/2021   Plantar fasciitis of right  foot 03/17/2021   Prepatellar bursitis of left knee 08/30/2021   Shoulder impingement syndrome, left 11/07/2013   SINUSITIS- ACUTE-NOS 07/29/2009   Thoracic back pain 12/20/2015   THYROID  NODULE, RIGHT 11/20/2008   Toe injury, left, initial encounter 05/22/2019   Type 2 diabetes mellitus without complication, without long-term current use of insulin (HCC) 06/05/2007   Qualifier: Diagnosis of  By: Marilyn Shropshire     Past Surgical History:  Procedure Laterality Date   back surgury     lumbar 2001   CESAREAN SECTION     x 3   thyroid  fine needle aspiration  May 2008   showed non neoplastic goiter   VAGINAL HYSTERECTOMY     fibroids   Patient Active Problem List   Diagnosis Date Noted   Anxiety 05/18/2023   Fatty liver 05/10/2023   Chronic idiopathic constipation 05/10/2023   Acute anal fissure 05/10/2023   Scapular dysfunction 03/16/2023   Adhesive capsulitis of finger 12/22/2022   Hypertension associated with diabetes (HCC) 08/01/2022   Patellar tendinitis of left knee 08/30/2021   Prepatellar bursitis of left knee 08/30/2021   Lumbar radiculopathy 08/30/2021   Achilles tendinitis of right lower extremity 04/07/2021   Contracture of right Achilles tendon 03/28/2021   Plantar fasciitis of right foot 03/17/2021   ADHD (attention deficit hyperactivity disorder)    MRSA (methicillin resistant staph aureus) urine culture positive on 07/06/16 07/06/2016   Thoracic back pain 12/20/2015   Shoulder impingement  syndrome, left 11/07/2013   Chronic pain syndrome 03/21/2011   THYROID  NODULE, RIGHT 11/20/2008   GERD 06/26/2008   Fibromyalgia 06/26/2008   ANEMIA-IRON DEFICIENCY 06/26/2008   Hyperlipidemia associated with type 2 diabetes mellitus (HCC) 09/13/2007   Type 2 diabetes mellitus without complication, without long-term current use of insulin (HCC) 06/05/2007    PCP: Barron Borg PROVIDER: Neida Balloon  REFERRING DIAG: lumbar radiculopathy  Rationale for  Evaluation and Treatment: Rehabilitation  THERAPY DIAG:  Acute bilateral low back pain with right-sided sciatica - Plan: PT plan of care cert/re-cert  Acute bilateral low back pain with left-sided sciatica - Plan: PT plan of care cert/re-cert  Thoracic spine pain - Plan: PT plan of care cert/re-cert  ONSET DATE: 6-8 weeks, May 2025  SUBJECTIVE:                                                                                                                                                                                           SUBJECTIVE STATEMENT: Injection at MD office made a huge difference but didn't take pain away, made me feel like a normal functioning person again.  It wore off and after about a week or so the pain starting coming back some but not as intense as it was.  PERTINENT HISTORY:  New onset B lumbar radiculopathy, was evaluated by sports med MD and referred to PT  PAIN:  Are you having pain? Yes B post SI region and gluteals, more so on R, worse with sitting, rates 2 to 6/10, the shot helped  PRECAUTIONS: None  RED FLAGS: None   WEIGHT BEARING RESTRICTIONS: No  FALLS:  Has patient fallen in last 6 months? 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  NEXT MD VISIT: 04/09/24  OBJECTIVE:  Note: Objective measures were completed at Evaluation unless otherwise noted.  DIAGNOSTIC FINDINGS:   na  PATIENT SURVEYS:  Modified Oswestry Modified Oswestry Low Back Pain Disability Questionnaire: 20 / 50 = 40.0 %   COGNITION: Overall cognitive status: Within functional limits for tasks assessed     SENSATION: WFL  MUSCLE LENGTH: Hamstrings: Right wnl deg; Left wnl deg Prone knee bend test:  Right loss of 25% deg; Left wnl deg  POSTURE: symmetrical iliac crests B, B genu recurvatum, mid thoracic kyphosis, loss of muscle mass B gluteals  PALPATION: Exquisitely tender B hip lateral  trochanter, B glut medius musculature THORACIC SPINE: limited R rotation, ext, flex by 40%  LUMBAR ROM:   AROM eval  Flexion 60 P! Mid thoracic  Extension wfl  Right lateral flexion 50  Left lateral flexion 50  Right rotation   Left rotation    (Blank rows = not tested)  LOWER EXTREMITY ROM:     Active  Right eval Left eval  Hip flexion 105 120  Hip extension    Hip abduction    Hip adduction    Hip internal rotation 10 p! 10 p!  Hip external rotation    Knee flexion    Knee extension    Ankle dorsiflexion    Ankle plantarflexion    Ankle inversion    Ankle eversion     (Blank rows = wfl)  LOWER EXTREMITY MMT:    MMT Right eval Left eval  Hip flexion 4 4  Hip extension lock bridge 3- 3-  Hip abduction 4+ 4+  Hip adduction    Hip internal rotation    Hip external rotation 5 5  Knee flexion    Knee extension wfl wfl  Ankle dorsiflexion wfl wfl  Ankle plantarflexion wfl wfl  Ankle inversion    Ankle eversion     (Blank rows = not tested)  UE MMT:  B shoulder flexion, abd 4+/5 B middle traps, lower traps 3-/5 Shoulder IR/ ER B wnl   LUMBAR SPECIAL TESTS:  Straight leg raise test: Positive, Stork standing: Negative, and FABER test: Negative  FUNCTIONAL TESTS:  30 seconds chair stand test  TBD  GAIT: Distance walked: in clinic, no deficits noted, no device, over 70' at a time  TREATMENT DATE: 03/31/24: Evaluation, instructed the pt in therex as listed below to initiate motor retraining, stabilization ex.   Advised not to train through pain, to work up from 1 x day to 2 x day.   PATIENT EDUCATION:  Education details: POC, goals Person educated: Patient Education method: Explanation, Demonstration, Tactile cues, Verbal cues, and Handouts Education  comprehension: verbalized understanding, returned demonstration, verbal cues required, and tactile cues required  HOME EXERCISE PROGRAM: Access Code: [email or web address] URL: [email or web address] Date: 03/31/2024 Prepared by: Shaaron Dar  Exercises - Supine Bridge  - 2 x daily - 7 x weekly - 3 sets - 10 reps - Prone Shoulder Horizontal Abduction  - 2 x daily - 7 x weekly - 3 sets - 10 reps  ASSESSMENT:  CLINICAL IMPRESSION: Patient is a 48 y.o. female who was evaluated today for physical therapy due to acute onset of chronic B lower back pain.  She presents overall with normal lumbar ROM, some stiffness R hip flexion.  Also marked loss of mobility thoracic spine , has poor mechanics/ weakness with B shoulder elevation in upright position but wnl B shoulder movement in supine. Marked weakness B middle traps musculature. She also demonstrates loss of B gluteal mass and strength, especially for hip ext. She is tender with light palpation extending along spinous processes lower cervical, mid thoracic, and lumbar region. Initiated Tx today with motor control/ strengthening of her postural musculature, gluts and middle traps, which she tolerated well.  Will benefit from skilled PT to address her pain and assist her with long term strategies for pain management.   OBJECTIVE IMPAIRMENTS: decreased activity tolerance, decreased knowledge of condition, decreased strength, impaired perceived functional ability, and pain.   ACTIVITY LIMITATIONS: carrying, lifting, sitting, squatting, sleeping, bathing, dressing, and caring for others  PARTICIPATION LIMITATIONS: meal prep, cleaning,  laundry, driving, community activity, and occupation  PERSONAL FACTORS: Age, Behavior pattern, Education, Fitness, Past/current experiences, Profession, Time since onset of injury/illness/exacerbation, and 1-2 comorbidities: h/o prior lumbar surgery, DM are also affecting patient's functional outcome.   REHAB POTENTIAL:  Good  CLINICAL DECISION MAKING: Evolving/moderate complexity  EVALUATION COMPLEXITY: Moderate   GOALS: Goals reviewed with patient? Yes  SHORT TERM GOALS: Target date: 2 weeks, 04/14/24  I HEP  Baseline:initiated today Goal status: INITIAL  2.  Assess 30 sec sit to stand and set goal  LONG TERM GOALS: Target date: 12 weeks, 06/23/24  Full, normal ROM for lumbar forward bending Baseline: limited to 60 % with thoracic spine pain Goal status: INITIAL  2.  Full, normal B shoulder flex, abd  AROM Baseline: limited by 30% Goal status: INITIAL  3.  Strength B gluteals 5/5  Baseline: 3-/5 Goal status: INITIAL  4.  Modified Oswestry improve from 20/50 or 40% disability to 10/50 Baseline:  Goal status: INITIAL   PLAN:  PT FREQUENCY: 1-2x/week  PT DURATION: 10 weeks  PLANNED INTERVENTIONS: 97164- PT Re-evaluation, 97110-Therapeutic exercises, 97530- Therapeutic activity, [id number]- Neuromuscular re-education, 97535- Self Care, 86578- Manual therapy, 46962- Traction (mechanical), [id number]- Ionotophoresis 4mg /ml Dexamethasone , and 20560 (1-2 muscles), 20561 (3+ muscles)- Dry Needling.  PLAN FOR NEXT SESSION: how were initial exercises?  Needs some stretching, engagement and movement of mid thoracic and scapulae, needs progressive gluteal strengthening, light manual techniques   [place]unn Chancellor L Cale Bethard, PT, DPT, OCS 03/31/2024, 4:53 PM

## 2024-04-09 ENCOUNTER — Ambulatory Visit: Admitting: Sports Medicine

## 2024-04-09 ENCOUNTER — Ambulatory Visit

## 2024-04-10 ENCOUNTER — Ambulatory Visit: Admitting: Sports Medicine

## 2024-04-11 ENCOUNTER — Encounter

## 2024-04-15 ENCOUNTER — Ambulatory Visit

## 2024-04-17 ENCOUNTER — Encounter

## 2024-04-24 ENCOUNTER — Ambulatory Visit (INDEPENDENT_AMBULATORY_CARE_PROVIDER_SITE_OTHER): Admitting: Sports Medicine

## 2024-04-24 ENCOUNTER — Encounter: Payer: Self-pay | Admitting: Sports Medicine

## 2024-04-24 ENCOUNTER — Encounter

## 2024-04-24 VITALS — BP 128/88 | Ht 64.0 in | Wt 179.0 lb

## 2024-04-24 DIAGNOSIS — M5416 Radiculopathy, lumbar region: Secondary | ICD-10-CM

## 2024-04-24 MED ORDER — METHYLPREDNISOLONE ACETATE 40 MG/ML IJ SUSP
40.0000 mg | Freq: Once | INTRAMUSCULAR | Status: AC
  Administered 2024-04-24: 80 mg via INTRAMUSCULAR

## 2024-04-24 MED ORDER — KETOROLAC TROMETHAMINE 60 MG/2ML IM SOLN
60.0000 mg | Freq: Once | INTRAMUSCULAR | Status: AC
  Administered 2024-04-24: 60 mg via INTRAMUSCULAR

## 2024-04-24 MED ORDER — NAPROXEN 500 MG PO TABS: 500.0000 mg | ORAL_TABLET | Freq: Two times a day (BID) | ORAL | 1 refills | Status: DC

## 2024-04-24 NOTE — Progress Notes (Signed)
 Patient ID: Shirley [name], female   DOB: 1976-03-31, 48 y.o.   MRN: [id number]  Shirley [name] presents today with returning posterior right hip pain.  She was last seen in the office on May 14.  She had several weeks of pain relief after IM Depo-Medrol  and Toradol  injections that day.  She also began physical therapy on June 9 but unfortunately she has had to move away to deal with some family issues.  She does not anticipate returning to the [facility] area until sometime around September.  Pain does radiate down the right leg.  It is worse after prolonged sitting or lying in bed.  She has a well-documented history of a previous L5-S1 decompression.  An MRI in 2021 showed advanced degenerative disc disease at that level.  Physical exam shows limited lumbar range of motion secondary to pain.  Her pain is localized to the right lower lumbar spine.  She does not demonstrate any gross focal neurological deficit of either lower extremity.  Shirley [name] is in a difficult situation.  Since she did get good acute pain relief with her previous IM injections, we will repeat those today.  We also discussed a trial of Naprosyn  500 mg twice daily for her pain.  She will take this with food and is cautioned about GI upset.  Unfortunately, she will not be able to attend physical therapy, but she is going to contact her personal trainer who she has worked with for several years to arrange for video visits.  I did explain that eventually she may be faced with the ultimate decision to proceed with an L5-S1 fusion.  She understands that.  Follow-up as needed.  This note was dictated using Dragon naturally speaking software and may contain errors in syntax, spelling, or content which have not been identified prior to signing this note.

## 2024-07-01 ENCOUNTER — Encounter: Payer: Self-pay | Admitting: Dermatology

## 2024-07-01 ENCOUNTER — Ambulatory Visit: Admitting: Dermatology

## 2024-07-01 VITALS — BP 92/60 | HR 125

## 2024-07-01 DIAGNOSIS — R208 Other disturbances of skin sensation: Secondary | ICD-10-CM | POA: Diagnosis not present

## 2024-07-01 DIAGNOSIS — R748 Abnormal levels of other serum enzymes: Secondary | ICD-10-CM | POA: Insufficient documentation

## 2024-07-01 DIAGNOSIS — L309 Dermatitis, unspecified: Secondary | ICD-10-CM | POA: Diagnosis not present

## 2024-07-01 DIAGNOSIS — L239 Allergic contact dermatitis, unspecified cause: Secondary | ICD-10-CM

## 2024-07-01 MED ORDER — TRIAMCINOLONE ACETONIDE 0.025 % EX OINT: 1.0000 | TOPICAL_OINTMENT | Freq: Two times a day (BID) | CUTANEOUS | 3 refills | Status: DC

## 2024-07-01 NOTE — Progress Notes (Signed)
 Follow-Up Visit   Subjective  Shirley [name] is a 48 y.o. female who presents for the following: Allergic Contact Dermatitis  Patient present today for follow up visit for Allergic Contact Dermatitis. Patient was last evaluated on 12/29/23. At this visit patient was advised to continue tacrolimus  daily along with Aquaphor body balm for hydration. Patient reports she is still applying Tacrolimus  and Aquaphor body Balm Stick & Vasoline. She still feels as if her lips a thin. She accidentally hit her lip softly with her keys which cause her lips to burst and bleed and the area now has a scab that is taking a while to heal. She also reports she will get burning and irritation inside her mouth when she eats any mild foods so she avoids those foods. She reports her pcp put her on Valtrex  for a short period of time in the past because she has hx of cold sore but that did not help when she her previous lip irritation. Patient reports sxs are worse. Patient denies medication changes.  Patient also has a irritation on her arms. She states she did have eczema during her 2nd pregnancy many years ago. She has not attempt to put any of the creams on, she wanted to wait to be evaluated.  The following portions of the chart were reviewed this encounter and updated as appropriate: medications, allergies, medical history  Review of Systems:  No other skin or systemic complaints except as noted in HPI or Assessment and Plan.  Objective  Well appearing patient in no apparent distress; mood and affect are within normal limits.  A focused examination was performed of the following areas: Lips and Arms  Relevant exam findings are noted in the Assessment and Plan.      Assessment & Plan   1. Eczema (lips, arms, legs) - Assessment: Patient has a 20-year history of eczema, initially starting on the leg, then progressing to the head, and now affecting the lips and arms. The condition is characterized by  itching and scratching. Lip involvement has led to thinning of the lip tissue compared to the patient's baseline. Previous treatment with triamcinolone  improved the lip condition, but symptoms worsened when switched to tacrolimus . - Plan:    Initiate cyclic treatment for lips and arms: triamcinolone  for 2 weeks, followed by tacrolimus  for 2 weeks, continue this cycle until resolution    Apply Aquaphor balm to lips before eating    Avoid spicy foods    Use Eucerin Advanced Repair moisturizer followed by Palmer's moisturizer for skin hydration    Patient education: avoid licking or touching lips, keep mouth relaxed when at rest, carry Aquaphor at all times  2. Oral discomfort - Assessment: Patient reports oral discomfort, including a burning sensation when consuming mildly spicy foods such as black pepper. The discomfort is localized to specific areas in the mouth, particularly the jaw area on the inside. The patient also experiences a burning sensation if food touches the lips. Examination of the oral cavity appears normal with an appropriate amount of saliva present. The patient reports feeling of dryness in the mouth. - Plan:    Continue using Aquaphor balm on lips before eating    Avoid spicy and irritating foods    Use natural or [employer] toothpaste, avoiding flavored varieties    Monitor symptoms and reassess at follow-up appointment  Follow-up in 3 months.    Return in about 5 months (around 12/01/2024) for Allergic Contact Dermatitis F/U.  I, Jetta Ager, am  acting as scribe for [employer], DO.  Documentation: I have reviewed the above documentation for accuracy and completeness, and I agree with the above.  Delon Lenis, DO

## 2024-07-01 NOTE — Patient Instructions (Addendum)
 Date: Tue Jul 01 2024  Hello Gent,  Thank you for visiting today. Here is a summary of the key instructions:  - Medications:   - Use triamcinolone  on lips and arms for 2 weeks   - Switch to tacrolimus  on lips and arms for 2 weeks   - Repeat this 2-week cycle until symptoms improve  - Skin Care:   - Apply Aquaphor balm to lips before eating   - Use Excedrin Advanced Repair moisturizer on skin   - Apply Palmer's moisturizer on top of Excedrin Advanced Repair  - Follow-up:   - Return for follow-up appointment in 3 months  - Other Instructions:   - Stop licking or touching lips completely   - Avoid spicy foods   - Keep Aquaphor with you at all times  Please reach out if you have any questions or concerns.  Warm regards,  Dr. [facility]     Important Information   Due to recent changes in healthcare laws, you may see results of your pathology and/or laboratory studies on MyChart before the doctors have had a chance to review them. We understand that in some cases there may be results that are confusing or concerning to you. Please understand that not all results are received at the same time and often the doctors may need to interpret multiple results in order to provide you with the best plan of care or course of treatment. Therefore, we ask that you please give us  2 business days to thoroughly review all your results before contacting the office for clarification. Should we see a critical lab result, you will be contacted sooner.     If You Need Anything After Your Visit   If you have any questions or concerns for your doctor, please call our main line at [phone]. If no one answers, please leave a voicemail as directed and we will return your call as soon as possible. Messages left after 4 pm will be answered the following business day.    You may also send us  a message via MyChart. We typically respond to MyChart messages within 1-2 business  days.  For prescription refills, please ask your pharmacy to contact our office. Our fax number is [phone].  If you have an urgent issue when the clinic is closed that cannot wait until the next business day, you can page your doctor at the number below.     Please note that while we do our best to be available for urgent issues outside of office hours, we are not available 24/7.    If you have an urgent issue and are unable to reach us , you may choose to seek medical care at your doctor's office, retail clinic, urgent care center, or emergency room.   If you have a medical emergency, please immediately call 911 or go to the emergency department. In the event of inclement weather, please call our main line at [phone] for an update on the status of any delays or closures.  Dermatology Medication Tips: Please keep the boxes that topical medications come in in order to help keep track of the instructions about where and how to use these. Pharmacies typically print the medication instructions only on the boxes and not directly on the medication tubes.   If your medication is too expensive, please contact our office at [phone] or send us  a message through MyChart.    We are unable to tell what your co-pay for medications will be in  advance as this is different depending on your insurance coverage. However, we may be able to find a substitute medication at lower cost or fill out paperwork to get insurance to cover a needed medication.    If a prior authorization is required to get your medication covered by your insurance company, please allow us  1-2 business days to complete this process.   Drug prices often vary depending on where the prescription is filled and some pharmacies may offer cheaper prices.   The website www.goodrx.com contains coupons for medications through different pharmacies. The prices here do not account for what the cost may be with help from insurance (it may  be cheaper with your insurance), but the website can give you the price if you did not use any insurance.  - You can print the associated coupon and take it with your prescription to the pharmacy.  - You may also stop by our office during regular business hours and pick up a GoodRx coupon card.  - If you need your prescription sent electronically to a different pharmacy, notify our office through [facility] or by phone at [phone]

## 2024-07-02 DIAGNOSIS — L239 Allergic contact dermatitis, unspecified cause: Secondary | ICD-10-CM | POA: Diagnosis not present

## 2024-07-07 ENCOUNTER — Ambulatory Visit: Payer: Self-pay | Admitting: Dermatology

## 2024-07-07 LAB — AEROBIC CULTURE

## 2024-07-07 NOTE — Progress Notes (Signed)
 Hi Jetta,  Please send an rx for topical gentamicin  ointment to pt's pharmacy on file.  She should apply twice a day mixing with steroid ointment until her follow up appointment.   -Dr JONETTA

## 2024-07-08 ENCOUNTER — Other Ambulatory Visit: Payer: Self-pay

## 2024-07-08 DIAGNOSIS — L239 Allergic contact dermatitis, unspecified cause: Secondary | ICD-10-CM

## 2024-07-08 MED ORDER — GENTAMICIN SULFATE 0.1 % EX OINT: 1.0000 | TOPICAL_OINTMENT | Freq: Two times a day (BID) | CUTANEOUS | 9 refills | Status: DC

## 2024-07-08 MED ORDER — TRIAMCINOLONE ACETONIDE 0.025 % EX OINT: 1.0000 | TOPICAL_OINTMENT | Freq: Two times a day (BID) | CUTANEOUS | 3 refills | Status: DC

## 2024-07-24 ENCOUNTER — Ambulatory Visit: Admitting: Family Medicine

## 2024-07-24 VITALS — BP 151/94 | HR 93 | Wt 185.0 lb

## 2024-07-24 DIAGNOSIS — N952 Postmenopausal atrophic vaginitis: Secondary | ICD-10-CM | POA: Diagnosis not present

## 2024-07-24 DIAGNOSIS — N644 Mastodynia: Secondary | ICD-10-CM

## 2024-07-24 DIAGNOSIS — Z01419 Encounter for gynecological examination (general) (routine) without abnormal findings: Secondary | ICD-10-CM

## 2024-07-24 DIAGNOSIS — N898 Other specified noninflammatory disorders of vagina: Secondary | ICD-10-CM

## 2024-07-24 MED ORDER — CLOBETASOL PROPIONATE 0.05 % EX OINT: 1.0000 | TOPICAL_OINTMENT | Freq: Two times a day (BID) | CUTANEOUS | 2 refills | Status: DC

## 2024-07-25 NOTE — Progress Notes (Signed)
   Acute Office Visit  Subjective:     Patient ID: Shirley [name], female    DOB: 1976/07/01, 48 y.o.   MRN: [id number]  No chief complaint on file.   HPI Patient comes with intermittent breast tenderness for several weeks.  No masses palpated.  Some sensitivity bilaterally but mostly pain on the left side lateral.  In addition, she continues to have vaginal pain.  She has not been able to use the vaginal estrogen due to the amount of pain.  Even slight movements cause pain.  This has worsened since our last appointment.   ROS Per HPI      Objective:    BP (!) 151/94 (BP Location: Right Arm, Patient Position: Sitting, Cuff Size: Large)   Pulse 93   Wt 185 lb (83.9 kg)   BMI 31.76 kg/m    Physical Exam Vitals reviewed.  Constitutional:      Appearance: Normal appearance.  Chest:       Comments: No masses  Genitourinary:    Comments: Lacy rash over labia minor bilaterally, concerning for lichen sclerosis Skin:    Capillary Refill: Capillary refill takes less than 2 seconds.  Neurological:     General: No focal deficit present.     Mental Status: She is alert.  Psychiatric:        Mood and Affect: Mood normal.        Behavior: Behavior normal.        Thought Content: Thought content normal.        Judgment: Judgment normal.     No results found for any visits on 07/24/24.      Assessment & Plan:  1. Breast tenderness in female (Primary) Patient has mammogram scheduled.  Continue lumps palpated at this point.  2. Vaginal atrophy 3. Vaginal irritation Lacy rash and pain and sensitivity worrisome for lichen sclerosus.  Start clobetasol.  If not improving in the next 2 months, will do biopsy.     No orders of the defined types were placed in this encounter.    Meds ordered this encounter  Medications   clobetasol ointment (TEMOVATE) 0.05 %    Sig: Apply 1 Application topically 2 (two) times daily. Apply to affected area    Dispense:  30 g     Refill:  2    Return in about 8 weeks (around 88/72/7974) for ? lichen.  Sheppard Luckenbach J Martika Egler, DO

## 2024-08-08 ENCOUNTER — Other Ambulatory Visit (HOSPITAL_BASED_OUTPATIENT_CLINIC_OR_DEPARTMENT_OTHER): Payer: Self-pay | Admitting: Medical

## 2024-08-08 DIAGNOSIS — Z1231 Encounter for screening mammogram for malignant neoplasm of breast: Secondary | ICD-10-CM

## 2024-08-20 ENCOUNTER — Other Ambulatory Visit: Payer: Self-pay | Admitting: Family Medicine

## 2024-08-20 DIAGNOSIS — N644 Mastodynia: Secondary | ICD-10-CM

## 2024-08-20 DIAGNOSIS — Z1239 Encounter for other screening for malignant neoplasm of breast: Secondary | ICD-10-CM

## 2024-08-29 ENCOUNTER — Other Ambulatory Visit: Payer: Self-pay | Admitting: Family Medicine

## 2024-08-29 DIAGNOSIS — Z1239 Encounter for other screening for malignant neoplasm of breast: Secondary | ICD-10-CM

## 2024-08-29 DIAGNOSIS — N644 Mastodynia: Secondary | ICD-10-CM

## 2024-09-03 ENCOUNTER — Inpatient Hospital Stay: Admission: RE | Admit: 2024-09-03 | Discharge: 2024-09-03 | Attending: Family Medicine | Admitting: Family Medicine

## 2024-09-03 ENCOUNTER — Other Ambulatory Visit: Payer: Self-pay | Admitting: Family Medicine

## 2024-09-03 ENCOUNTER — Ambulatory Visit
Admission: RE | Admit: 2024-09-03 | Discharge: 2024-09-03 | Disposition: A | Discharge status: Home / Self Care | Source: Ambulatory Visit | Attending: Family Medicine | Admitting: Family Medicine

## 2024-09-03 DIAGNOSIS — N644 Mastodynia: Secondary | ICD-10-CM

## 2024-09-03 DIAGNOSIS — R928 Other abnormal and inconclusive findings on diagnostic imaging of breast: Secondary | ICD-10-CM | POA: Diagnosis not present

## 2024-09-03 DIAGNOSIS — Z1239 Encounter for other screening for malignant neoplasm of breast: Secondary | ICD-10-CM

## 2024-09-04 ENCOUNTER — Ambulatory Visit: Payer: Self-pay | Admitting: Family Medicine

## 2024-09-15 ENCOUNTER — Ambulatory Visit (HOSPITAL_BASED_OUTPATIENT_CLINIC_OR_DEPARTMENT_OTHER)

## 2024-09-24 ENCOUNTER — Ambulatory Visit: Admitting: Family Medicine

## 2024-11-20 ENCOUNTER — Ambulatory Visit: Admitting: Family Medicine

## 2024-11-20 VITALS — BP 145/86 | HR 97 | Ht 64.0 in | Wt 185.0 lb

## 2024-11-20 DIAGNOSIS — N898 Other specified noninflammatory disorders of vagina: Secondary | ICD-10-CM

## 2024-11-20 DIAGNOSIS — N952 Postmenopausal atrophic vaginitis: Secondary | ICD-10-CM

## 2024-11-20 DIAGNOSIS — I152 Hypertension secondary to endocrine disorders: Secondary | ICD-10-CM

## 2024-11-20 DIAGNOSIS — E119 Type 2 diabetes mellitus without complications: Secondary | ICD-10-CM

## 2024-11-20 MED ORDER — CARVEDILOL 12.5 MG PO TABS: 12.5000 mg | ORAL_TABLET | Freq: Two times a day (BID) | ORAL | 0 refills | Status: AC

## 2024-11-20 MED ORDER — PROGESTERONE 200 MG PO CAPS: 200.0000 mg | ORAL_CAPSULE | Freq: Every day | ORAL | 3 refills | Status: AC

## 2024-11-20 MED ORDER — PREMARIN 0.625 MG/GM VA CREA: 1.0000 | TOPICAL_CREAM | Freq: Every day | VAGINAL | 12 refills | Status: AC

## 2024-11-20 MED ORDER — FLUCONAZOLE 150 MG PO TABS: 150.0000 mg | ORAL_TABLET | ORAL | 0 refills | Status: AC

## 2024-11-20 NOTE — Progress Notes (Signed)
 "  Acute Office Visit  Subjective:     Patient ID: Shirley [name], female    DOB: 1975/11/21, 49 y.o.   MRN: [id number]  Chief Complaint  Patient presents with   Follow-up    HPI  Discussed the use of AI scribe software for clinical note transcription with the patient, who gave verbal consent to proceed.  History of Present Illness Shirley [name] is a 49 year old female who presents with concerns about a vulvar rash and its management.  She initially used a steroid cream prescribed during her last visit, but experienced worsening symptoms after starting the treatment. The rash initially appeared red with a white lacy pattern on the labia and vulva. After using the cream for about one to two weeks, she noticed a gray appearance and felt 'helpless' and 'scared'.  In response to her symptoms, she self-initiated a regimen of multiple probiotics, including AZO yeast probiotics taken three times a day, a refrigerated probiotic once daily, and another non-refrigerated probiotic. She also used a probiotic wash and Honey Pot pads with essential oils.  She expresses concern about internal dryness and mentions feeling 'delicate'. She has a history of irregular bowel movements, which have improved with increased water intake and dietary changes, including the addition of lemon and celery seeds to her water. She suspects dehydration due to past excessive soda consumption.  She has not had recent blood work and mentions a history of diabetes. She is not currently taking her blood pressure medication, Coreg , due to running out and her doctor moving. She plans to see a new doctor soon. She also reports a history of stress and anxiety related to her previous job, which she has since left.     ROS Per HPI      Objective:    BP (!) 145/86 (BP Location: Left Arm, Patient Position: Sitting, Cuff Size: Large)   Pulse 97   Ht 5' 4 (1.626 m)   Wt 185 lb (83.9 kg)   BMI 31.76 kg/m     Physical Exam Vitals reviewed. Exam conducted with a chaperone present.  Constitutional:      Appearance: Normal appearance.  Genitourinary:    Comments: Previous lacy rash all cleared up. Normal atrophy present. Neurological:     General: No focal deficit present.     Mental Status: She is alert.  Psychiatric:        Mood and Affect: Mood normal.        Behavior: Behavior normal.        Thought Content: Thought content normal.        Judgment: Judgment normal.     No results found for any visits on 11/20/24.      Assessment & Plan:   Assessment and Plan Assessment & Plan 1. Vaginal atrophy Initial steroid treatment ineffective. Self-treatment with probiotics, probiotic wash, and tea tree oil pads improved symptoms. Examination normal. Suspected candidiasis with persistent dryness and discomfort. Self-treatment may have improved symptoms. - Prescribed Diflucan  (fluconazole ) 150 mg, one tablet every three days for three doses.  2. Vaginal irritation (Primary) - Hemoglobin A1c  3. Type 2 diabetes mellitus without complication, without long-term current use of insulin (HCC) Recheck - last checked in 2022. - Hemoglobin A1c  4. Hypertension associated with diabetes (HCC) Last A1c in 2022. Not taking Coreg  due to provider change. - Checked A1c level. - Refilled Coreg  (carvedilol ) for 90 days. - Hemoglobin A1c       No orders of the  defined types were placed in this encounter.    No orders of the defined types were placed in this encounter.   No follow-ups on file.  Cuong Moorman J Arthuro Canelo, DO  "

## 2024-11-21 ENCOUNTER — Ambulatory Visit: Payer: Self-pay | Admitting: Family Medicine

## 2024-11-21 LAB — HEMOGLOBIN A1C
Est. average glucose Bld gHb Est-mCnc: 329 mg/dL
Hgb A1c MFr Bld: 13.1 % — ABNORMAL HIGH (ref 4.8–5.6)

## 2024-11-24 MED ORDER — SITAGLIPTIN PHOSPHATE 100 MG PO TABS: 100.0000 mg | ORAL_TABLET | Freq: Every day | ORAL | 0 refills | Status: AC

## 2024-11-26 ENCOUNTER — Telehealth: Payer: Self-pay | Admitting: Pharmacy Technician

## 2024-11-26 ENCOUNTER — Other Ambulatory Visit (HOSPITAL_COMMUNITY): Payer: Self-pay

## 2024-11-26 NOTE — Telephone Encounter (Signed)
 Pharmacy Patient Advocate Encounter  Received notification from [employer] that Prior Authorization for Januvia  100MG  tablets has been APPROVED from 11/24/2024 to 11/24/2025. Ran test claim, Copay is $4.00. This test claim was processed through [facility]- copay amounts may vary at other pharmacies due to pharmacy/plan contracts, or as the patient moves through the different stages of their insurance plan.   PA #/Case ID/Reference #: [id number]

## 2024-12-03 ENCOUNTER — Ambulatory Visit: Admitting: Dermatology
